# Patient Record
Sex: Female | Born: 1935 | Race: White | Hispanic: No | Smoking: Former smoker
Health system: Southern US, Community
[De-identification: ages and names within clinical notes are randomized; demographics above are authoritative.]

## PROBLEM LIST (undated history)

## (undated) DIAGNOSIS — J45909 Unspecified asthma, uncomplicated: Secondary | ICD-10-CM

## (undated) DIAGNOSIS — I219 Acute myocardial infarction, unspecified: Secondary | ICD-10-CM

## (undated) DIAGNOSIS — J449 Chronic obstructive pulmonary disease, unspecified: Secondary | ICD-10-CM

## (undated) DIAGNOSIS — I509 Heart failure, unspecified: Secondary | ICD-10-CM

## (undated) HISTORY — PX: ABDOMINAL HYSTERECTOMY: SHX81

## (undated) HISTORY — DX: Heart failure, unspecified: I50.9

## (undated) HISTORY — PX: BREAST SURGERY: SHX581

## (undated) HISTORY — PX: TONSILLECTOMY: SUR1361

## (undated) HISTORY — PX: CHOLECYSTECTOMY: SHX55

## (undated) HISTORY — PX: CORONARY ARTERY BYPASS GRAFT: SHX141

---

## 2005-01-14 ENCOUNTER — Emergency Department: Payer: Self-pay | Admitting: Emergency Medicine

## 2005-09-10 HISTORY — PX: BYPASS GRAFT: SHX909

## 2005-10-08 ENCOUNTER — Ambulatory Visit (HOSPITAL_COMMUNITY): Admission: RE | Admit: 2005-10-08 | Discharge: 2005-10-08 | Payer: Self-pay | Admitting: Family Medicine

## 2005-10-22 ENCOUNTER — Ambulatory Visit: Payer: Self-pay | Admitting: Oncology

## 2005-11-02 ENCOUNTER — Ambulatory Visit: Payer: Self-pay | Admitting: Internal Medicine

## 2005-11-06 ENCOUNTER — Ambulatory Visit: Payer: Self-pay | Admitting: Otolaryngology

## 2005-11-06 ENCOUNTER — Other Ambulatory Visit: Payer: Self-pay

## 2005-11-15 ENCOUNTER — Ambulatory Visit: Payer: Self-pay | Admitting: Otolaryngology

## 2006-03-24 ENCOUNTER — Inpatient Hospital Stay: Payer: Self-pay | Admitting: Cardiovascular Disease

## 2006-03-24 ENCOUNTER — Other Ambulatory Visit: Payer: Self-pay

## 2006-05-17 ENCOUNTER — Ambulatory Visit: Payer: Self-pay | Admitting: Oncology

## 2006-12-02 ENCOUNTER — Ambulatory Visit: Payer: Self-pay | Admitting: Oncology

## 2006-12-03 LAB — BASIC METABOLIC PANEL - CANCER CENTER ONLY
BUN, Bld: 19 mg/dL (ref 7–22)
Chloride: 102 mEq/L (ref 98–108)
Potassium: 5.2 mEq/L — ABNORMAL HIGH (ref 3.3–4.7)
Sodium: 136 mEq/L (ref 128–145)

## 2006-12-03 LAB — CBC WITH DIFFERENTIAL (CANCER CENTER ONLY)
BASO#: 0.1 10*3/uL (ref 0.0–0.2)
Eosinophils Absolute: 0.1 10*3/uL (ref 0.0–0.5)
LYMPH#: 4.4 10*3/uL — ABNORMAL HIGH (ref 0.9–3.3)
MCV: 105 fL — ABNORMAL HIGH (ref 81–101)
NEUT%: 45.6 % (ref 39.6–80.0)
Platelets: 257 10*3/uL (ref 145–400)
RBC: 4.48 10*6/uL (ref 3.70–5.32)
RDW: 11.7 % (ref 10.5–14.6)

## 2006-12-09 ENCOUNTER — Ambulatory Visit (HOSPITAL_COMMUNITY): Admission: RE | Admit: 2006-12-09 | Discharge: 2006-12-09 | Payer: Self-pay | Admitting: Oncology

## 2006-12-21 ENCOUNTER — Inpatient Hospital Stay: Payer: Self-pay | Admitting: Internal Medicine

## 2006-12-21 ENCOUNTER — Other Ambulatory Visit: Payer: Self-pay

## 2007-07-22 ENCOUNTER — Ambulatory Visit: Payer: Self-pay | Admitting: Oncology

## 2007-07-24 LAB — COMPREHENSIVE METABOLIC PANEL
Albumin: 4.3 g/dL (ref 3.5–5.2)
Alkaline Phosphatase: 74 U/L (ref 39–117)
Calcium: 9.3 mg/dL (ref 8.4–10.5)
Chloride: 105 mEq/L (ref 96–112)
Glucose, Bld: 103 mg/dL — ABNORMAL HIGH (ref 70–99)
Potassium: 5 mEq/L (ref 3.5–5.3)
Sodium: 139 mEq/L (ref 135–145)
Total Protein: 7.2 g/dL (ref 6.0–8.3)

## 2007-07-24 LAB — CBC WITH DIFFERENTIAL (CANCER CENTER ONLY)
EOS%: 2.9 % (ref 0.0–7.0)
Eosinophils Absolute: 0.2 10*3/uL (ref 0.0–0.5)
HCT: 42.3 % (ref 34.8–46.6)
HGB: 14.4 g/dL (ref 11.6–15.9)
MCHC: 34.1 g/dL (ref 32.0–36.0)
MCV: 104 fL — ABNORMAL HIGH (ref 81–101)
RBC: 4.07 10*6/uL (ref 3.70–5.32)
RDW: 11.7 % (ref 10.5–14.6)

## 2008-01-29 ENCOUNTER — Ambulatory Visit: Payer: Self-pay | Admitting: Orthopedic Surgery

## 2008-02-20 ENCOUNTER — Ambulatory Visit (HOSPITAL_COMMUNITY): Admission: RE | Admit: 2008-02-20 | Discharge: 2008-02-20 | Payer: Self-pay | Admitting: Neurosurgery

## 2008-04-02 ENCOUNTER — Observation Stay (HOSPITAL_COMMUNITY): Admission: RE | Admit: 2008-04-02 | Discharge: 2008-04-02 | Payer: Self-pay | Admitting: Neurosurgery

## 2008-11-02 ENCOUNTER — Encounter: Admission: RE | Admit: 2008-11-02 | Discharge: 2008-11-02 | Payer: Self-pay | Admitting: Neurosurgery

## 2008-11-18 ENCOUNTER — Ambulatory Visit (HOSPITAL_COMMUNITY): Admission: RE | Admit: 2008-11-18 | Discharge: 2008-11-19 | Payer: Self-pay | Admitting: Neurosurgery

## 2009-02-16 ENCOUNTER — Encounter: Admission: RE | Admit: 2009-02-16 | Discharge: 2009-02-16 | Payer: Self-pay | Admitting: Neurosurgery

## 2009-03-01 ENCOUNTER — Ambulatory Visit (HOSPITAL_COMMUNITY): Admission: RE | Admit: 2009-03-01 | Discharge: 2009-03-02 | Payer: Self-pay | Admitting: Neurosurgery

## 2009-03-15 ENCOUNTER — Encounter: Admission: RE | Admit: 2009-03-15 | Discharge: 2009-03-15 | Payer: Self-pay | Admitting: Neurosurgery

## 2009-09-03 ENCOUNTER — Emergency Department: Payer: Self-pay | Admitting: Emergency Medicine

## 2009-10-23 ENCOUNTER — Inpatient Hospital Stay: Payer: Self-pay | Admitting: Internal Medicine

## 2009-11-07 ENCOUNTER — Inpatient Hospital Stay: Payer: Self-pay | Admitting: Internal Medicine

## 2010-02-27 IMAGING — RF DG THORACIC SPINE 2V
1 series · 1 of 1 positions shown · non-contrast
Comparison: 02/16/2009

CLINICAL DATA: T7 fracture, vertebroplasty.

THORACIC SPINE - 2 VIEW

[Series 1: run · 1 of 1 slices shown]
[im 1/1]
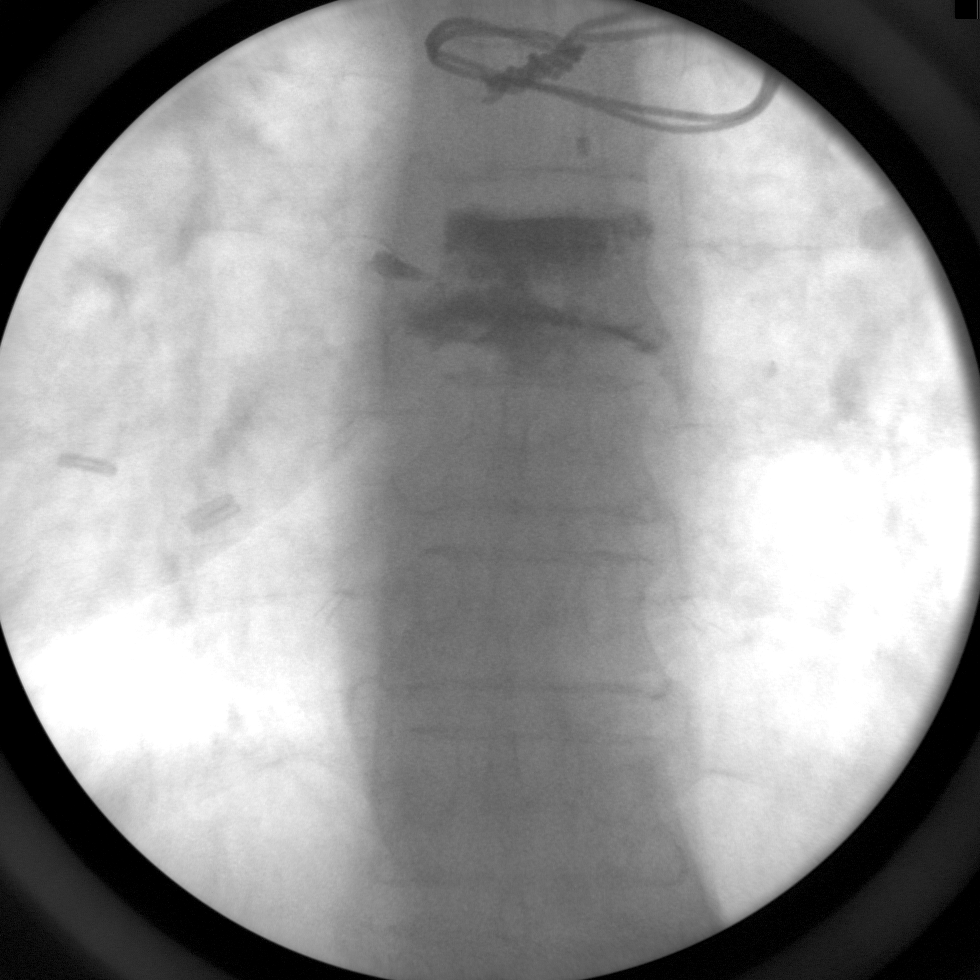

[1 of 1 positions shown; findings below may reference images not displayed]

FINDINGS: Two intraoperative spot images from the mid thoracic
spine are submitted, demonstrating vertebroplasty changes.  Cannot
determine level on these intraoperative spot images.
IMPRESSION: Mid thoracic spine vertebroplasty changes.

## 2010-03-13 IMAGING — CR DG CHEST 2V
2 series · 2 of 2 positions shown · non-contrast
Comparison: 11/16/2008

CLINICAL DATA: Short of breath.  Prior CABG.

CHEST - 2 VIEW

[w chest pa]
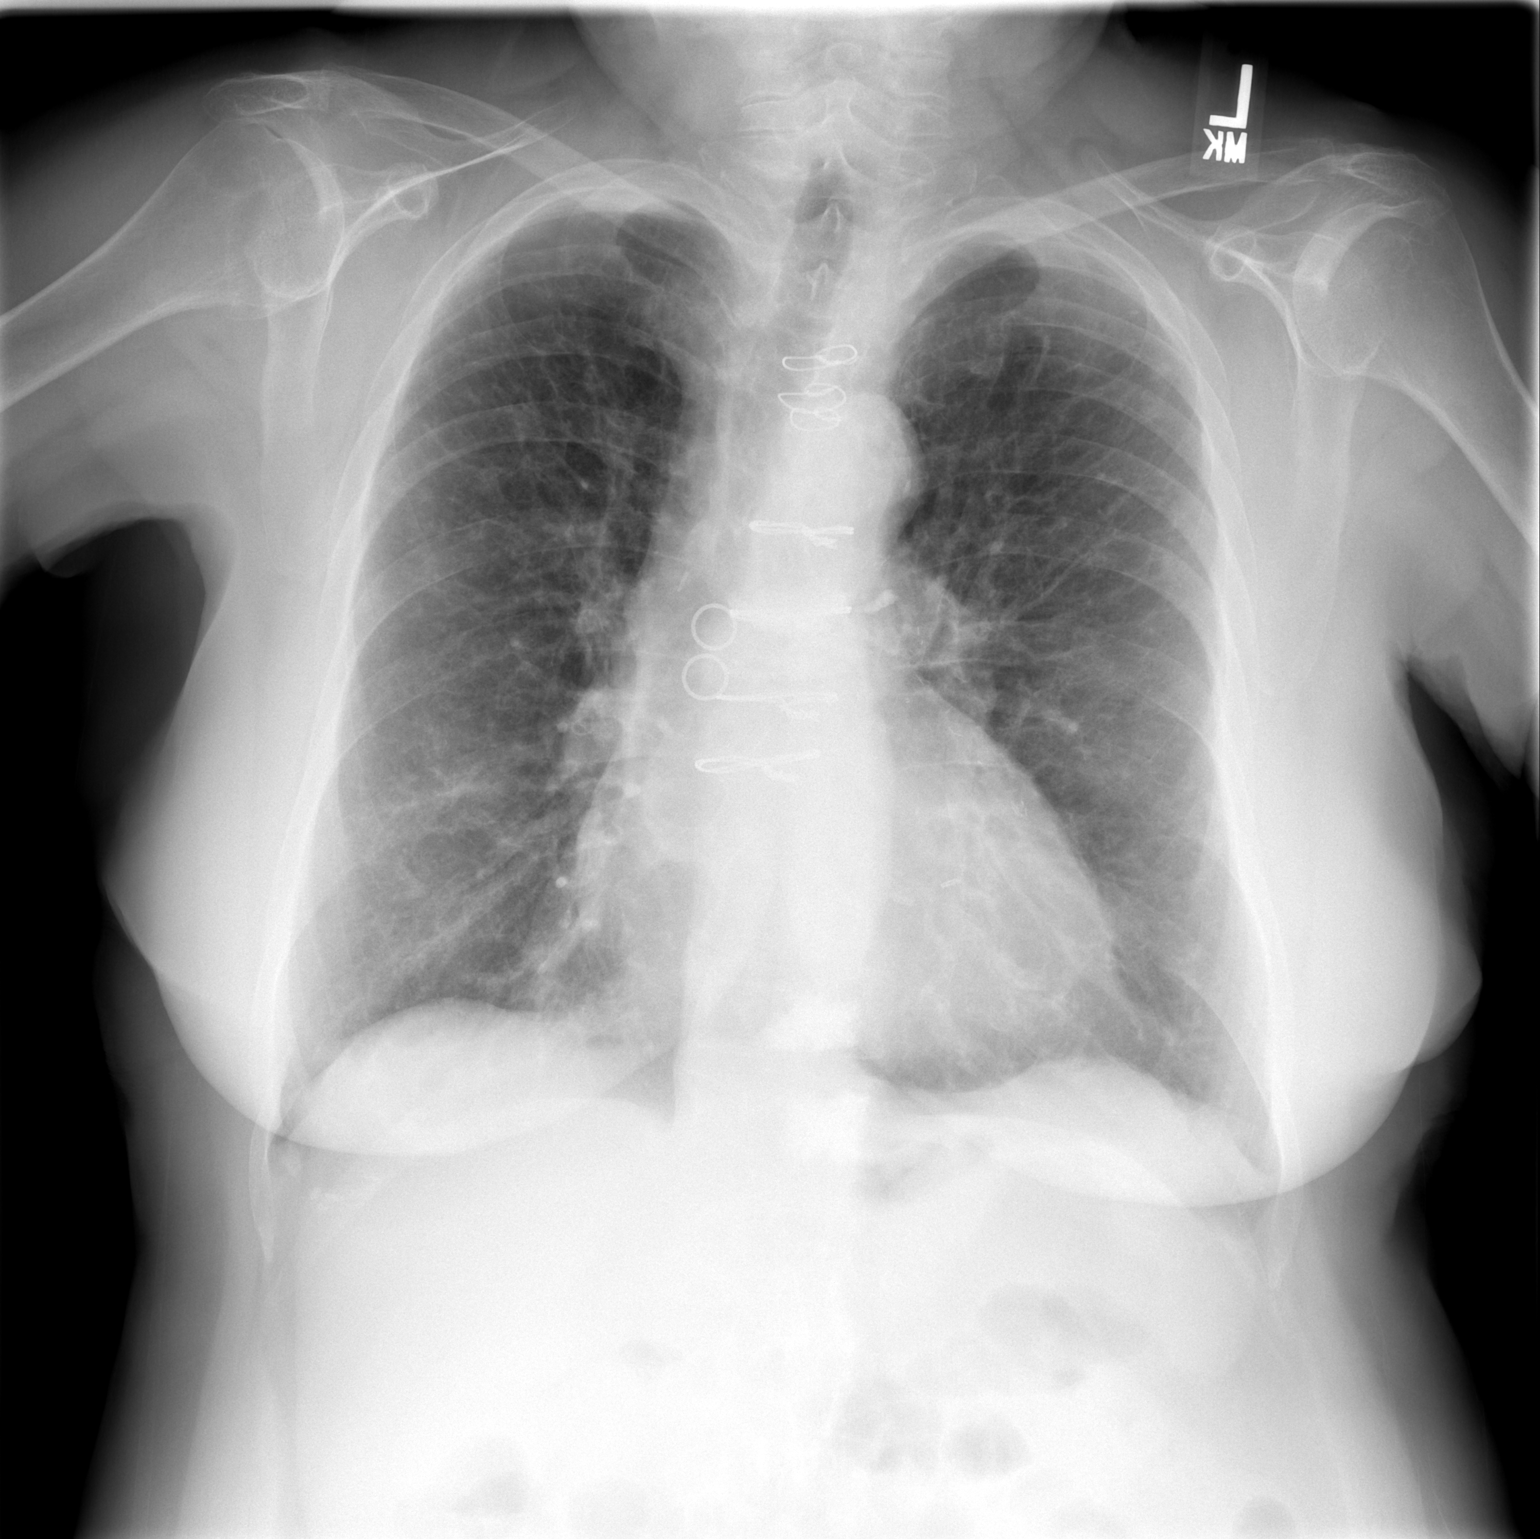

[w chest lat]
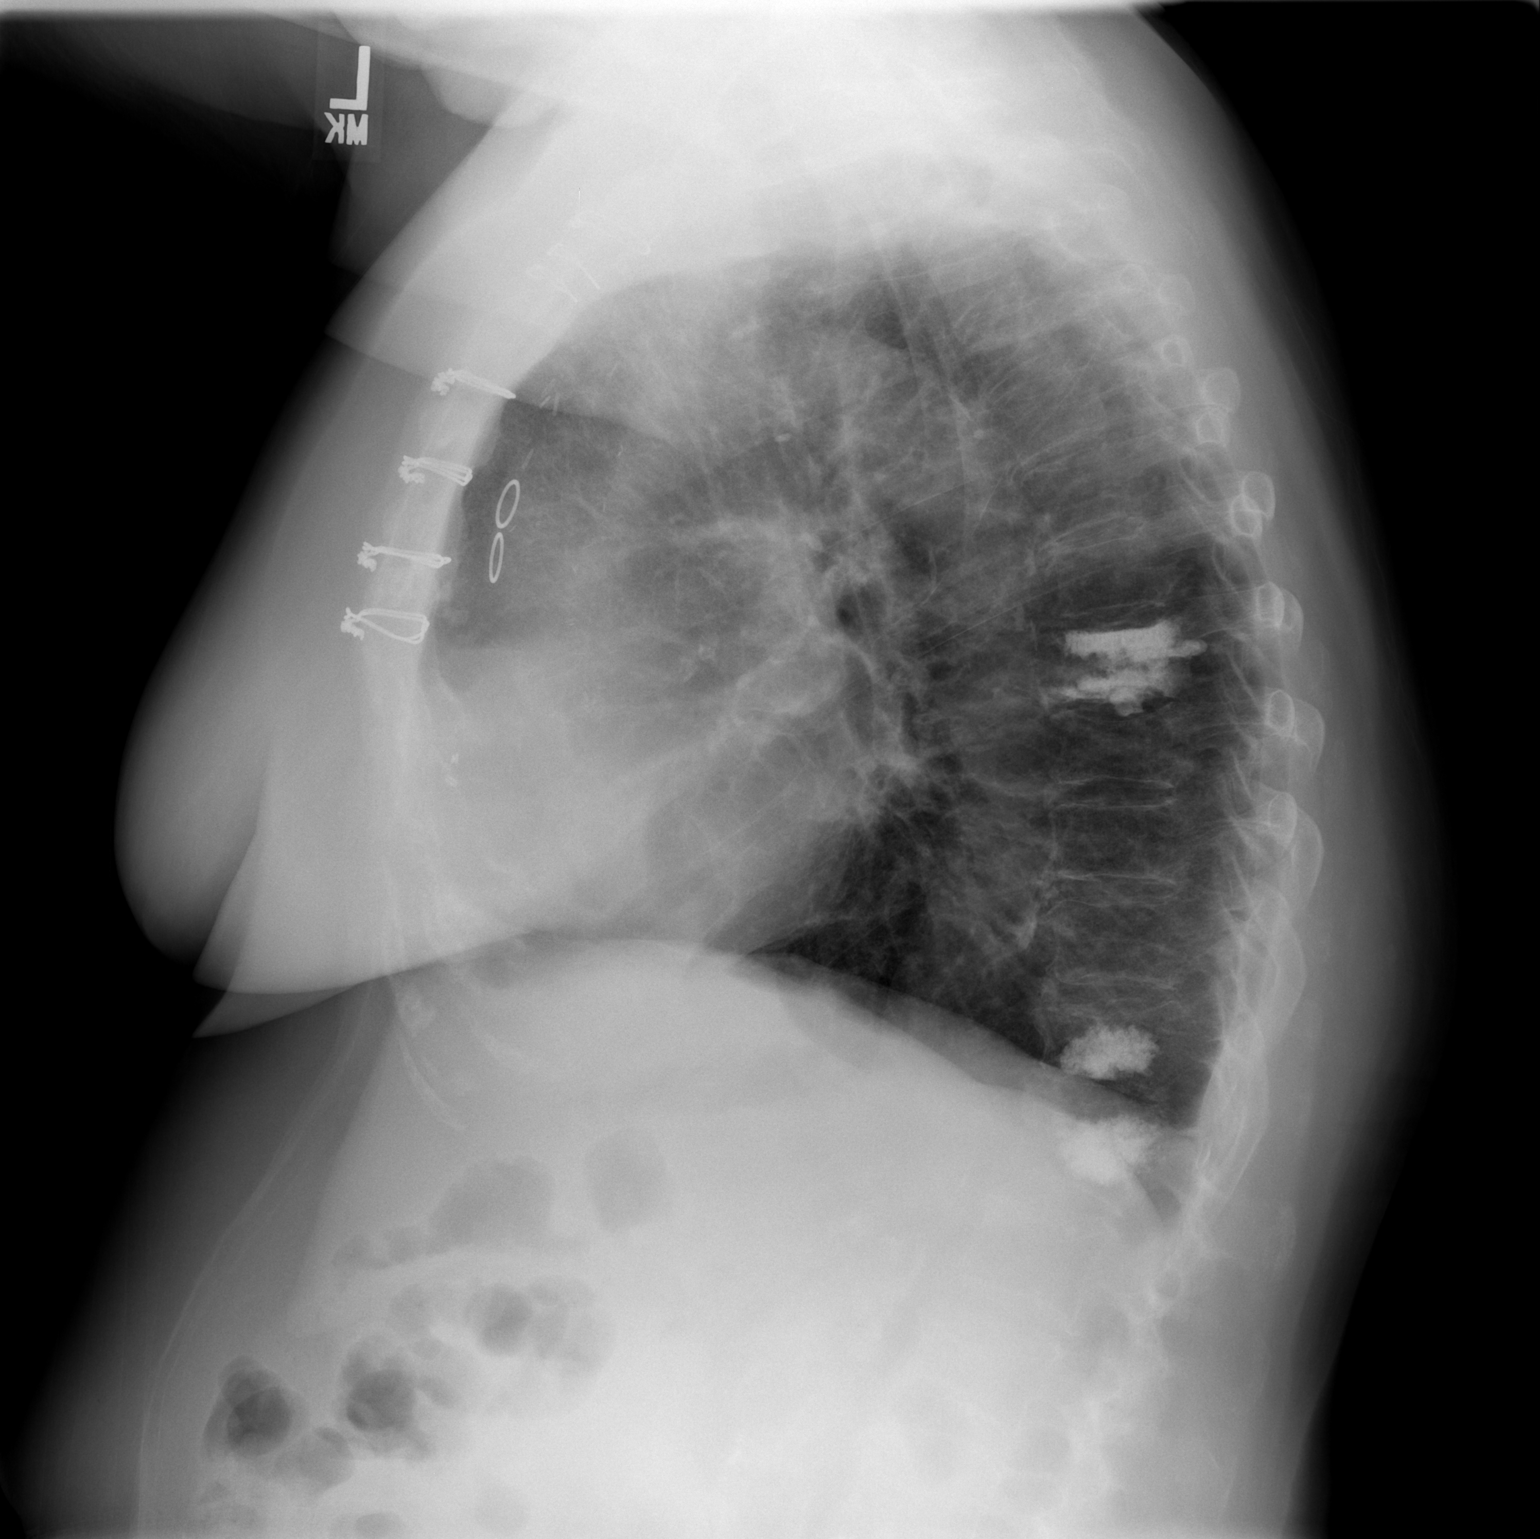

[2 of 2 positions shown; findings below may reference images not displayed]

FINDINGS: There is been previous median sternotomy and CABG.  Heart
size is stable.  Chronic interstitial lung markings are evident.
There appear to be several small pulmonary nodular shadows that I
do not appreciate on the examination [DATE].  These may represent
overlapping shadows related to bronchitis or mild interstitial
edema, but small masses are not excluded.  No definite infiltrate
or collapse.  No apparent effusions.  The patient has had vertebral
augmentation at three levels.
IMPRESSION: Prior CABG.  Interstitial markings are more prominent than were
seen and [REDACTED].  This could be due to bronchitis or mild edema.
Additionally, there is a question of small nodular shadows in both
lungs.  Follow-up is suggested.

## 2010-10-01 ENCOUNTER — Encounter: Payer: Self-pay | Admitting: Family Medicine

## 2010-12-18 LAB — CBC
Hemoglobin: 15.1 g/dL — ABNORMAL HIGH (ref 12.0–15.0)
RBC: 4.06 MIL/uL (ref 3.87–5.11)
WBC: 9.9 10*3/uL (ref 4.0–10.5)

## 2010-12-18 LAB — BASIC METABOLIC PANEL
Calcium: 9.6 mg/dL (ref 8.4–10.5)
Creatinine, Ser: 0.92 mg/dL (ref 0.4–1.2)
GFR calc non Af Amer: 60 mL/min — ABNORMAL LOW (ref >60)
Glucose, Bld: 105 mg/dL — ABNORMAL HIGH (ref 70–99)

## 2010-12-21 LAB — CBC
HCT: 42.5 % (ref 36.0–46.0)
Hemoglobin: 15 g/dL (ref 12.0–15.0)
MCHC: 35.4 g/dL (ref 30.0–36.0)
MCV: 105.7 fL — ABNORMAL HIGH (ref 78.0–100.0)
RDW: 13.1 % (ref 11.5–15.5)

## 2010-12-21 LAB — BASIC METABOLIC PANEL
BUN: 16 mg/dL (ref 6–23)
CO2: 24 mEq/L (ref 19–32)
Chloride: 109 mEq/L (ref 96–112)
GFR calc Af Amer: >60 mL/min (ref >60)
GFR calc non Af Amer: >60 mL/min (ref >60)
Glucose, Bld: 83 mg/dL (ref 70–99)
Potassium: 4.3 mEq/L (ref 3.5–5.1)
Sodium: 140 mEq/L (ref 135–145)

## 2010-12-21 LAB — PROTIME-INR: INR: 1 (ref 0.00–1.49)

## 2011-01-23 NOTE — Op Note (Signed)
[name], [name] NO.:  [id number]   MEDICAL RECORD NO.:  [id number]          PATIENT TYPE:  OIB   LOCATION:  3526                         FACILITY:  [facility]   PHYSICIAN:  Reinaldo Meeker, M.D. DATE OF BIRTH:  [birth year]   DATE OF PROCEDURE:  03/01/2009  DATE OF DISCHARGE:                               OPERATIVE REPORT   PREOPERATIVE DIAGNOSIS:  T7 compression fracture.   POSTOPERATIVE DIAGNOSIS:  T7 compression fracture.   PROCEDURE:  T7 kyphoplasty.   SURGEON:  Reinaldo Meeker, MD   PROCEDURE IN DETAIL:  After being placed in the prone position, the  patient's back was prepped and draped in the usual sterile fashion.  AP  and lateral fluoroscopy was brought into the field.  Appropriate level  and entry points were identified.  The area was infiltrated with local  anesthetic and small stab wound incision made.  A trocar needle was  passed from a superior to inferolateral to medial direction following AP  and lateral fluoroscopy right on the pedicle.  This passed the deep  border of the pedicle.  It went medial to the pedicle on the desired  direction.  Once this was in good position, a small channel was made  with a coring instrument and then the cavity maker was used in turn and  rotated.  The cement delivery system was then secured to the cannula of  the needle and started.  Watching under fluoroscopic guidance in AP and  lateral direction, we saw excellent fill of the vertebra.  We continued  to follow until approximately 5 mL had been placed and excellent fill  across the midline and superior and inferiorly had been obtained.  At  that point, the delivery system was turned off and the cannula slowly  withdrawn.  We tamped down the cement to keep it from being the cement  tail.  We then withdrew the cannula the rest of the way.  We washed the  incision, then closed with two staples, and placed a sterile Band-Aid.  The patient was then extubated and  taken to the recovery room in stable  condition.           ______________________________  Reinaldo Meeker, M.D.     ROK/MEDQ  D:  03/01/2009  T:  03/02/2009  Job:  [id number]

## 2011-01-23 NOTE — Op Note (Signed)
NAMEANISE, HARBIN NO.:  [id number]   MEDICAL RECORD NO.:  [id number]          PATIENT TYPE:  OIB   LOCATION:  3533                         FACILITY:  [facility]   PHYSICIAN:  Reinaldo Meeker, M.D. DATE OF BIRTH:  02-Apr-1933   DATE OF PROCEDURE:  11/18/2008  DATE OF DISCHARGE:                               OPERATIVE REPORT   PREOPERATIVE DIAGNOSIS:  Compression fracture L4.   POSTOPERATIVE DIAGNOSIS:  Compression fracture L4.   PROCEDURE:  L4 kyphoplasty.   SURGEON:  Reinaldo Meeker, MD   PROCEDURE IN DETAIL:  After being placed in the prone position, the  patient's back was prepped and draped in the usual sterile fashion.  AP  and lateral fluoroscopy brought into the field to identify the  appropriate level and entry point.  Small stab wound incision was made  superior and lateral to the L4 pedicle on the left and trocar needle was  then passed from the superior to inferior and lateral to medial  direction down the pedicle of L4 into the mid vertebral body.  This was  done without difficulty.  A wedge was made in the bone with a rotating  cutter to allow filling of the void with the cement.  The cement  delivery system was then attached and tightened and started.  We watched  under AP and lateral fluoroscopy as we obtained excellent fill of the  vertebral body all the way to the midline even across and then nicely in  the superior to inferior direction as well.  When approximately 5 mL  have been placed, we felt to be at maximal optimal fill and we stopped  the filling technique.  The device was slowly withdrawn and the cement  tamped back down without difficulty.  The remaining trocar was then  removed and final fluoroscopy in AP and lateral direction showed  excellent fill and no tail of cement.  The wound was then closed with  three staples and a Band-Aid was placed over it.  The patient was then  extubated and taken to recovery room in stable  condition.           ______________________________  Reinaldo Meeker, M.D.     ROK/MEDQ  D:  11/18/2008  T:  11/19/2008  Job:  [id number]

## 2011-01-23 NOTE — Op Note (Signed)
NAMEJESSIA, KIEF NO.:  [id number]   MEDICAL RECORD NO.:  [id number]          PATIENT TYPE:  OBV   LOCATION:  3523                         FACILITY:  [facility]   PHYSICIAN:  Reinaldo Meeker, M.D. DATE OF BIRTH:  1933-09-10   DATE OF PROCEDURE:  04/02/2008  DATE OF DISCHARGE:  04/02/2008                               OPERATIVE REPORT   PREOPERATIVE DIAGNOSIS:  Compression fracture, T11-T12.   POSTOPERATIVE DIAGNOSIS:  Compression fracture, T11-T12.   PROCEDURE:  T11-T12 vertebroplasty.   SURGEON:  Reinaldo Meeker, MD   PROCEDURE IN DETAIL:  After placed in the prone position, the patient's  back was prepped and draped in the usual sterile fashion.  Localizing  fluoroscopy in AP and lateral direction was brought into good position  to identify the appropriate levels.  Starting at the T12, a small stab  wound incision was made lateral to the pedicle on the left.  The trocar  needle was passed down through slightly superior-to-inferior and lateral-  to-medial direction into excellent position in the vertebral body.  Cement was then mixed appropriately and injected in the standard fashion  with the plunger.  This way, we could sense any evidence of resistance  which we met none.  The cement filling was noted under AP and lateral  fluoroscopy and found to give excellent filling.  The trocar was then  slowly withdrawn and the cement packed down, so this should not leave a  cement tail.  The trocar was then removed.  Similar procedure was then  carried out at T11.  Once again, a small stab incision was made slightly  lateral and superior to the pedicle.  Once again, trocar needle was  passed from lateral-to-medial and superior-to-inferior direction of the  vertebral body of T11, into excellent position without difficulty.  Once  again, the cement was then injected into the vertebral body under a  gentle pressure and no back pressure was noted.  Once again,  excellent  fill was obtained in the AP and lateral directions.  At this time, the  second cannula was withdrawn slowly with packing down of the cement.  It  was then removed fully, and a final fluoroscopy in AP and lateral  directions showed excellent bony fill with the cement.  The wounds were  then closed with staples at each level, and Band-Aids were placed on  each incision.  The patient was then extubated and taken to recovery  room in stable condition.           ______________________________  Reinaldo Meeker, M.D.     ROK/MEDQ  D:  04/02/2008  T:  04/02/2008  Job:  [id number]

## 2011-02-23 ENCOUNTER — Ambulatory Visit: Payer: Self-pay | Admitting: Internal Medicine

## 2011-06-07 LAB — BASIC METABOLIC PANEL
Chloride: 106
GFR calc Af Amer: >60
Potassium: 4.8
Sodium: 141

## 2011-06-07 LAB — CBC
HCT: 43.6
Hemoglobin: 15.1 — ABNORMAL HIGH
MCV: 104.5 — ABNORMAL HIGH
Platelets: 267
RBC: 4.18
RDW: 12.9

## 2011-06-08 LAB — BASIC METABOLIC PANEL
Creatinine, Ser: 0.84
GFR calc non Af Amer: >60
Glucose, Bld: 114 — ABNORMAL HIGH
Potassium: 4.2

## 2011-06-08 LAB — CBC
Hemoglobin: 14.2
MCHC: 34.2
MCV: 105.9 — ABNORMAL HIGH
WBC: 8.4

## 2011-06-18 ENCOUNTER — Ambulatory Visit: Payer: Self-pay | Admitting: Pain Medicine

## 2011-06-25 ENCOUNTER — Ambulatory Visit: Payer: Self-pay | Admitting: Pain Medicine

## 2011-07-23 ENCOUNTER — Ambulatory Visit: Payer: Self-pay | Admitting: Pain Medicine

## 2011-08-07 ENCOUNTER — Ambulatory Visit: Payer: Self-pay | Admitting: Pain Medicine

## 2011-08-15 ENCOUNTER — Ambulatory Visit: Payer: Self-pay | Admitting: Pain Medicine

## 2011-08-22 ENCOUNTER — Ambulatory Visit: Payer: Self-pay | Admitting: Pain Medicine

## 2011-08-30 ENCOUNTER — Ambulatory Visit: Payer: Self-pay | Admitting: Physician Assistant

## 2012-03-24 ENCOUNTER — Ambulatory Visit: Payer: Self-pay | Admitting: Pain Medicine

## 2012-04-09 ENCOUNTER — Ambulatory Visit: Payer: Self-pay | Admitting: Internal Medicine

## 2012-04-17 ENCOUNTER — Ambulatory Visit: Payer: Self-pay | Admitting: Pain Medicine

## 2012-04-28 ENCOUNTER — Ambulatory Visit: Payer: Self-pay | Admitting: Pain Medicine

## 2012-05-05 ENCOUNTER — Ambulatory Visit: Payer: Self-pay | Admitting: Pain Medicine

## 2012-05-13 ENCOUNTER — Ambulatory Visit: Payer: Self-pay | Admitting: Pain Medicine

## 2012-05-20 ENCOUNTER — Ambulatory Visit: Payer: Self-pay | Admitting: Pain Medicine

## 2012-06-19 ENCOUNTER — Ambulatory Visit: Payer: Self-pay | Admitting: Pain Medicine

## 2012-07-02 ENCOUNTER — Ambulatory Visit: Payer: Self-pay | Admitting: Pain Medicine

## 2012-07-17 ENCOUNTER — Ambulatory Visit: Payer: Self-pay | Admitting: Pain Medicine

## 2012-07-30 ENCOUNTER — Ambulatory Visit: Payer: Self-pay | Admitting: Pain Medicine

## 2012-08-18 ENCOUNTER — Ambulatory Visit: Payer: Self-pay | Admitting: Pain Medicine

## 2012-09-16 ENCOUNTER — Ambulatory Visit: Payer: Self-pay | Admitting: Pain Medicine

## 2012-10-16 ENCOUNTER — Ambulatory Visit: Payer: Self-pay | Admitting: Pain Medicine

## 2012-11-13 ENCOUNTER — Ambulatory Visit: Payer: Self-pay | Admitting: Pain Medicine

## 2012-12-16 ENCOUNTER — Ambulatory Visit: Payer: Self-pay | Admitting: Pain Medicine

## 2012-12-31 ENCOUNTER — Ambulatory Visit: Payer: Self-pay | Admitting: Pain Medicine

## 2013-01-12 ENCOUNTER — Ambulatory Visit: Payer: Self-pay | Admitting: Pain Medicine

## 2013-02-12 ENCOUNTER — Ambulatory Visit: Payer: Self-pay | Admitting: Pain Medicine

## 2013-03-17 ENCOUNTER — Ambulatory Visit: Payer: Self-pay | Admitting: Pain Medicine

## 2013-04-30 ENCOUNTER — Ambulatory Visit: Payer: Self-pay | Admitting: Pain Medicine

## 2013-06-09 ENCOUNTER — Ambulatory Visit: Payer: Self-pay | Admitting: Pain Medicine

## 2013-07-14 ENCOUNTER — Ambulatory Visit: Payer: Self-pay | Admitting: Pain Medicine

## 2013-09-21 ENCOUNTER — Ambulatory Visit: Payer: Self-pay | Admitting: Pain Medicine

## 2013-10-20 ENCOUNTER — Ambulatory Visit: Payer: Self-pay | Admitting: Pain Medicine

## 2013-11-19 ENCOUNTER — Ambulatory Visit: Payer: Self-pay | Admitting: Pain Medicine

## 2013-12-15 ENCOUNTER — Ambulatory Visit: Payer: Self-pay | Admitting: Pain Medicine

## 2014-01-12 ENCOUNTER — Ambulatory Visit: Payer: Self-pay | Admitting: Pain Medicine

## 2014-01-13 ENCOUNTER — Emergency Department: Payer: Self-pay

## 2014-01-13 LAB — URINALYSIS, COMPLETE
BLOOD: NEGATIVE
Bacteria: NONE SEEN
Bilirubin,UR: NEGATIVE
Glucose,UR: 50 mg/dL (ref 0–75)
KETONE: NEGATIVE
Nitrite: NEGATIVE
PH: 5 (ref 4.5–8.0)
Protein: 30
RBC,UR: 1 /HPF (ref 0–5)
SPECIFIC GRAVITY: 1.021 (ref 1.003–1.030)
Squamous Epithelial: 27
WBC UR: 1 /HPF (ref 0–5)

## 2014-01-13 LAB — CBC WITH DIFFERENTIAL/PLATELET
BASOS ABS: 0.1 10*3/uL (ref 0.0–0.1)
BASOS PCT: 0.6 %
EOS ABS: 0.2 10*3/uL (ref 0.0–0.7)
EOS PCT: 1.8 %
HCT: 40.4 % (ref 35.0–47.0)
HGB: 13.6 g/dL (ref 12.0–16.0)
LYMPHS PCT: 36.3 %
Lymphocyte #: 3.3 10*3/uL (ref 1.0–3.6)
MCH: 35.9 pg — AB (ref 26.0–34.0)
MCHC: 33.7 g/dL (ref 32.0–36.0)
MCV: 107 fL — ABNORMAL HIGH (ref 80–100)
MONO ABS: 0.7 x10 3/mm (ref 0.2–0.9)
MONOS PCT: 7.2 %
NEUTROS ABS: 5 10*3/uL (ref 1.4–6.5)
Neutrophil %: 54.1 %
Platelet: 227 10*3/uL (ref 150–440)
RBC: 3.79 10*6/uL — ABNORMAL LOW (ref 3.80–5.20)
RDW: 12.8 % (ref 11.5–14.5)
WBC: 9.1 10*3/uL (ref 3.6–11.0)

## 2014-01-13 LAB — BASIC METABOLIC PANEL
Anion Gap: 6 — ABNORMAL LOW (ref 7–16)
BUN: 16 mg/dL (ref 7–18)
CALCIUM: 9.1 mg/dL (ref 8.5–10.1)
CREATININE: 1.44 mg/dL — AB (ref 0.60–1.30)
Chloride: 101 mmol/L (ref 98–107)
Co2: 32 mmol/L (ref 21–32)
GFR CALC AF AMER: 40 — AB
GFR CALC NON AF AMER: 34 — AB
Glucose: 103 mg/dL — ABNORMAL HIGH (ref 65–99)
OSMOLALITY: 279 (ref 275–301)
POTASSIUM: 4 mmol/L (ref 3.5–5.1)
SODIUM: 139 mmol/L (ref 136–145)

## 2014-01-13 LAB — TROPONIN I: Troponin-I: <0.02 ng/mL

## 2014-02-16 ENCOUNTER — Ambulatory Visit: Payer: Self-pay | Admitting: Pain Medicine

## 2014-03-18 ENCOUNTER — Ambulatory Visit: Payer: Self-pay | Admitting: Pain Medicine

## 2014-04-15 ENCOUNTER — Ambulatory Visit: Payer: Self-pay | Admitting: Pain Medicine

## 2014-05-13 ENCOUNTER — Ambulatory Visit: Payer: Self-pay | Admitting: Pain Medicine

## 2014-06-15 ENCOUNTER — Ambulatory Visit: Payer: Self-pay | Admitting: Pain Medicine

## 2014-09-13 ENCOUNTER — Inpatient Hospital Stay: Payer: Self-pay | Admitting: Internal Medicine

## 2014-09-13 LAB — CBC WITH DIFFERENTIAL/PLATELET
Basophil #: 0 10*3/uL (ref 0.0–0.1)
Basophil %: 0.4 %
EOS PCT: 0.8 %
Eosinophil #: 0.1 10*3/uL (ref 0.0–0.7)
HCT: 36.4 % (ref 35.0–47.0)
HGB: 12 g/dL (ref 12.0–16.0)
LYMPHS ABS: 1.2 10*3/uL (ref 1.0–3.6)
Lymphocyte %: 19.3 %
MCH: 35.6 pg — ABNORMAL HIGH (ref 26.0–34.0)
MCHC: 33.1 g/dL (ref 32.0–36.0)
MCV: 108 fL — AB (ref 80–100)
MONOS PCT: 10.2 %
Monocyte #: 0.6 x10 3/mm (ref 0.2–0.9)
Neutrophil #: 4.2 10*3/uL (ref 1.4–6.5)
Neutrophil %: 69.3 %
Platelet: 159 10*3/uL (ref 150–440)
RBC: 3.38 10*6/uL — ABNORMAL LOW (ref 3.80–5.20)
RDW: 12.9 % (ref 11.5–14.5)
WBC: 6.1 10*3/uL (ref 3.6–11.0)

## 2014-09-13 LAB — URINALYSIS, COMPLETE
BLOOD: NEGATIVE
Bilirubin,UR: NEGATIVE
Glucose,UR: NEGATIVE mg/dL (ref 0–75)
KETONE: NEGATIVE
Leukocyte Esterase: NEGATIVE
NITRITE: NEGATIVE
PH: 5 (ref 4.5–8.0)
Protein: 30
RBC,UR: 3 /HPF (ref 0–5)
SPECIFIC GRAVITY: 1.027 (ref 1.003–1.030)
Squamous Epithelial: 1
WBC UR: 5 /HPF (ref 0–5)

## 2014-09-13 LAB — COMPREHENSIVE METABOLIC PANEL
ALBUMIN: 2.9 g/dL — AB (ref 3.4–5.0)
Alkaline Phosphatase: 49 U/L
Anion Gap: 7 (ref 7–16)
BUN: 11 mg/dL (ref 7–18)
Bilirubin,Total: 0.4 mg/dL (ref 0.2–1.0)
Calcium, Total: 7.9 mg/dL — ABNORMAL LOW (ref 8.5–10.1)
Chloride: 107 mmol/L (ref 98–107)
Co2: 25 mmol/L (ref 21–32)
Creatinine: 1.38 mg/dL — ABNORMAL HIGH (ref 0.60–1.30)
EGFR (Non-African Amer.): 39 — ABNORMAL LOW
GFR CALC AF AMER: 48 — AB
GLUCOSE: 130 mg/dL — AB (ref 65–99)
OSMOLALITY: 279 (ref 275–301)
POTASSIUM: 3.6 mmol/L (ref 3.5–5.1)
SGOT(AST): 20 U/L (ref 15–37)
SGPT (ALT): 14 U/L
SODIUM: 139 mmol/L (ref 136–145)
Total Protein: 6.2 g/dL — ABNORMAL LOW (ref 6.4–8.2)

## 2014-09-13 LAB — PHOSPHORUS: PHOSPHORUS: 3.1 mg/dL (ref 2.5–4.9)

## 2014-09-13 LAB — PROTIME-INR
INR: 1.1
Prothrombin Time: 14 secs (ref 11.5–14.7)

## 2014-09-13 LAB — MAGNESIUM: MAGNESIUM: 1.4 mg/dL — AB

## 2014-09-13 LAB — TROPONIN I: Troponin-I: <0.02 ng/mL

## 2014-09-14 LAB — BASIC METABOLIC PANEL
ANION GAP: 5 — AB (ref 7–16)
BUN: 9 mg/dL (ref 7–18)
CREATININE: 1.17 mg/dL (ref 0.60–1.30)
Calcium, Total: 7.5 mg/dL — ABNORMAL LOW (ref 8.5–10.1)
Chloride: 109 mmol/L — ABNORMAL HIGH (ref 98–107)
Co2: 28 mmol/L (ref 21–32)
EGFR (African American): 57 — ABNORMAL LOW
GFR CALC NON AF AMER: 47 — AB
Glucose: 151 mg/dL — ABNORMAL HIGH (ref 65–99)
Osmolality: 285 (ref 275–301)
Potassium: 4.5 mmol/L (ref 3.5–5.1)
SODIUM: 142 mmol/L (ref 136–145)

## 2014-09-14 LAB — CBC WITH DIFFERENTIAL/PLATELET
BASOS PCT: 0.4 %
Basophil #: 0 10*3/uL (ref 0.0–0.1)
Eosinophil #: 0 10*3/uL (ref 0.0–0.7)
Eosinophil %: 0.1 %
HCT: 35 % (ref 35.0–47.0)
HGB: 11.7 g/dL — AB (ref 12.0–16.0)
Lymphocyte #: 0.7 10*3/uL — ABNORMAL LOW (ref 1.0–3.6)
Lymphocyte %: 13.9 %
MCH: 35.7 pg — AB (ref 26.0–34.0)
MCHC: 33.3 g/dL (ref 32.0–36.0)
MCV: 107 fL — AB (ref 80–100)
MONO ABS: 0 x10 3/mm — AB (ref 0.2–0.9)
Monocyte %: 0.8 %
NEUTROS ABS: 4 10*3/uL (ref 1.4–6.5)
Neutrophil %: 84.8 %
Platelet: 159 10*3/uL (ref 150–440)
RBC: 3.27 10*6/uL — ABNORMAL LOW (ref 3.80–5.20)
RDW: 13 % (ref 11.5–14.5)
WBC: 4.8 10*3/uL (ref 3.6–11.0)

## 2014-09-14 LAB — MAGNESIUM: MAGNESIUM: 1.9 mg/dL

## 2014-09-15 LAB — PRO B NATRIURETIC PEPTIDE: B-Type Natriuretic Peptide: 3781 pg/mL — ABNORMAL HIGH (ref 0–450)

## 2014-09-15 LAB — URINE CULTURE

## 2014-09-16 DIAGNOSIS — R0602 Shortness of breath: Secondary | ICD-10-CM

## 2014-09-17 LAB — BASIC METABOLIC PANEL
ANION GAP: 1 — AB (ref 7–16)
BUN: 20 mg/dL — AB (ref 7–18)
Calcium, Total: 8.6 mg/dL (ref 8.5–10.1)
Chloride: 98 mmol/L (ref 98–107)
Co2: 41 mmol/L (ref 21–32)
Creatinine: 1.09 mg/dL (ref 0.60–1.30)
EGFR (African American): >60
EGFR (Non-African Amer.): 51 — ABNORMAL LOW
Glucose: 97 mg/dL (ref 65–99)
OSMOLALITY: 282 (ref 275–301)
Potassium: 3.8 mmol/L (ref 3.5–5.1)
Sodium: 140 mmol/L (ref 136–145)

## 2014-09-18 LAB — CULTURE, BLOOD (SINGLE)

## 2015-01-09 NOTE — Discharge Summary (Signed)
Dates of Admission and Diagnosis:  Date of Admission 13-Sep-2014   Date of Discharge 17-Sep-2014   Admitting Diagnosis SOB   Final Diagnosis 1. Acute resp failure 2. Acute on chronic diastolic chf 3. chronic obstructive pulmonary disease exacerbation 4. Septic shock 5. Tobacco abuse 6. Bilateral pneumonia    Chief Complaint/History of Present Illness PRESENTING COMPLAINT:  Patient is sent by primary care provider due to hypoxia in the outpatient setting with oxygen saturation of 80%.   HISTORY OF PRESENT ILLNESS: This very pleasant 81 year old woman with past medical history of COPD, coronary artery disease, hypertension, and chronic back pain, presents today after being sent from her primary care office with hypoxia. The history is provided by the daughter as the patient is on BiPAP at the time of the interview. The daughter, also named Judit, reports that the patient has been sick for about 5 days. She has had coughing, wheezing, and fatigue. She has been sleeping most of the time. She has not been eating or drinking much at all for this time period. She saw her primary care provider 3 days ago and was started on the Z-Pak with no relief. She has not had any nausea, vomiting, diarrhea. No fevers or chills noted. She has also been taking over-the-counter Alka-Seltzer Cold Relief. Of note she also had 9 teeth pulled on 12/21, some of these were broken at that gum and had to be removed surgically.   Allergies:  Codeine: Rash  Pertinent Past History:  Pertinent Past History PAST MEDICAL HISTORY: 1.  Chronic obstructive pulmonary disease, formally on home oxygen but not for the past 2 to 3 years.  2.  Coronary artery disease, status post coronary artery bypass grafting.   PAST SURGICAL HISTORY: 1.  Cholecystectomy.  2.  Appendectomy.  3.  Back surgery.  4.  Tonsillectomy and adenoidectomy.  5.  Coronary artery bypass grafting.  6.  Partial hysterectomy.   Hospital Course:   Hospital Course 71 f with chronic obstructive pulmonary disease, tobacco abuse, coronary artery disease here with SOB  * Acute respiratory failure, hypoxic and hypercarbic with bilateral pneumonitis and chronic obstructive pulmonary disease exacerbation Nebs, Abx. Changed to PO prednisone Changed Abx to levaquin Wean O2 as tolerated at rehab.  * Acute on chronic diastolic chf BNP elevated. Echo shows normal EF Was on IV lasix. Continue '20mg'$  daily after discharge  * CAD- stable  * Hypertension Restarted meds  * Tobacco absue Counselled. Nicotine patch.  Time spent on discharge 40 minutes   Condition on Discharge Fair   Code Status:  Code Status No Code/Do Not Resuscitate   PHYSICAL EXAM ON DISCHARGE:  Physical Exam:  GEN no acute distress   HEENT pink conjunctivae   NECK supple  No masses   RESP normal resp effort  clear BS   CARD regular rate   ABD denies tenderness  soft  normal BS   LYMPH negative neck   VITAL SIGNS:  Vital Signs: **Vital Signs.:   08-Jan-16 08:50  Pulse Ox % Pulse Ox % 86  Pulse Ox Activity Level  At rest  Oxygen Delivery Room Air/ 21 %    08:53  Vital Signs Type Routine  Temperature Temperature (F) 97.9  Celsius 36.6  Temperature Source oral  Pulse Pulse 75  Respirations Respirations 18  Systolic BP Systolic BP 643  Diastolic BP (mmHg) Diastolic BP (mmHg) 76  Mean BP 100  Pulse Ox % Pulse Ox % 94  Pulse Ox Activity Level  At rest  Oxygen Delivery 2L; Nasal Cannula   DISCHARGE INSTRUCTIONS HOME MEDS:  Medication Reconciliation: Patient's Home Medications at Discharge:     Medication Instructions  trazodone 50 mg oral tablet  1-2 tab(s) orally once a day (at bedtime), As Needed - for Inability to Sleep   hydrochlorothiazide-losartan 12.5 mg-50 mg oral tablet  1 tab(s) orally once a day   losartan 50 mg oral tablet  1 tab(s) orally once a day   spiriva 18 mcg inhalation capsule  1 cap(s) inhaled once a day   atorvastatin 10  mg oral tablet  1 tab(s) orally once a day (in the evening)   albuterol 2.5 mg/3 ml (0.083%) inhalation solution  3 milliliter(s) inhaled 4 times a day   pantoprazole 40 mg oral delayed release tablet  1 tab(s) orally once a day   clopidogrel 75 mg oral tablet  1 tab(s) orally once a day   myrbetriq 50 mg oral tablet, extended release  1 tab(s) orally once a day   oystercal-d 500 mg-125 units oral tablet  1 tab(s) orally 2 times a day   acetaminophen-hydrocodone 325 mg-7.5 mg oral tablet  1 tab(s) orally every 6 hours, As Needed - for Pain   alprazolam 0.5 mg tablet  1 tab(s) orally 2 times a day, As Needed - for Anxiety, Nervousness   nicotine 14 mg/24 hr transdermal film, extended release  1 patch transdermal once a day   levofloxacin 750 mg oral tablet  1 tab(s) orally every 48 hours   furosemide 20 mg oral tablet  1 tab(s) orally once a day   prednisone 10 mg oral tablet  Start at 60 mg and taper by 10 mg daily until complete    STOP TAKING THE FOLLOWING MEDICATION(S):    methylprednisolone 4 mg oral tablet: tab(s) orally as directed  azithromycin 5 day dose pack 250 mg oral tablet: 2 tab(s) orally once a day on day 1 then 1 a day for the next 4 days.   Physician's Instructions:  Home Oxygen? Yes   Oxygen delivery at home: 2L  Nasal Cannula   Diet Low Sodium   Activity Limitations As tolerated   Return to Work Not Applicable   Time frame for Follow Up Appointment 1-2 days  Physician at rehab   Other Comments QUIT SMOKING Stop levaquin on 09/21/2014.   Electronic Signatures: Alba Destine (MD)  (Signed 08-Jan-16 11:41)  Authored: ADMISSION DATE AND DIAGNOSIS, CHIEF COMPLAINT/HPI, Allergies, PERTINENT PAST HISTORY, HOSPITAL COURSE, PHYSICAL EXAM ON DISCHARGE, VITAL SIGNS, DISCHARGE INSTRUCTIONS HOME MEDS, PATIENT INSTRUCTIONS   Last Updated: 08-Jan-16 11:41 by Alba Destine (MD)

## 2015-01-09 NOTE — H&P (Signed)
PATIENT NAME:  Debra [name], Debra [name] MR#:  [id number] DATE OF BIRTH:  Jan 30, 1933  DATE OF ADMISSION:  09/13/2014  REFERRING EMERGENCY ROOM PHYSICIAN:   Wynonia Lawman, MD   PRESENTING COMPLAINT:  Patient is sent by primary care provider due to hypoxia in the outpatient setting with oxygen saturation of 80%.   HISTORY OF PRESENT ILLNESS: This very pleasant 81 year old woman with past medical history of COPD, coronary artery disease, hypertension, and chronic back pain, presents today after being sent from her primary care office with hypoxia. The history is provided by the daughter as the patient is on BiPAP at the time of the interview. The daughter, also named Debra [name], reports that the patient has been sick for about 5 days. She has had coughing, wheezing, and fatigue. She has been sleeping most of the time. She has not been eating or drinking much at all for this time period. She saw her primary care provider 3 days ago and was started on the Z-Pak with no relief. She has not had any nausea, vomiting, diarrhea. No fevers or chills noted. She has also been taking over-the-counter Alka-Seltzer Cold Relief. Of note she also had 9 teeth pulled on 12/21, some of these were broken at that gum and had to be removed surgically.   PAST MEDICAL HISTORY: 1.  Chronic obstructive pulmonary disease, formally on home oxygen but not for the past 2 to 3 years.  2.  Coronary artery disease, status post coronary artery bypass grafting.   PAST SURGICAL HISTORY: 1.  Cholecystectomy.  2.  Appendectomy.  3.  Back surgery.  4.  Tonsillectomy and adenoidectomy.  5.  Coronary artery bypass grafting.  6.  Partial hysterectomy.   SOCIAL HISTORY: The patient lives alone. She uses a walker for mobility. She is a current 1 pack per day smoker. She does not drink alcohol or use any illicit substances.   FAMILY HISTORY: Positive for coronary artery disease and diabetes in her mother. Her father died of pneumonia. No history of  stroke. Several of her siblings have died of coronary artery disease. Several of her siblings have had cancer, but she does not know the primary.   ALLERGIES: SHE IS ALLERGIC TO CODEINE WHICH CAUSES RASH.   HOME MEDICATIONS: 1.  Trazodone 50 mg 1 to 2 tablets once a day at bedtime.  2.  Spiriva 18 mcg 1 capsule inhaled daily.  3.  ProAir HFA CFC free 90 mcg/inhalation 2 puffs inhaled every 4 hours as needed for shortness of breath.  4.  Pantoprazole 40 mg 1 tablet once a day.  5.  Oyster Cal D 500 mg/125 units 1 tablet twice a day.  6.  Myrbetriq 50 mg 1 tablet once a day.  7.  Prednisone taper started 3 days ago.  8.  Losartan 50 mg 1 tablet once a day.  9.  Hydrochlorothiazide/losartan 12.5/50 mg 1 tablet once a day.  10.  Clopidogrel  75 mg 1 tablet once a day.  11.  Azithromycin 5 day Dosepak, she is on day 4.   12.  Atorvastatin 10 mg 1 tablet once a day.  13.  Alprazolam 0.5 mg 1 tablet twice a day.  14.  Albuterol 2.5 mg/3 mL, 3 mL inhaled 4 times a day as needed for shortness of breath.  15.  Acetaminophen/hydrocodone 325/7.5 mg 1 tablet every 4 hours as needed for pain, 325/5 mg, 0.5 to 1 tablet orally once a day as needed for pain.   REVIEW OF SYSTEMS:  Unable to obtain the review of systems as the patient is on BiPAP in respiratory distress at this time.   PHYSICAL EXAMINATION: VITAL SIGNS: Temperature 97.5, pulse 84, respirations 17, blood pressure 109/71, oxygenation 100% on BiPAP; presenting vitals, temperature 97.5, pulse 85, blood pressure 73/51; oxygen saturation 81% on room air.  GENERAL: The patient appears acutely ill.  HEENT: Pupils equal, round, and reactive to light, conjunctivae clear, extraocular motion intact; oral mucosa is dry, I cannot see the posterior oropharynx due to BiPAP mask.  NECK: Supple. There is some bruising on the left neck after dental surgery, no cervical lymphadenopathy, no hematoma.  RESPIRATORY: There are loud wheezes throughout all lung  fields, fair air movement.  CARDIOVASCULAR: Tachycardic, regular. No murmurs, rubs, or gallops. No peripheral edema. Peripheral pulses are very diminished 1+, at best.  ABDOMEN: Soft, nontender, nondistended. No guarding, no rebound, no hepatosplenomegaly or mass. Bowel sounds are decreased.  MUSCULOSKELETAL: No joint effusions, range of motion is normal, strength is diminished throughout at 4/4.  NEUROLOGIC: Cranial nerves II through XII grossly intact. Strength and sensation are diffusely diminished, nonfocal. She is alert and oriented.  PSYCHIATRIC: The patient is calm, she is alert and oriented and answering questions appropriately at this time, though her ability to respond is limited due to the BiPAP mask.   LABORATORY: Sodium 139, potassium 3.6, chloride 107, bicarbonate 25, BUN 11, creatinine 1.38, phosphorus 3.1, magnesium 1.4, total protein 6.2, albumin 2.9. Other LFTs are normal. Troponin is less than 0.02, white blood cells 6.1, hemoglobin 12.0, platelets 159,000, MCV is 108,000. UA is negative for signs of infection. ABG shows a pH of 7.3, pCO2 of 55.   IMAGING: Chest x-ray shows interstitial change to some degree likely representing chronic interstitial lung disease. There is increase in conspicuity from 10/28/2009; there may be superimposed component of interstitial pulmonary edema.   ASSESSMENT AND PLAN: 1.  Sepsis: The patient meets septic criteria with hypotension, hypoxia. Blood cultures are pending. Urine is negative for signs of infection, but urine is not frankly positive for infection. Chest x-ray does not show a frank pneumonia though she has had upper respiratory tract symptoms for the past week. We will go ahead and treat with broad-spectrum antibiotics including vancomycin and Zosyn. Await culture results to tailor antibiotic therapy. Note that she has also had some recent dental work which could be a source. She has received 2.5 liters of normal saline in the Emergency Room  and her blood pressure has improved significantly since then. Continue to monitor inputs and outputs. She will be maintained on 150 mL per hour of normal saline. The patient is a do not resuscitate. She has agreed to central line with pressors if needed to keep her blood pressure up if volume does not work. She is being admitted to the critical care unit.  2.  Chronic obstructive pulmonary disease exacerbation: We will start intravenous steroids, Solu-Medrol 60 mg every 8 hours. Continue with albuterol ipratropium nebulizers every 4 hours while awake. Continue with broad-spectrum antibiotics. She is currently on bilevel positive air pressure  with good oxygen saturation.  3.  Hypomagnesemia. We will replete.  4.  Megaloblastic: Check a B12 level.  5.  Coronary artery disease: She is not having chest pain at this time. We will monitor on telemetry. Her EKG does not show any changes suggestive of acute ischemia at this time. Initial troponin is negative, we will continue to cycle. Continue with statin. Continue with the Plavix. She is not on  a statin and I will not start 1 at this time.  6.  Chronic kidney disease stage 3: Stable.   DISPOSITION: The patient is being admitted to the ICU. Note that she is do not resuscitate. I have discussed this with the patient and her daughter. They do not want cardiopulmonary resuscitation. They do not want intubation and ventilation.   PROPHYLAXIS: Heparin for deep vein thrombosis prophylaxis and Protonix for gastrointestinal prophylaxis.   TIME SPENT ON THIS ADMISSION: Was 55 minutes.    ____________________________ Earleen Newport. Volanda Napoleon, MD cpw:nt D: 09/13/2014 17:36:31 ET T: 09/13/2014 18:02:05 ET JOB#: [id number]  cc: Barnetta Chapel P. Volanda Napoleon, MD, <Dictator> Aldean Jewett MD ELECTRONICALLY SIGNED 09/21/2014 13:18

## 2015-07-13 ENCOUNTER — Emergency Department
Admission: EM | Admit: 2015-07-13 | Discharge: 2015-07-13 | Disposition: A | Discharge status: Home / Self Care | Payer: Medicare Other | Attending: Emergency Medicine | Admitting: Emergency Medicine

## 2015-07-13 ENCOUNTER — Encounter: Payer: Self-pay | Admitting: Emergency Medicine

## 2015-07-13 ENCOUNTER — Emergency Department: Payer: Medicare Other

## 2015-07-13 DIAGNOSIS — Z720 Tobacco use: Secondary | ICD-10-CM | POA: Insufficient documentation

## 2015-07-13 DIAGNOSIS — R5383 Other fatigue: Secondary | ICD-10-CM | POA: Insufficient documentation

## 2015-07-13 DIAGNOSIS — R0602 Shortness of breath: Secondary | ICD-10-CM | POA: Diagnosis present

## 2015-07-13 DIAGNOSIS — J449 Chronic obstructive pulmonary disease, unspecified: Secondary | ICD-10-CM

## 2015-07-13 DIAGNOSIS — J441 Chronic obstructive pulmonary disease with (acute) exacerbation: Secondary | ICD-10-CM | POA: Diagnosis not present

## 2015-07-13 DIAGNOSIS — Z9981 Dependence on supplemental oxygen: Secondary | ICD-10-CM | POA: Diagnosis not present

## 2015-07-13 HISTORY — DX: Chronic obstructive pulmonary disease, unspecified: J44.9

## 2015-07-13 HISTORY — DX: Unspecified asthma, uncomplicated: J45.909

## 2015-07-13 LAB — CBC
HCT: 34.4 % — ABNORMAL LOW (ref 35.0–47.0)
Hemoglobin: 11.7 g/dL — ABNORMAL LOW (ref 12.0–16.0)
MCH: 34.5 pg — AB (ref 26.0–34.0)
MCHC: 34 g/dL (ref 32.0–36.0)
MCV: 101.3 fL — AB (ref 80.0–100.0)
PLATELETS: 260 10*3/uL (ref 150–440)
RBC: 3.4 MIL/uL — AB (ref 3.80–5.20)
RDW: 13.9 % (ref 11.5–14.5)
WBC: 8 10*3/uL (ref 3.6–11.0)

## 2015-07-13 LAB — COMPREHENSIVE METABOLIC PANEL
ALT: 8 U/L — AB (ref 14–54)
ANION GAP: 6 (ref 5–15)
AST: 13 U/L — ABNORMAL LOW (ref 15–41)
Albumin: 3.5 g/dL (ref 3.5–5.0)
Alkaline Phosphatase: 52 U/L (ref 38–126)
BUN: 11 mg/dL (ref 6–20)
CALCIUM: 9 mg/dL (ref 8.9–10.3)
CHLORIDE: 97 mmol/L — AB (ref 101–111)
CO2: 39 mmol/L — ABNORMAL HIGH (ref 22–32)
CREATININE: 1.05 mg/dL — AB (ref 0.44–1.00)
GFR, EST AFRICAN AMERICAN: 57 mL/min — AB (ref >60)
GFR, EST NON AFRICAN AMERICAN: 49 mL/min — AB (ref >60)
Glucose, Bld: 122 mg/dL — ABNORMAL HIGH (ref 65–99)
Potassium: 3.2 mmol/L — ABNORMAL LOW (ref 3.5–5.1)
Sodium: 142 mmol/L (ref 135–145)
Total Bilirubin: 0.6 mg/dL (ref 0.3–1.2)
Total Protein: 7 g/dL (ref 6.5–8.1)

## 2015-07-13 LAB — URINALYSIS COMPLETE WITH MICROSCOPIC (ARMC ONLY)
BILIRUBIN URINE: NEGATIVE
Bacteria, UA: NONE SEEN
GLUCOSE, UA: NEGATIVE mg/dL
Ketones, ur: NEGATIVE mg/dL
Nitrite: NEGATIVE
Protein, ur: NEGATIVE mg/dL
Specific Gravity, Urine: 1.019 (ref 1.005–1.030)
pH: 6 (ref 5.0–8.0)

## 2015-07-13 LAB — TROPONIN I

## 2015-07-13 LAB — TSH: TSH: 0.736 u[IU]/mL (ref 0.350–4.500)

## 2015-07-13 NOTE — ED Provider Notes (Signed)
[facility] Emergency Department Provider Note  ____________________________________________  Time seen: 2047  I have reviewed the triage vital signs and the nursing notes.   HISTORY  Chief Complaint Shortness of Breath     HPI Debra [name] is a 81 y.o. female who presents to the emergency department because, she says, her primary physician told her to come here due to having pneumonia.  The patient saw her primary physician on Monday. She had a chest x-ray. She does have a history of lung disease. They called her on Tuesday with the results of the chest x-ray saying she had "walking pneumonia". No antibiotics were prescribed. The patient was on antibiotics last week. Her doctor's office called back again today and told the patient she should come to the emergency department for further care and evaluation.  The patient does use oxygen at home. She reports no significant change in her pulmonary function. She does report feeling somewhat fatigued the past 2 or 3 days. She denies any fever. She denies any chest pain.    Past Medical History  Diagnosis Date  . COPD (chronic obstructive pulmonary disease) ([place])   . Asthma     There are no active problems to display for this patient.   Past Surgical History  Procedure Laterality Date  . Abdominal hysterectomy    . Cholecystectomy    . Tonsillectomy    . Breast surgery      No current outpatient prescriptions on file.  Allergies Codeine  No family history on file.  Social History Social History  Substance Use Topics  . Smoking status: Current Every Day Smoker  . Smokeless tobacco: None  . Alcohol Use: No    Review of Systems  Constitutional: Negative for fever. ENT: Negative for sore throat. Cardiovascular: Negative for chest pain. Respiratory: History of COPD. No acute changes. Gastrointestinal: Negative for abdominal pain, vomiting and diarrhea. Genitourinary: Negative for  dysuria. Musculoskeletal: No myalgias or injuries. Skin: Negative for rash. Neurological: Negative for headache or focal weakness   10-point ROS otherwise negative.  ____________________________________________   PHYSICAL EXAM:  VITAL SIGNS: ED Triage Vitals  Enc Vitals Group     BP 07/13/15 1711 134/70 mmHg     Pulse Rate 07/13/15 1711 78     Resp 07/13/15 1711 22     Temp 07/13/15 1716 98.1 F (36.7 C)     Temp Source 07/13/15 1716 Oral     SpO2 07/13/15 1711 97 %     Weight 07/13/15 1711 179 lb (81.194 kg)     Height 07/13/15 1711 '5\' 1"'$  (1.549 m)     Head Cir --      Peak Flow --      Pain Score 07/13/15 1712 6     Pain Loc --      Pain Edu? --      Excl. in [place]? --     Constitutional:  Alert and oriented. Appears to have somewhat low energy, mildly fatigued, but interactive and in no distress. ENT   Head: Normocephalic and atraumatic.   Nose: No congestion/rhinnorhea.       Mouth: No erythema, no swelling   Cardiovascular: Normal rate, regular rhythm, no murmur noted Respiratory:  Normal respiratory effort, no tachypnea.    Somewhat distant breath sounds bilaterally. No wheezing noted..  Gastrointestinal: Soft and nontender. No distention.  Back: No muscle spasm, no tenderness, no CVA tenderness. Musculoskeletal: No deformity noted. Nontender with normal range of motion in all extremities.  No noted edema. Neurologic:  Normal appearing spontaneous movement in all 4 extremities. No gross focal neurologic deficits are appreciated.  Skin:  Skin is warm, dry. No rash noted. Psychiatric: Mood and affect are normal. Speech and behavior are normal.  ____________________________________________    LABS (pertinent positives/negatives)  Labs Reviewed  CBC - Abnormal; Notable for the following:    RBC 3.40 (*)    Hemoglobin 11.7 (*)    HCT 34.4 (*)    MCV 101.3 (*)    MCH 34.5 (*)    All other components within normal limits  COMPREHENSIVE METABOLIC PANEL -  Abnormal; Notable for the following:    Potassium 3.2 (*)    Chloride 97 (*)    CO2 39 (*)    Glucose, Bld 122 (*)    Creatinine, Ser 1.05 (*)    AST 13 (*)    ALT 8 (*)    GFR calc non Af Amer 49 (*)    GFR calc Af Amer 57 (*)    All other components within normal limits  URINALYSIS COMPLETEWITH MICROSCOPIC ([facility] ONLY) - Abnormal; Notable for the following:    Color, Urine YELLOW (*)    APPearance CLEAR (*)    Hgb urine dipstick 1+ (*)    Leukocytes, UA TRACE (*)    Squamous Epithelial / LPF 0-5 (*)    All other components within normal limits  TROPONIN I  TSH     ____________________________________________   EKG  ED ECG REPORT I, Rechelle Niebla W, the attending physician, personally viewed and interpreted this ECG.   Date: 07/13/2015  EKG Time: 1720  Rate: 79  Rhythm:  sinus rhythm with premature atrial complexes  Axis: Normal  Intervals: Normal  ST&T Change: None noted   ____________________________________________    RADIOLOGY  FINDINGS: Trachea is midline. Heart size stable. Thoracic aorta is calcified. Lungs are slightly hyperinflated but clear. No pleural fluid. Osteopenia with scattered vertebroplasties and compression deformities.  IMPRESSION: No acute findings.   ____________________________________________   PROCEDURES  ____________________________________________   INITIAL IMPRESSION / ASSESSMENT AND PLAN / ED COURSE  Pertinent labs & imaging results that were available during my care of the patient were reviewed by me and considered in my medical decision making (see chart for details).  Apparently this patient was advised in the emergency department because she had "walking pneumonia". The chest x-ray today does not show any sign of infiltrate. She has a good oxygen saturation level with low-flow oxygen by nasal cannula. Monitor in the soft she still does rather well, showing an O2 sat of 92-93% most of the time. She has no increased  shortness of breath or work of breathing when we turned the oxygen off.  Her blood tests overall look reasonable. Her CO2 level in her metabolic panel is slightly elevated at 39. This is not an unusual level for her. She has a normal white blood cell count. Her cardiac enzyme, troponin, is less than 0.03.  Due to her fatigue I have added on a TSH level. We are also obtaining a urinalysis. Currently, we sat no indication for hospitalization or additional treatment.  ----------------------------------------- 10:34 PM on 07/13/2015 -----------------------------------------  Urinalysis overall is okay with 0-5 white blood cells per field. TSH is normal at 0.736.  Patient overall appears comfortable. We will discharge her home. ____________________________________________   FINAL CLINICAL IMPRESSION(S) / ED DIAGNOSES  Final diagnoses:  Chronic obstructive pulmonary disease, unspecified COPD type ([place])  Other fatigue  Ahmed Prima, MD 07/13/15 [phone]

## 2015-07-13 NOTE — Discharge Instructions (Signed)
Your x-ray appeared okay. Your oxygen saturation level was good. This included brief times when you did not have oxygen on in the emergency department. Continue your oxygen and your regular medicines at home. Follow-up with her regular doctor. Return to the emergency department if you have more shortness of breath or other urgent concerns.  Chronic Obstructive Pulmonary Disease Exacerbation Chronic obstructive pulmonary disease (COPD) is a common lung problem. In COPD, the flow of air from the lungs is limited. COPD exacerbations are times that breathing gets worse and you need extra treatment. Without treatment they can be life threatening. If they happen often, your lungs can become more damaged. If your COPD gets worse, your doctor may treat you with:  Medicines.  Oxygen.  Different ways to clear your airway, such as using a mask. HOME CARE  Do not smoke.  Avoid tobacco smoke and other things that bother your lungs.  If given, take your antibiotic medicine as told. Finish the medicine even if you start to feel better.  Only take medicines as told by your doctor.  Drink enough fluids to keep your pee (urine) clear or pale yellow (unless your doctor has told you not to).  Use a cool mist machine (vaporizer).  If you use oxygen or a machine that turns liquid medicine into a mist (nebulizer), continue to use them as told.  Keep up with shots (vaccinations) as told by your doctor.  Exercise regularly.  Eat healthy foods.  Keep all doctor visits as told. GET HELP RIGHT AWAY IF:  You are very short of breath and it gets worse.  You have trouble talking.  You have bad chest pain.  You have blood in your spit (sputum).  You have a fever.  You keep throwing up (vomiting).  You feel weak, or you pass out (faint).  You feel confused.  You keep getting worse. MAKE SURE YOU:  Understand these instructions.  Will watch your condition.  Will get help right away if you are  not doing well or get worse.   This information is not intended to replace advice given to you by your health care provider. Make sure you discuss any questions you have with your health care provider.   Document Released: 08/16/2011 Document Revised: 09/17/2014 Document Reviewed: 05/01/2013 Elsevier Interactive Patient Education [employer].

## 2015-07-13 NOTE — ED Notes (Signed)
Pt dx with PNE yesterday at pcp's and was notified to come to ER for further eval .

## 2015-08-26 ENCOUNTER — Emergency Department: Payer: Medicare Other

## 2015-08-26 ENCOUNTER — Inpatient Hospital Stay
Admission: EM | Admit: 2015-08-26 | Discharge: 2015-09-01 | DRG: 872 | Disposition: A | Discharge status: Home Health | Payer: Medicare Other | Attending: Internal Medicine | Admitting: Internal Medicine

## 2015-08-26 ENCOUNTER — Encounter: Payer: Self-pay | Admitting: *Deleted

## 2015-08-26 DIAGNOSIS — Z7982 Long term (current) use of aspirin: Secondary | ICD-10-CM

## 2015-08-26 DIAGNOSIS — J9610 Chronic respiratory failure, unspecified whether with hypoxia or hypercapnia: Secondary | ICD-10-CM | POA: Diagnosis present

## 2015-08-26 DIAGNOSIS — I2510 Atherosclerotic heart disease of native coronary artery without angina pectoris: Secondary | ICD-10-CM | POA: Diagnosis present

## 2015-08-26 DIAGNOSIS — I252 Old myocardial infarction: Secondary | ICD-10-CM

## 2015-08-26 DIAGNOSIS — J45909 Unspecified asthma, uncomplicated: Secondary | ICD-10-CM | POA: Diagnosis present

## 2015-08-26 DIAGNOSIS — R000 Tachycardia, unspecified: Secondary | ICD-10-CM

## 2015-08-26 DIAGNOSIS — E785 Hyperlipidemia, unspecified: Secondary | ICD-10-CM | POA: Diagnosis present

## 2015-08-26 DIAGNOSIS — J441 Chronic obstructive pulmonary disease with (acute) exacerbation: Secondary | ICD-10-CM

## 2015-08-26 DIAGNOSIS — Z7952 Long term (current) use of systemic steroids: Secondary | ICD-10-CM

## 2015-08-26 DIAGNOSIS — Z7902 Long term (current) use of antithrombotics/antiplatelets: Secondary | ICD-10-CM

## 2015-08-26 DIAGNOSIS — M545 Low back pain: Secondary | ICD-10-CM | POA: Diagnosis present

## 2015-08-26 DIAGNOSIS — F17210 Nicotine dependence, cigarettes, uncomplicated: Secondary | ICD-10-CM | POA: Diagnosis present

## 2015-08-26 DIAGNOSIS — A4151 Sepsis due to Escherichia coli [E. coli]: Principal | ICD-10-CM | POA: Diagnosis present

## 2015-08-26 DIAGNOSIS — R0902 Hypoxemia: Secondary | ICD-10-CM

## 2015-08-26 DIAGNOSIS — N12 Tubulo-interstitial nephritis, not specified as acute or chronic: Secondary | ICD-10-CM

## 2015-08-26 DIAGNOSIS — E874 Mixed disorder of acid-base balance: Secondary | ICD-10-CM | POA: Diagnosis present

## 2015-08-26 DIAGNOSIS — N183 Chronic kidney disease, stage 3 (moderate): Secondary | ICD-10-CM | POA: Diagnosis present

## 2015-08-26 DIAGNOSIS — Z79899 Other long term (current) drug therapy: Secondary | ICD-10-CM

## 2015-08-26 DIAGNOSIS — R0600 Dyspnea, unspecified: Secondary | ICD-10-CM

## 2015-08-26 DIAGNOSIS — F410 Panic disorder [episodic paroxysmal anxiety] without agoraphobia: Secondary | ICD-10-CM | POA: Diagnosis present

## 2015-08-26 DIAGNOSIS — R0602 Shortness of breath: Secondary | ICD-10-CM

## 2015-08-26 DIAGNOSIS — F411 Generalized anxiety disorder: Secondary | ICD-10-CM | POA: Diagnosis present

## 2015-08-26 DIAGNOSIS — A419 Sepsis, unspecified organism: Secondary | ICD-10-CM | POA: Diagnosis present

## 2015-08-26 HISTORY — DX: Acute myocardial infarction, unspecified: I21.9

## 2015-08-26 LAB — CBC
HEMATOCRIT: 35.5 % (ref 35.0–47.0)
HEMOGLOBIN: 11.6 g/dL — AB (ref 12.0–16.0)
MCH: 32.8 pg (ref 26.0–34.0)
MCHC: 32.7 g/dL (ref 32.0–36.0)
MCV: 100.3 fL — AB (ref 80.0–100.0)
PLATELETS: 201 10*3/uL (ref 150–440)
RBC: 3.54 MIL/uL — AB (ref 3.80–5.20)
RDW: 15 % — ABNORMAL HIGH (ref 11.5–14.5)
WBC: 23.7 10*3/uL — AB (ref 3.6–11.0)

## 2015-08-26 LAB — BASIC METABOLIC PANEL
Anion gap: 11 (ref 5–15)
BUN: 22 mg/dL — ABNORMAL HIGH (ref 6–20)
CO2: 36 mmol/L — AB (ref 22–32)
Calcium: 8.9 mg/dL (ref 8.9–10.3)
Chloride: 91 mmol/L — ABNORMAL LOW (ref 101–111)
Creatinine, Ser: 1.69 mg/dL — ABNORMAL HIGH (ref 0.44–1.00)
GFR calc non Af Amer: 27 mL/min — ABNORMAL LOW (ref >60)
GFR, EST AFRICAN AMERICAN: 32 mL/min — AB (ref >60)
Glucose, Bld: 137 mg/dL — ABNORMAL HIGH (ref 65–99)
Potassium: 3.3 mmol/L — ABNORMAL LOW (ref 3.5–5.1)
Sodium: 138 mmol/L (ref 135–145)

## 2015-08-26 MED ORDER — ALBUTEROL SULFATE (2.5 MG/3ML) 0.083% IN NEBU
5.0000 mg | INHALATION_SOLUTION | Freq: Once | RESPIRATORY_TRACT | Status: AC
  Administered 2015-08-27: 5 mg via RESPIRATORY_TRACT
  Filled 2015-08-26 (×2): qty 6

## 2015-08-26 MED ORDER — IPRATROPIUM-ALBUTEROL 0.5-2.5 (3) MG/3ML IN SOLN
3.0000 mL | Freq: Once | RESPIRATORY_TRACT | Status: AC
  Administered 2015-08-27: 3 mL via RESPIRATORY_TRACT
  Filled 2015-08-26: qty 3

## 2015-08-26 MED ORDER — SODIUM CHLORIDE 0.9 % IV BOLUS (SEPSIS)
500.0000 mL | Freq: Once | INTRAVENOUS | Status: AC
  Administered 2015-08-27: 500 mL via INTRAVENOUS

## 2015-08-26 NOTE — ED Notes (Signed)
Pt reports coming to ED today due to a cough that pt reports "having on and off" for unknown amount of time. Pt reports coughing white sputum. Pt reports becoming SOB today. Pt wearing 2L O2 from home. Pt also verbalizing having chronic back pain that is hurting worse today as well as generalized fatigue and weakness.

## 2015-08-26 NOTE — ED Notes (Signed)
Spoke with MD and was told to hold with albuterol treatment and bring pt to main ED. Pt will be roomed after radiology.

## 2015-08-27 ENCOUNTER — Encounter: Payer: Self-pay | Admitting: Internal Medicine

## 2015-08-27 ENCOUNTER — Inpatient Hospital Stay: Payer: Medicare Other

## 2015-08-27 DIAGNOSIS — Z7902 Long term (current) use of antithrombotics/antiplatelets: Secondary | ICD-10-CM | POA: Diagnosis not present

## 2015-08-27 DIAGNOSIS — Z7952 Long term (current) use of systemic steroids: Secondary | ICD-10-CM | POA: Diagnosis not present

## 2015-08-27 DIAGNOSIS — E874 Mixed disorder of acid-base balance: Secondary | ICD-10-CM | POA: Diagnosis present

## 2015-08-27 DIAGNOSIS — A4151 Sepsis due to Escherichia coli [E. coli]: Secondary | ICD-10-CM | POA: Diagnosis present

## 2015-08-27 DIAGNOSIS — I2510 Atherosclerotic heart disease of native coronary artery without angina pectoris: Secondary | ICD-10-CM | POA: Diagnosis present

## 2015-08-27 DIAGNOSIS — M545 Low back pain: Secondary | ICD-10-CM | POA: Diagnosis present

## 2015-08-27 DIAGNOSIS — J45909 Unspecified asthma, uncomplicated: Secondary | ICD-10-CM | POA: Diagnosis present

## 2015-08-27 DIAGNOSIS — Z7982 Long term (current) use of aspirin: Secondary | ICD-10-CM | POA: Diagnosis not present

## 2015-08-27 DIAGNOSIS — J9610 Chronic respiratory failure, unspecified whether with hypoxia or hypercapnia: Secondary | ICD-10-CM | POA: Diagnosis present

## 2015-08-27 DIAGNOSIS — F411 Generalized anxiety disorder: Secondary | ICD-10-CM | POA: Diagnosis present

## 2015-08-27 DIAGNOSIS — A419 Sepsis, unspecified organism: Secondary | ICD-10-CM | POA: Diagnosis present

## 2015-08-27 DIAGNOSIS — F17210 Nicotine dependence, cigarettes, uncomplicated: Secondary | ICD-10-CM | POA: Diagnosis present

## 2015-08-27 DIAGNOSIS — N12 Tubulo-interstitial nephritis, not specified as acute or chronic: Secondary | ICD-10-CM | POA: Diagnosis present

## 2015-08-27 DIAGNOSIS — F410 Panic disorder [episodic paroxysmal anxiety] without agoraphobia: Secondary | ICD-10-CM | POA: Diagnosis present

## 2015-08-27 DIAGNOSIS — Z79899 Other long term (current) drug therapy: Secondary | ICD-10-CM | POA: Diagnosis not present

## 2015-08-27 DIAGNOSIS — N183 Chronic kidney disease, stage 3 (moderate): Secondary | ICD-10-CM | POA: Diagnosis present

## 2015-08-27 DIAGNOSIS — E785 Hyperlipidemia, unspecified: Secondary | ICD-10-CM | POA: Diagnosis present

## 2015-08-27 DIAGNOSIS — J441 Chronic obstructive pulmonary disease with (acute) exacerbation: Secondary | ICD-10-CM | POA: Diagnosis present

## 2015-08-27 DIAGNOSIS — I252 Old myocardial infarction: Secondary | ICD-10-CM | POA: Diagnosis not present

## 2015-08-27 LAB — URINALYSIS COMPLETE WITH MICROSCOPIC (ARMC ONLY)
Bilirubin Urine: NEGATIVE
GLUCOSE, UA: NEGATIVE mg/dL
Ketones, ur: NEGATIVE mg/dL
Nitrite: POSITIVE — AB
PROTEIN: 100 mg/dL — AB
SPECIFIC GRAVITY, URINE: 1.021 (ref 1.005–1.030)
pH: 5 (ref 5.0–8.0)

## 2015-08-27 LAB — HEPATIC FUNCTION PANEL
ALBUMIN: 3.4 g/dL — AB (ref 3.5–5.0)
ALK PHOS: 50 U/L (ref 38–126)
ALT: 11 U/L — AB (ref 14–54)
AST: 18 U/L (ref 15–41)
BILIRUBIN TOTAL: 2.1 mg/dL — AB (ref 0.3–1.2)
Bilirubin, Direct: 0.4 mg/dL (ref 0.1–0.5)
Indirect Bilirubin: 1.7 mg/dL — ABNORMAL HIGH (ref 0.3–0.9)
TOTAL PROTEIN: 6.9 g/dL (ref 6.5–8.1)

## 2015-08-27 LAB — BLOOD GAS, VENOUS
ACID-BASE EXCESS: 15.5 mmol/L — AB (ref 0.0–3.0)
BICARBONATE: 42.8 meq/L — AB (ref 21.0–28.0)
PATIENT TEMPERATURE: 37
pCO2, Ven: 63 mmHg — ABNORMAL HIGH (ref 44.0–60.0)
pH, Ven: 7.44 — ABNORMAL HIGH (ref 7.320–7.430)
pO2, Ven: <31 mmHg (ref 30.0–45.0)

## 2015-08-27 LAB — TROPONIN I: Troponin I: 0.05 ng/mL — ABNORMAL HIGH (ref <0.031)

## 2015-08-27 MED ORDER — DEXTROSE 5 % IV SOLN
1.0000 g | Freq: Once | INTRAVENOUS | Status: AC
  Administered 2015-08-27: 1 g via INTRAVENOUS
  Filled 2015-08-27: qty 10

## 2015-08-27 MED ORDER — POTASSIUM CHLORIDE IN NACL 40-0.9 MEQ/L-% IV SOLN
INTRAVENOUS | Status: DC
  Administered 2015-08-27 – 2015-08-28 (×3): 125 mL/h via INTRAVENOUS
  Filled 2015-08-27 (×6): qty 1000

## 2015-08-27 MED ORDER — HYDROCODONE-ACETAMINOPHEN 5-325 MG PO TABS
1.0000 | ORAL_TABLET | Freq: Every day | ORAL | Status: DC | PRN
  Administered 2015-08-27: 1 via ORAL
  Filled 2015-08-27 (×2): qty 1

## 2015-08-27 MED ORDER — METHYLPREDNISOLONE SODIUM SUCC 125 MG IJ SOLR
125.0000 mg | Freq: Once | INTRAMUSCULAR | Status: AC
  Administered 2015-08-27: 125 mg via INTRAVENOUS
  Filled 2015-08-27: qty 2

## 2015-08-27 MED ORDER — PANTOPRAZOLE SODIUM 40 MG PO TBEC
40.0000 mg | DELAYED_RELEASE_TABLET | Freq: Every day | ORAL | Status: DC
Start: 2015-08-27 — End: 2015-09-01
  Administered 2015-08-27 – 2015-09-01 (×6): 40 mg via ORAL
  Filled 2015-08-27 (×6): qty 1

## 2015-08-27 MED ORDER — ONDANSETRON HCL 4 MG/2ML IJ SOLN: 4.0000 mg | Freq: Four times a day (QID) | INTRAMUSCULAR | Status: DC | PRN

## 2015-08-27 MED ORDER — OXYCODONE-ACETAMINOPHEN 5-325 MG PO TABS
1.0000 | ORAL_TABLET | Freq: Once | ORAL | Status: AC
  Administered 2015-08-27: 1 via ORAL
  Filled 2015-08-27: qty 1

## 2015-08-27 MED ORDER — DEXTROSE 5 % IV SOLN
1.0000 g | INTRAVENOUS | Status: DC
  Filled 2015-08-27: qty 10

## 2015-08-27 MED ORDER — VANCOMYCIN HCL IN DEXTROSE 750-5 MG/150ML-% IV SOLN
750.0000 mg | INTRAVENOUS | Status: DC
  Administered 2015-08-28: 750 mg via INTRAVENOUS
  Filled 2015-08-27: qty 150

## 2015-08-27 MED ORDER — DOCUSATE SODIUM 100 MG PO CAPS
100.0000 mg | ORAL_CAPSULE | Freq: Two times a day (BID) | ORAL | Status: DC
  Administered 2015-08-27 – 2015-09-01 (×11): 100 mg via ORAL
  Filled 2015-08-27 (×11): qty 1

## 2015-08-27 MED ORDER — SODIUM CHLORIDE 0.9 % IJ SOLN
3.0000 mL | Freq: Two times a day (BID) | INTRAMUSCULAR | Status: DC
  Administered 2015-08-27 – 2015-08-28 (×3): 3 mL via INTRAVENOUS
  Administered 2015-08-29: 21:00:00 via INTRAVENOUS
  Administered 2015-08-29 – 2015-09-01 (×6): 3 mL via INTRAVENOUS

## 2015-08-27 MED ORDER — ACETAMINOPHEN 325 MG PO TABS
650.0000 mg | ORAL_TABLET | Freq: Four times a day (QID) | ORAL | Status: DC | PRN
  Administered 2015-09-01: 650 mg via ORAL
  Filled 2015-08-27: qty 2

## 2015-08-27 MED ORDER — TIZANIDINE HCL 4 MG PO TABS
2.0000 mg | ORAL_TABLET | Freq: Three times a day (TID) | ORAL | Status: DC | PRN
  Administered 2015-08-28 – 2015-08-29 (×3): 2 mg via ORAL
  Filled 2015-08-27 (×4): qty 1

## 2015-08-27 MED ORDER — LOSARTAN POTASSIUM 50 MG PO TABS
50.0000 mg | ORAL_TABLET | Freq: Every day | ORAL | Status: DC
  Administered 2015-08-27 – 2015-09-01 (×6): 50 mg via ORAL
  Filled 2015-08-27 (×6): qty 1

## 2015-08-27 MED ORDER — ALBUTEROL SULFATE (2.5 MG/3ML) 0.083% IN NEBU
2.5000 mg | INHALATION_SOLUTION | RESPIRATORY_TRACT | Status: DC | PRN
Start: 2015-08-27 — End: 2015-08-28
  Administered 2015-08-27 – 2015-08-28 (×2): 2.5 mg via RESPIRATORY_TRACT
  Filled 2015-08-27 (×2): qty 3

## 2015-08-27 MED ORDER — VANCOMYCIN HCL IN DEXTROSE 1-5 GM/200ML-% IV SOLN
1000.0000 mg | Freq: Once | INTRAVENOUS | Status: AC
  Administered 2015-08-27: 1000 mg via INTRAVENOUS
  Filled 2015-08-27: qty 200

## 2015-08-27 MED ORDER — CLOPIDOGREL BISULFATE 75 MG PO TABS
75.0000 mg | ORAL_TABLET | Freq: Every day | ORAL | Status: DC
  Administered 2015-08-27 – 2015-09-01 (×6): 75 mg via ORAL
  Filled 2015-08-27 (×6): qty 1

## 2015-08-27 MED ORDER — ALPRAZOLAM 0.5 MG PO TABS
0.5000 mg | ORAL_TABLET | Freq: Two times a day (BID) | ORAL | Status: DC | PRN
  Administered 2015-08-29 – 2015-08-31 (×3): 0.5 mg via ORAL
  Filled 2015-08-27 (×4): qty 1

## 2015-08-27 MED ORDER — SODIUM CHLORIDE 0.9 % IV BOLUS (SEPSIS)
1000.0000 mL | Freq: Once | INTRAVENOUS | Status: AC
  Administered 2015-08-27: 1000 mL via INTRAVENOUS

## 2015-08-27 MED ORDER — TRAZODONE HCL 100 MG PO TABS
100.0000 mg | ORAL_TABLET | Freq: Every day | ORAL | Status: DC
  Administered 2015-08-27 – 2015-08-31 (×5): 100 mg via ORAL
  Filled 2015-08-27 (×5): qty 1

## 2015-08-27 MED ORDER — IPRATROPIUM-ALBUTEROL 0.5-2.5 (3) MG/3ML IN SOLN
3.0000 mL | Freq: Once | RESPIRATORY_TRACT | Status: AC
  Administered 2015-08-27: 3 mL via RESPIRATORY_TRACT
  Filled 2015-08-27: qty 3

## 2015-08-27 MED ORDER — IOHEXOL 240 MG/ML SOLN
50.0000 mL | INTRAMUSCULAR | Status: AC
  Administered 2015-08-27: 25 mL via ORAL

## 2015-08-27 MED ORDER — ONDANSETRON HCL 4 MG PO TABS
4.0000 mg | ORAL_TABLET | Freq: Four times a day (QID) | ORAL | Status: DC | PRN
Start: 2015-08-27 — End: 2015-09-01

## 2015-08-27 MED ORDER — ALBUTEROL SULFATE HFA 108 (90 BASE) MCG/ACT IN AERS: 2.0000 | INHALATION_SPRAY | RESPIRATORY_TRACT | Status: DC | PRN

## 2015-08-27 MED ORDER — ATORVASTATIN CALCIUM 10 MG PO TABS
10.0000 mg | ORAL_TABLET | Freq: Every day | ORAL | Status: DC
  Administered 2015-08-27 – 2015-08-31 (×5): 10 mg via ORAL
  Filled 2015-08-27 (×5): qty 1

## 2015-08-27 MED ORDER — ACETAMINOPHEN 650 MG RE SUPP: 650.0000 mg | Freq: Four times a day (QID) | RECTAL | Status: DC | PRN

## 2015-08-27 MED ORDER — HEPARIN SODIUM (PORCINE) 5000 UNIT/ML IJ SOLN
5000.0000 [IU] | Freq: Three times a day (TID) | INTRAMUSCULAR | Status: DC
  Administered 2015-08-27 – 2015-08-31 (×13): 5000 [IU] via SUBCUTANEOUS
  Filled 2015-08-27 (×13): qty 1

## 2015-08-27 MED ORDER — LIDOCAINE 5 % EX OINT
1.0000 "application " | TOPICAL_OINTMENT | Freq: Four times a day (QID) | CUTANEOUS | Status: DC | PRN
  Administered 2015-08-27: 1 via TOPICAL
  Filled 2015-08-27 (×3): qty 35.44

## 2015-08-27 NOTE — ED Notes (Signed)
Patient transported to CT 

## 2015-08-27 NOTE — Progress Notes (Signed)
Pt on chronic o2 at home . 2 liters .Marland Kitchen  And no need for continuous o2 monitoring . Weak. Spoke with dr Volanda Napoleon. md ordered physcial tx and d/c o2 sat

## 2015-08-27 NOTE — ED Notes (Signed)
MD at bedside. 

## 2015-08-27 NOTE — Progress Notes (Signed)
ANTIBIOTIC CONSULT NOTE - INITIAL  Pharmacy Consult for Vancomycin  Indication: rule out sepsis  Allergies  Allergen Reactions  . Codeine Itching and Rash    Patient Measurements: Height: '5\' 1"'$  (154.9 cm) Weight: 173 lb (78.472 kg) IBW/kg (Calculated) : 47.8 Adjusted Body Weight: 60.1 kg  Vital Signs: Temp: 98.2 F (36.8 C) (12/17 0733) Temp Source: Oral (12/17 0733) BP: 93/48 mmHg (12/17 0733) Pulse Rate: 96 (12/17 0733) Intake/Output from previous day: 12/16 0701 - 12/17 0700 In: 550 [IV Piggyback:550] Out: -  Intake/Output from this shift:    Labs:  Recent Labs  08/26/15 2325  WBC 23.7*  HGB 11.6*  PLT 201  CREATININE 1.69*   Estimated Creatinine Clearance: 25.2 mL/min (by C-G formula based on Cr of 1.69). No results for input(s): VANCOTROUGH, VANCOPEAK, VANCORANDOM, GENTTROUGH, GENTPEAK, GENTRANDOM, TOBRATROUGH, TOBRAPEAK, TOBRARND, AMIKACINPEAK, AMIKACINTROU, AMIKACIN in the last 72 hours.   Microbiology: No results found for this or any previous visit (from the past 720 hour(s)).  Medical History: Past Medical History  Diagnosis Date  . COPD (chronic obstructive pulmonary disease) ([place])   . Asthma      Assessment: 81 yo female here with pyelonephritis and sepsis starting on vancomycin. Also on ceftriaxone. Pt received vancomycin 1 g IV x1 on 12/17 at 0547.  Ke 0.021, half life 33 h, Vd 42 L  Goal of Therapy:  Vancomycin trough level 15-20 mcg/ml  Plan:  Will order vancomycin 750 mg IV q36h starting 18 h after initial one time dose for stacked dosing.  Will order trough before 3rd dose (12/20 at 2330).  Will order SCr for AM - will need to follow renal function and adjust dosing as warranted.  Will need to continue to follow culture results.   Pharmacy will continue to follow.     Rayna Sexton L 08/27/2015,8:24 AM

## 2015-08-27 NOTE — ED Notes (Signed)
Relayed to Dr. Dahlia Client Troponin of 0.05

## 2015-08-27 NOTE — H&P (Signed)
Debra [name] is an 81 y.o. female.   Chief Complaint: Shortness of breath HPI: The patient presents emergency department initially complaining of shortness of breath. She admits to a nonproductive cough that seems to be mildly worse as the weather has turned cold. Further questioning reveals the patient has been suffering from general malaise as well as urinary urgency. She admits to frequency of urination and complains of dysuria as of this morning. She denies fevers, nausea or vomiting. Her shortness of breath and cough has never been associated with chest pain during this illness. In the emergency department the patient's ABG showed some derangement of normal values but she was in no respiratory distress. However, urinalysis revealed urinary tract infection with clinical presentation consistent with pyelonephritis which prompted the emergency department staff to call for admission.  Past Medical History  Diagnosis Date  . COPD (chronic obstructive pulmonary disease) ([place])   . Asthma     Past Surgical History  Procedure Laterality Date  . Abdominal hysterectomy    . Cholecystectomy    . Tonsillectomy    . Breast surgery    . Bypass graft  2007    triple    Family History  Problem Relation Age of Onset  . CAD Father    Social History:  reports that she has been smoking.  She does not have any smokeless tobacco history on file. She reports that she does not drink alcohol or use illicit drugs.  Allergies:  Allergies  Allergen Reactions  . Codeine Itching and Rash    Prior to Admission medications   Medication Sig Start Date End Date Taking? Authorizing Provider  ALPRAZolam Duanne Moron) 0.5 MG tablet Take 1 tablet by mouth 2 (two) times daily as needed. 08/12/15  Yes Historical Provider, MD  atorvastatin (LIPITOR) 10 MG tablet Take 1 tablet by mouth at bedtime.  06/24/15  Yes Historical Provider, MD  clopidogrel (PLAVIX) 75 MG tablet Take 1 tablet by mouth daily. 08/17/15  Yes  Historical Provider, MD  HYDROcodone-acetaminophen (NORCO/VICODIN) 5-325 MG tablet Take 1 tablet by mouth daily as needed for moderate pain or severe pain.  08/23/15  Yes Historical Provider, MD  lidocaine (XYLOCAINE) 5 % ointment Apply 1-2 g topically 4 (four) times daily as needed for mild pain or moderate pain. apply only to a single body part per application (feet OR hands) 08/10/15  Yes Historical Provider, MD  losartan (COZAAR) 50 MG tablet Take 1 tablet by mouth daily. 08/15/15  Yes Historical Provider, MD  pantoprazole (PROTONIX) 40 MG tablet Take 1 tablet by mouth daily. 08/17/15  Yes Historical Provider, MD  PROAIR HFA 108 (90 BASE) MCG/ACT inhaler Inhale 2 puffs into the lungs every 4 (four) hours as needed for wheezing or shortness of breath.  08/06/15  Yes Historical Provider, MD  tiZANidine (ZANAFLEX) 2 MG tablet Take 1 tablet by mouth 3 (three) times daily as needed. 07/29/15  Yes Historical Provider, MD  traZODone (DESYREL) 50 MG tablet Take 2 tablets by mouth at bedtime. 08/26/15  Yes Historical Provider, MD     Results for orders placed or performed during the hospital encounter of 08/26/15 (from the past 48 hour(s))  Basic metabolic panel     Status: Abnormal   Collection Time: 08/26/15 11:25 PM  Result Value Ref Range   Sodium 138 135 - 145 mmol/L   Potassium 3.3 (L) 3.5 - 5.1 mmol/L   Chloride 91 (L) 101 - 111 mmol/L   CO2 36 (H) 22 - 32 mmol/L  Glucose, Bld 137 (H) 65 - 99 mg/dL   BUN 22 (H) 6 - 20 mg/dL   Creatinine, Ser 1.69 (H) 0.44 - 1.00 mg/dL   Calcium 8.9 8.9 - 10.3 mg/dL   GFR calc non Af Amer 27 (L) >60 mL/min   GFR calc Af Amer 32 (L) >60 mL/min    Comment: (NOTE) The eGFR has been calculated using the CKD EPI equation. This calculation has not been validated in all clinical situations. eGFR's persistently <60 mL/min signify possible Chronic Kidney Disease.    Anion gap 11 5 - 15  CBC     Status: Abnormal   Collection Time: 08/26/15 11:25 PM  Result  Value Ref Range   WBC 23.7 (H) 3.6 - 11.0 K/uL   RBC 3.54 (L) 3.80 - 5.20 MIL/uL   Hemoglobin 11.6 (L) 12.0 - 16.0 g/dL   HCT 35.5 35.0 - 47.0 %   MCV 100.3 (H) 80.0 - 100.0 fL   MCH 32.8 26.0 - 34.0 pg   MCHC 32.7 32.0 - 36.0 g/dL   RDW 15.0 (H) 11.5 - 14.5 %   Platelets 201 150 - 440 K/uL  Hepatic function panel     Status: Abnormal   Collection Time: 08/27/15 12:00 AM  Result Value Ref Range   Total Protein 6.9 6.5 - 8.1 g/dL   Albumin 3.4 (L) 3.5 - 5.0 g/dL   AST 18 15 - 41 U/L   ALT 11 (L) 14 - 54 U/L   Alkaline Phosphatase 50 38 - 126 U/L   Total Bilirubin 2.1 (H) 0.3 - 1.2 mg/dL   Bilirubin, Direct 0.4 0.1 - 0.5 mg/dL   Indirect Bilirubin 1.7 (H) 0.3 - 0.9 mg/dL  Troponin I     Status: Abnormal   Collection Time: 08/27/15 12:00 AM  Result Value Ref Range   Troponin I 0.05 (H) <0.031 ng/mL    Comment: READ BACK AND VERIFIED WITH DAVID WALKER AT 0050 ON 08/27/15 [facility]        PERSISTENTLY INCREASED TROPONIN VALUES IN THE RANGE OF 0.04-0.49 ng/mL CAN BE SEEN IN:       -UNSTABLE ANGINA       -CONGESTIVE HEART FAILURE       -MYOCARDITIS       -CHEST TRAUMA       -ARRYHTHMIAS       -LATE PRESENTING MYOCARDIAL INFARCTION       -COPD   CLINICAL FOLLOW-UP RECOMMENDED.   Blood gas, venous     Status: Abnormal   Collection Time: 08/27/15 12:20 AM  Result Value Ref Range   pH, Ven 7.44 (H) 7.320 - 7.430   pCO2, Ven 63 (H) 44.0 - 60.0 mmHg   pO2, Ven <31.0 30.0 - 45.0 mmHg   Bicarbonate 42.8 (H) 21.0 - 28.0 mEq/L   Acid-Base Excess 15.5 (H) 0.0 - 3.0 mmol/L   Patient temperature 37.0    Collection site RIGHT ANTECUBITAL    Sample type VENOUS   Urinalysis complete, with microscopic ([facility] only)     Status: Abnormal   Collection Time: 08/27/15  3:46 AM  Result Value Ref Range   Color, Urine AMBER (A) YELLOW   APPearance CLOUDY (A) CLEAR   Glucose, UA NEGATIVE NEGATIVE mg/dL   Bilirubin Urine NEGATIVE NEGATIVE   Ketones, ur NEGATIVE NEGATIVE mg/dL   Specific Gravity,  Urine 1.021 1.005 - 1.030   Hgb urine dipstick 1+ (A) NEGATIVE   pH 5.0 5.0 - 8.0   Protein, ur 100 (A) NEGATIVE mg/dL  Nitrite POSITIVE (A) NEGATIVE   Leukocytes, UA 3+ (A) NEGATIVE   RBC / HPF 6-30 0 - 5 RBC/hpf   WBC, UA TOO NUMEROUS TO COUNT 0 - 5 WBC/hpf   Bacteria, UA MANY (A) NONE SEEN   Squamous Epithelial / LPF 6-30 (A) NONE SEEN   WBC Clumps PRESENT    Mucous PRESENT    Hyaline Casts, UA PRESENT    Dg Chest 2 View  08/26/2015  CLINICAL DATA:  Cough and shortness of breath. EXAM: CHEST  2 VIEW COMPARISON:  Radiographs 07/13/2015 FINDINGS: The lungs are hyperinflated with emphysema. Patient is post median sternotomy. Stable cardiomegaly and tortuous thoracic aorta. No confluent airspace disease, evident pulmonary edema, pleural effusion or pneumothorax. The bones are under mineralized, kyphoplasty within multiple vertebra. IMPRESSION: Stable hyperinflation.  No acute process. Electronically Signed   By: Jeb Levering M.D.   On: 08/26/2015 23:33    Review of Systems  Constitutional: Positive for chills. Negative for fever.  HENT: Negative for sore throat and tinnitus.   Eyes: Negative for blurred vision and redness.  Respiratory: Negative for cough and shortness of breath.   Cardiovascular: Negative for chest pain, palpitations, orthopnea and PND.  Gastrointestinal: Negative for nausea, vomiting, abdominal pain and diarrhea.  Genitourinary: Positive for dysuria, urgency and frequency.  Musculoskeletal: Negative for myalgias and joint pain.  Skin: Negative for rash.       No lesions  Neurological: Negative for speech change, focal weakness and weakness.  Endo/Heme/Allergies: Does not bruise/bleed easily.       No temperature intolerance  Psychiatric/Behavioral: Negative for depression and suicidal ideas.    Blood pressure 100/70, pulse 111, temperature 98.2 F (36.8 C), temperature source Oral, resp. rate 22, height 5' 1" (1.549 m), weight 78.472 kg (173 lb), SpO2 95  %. Physical Exam  Vitals reviewed. Constitutional: She is oriented to person, place, and time. She appears well-developed and well-nourished. No distress.  HENT:  Head: Normocephalic and atraumatic.  Mouth/Throat: Oropharynx is clear and moist.  Eyes: Conjunctivae and EOM are normal. Pupils are equal, round, and reactive to light. No scleral icterus.  Neck: Normal range of motion. Neck supple. No JVD present. No tracheal deviation present. No thyromegaly present.  Cardiovascular: Normal rate, regular rhythm and normal heart sounds.  Exam reveals no gallop and no friction rub.   No murmur heard. Respiratory: Effort normal and breath sounds normal.  GI: Soft. Bowel sounds are normal. She exhibits no distension. There is no tenderness. There is CVA tenderness.  Genitourinary:  Deferred  Musculoskeletal: Normal range of motion. She exhibits no edema.  Lymphadenopathy:    She has no cervical adenopathy.  Neurological: She is alert and oriented to person, place, and time. No cranial nerve deficit. She exhibits normal muscle tone.  Skin: Skin is warm and dry. No rash noted. No erythema.  Psychiatric: She has a normal mood and affect. Her behavior is normal. Judgment and thought content normal.     Assessment/Plan This is an 81 year old Caucasian female admitted for sepsis secondary to pyelonephritis. 1. Sepsis: The patient's criteria via leukocytosis, tachypnea and tachycardia. The patient has received Rocephin in the emergency department. I have added vancomycin. Fluid resuscitate. Blood cultures are pending. Follow for growth and sensitivities. She is hemodynamically stable although her blood pressure is on the low side. Aggressively hydrate 2. Pyelonephritis: Present clinically; CT abdomen pending. Continue IV antibiotics until the patient is no longer septic. 3. COPD: Stable; mixed respiratory acidosis with metabolic alkalosis. Oxygenating well.  Continue inhalers per home regimen 4. Coronary  artery disease: Stable. Continue Plavix 5. Essential hypertension: Continue losartan 6. Hyperlipidemia: Continue statin therapy 7. Tobacco abuse: NicoDerm patch (the patient quit 4 weeks ago) 8. DVT prophylaxis: Heparin 9. GI prophylaxis: None The patient is a full code. Time spent on admission was inpatient care approximately 45 minutes  Harrie Foreman 08/27/2015, 6:25 AM

## 2015-08-27 NOTE — Plan of Care (Signed)
Problem: Acute Rehab PT Goals(only PT should resolve) Goal: Patient Will Transfer Sit To/From Stand Pt will transfer sit to/from-stand with RW at [place] without loss-of-balance to demonstrate good safety awareness for independent mobility in home.     Goal: Pt Will Ambulate Pt will ambulate with RW at Supervision using a step-through pattern and equal step length for a distances greater than 137f to demonstrate the ability to perform safe household distance ambulation at discharge.    Goal: Pt Will Go Up/Down Stairs Pt will ambulate up a 231framp with RW with supervision to demonstrate safe ability to enter home s/p DC.

## 2015-08-27 NOTE — Evaluation (Signed)
Physical Therapy Evaluation Patient Details Name: Debra [name] MRN: [id number] DOB: [birth year] Today's Date: 08/27/2015   History of Present Illness  Pt is a 81yo white female with a PMH: CPOD, emphysema, CAD, HTN, HLD, s/p sternotomy, who is on 2L O2 at home for ~6 months. Pt's youngest son is at bedside assisting with history as needed. Pt reports 3 LOC in november resultant in falls in home that remain insidious at this time. Pt has chronic back pain for which she takes PRN meds, and very limited mobility status for the past 8 months, walking exclusively in the home with rollator, often desaturating to 88% with short household distances. Pt came to [facility] after worsening SOB x1 day, found also to have tachypnea and tachycardia, admitted for sepsis, and found to have active pyelonephritis.    Clinical Impression  Pt is received semirecumbent in bed upon entry, awake, alert, and willing to participate. Son is at bedside. Pt reporting severe pain, which has not responded as desired to pain meds. Pt is A&Ox3 and pleasant, however very anxious about any mobility at this time due to pain. Pt reports three falls related to LOC in the last 45 days. Pt functional strength is moderately impaired from baseline, wherein she normally requires not assistance for bed mobility or transfers, and today requires min-modA for coming supine to seated EOB. Pain severity limits patient from attempting transfers as she normally is able. Pt remaining on 3L O2 throughout evaluation, typically on 2L at home, but desaturating with bed mobility (92%) with tachycardia to 101bp, also c sitting at EOB. Patient presenting with impairment of strength, activity tolerance, and oxygen perfusion, limiting ability to perform ADL, bed mobility, transfers, and ambulation at baseline level of function. Patient will benefit from skilled intervention to address the above impairments and limitations, in order to restore to prior level of function,  improve patient safety upon discharge, and to decrease falls risk. Recommending DC to STR for full recovery of strength and functional mobility prior to returning to home. Pt has recently started therapy with HHPT, but has not done enough sessions to say whether it has made a meaningful difference in here function as of yet.      Follow Up Recommendations SNF    Equipment Recommendations  None recommended by PT    Recommendations for Other Services       Precautions / Restrictions Precautions Precaution Comments: No fall score available at evaluation.  Restrictions Weight Bearing Restrictions: No      Mobility  Bed Mobility Overal bed mobility: Needs Assistance Bed Mobility: Supine to Sit;Sit to Supine     Supine to sit: Min assist Sit to supine: Modified independent (Device/Increase time)   General bed mobility comments: trunk weakness and BUE weakness, requires BUE to perform, with moderately labored effort.   Transfers                 General transfer comment: Refuses out of bed due to poor pain control. Pt has had pain meds, but reports that they are not helping, and does not feel she is able to stand.   Ambulation/Gait                Stairs            Wheelchair Mobility    Modified Rankin (Stroke Patients Only)       Balance  Pertinent Vitals/Pain Pain Assessment: 0-10 Pain Score: 8  Pain Location: L flank  Pain Intervention(s): Limited activity within patient's tolerance;Monitored during session;Premedicated before session;Repositioned    Home Living Family/patient expects to be discharged to:: Private residence Living Arrangements: Alone (Reports that daughter por granddaughter come and go for 1-2 weeks at a time to stay with her. Other family visit more often to assist with bathing, groceries and meals. ) Available Help at Discharge: Family;Available 24 hours/day Type  of Home: Mobile home Home Access: Ramped entrance     Home Layout: One level Home Equipment: Walker - 4 wheels      Prior Function Level of Independence: Needs assistance   Gait / Transfers Assistance Needed: Modified independence with very limited household distances, most limited by O2 perfusion. Has not ambulated in community for at least 8 months.   ADL's / Homemaking Assistance Needed: Dresses and toilets with additionally needed time. Requires assistance for bathing, groceriess, and meal prep.         Hand Dominance   Dominant Hand: Right    Extremity/Trunk Assessment   Upper Extremity Assessment: Generalized weakness;Overall [facility] for tasks assessed (grip strength is strong and equal; difficulty using BUE to pull self up to sitting. )           Lower Extremity Assessment: Generalized weakness;Overall [facility] for tasks assessed      Cervical / Trunk Assessment: Kyphotic  Communication   Communication: No difficulties  Cognition Arousal/Alertness: Awake/alert Behavior During Therapy: Anxious;Agitated;WFL for tasks assessed/performed Overall Cognitive Status: Within Functional Limits for tasks assessed                      General Comments      Exercises        Assessment/Plan    PT Assessment Patient needs continued PT services  PT Diagnosis Difficulty walking;Generalized weakness;Acute pain   PT Problem List Decreased strength;Pain;Decreased mobility;Decreased activity tolerance;Cardiopulmonary status limiting activity  PT Treatment Interventions Functional mobility training;Therapeutic activities;Therapeutic exercise   PT Goals (Current goals can be found in the Care Plan section) Acute Rehab PT Goals Patient Stated Goal: Resolve pain; figure out why she is passing out.  PT Goal Formulation: With patient Time For Goal Achievement: 09/10/15 Potential to Achieve Goals: Good    Frequency Min 2X/week   Barriers to discharge Inaccessible home  environment;Decreased caregiver support      Co-evaluation               End of Session Equipment Utilized During Treatment: Gait belt (Pt does not care for gait belt.) Activity Tolerance: Patient tolerated treatment well;Patient limited by fatigue;Patient limited by pain Patient left: in bed;with call bell/phone within reach;with bed alarm set;with family/visitor present Nurse Communication: Mobility status;Other (comment)         Time: 9485-4627 PT Time Calculation (min) (ACUTE ONLY): 18 min   Charges:   PT Evaluation $Initial PT Evaluation Tier I: 1 Procedure     PT G Codes:        Buccola,Allan C 09-19-15, 4:36 PM  4:43 PM  Etta Grandchild, PT, DPT [place] License # [id number]

## 2015-08-27 NOTE — ED Provider Notes (Signed)
[facility] Emergency Department Provider Note  ____________________________________________  Time seen: Approximately 2334 AM  I have reviewed the triage vital signs and the nursing notes.   HISTORY  Chief Complaint Shortness of Breath    HPI Debra [name] is a 81 y.o. female who comes into the hospital today with shortness of breath, right rib pain and lower back pain. The patient reports that she has been short of breath for a while but gets worse occasionally. The patient felt very weak today and could hardly get up and down. The patient has been using her inhalers for her shortness of breath and she wears 2 L of oxygen at home daily. The patient reports that she has a cough that is productive of white phlegm with no fevers or sick contacts. The patient feels as though she has abdominal pain all over and has a history of AAA. The patient reports that tonight her shortness of breath was worse than typical so she decided to come in to get evaluated and checked out. The patient has not been vomiting and she has no nausea she has no blurry vision or headache but she has a history of a tumor she reports an she's scheduled to see a neurologist.   Past Medical History  Diagnosis Date  . COPD (chronic obstructive pulmonary disease) ([place])   . Asthma     Patient Active Problem List   Diagnosis Date Noted  . Sepsis ([place]) 08/27/2015    Past Surgical History  Procedure Laterality Date  . Abdominal hysterectomy    . Cholecystectomy    . Tonsillectomy    . Breast surgery    . Bypass graft  2007    triple    No current outpatient prescriptions on file.  Allergies Codeine  Family History  Problem Relation Age of Onset  . CAD Father     Social History Social History  Substance Use Topics  . Smoking status: Current Some Day Smoker  . Smokeless tobacco: None  . Alcohol Use: No    Review of Systems Constitutional: No fever/chills Eyes: No visual  changes. ENT: No sore throat. Cardiovascular:  chest pain. Respiratory:  shortness of breath. Gastrointestinal: abdominal pain.  No nausea, no vomiting.  No diarrhea.  No constipation. Genitourinary: Negative for dysuria. Musculoskeletal:  back pain. Skin: Negative for rash. Neurological: Negative for headaches, focal weakness or numbness.  10-point ROS otherwise negative.  ____________________________________________   PHYSICAL EXAM:  VITAL SIGNS: ED Triage Vitals  Enc Vitals Group     BP 08/26/15 2252 154/0 mmHg     Pulse Rate 08/26/15 2252 122     Resp 08/26/15 2252 25     Temp 08/26/15 2252 98.2 F (36.8 C)     Temp Source 08/26/15 2252 Oral     SpO2 08/26/15 2252 94 %     Weight 08/26/15 2252 173 lb (78.472 kg)     Height 08/26/15 2252 '5\' 1"'$  (1.549 m)     Head Cir --      Peak Flow --      Pain Score 08/26/15 2302 7     Pain Loc --      Pain Edu? --      Excl. in [place]? --     Constitutional: Alert and oriented. Well appearing and in moderate distress. Eyes: Conjunctivae are normal. PERRL. EOMI. Head: Atraumatic. Nose: No congestion/rhinnorhea. Mouth/Throat: Mucous membranes are moist.  Oropharynx non-erythematous. Cardiovascular: Normal rate, regular rhythm. Grossly normal heart sounds.  Good peripheral circulation. Respiratory: Normal respiratory effort.  No retractions. Diminished breath sounds in the bilateral bases with some prolonged expiratory phase. Gastrointestinal: Soft and nontender. No distention. Positive bowel sounds Musculoskeletal: No lower extremity tenderness nor edema.  Neurologic:  Normal speech and language. Skin:  Skin is warm, dry and intact.  Psychiatric: Mood and affect are normal. .  ____________________________________________   LABS (all labs ordered are listed, but only abnormal results are displayed)  Labs Reviewed  BASIC METABOLIC PANEL - Abnormal; Notable for the following:    Potassium 3.3 (*)    Chloride 91 (*)    CO2 36  (*)    Glucose, Bld 137 (*)    BUN 22 (*)    Creatinine, Ser 1.69 (*)    GFR calc non Af Amer 27 (*)    GFR calc Af Amer 32 (*)    All other components within normal limits  CBC - Abnormal; Notable for the following:    WBC 23.7 (*)    RBC 3.54 (*)    Hemoglobin 11.6 (*)    MCV 100.3 (*)    RDW 15.0 (*)    All other components within normal limits  HEPATIC FUNCTION PANEL - Abnormal; Notable for the following:    Albumin 3.4 (*)    ALT 11 (*)    Total Bilirubin 2.1 (*)    Indirect Bilirubin 1.7 (*)    All other components within normal limits  TROPONIN I - Abnormal; Notable for the following:    Troponin I 0.05 (*)    All other components within normal limits  BLOOD GAS, VENOUS - Abnormal; Notable for the following:    pH, Ven 7.44 (*)    pCO2, Ven 63 (*)    Bicarbonate 42.8 (*)    Acid-Base Excess 15.5 (*)    All other components within normal limits  URINALYSIS COMPLETEWITH MICROSCOPIC ([facility] ONLY) - Abnormal; Notable for the following:    Color, Urine AMBER (*)    APPearance CLOUDY (*)    Hgb urine dipstick 1+ (*)    Protein, ur 100 (*)    Nitrite POSITIVE (*)    Leukocytes, UA 3+ (*)    Bacteria, UA MANY (*)    Squamous Epithelial / LPF 6-30 (*)    All other components within normal limits  CULTURE, BLOOD (ROUTINE X 2)  CULTURE, BLOOD (ROUTINE X 2)   ____________________________________________  EKG  ED ECG REPORT I, Loney Hering, the attending physician, personally viewed and interpreted this ECG.   Date: 08/26/2015  EKG Time: 2300  Rate: 120  Rhythm: sinus tachycardia  Axis: normal  Intervals:none  ST&T Change: none  ____________________________________________  RADIOLOGY  CXR: Stable hyperinflation, no acute process CT chest: Apical parenchymal nodules within the lungs bilaterally CT abd and pelvis: Left renal cystic change, nonobstructing left renal stone. ____________________________________________   PROCEDURES  Procedure(s)  performed: None  Critical Care performed: No  ____________________________________________   INITIAL IMPRESSION / ASSESSMENT AND PLAN / ED COURSE  Pertinent labs & imaging results that were available during my care of the patient were reviewed by me and considered in my medical decision making (see chart for details).  This is an 81 year old female who comes in today with some difficulty breathing and generalized weakness feeling. The patient does have a significantly elevated white blood cell count and some diminished breath sounds in the bases. I did give the patient a dose of DuoNeb as well as Solu-Medrol to help open her  up. I did receive the urinalysis which showed an infection. I gave the patient a liter of normal saline initially and then repeated that liter when I noticed the patient appeared dehydrated. Given the patient's rib pain as well as her back pain and did do a CT scan which was unremarkable. I will give the patient a dose of ceftriaxone to have her treated for her UTI and admitted to the hospital as she has had persistent tachycardia as well as some low blood pressures. ____________________________________________   FINAL CLINICAL IMPRESSION(S) / ED DIAGNOSES  Final diagnoses:  Pyelonephritis  Dyspnea  Tachycardia      Loney Hering, MD 08/27/15 [phone]

## 2015-08-28 ENCOUNTER — Inpatient Hospital Stay: Payer: Medicare Other

## 2015-08-28 LAB — BASIC METABOLIC PANEL
ANION GAP: 4 — AB (ref 5–15)
BUN: 27 mg/dL — ABNORMAL HIGH (ref 6–20)
CO2: 33 mmol/L — ABNORMAL HIGH (ref 22–32)
Calcium: 8.1 mg/dL — ABNORMAL LOW (ref 8.9–10.3)
Chloride: 102 mmol/L (ref 101–111)
Creatinine, Ser: 1.09 mg/dL — ABNORMAL HIGH (ref 0.44–1.00)
GFR, EST AFRICAN AMERICAN: 54 mL/min — AB (ref >60)
GFR, EST NON AFRICAN AMERICAN: 47 mL/min — AB (ref >60)
Glucose, Bld: 118 mg/dL — ABNORMAL HIGH (ref 65–99)
POTASSIUM: 4.3 mmol/L (ref 3.5–5.1)
SODIUM: 139 mmol/L (ref 135–145)

## 2015-08-28 LAB — BLOOD CULTURE ID PANEL (REFLEXED)
ACINETOBACTER BAUMANNII: NOT DETECTED
CANDIDA ALBICANS: NOT DETECTED
CANDIDA GLABRATA: NOT DETECTED
CANDIDA KRUSEI: NOT DETECTED
CANDIDA PARAPSILOSIS: NOT DETECTED
Candida tropicalis: NOT DETECTED
Carbapenem resistance: NOT DETECTED
ENTEROBACTER CLOACAE COMPLEX: NOT DETECTED
ENTEROBACTERIACEAE SPECIES: NOT DETECTED
ENTEROCOCCUS SPECIES: NOT DETECTED
ESCHERICHIA COLI: DETECTED — AB
Haemophilus influenzae: NOT DETECTED
KLEBSIELLA OXYTOCA: NOT DETECTED
Klebsiella pneumoniae: NOT DETECTED
LISTERIA MONOCYTOGENES: NOT DETECTED
METHICILLIN RESISTANCE: NOT DETECTED
Neisseria meningitidis: NOT DETECTED
PSEUDOMONAS AERUGINOSA: NOT DETECTED
Proteus species: NOT DETECTED
STAPHYLOCOCCUS AUREUS BCID: NOT DETECTED
STREPTOCOCCUS AGALACTIAE: NOT DETECTED
STREPTOCOCCUS PYOGENES: NOT DETECTED
STREPTOCOCCUS SPECIES: NOT DETECTED
Serratia marcescens: NOT DETECTED
Staphylococcus species: NOT DETECTED
Streptococcus pneumoniae: NOT DETECTED
Vancomycin resistance: NOT DETECTED

## 2015-08-28 LAB — CBC
HCT: 28.6 % — ABNORMAL LOW (ref 35.0–47.0)
Hemoglobin: 9.5 g/dL — ABNORMAL LOW (ref 12.0–16.0)
MCH: 33.8 pg (ref 26.0–34.0)
MCHC: 33.2 g/dL (ref 32.0–36.0)
MCV: 102.1 fL — ABNORMAL HIGH (ref 80.0–100.0)
PLATELETS: 132 10*3/uL — AB (ref 150–440)
RBC: 2.8 MIL/uL — AB (ref 3.80–5.20)
RDW: 15.8 % — ABNORMAL HIGH (ref 11.5–14.5)
WBC: 20.6 10*3/uL — AB (ref 3.6–11.0)

## 2015-08-28 MED ORDER — ALBUTEROL SULFATE (2.5 MG/3ML) 0.083% IN NEBU
2.5000 mg | INHALATION_SOLUTION | Freq: Four times a day (QID) | RESPIRATORY_TRACT | Status: DC
  Administered 2015-08-28 (×2): 2.5 mg via RESPIRATORY_TRACT
  Filled 2015-08-28 (×2): qty 3

## 2015-08-28 MED ORDER — SODIUM CHLORIDE 0.9 % IV SOLN
1.0000 g | Freq: Two times a day (BID) | INTRAVENOUS | Status: DC
  Administered 2015-08-28 – 2015-08-29 (×4): 1 g via INTRAVENOUS
  Filled 2015-08-28 (×5): qty 1

## 2015-08-28 MED ORDER — MENTHOL 3 MG MT LOZG
1.0000 | LOZENGE | OROMUCOSAL | Status: DC | PRN
  Filled 2015-08-28: qty 9

## 2015-08-28 MED ORDER — HYDROCODONE-ACETAMINOPHEN 5-325 MG PO TABS
1.0000 | ORAL_TABLET | Freq: Two times a day (BID) | ORAL | Status: DC
  Administered 2015-08-28 – 2015-09-01 (×9): 1 via ORAL
  Filled 2015-08-28 (×8): qty 1

## 2015-08-28 MED ORDER — MOMETASONE FURO-FORMOTEROL FUM 100-5 MCG/ACT IN AERO
2.0000 | INHALATION_SPRAY | Freq: Two times a day (BID) | RESPIRATORY_TRACT | Status: DC
  Administered 2015-08-28 – 2015-09-01 (×8): 2 via RESPIRATORY_TRACT
  Filled 2015-08-28 (×2): qty 8.8

## 2015-08-28 MED ORDER — VANCOMYCIN HCL IN DEXTROSE 750-5 MG/150ML-% IV SOLN
750.0000 mg | INTRAVENOUS | Status: DC
  Administered 2015-08-28: 750 mg via INTRAVENOUS
  Filled 2015-08-28 (×2): qty 150

## 2015-08-28 MED ORDER — PREDNISONE 50 MG PO TABS
50.0000 mg | ORAL_TABLET | Freq: Every day | ORAL | Status: DC
  Administered 2015-08-29 – 2015-08-31 (×3): 50 mg via ORAL
  Filled 2015-08-28 (×3): qty 1

## 2015-08-28 MED ORDER — METHYLPREDNISOLONE SODIUM SUCC 125 MG IJ SOLR
60.0000 mg | Freq: Once | INTRAMUSCULAR | Status: AC
  Administered 2015-08-28: 60 mg via INTRAVENOUS
  Filled 2015-08-28: qty 2

## 2015-08-28 MED ORDER — IPRATROPIUM-ALBUTEROL 0.5-2.5 (3) MG/3ML IN SOLN
3.0000 mL | Freq: Four times a day (QID) | RESPIRATORY_TRACT | Status: DC
  Administered 2015-08-28 – 2015-08-29 (×4): 3 mL via RESPIRATORY_TRACT
  Filled 2015-08-28 (×4): qty 3

## 2015-08-28 NOTE — Progress Notes (Signed)
Heparin sq

## 2015-08-28 NOTE — Progress Notes (Signed)
ANTIBIOTIC CONSULT NOTE -follow up  Pharmacy Consult for Vancomycin  Day 2 Indication: rule out sepsis/ UTI/bacteremia  Allergies  Allergen Reactions  . Codeine Itching and Rash    Patient Measurements: Height: '5\' 1"'$  (154.9 cm) Weight: 192 lb 1.6 oz (87.136 kg) IBW/kg (Calculated) : 47.8 Adjusted Body Weight: 63.5 kg  Vital Signs: Temp: 98 F (36.7 C) (12/18 0803) Temp Source: Oral (12/18 0803) BP: 155/117 mmHg (12/18 0803) Pulse Rate: 88 (12/18 0803) Intake/Output from previous day: 12/17 0701 - 12/18 0700 In: 3055.2 [P.O.:360; I.V.:2695.2] Out: 0  Intake/Output from this shift: Total I/O In: 580 [P.O.:480; IV Piggyback:100] Out: -   Labs:  Recent Labs  08/26/15 2325 08/28/15 0313  WBC 23.7* 20.6*  HGB 11.6* 9.5*  PLT 201 132*  CREATININE 1.69* 1.09*   Estimated Creatinine Clearance: 41.3 mL/min (by C-G formula based on Cr of 1.09).   Microbiology: Recent Results (from the past 720 hour(s))  Urine culture     Status: None (Preliminary result)   Collection Time: 08/27/15  3:46 AM  Result Value Ref Range Status   Specimen Description URINE, RANDOM  Final   Special Requests NONE  Final   Culture >=100,000 COLONIES/mL GRAM NEGATIVE RODS   Final   Report Status PENDING  Incomplete  Blood culture (routine x 2)     Status: None (Preliminary result)   Collection Time: 08/27/15  4:48 AM  Result Value Ref Range Status   Specimen Description BLOOD LEFT FACE  Final   Special Requests BOTTLES DRAWN AEROBIC AND ANAEROBIC 4CC  Final   Culture  Setup Time   Final    GRAM NEGATIVE RODS AEROBIC BOTTLE ONLY CRITICAL VALUE NOTED.  VALUE IS CONSISTENT WITH PREVIOUSLY REPORTED AND CALLED VALUE.    Culture GRAM NEGATIVE RODS IDENTIFICATION TO FOLLOW   Final   Report Status PENDING  Incomplete  Blood culture (routine x 2)     Status: None (Preliminary result)   Collection Time: 08/27/15  4:49 AM  Result Value Ref Range Status   Specimen Description BLOOD RIGHT ASSIST  CONTROL  Final   Special Requests BOTTLES DRAWN AEROBIC AND ANAEROBIC 4CC  Final   Culture  Setup Time   Final    GRAM NEGATIVE RODS AEROBIC BOTTLE ONLY Organism ID to follow CRITICAL RESULT CALLED TO, READ BACK BY AND VERIFIED WITH: MATT MCBANE AT 0205 ON 08/28/15 [facility] CONFIRMED BY [facility]    Culture GRAM NEGATIVE RODS AEROBIC BOTTLE ONLY   Final   Report Status PENDING  Incomplete  Blood Culture ID Panel (Reflexed)     Status: Abnormal   Collection Time: 08/27/15  4:49 AM  Result Value Ref Range Status   Enterococcus species NOT DETECTED NOT DETECTED Final   Listeria monocytogenes NOT DETECTED NOT DETECTED Final   Staphylococcus species NOT DETECTED NOT DETECTED Final   Staphylococcus aureus NOT DETECTED NOT DETECTED Final   Streptococcus species NOT DETECTED NOT DETECTED Final   Streptococcus agalactiae NOT DETECTED NOT DETECTED Final   Streptococcus pneumoniae NOT DETECTED NOT DETECTED Final   Streptococcus pyogenes NOT DETECTED NOT DETECTED Final   Acinetobacter baumannii NOT DETECTED NOT DETECTED Final   Enterobacteriaceae species NOT DETECTED NOT DETECTED Final   Enterobacter cloacae complex NOT DETECTED NOT DETECTED Final   Escherichia coli DETECTED (A) NOT DETECTED Final    Comment: CALLED CRITICAL RESULT TO MATT MCBANE AT 0205 ON 08/28/15 [facility] READ BACK   Klebsiella oxytoca NOT DETECTED NOT DETECTED Final   Klebsiella pneumoniae NOT DETECTED NOT  DETECTED Final   Proteus species NOT DETECTED NOT DETECTED Final   Serratia marcescens NOT DETECTED NOT DETECTED Final   Haemophilus influenzae NOT DETECTED NOT DETECTED Final   Neisseria meningitidis NOT DETECTED NOT DETECTED Final   Pseudomonas aeruginosa NOT DETECTED NOT DETECTED Final   Candida albicans NOT DETECTED NOT DETECTED Final   Candida glabrata NOT DETECTED NOT DETECTED Final   Candida krusei NOT DETECTED NOT DETECTED Final   Candida parapsilosis NOT DETECTED NOT DETECTED Final   Candida tropicalis NOT DETECTED NOT  DETECTED Final   Carbapenem resistance NOT DETECTED NOT DETECTED Final   Methicillin resistance NOT DETECTED NOT DETECTED Final   Vancomycin resistance NOT DETECTED NOT DETECTED Final    Medical History: Past Medical History  Diagnosis Date  . COPD (chronic obstructive pulmonary disease) ([place])   . Asthma   . Myocardial infarction [facility])      Assessment: 81 yo female here with pyelonephritis and sepsis starting on vancomycin. Also on Meropenem. Pt received vancomycin 1 g IV x1 on 12/17 at 0547.  Ke 0.021, half life 33 h, Vd 42 L  12/18: Bcx: GNR, Ucx: GNR.   Goal of Therapy:  Vancomycin trough level 15-20 mcg/ml  Plan:  Will order vancomycin 750 mg IV q36h starting 18 h after initial one time dose for stacked dosing.  Will order trough before 3rd dose (12/20 at 2330).  Will order SCr for AM - will need to follow renal function and adjust dosing as warranted.  Will need to continue to follow culture results.   12/18: Scr improved. Will change Vancomycin to '750mg'$  IV Q24h.  Ke 0.030  T1/2 23.1  Vd 44.45. Will check trough 12/20 at 2300- prior to 3rd dose of new regimen. Pt also on Meropenem    .Chinita Greenland PharmD Clinical Pharmacist 08/28/2015 ,2:59 PM

## 2015-08-28 NOTE — Progress Notes (Signed)
Pharmacy follow up abx note.  12/18 0215  [facility] returned E.coli, no KPC. Changed ceftriaxone to meropenem per conversation with MD. F/u c/s.

## 2015-08-28 NOTE — Progress Notes (Signed)
[place] [place] NAME: Debra [name]    MR#:  [id number]  DATE OF BIRTH:  [birth year]  SUBJECTIVE:  CHIEF COMPLAINT:   Chief Complaint  Patient presents with  . Shortness of Breath   Short of breath and coughing this morning. No abdominal pain. Feeling weak  REVIEW OF SYSTEMS:   Review of Systems  Constitutional: Positive for chills and malaise/fatigue. Negative for fever.  Respiratory: Positive for cough and shortness of breath.   Cardiovascular: Negative for chest pain and palpitations.  Gastrointestinal: Negative for nausea, vomiting and abdominal pain.  Genitourinary: Negative for dysuria.    DRUG ALLERGIES:   Allergies  Allergen Reactions  . Codeine Itching and Rash    VITALS:  Blood pressure 155/117, pulse 88, temperature 98 F (36.7 C), temperature source Oral, resp. rate 18, height '5\' 1"'$  (1.549 m), weight 87.136 kg (192 lb 1.6 oz), SpO2 96 %.  PHYSICAL EXAMINATION:  GENERAL:  81 y.o.-year-old patient lying in the bed with no acute distress. Pale LUNGS: Upper airway wheezes, cough productive of thick sputum, good air movement, no rhonchi or rales  CARDIOVASCULAR: S1, S2 normal. No murmurs, rubs, or gallops.  ABDOMEN: Soft, nontender, nondistended. Bowel sounds present. No organomegaly or mass.  EXTREMITIES: No pedal edema, cyanosis, or clubbing.  NEUROLOGIC: Cranial nerves II through XII are intact. Muscle strength 5/5 in all extremities. Sensation intact. Gait not checked.  PSYCHIATRIC: The patient is alert and oriented x 3.  SKIN: No obvious rash, lesion, or ulcer.    LABORATORY PANEL:   CBC  Recent Labs Lab 08/28/15 0313  WBC 20.6*  HGB 9.5*  HCT 28.6*  PLT 132*   ------------------------------------------------------------------------------------------------------------------  Chemistries   Recent Labs Lab 08/27/15 08/28/15 0313  NA  --  139  K  --  4.3  CL  --  102  CO2  --  33*   GLUCOSE  --  118*  BUN  --  27*  CREATININE  --  1.09*  CALCIUM  --  8.1*  AST 18  --   ALT 11*  --   ALKPHOS 50  --   BILITOT 2.1*  --    ------------------------------------------------------------------------------------------------------------------  Cardiac Enzymes  Recent Labs Lab 08/27/15  TROPONINI 0.05*   ------------------------------------------------------------------------------------------------------------------  RADIOLOGY:  Ct Abdomen Pelvis Wo Contrast  08/27/2015  CLINICAL DATA:  Cough and shortness of breath with back pain EXAM: CT CHEST, ABDOMEN AND PELVIS WITHOUT CONTRAST TECHNIQUE: Multidetector CT imaging of the chest, abdomen and pelvis was performed following the standard protocol without IV contrast. COMPARISON:  None. FINDINGS: CT CHEST FINDINGS Lungs are well aerated bilaterally. Diffuse emphysematous changes are seen. Some scattered nodular changes are noted particularly in the apices bilaterally. The largest of these measures 9 mm in the left upper lobe best seen on image number 9 of series for. No focal infiltrate or effusion is noted. The thoracic inlet demonstrates diffuse calcification of the thoracic aorta and its branches. Coronary calcifications are seen as well. No hilar or mediastinal adenopathy is noted. The osseous structures show changes of prior vertebral augmentation and prior median sternotomy. CT ABDOMEN AND PELVIS FINDINGS The gallbladder has been surgically removed. The liver, spleen, adrenal glands and pancreas are within normal limits. The kidneys are well visualized bilaterally and demonstrate an extrarenal pelvis on the right without obstructive change a 3 cm cyst is noted within the midportion of the left kidney. A nonobstructing left renal stone is noted measuring approximately 5  mm. Diffuse aortoiliac calcifications are identified. The appendix is not well visualized although no inflammatory changes are seen to suggest appendicitis.  Scattered diverticular changes noted. The bladder is well distended. The bony structures show vertebral augmentation at L4 with chronic compression deformity at L2. IMPRESSION: Apical parenchymal nodules within the lungs bilaterally. If the patient is at high risk for bronchogenic carcinoma, follow-up chest CT at 3-79month is recommended. If the patient is at low risk for bronchogenic carcinoma, follow-up chest CT at 6-12 months is recommended. This recommendation follows the consensus statement: Guidelines for Management of Small Pulmonary Nodules Detected on CT Scans: A Statement from the [place] published in Radiology 2005; 237:395-400. Left renal cystic change. Nonobstructing left renal stone Electronically Signed   By: MInez CatalinaM.D.   On: 08/27/2015 07:16   Dg Chest 2 View  08/26/2015  CLINICAL DATA:  Cough and shortness of breath. EXAM: CHEST  2 VIEW COMPARISON:  Radiographs 07/13/2015 FINDINGS: The lungs are hyperinflated with emphysema. Patient is post median sternotomy. Stable cardiomegaly and tortuous thoracic aorta. No confluent airspace disease, evident pulmonary edema, pleural effusion or pneumothorax. The bones are under mineralized, kyphoplasty within multiple vertebra. IMPRESSION: Stable hyperinflation.  No acute process. Electronically Signed   By: MJeb LeveringM.D.   On: 08/26/2015 23:33   Ct Chest Wo Contrast  08/27/2015  CLINICAL DATA:  Cough and shortness of breath with back pain EXAM: CT CHEST, ABDOMEN AND PELVIS WITHOUT CONTRAST TECHNIQUE: Multidetector CT imaging of the chest, abdomen and pelvis was performed following the standard protocol without IV contrast. COMPARISON:  None. FINDINGS: CT CHEST FINDINGS Lungs are well aerated bilaterally. Diffuse emphysematous changes are seen. Some scattered nodular changes are noted particularly in the apices bilaterally. The largest of these measures 9 mm in the left upper lobe best seen on image number 9 of series for. No  focal infiltrate or effusion is noted. The thoracic inlet demonstrates diffuse calcification of the thoracic aorta and its branches. Coronary calcifications are seen as well. No hilar or mediastinal adenopathy is noted. The osseous structures show changes of prior vertebral augmentation and prior median sternotomy. CT ABDOMEN AND PELVIS FINDINGS The gallbladder has been surgically removed. The liver, spleen, adrenal glands and pancreas are within normal limits. The kidneys are well visualized bilaterally and demonstrate an extrarenal pelvis on the right without obstructive change a 3 cm cyst is noted within the midportion of the left kidney. A nonobstructing left renal stone is noted measuring approximately 5 mm. Diffuse aortoiliac calcifications are identified. The appendix is not well visualized although no inflammatory changes are seen to suggest appendicitis. Scattered diverticular changes noted. The bladder is well distended. The bony structures show vertebral augmentation at L4 with chronic compression deformity at L2. IMPRESSION: Apical parenchymal nodules within the lungs bilaterally. If the patient is at high risk for bronchogenic carcinoma, follow-up chest CT at 3-658monthis recommended. If the patient is at low risk for bronchogenic carcinoma, follow-up chest CT at 6-12 months is recommended. This recommendation follows the consensus statement: Guidelines for Management of Small Pulmonary Nodules Detected on CT Scans: A Statement from the [place] published in Radiology 2005; 237:395-400. Left renal cystic change. Nonobstructing left renal stone Electronically Signed   By: MaInez Catalina.D.   On: 08/27/2015 07:16    EKG:   Orders placed or performed during the hospital encounter of 08/26/15  . EKG 12-Lead  . EKG 12-Lead  . ED EKG  . ED EKG  .  EKG    ASSESSMENT AND PLAN:   1) sepsis due to E. Coli bacteremia and uti - No pyelonephritis on CT abdomen - Continue with meropenem  and vancomycin until sensitivities return - Clinically improving  2) COPD - Short of breath wheezing and coughing today, mild COPD exacerbation -Continue supplemental oxygen as needed - Continue prednisone, DuoNeb, start [place]  3) CAD - No chest pain, continue Plavix, aspirin  4) hypertension: Controlled - Continue losartan  5) hyperlipidemia: Continue statin  6) tobacco abuse: Continue nicotine patch  All the records are reviewed and case discussed with Care Management/Social Workerr. Management plans discussed with the patient, family and they are in agreement.  CODE STATUS: Full   TOTAL TIME TAKING CARE OF THIS PATIENT: 30 minutes.  Greater than 50% of time spent in care coordination and counseling. POSSIBLE D/C IN 2-3 DAYS, DEPENDING ON CLINICAL CONDITION.   Myrtis Ser M.D on 08/28/2015 at 2:37 PM  Between 7am to 6pm - Pager - [phone]  After 6pm go to [email or web address] - password [email or web address] [facility]  Office  [phone]  CC: Primary care physician; Volanda Napoleon, MD

## 2015-08-29 LAB — BASIC METABOLIC PANEL
ANION GAP: 6 (ref 5–15)
BUN: 19 mg/dL (ref 6–20)
CHLORIDE: 103 mmol/L (ref 101–111)
CO2: 30 mmol/L (ref 22–32)
Calcium: 8.2 mg/dL — ABNORMAL LOW (ref 8.9–10.3)
Creatinine, Ser: 1.02 mg/dL — ABNORMAL HIGH (ref 0.44–1.00)
GFR calc non Af Amer: 51 mL/min — ABNORMAL LOW (ref >60)
GFR, EST AFRICAN AMERICAN: 59 mL/min — AB (ref >60)
Glucose, Bld: 137 mg/dL — ABNORMAL HIGH (ref 65–99)
POTASSIUM: 4.3 mmol/L (ref 3.5–5.1)
Sodium: 139 mmol/L (ref 135–145)

## 2015-08-29 LAB — CBC
HEMATOCRIT: 28.9 % — AB (ref 35.0–47.0)
HEMOGLOBIN: 9.7 g/dL — AB (ref 12.0–16.0)
MCH: 34.4 pg — ABNORMAL HIGH (ref 26.0–34.0)
MCHC: 33.4 g/dL (ref 32.0–36.0)
MCV: 103.2 fL — ABNORMAL HIGH (ref 80.0–100.0)
Platelets: 138 10*3/uL — ABNORMAL LOW (ref 150–440)
RBC: 2.8 MIL/uL — ABNORMAL LOW (ref 3.80–5.20)
RDW: 15.6 % — ABNORMAL HIGH (ref 11.5–14.5)
WBC: 10.1 10*3/uL (ref 3.6–11.0)

## 2015-08-29 LAB — URINE CULTURE: Culture: >=100000

## 2015-08-29 MED ORDER — LEVOFLOXACIN 500 MG PO TABS: 500.0000 mg | ORAL_TABLET | Freq: Every day | ORAL | Status: DC

## 2015-08-29 MED ORDER — IPRATROPIUM-ALBUTEROL 0.5-2.5 (3) MG/3ML IN SOLN
3.0000 mL | Freq: Three times a day (TID) | RESPIRATORY_TRACT | Status: DC
  Administered 2015-08-29 – 2015-08-30 (×3): 3 mL via RESPIRATORY_TRACT
  Filled 2015-08-29 (×2): qty 3

## 2015-08-29 MED ORDER — HYDRALAZINE HCL 20 MG/ML IJ SOLN
5.0000 mg | Freq: Four times a day (QID) | INTRAMUSCULAR | Status: DC | PRN
  Administered 2015-08-29 (×2): 5 mg via INTRAVENOUS
  Filled 2015-08-29 (×2): qty 1

## 2015-08-29 MED ORDER — LEVOFLOXACIN 500 MG PO TABS
500.0000 mg | ORAL_TABLET | Freq: Once | ORAL | Status: AC
Start: 2015-08-29 — End: 2015-08-29
  Administered 2015-08-29: 500 mg via ORAL
  Filled 2015-08-29: qty 1

## 2015-08-29 MED ORDER — LEVOFLOXACIN 250 MG PO TABS
250.0000 mg | ORAL_TABLET | Freq: Every day | ORAL | Status: DC
  Administered 2015-08-30: 250 mg via ORAL
  Filled 2015-08-29: qty 1

## 2015-08-29 MED ORDER — HYDROCHLOROTHIAZIDE 25 MG PO TABS
25.0000 mg | ORAL_TABLET | Freq: Every day | ORAL | Status: DC
  Administered 2015-08-29 – 2015-09-01 (×4): 25 mg via ORAL
  Filled 2015-08-29 (×4): qty 1

## 2015-08-29 MED ORDER — FUROSEMIDE 10 MG/ML IJ SOLN
40.0000 mg | Freq: Once | INTRAMUSCULAR | Status: AC
  Administered 2015-08-29: 40 mg via INTRAVENOUS
  Filled 2015-08-29: qty 4

## 2015-08-29 NOTE — Care Management Important Message (Signed)
Important Message  Patient Details  Name: Debra [name] MRN: [id number] Date of Birth: September 27, 1932   Medicare Important Message Given:  Yes    Daeveon Zweber A, RN 08/29/2015, 7:57 AM

## 2015-08-29 NOTE — Consult Note (Signed)
ANTIBIOTIC CONSULT NOTE - INITIAL  Pharmacy Consult for levofloxacin Indication: UTI/bacteremia  Allergies  Allergen Reactions  . Codeine Itching and Rash    Patient Measurements: Height: '5\' 1"'$  (154.9 cm) Weight: 188 lb 3.2 oz (85.367 kg) IBW/kg (Calculated) : 47.8 Adjusted Body Weight:   Vital Signs: Temp: 98.4 F (36.9 C) (12/19 0738) Temp Source: Oral (12/19 0738) BP: 181/77 mmHg (12/19 1006) Pulse Rate: 91 (12/19 1006) Intake/Output from previous day: 12/18 0701 - 12/19 0700 In: 820 [P.O.:720; IV Piggyback:100] Out: -  Intake/Output from this shift:    Labs:  Recent Labs  08/26/15 2325 08/28/15 0313 08/29/15 0434  WBC 23.7* 20.6* 10.1  HGB 11.6* 9.5* 9.7*  PLT 201 132* 138*  CREATININE 1.69* 1.09* 1.02*   Estimated Creatinine Clearance: 43.6 mL/min (by C-G formula based on Cr of 1.02). No results for input(s): VANCOTROUGH, VANCOPEAK, VANCORANDOM, GENTTROUGH, GENTPEAK, GENTRANDOM, TOBRATROUGH, TOBRAPEAK, TOBRARND, AMIKACINPEAK, AMIKACINTROU, AMIKACIN in the last 72 hours.   Microbiology: Recent Results (from the past 720 hour(s))  Urine culture     Status: None   Collection Time: 08/27/15  3:46 AM  Result Value Ref Range Status   Specimen Description URINE, RANDOM  Final   Special Requests NONE  Final   Culture >=100,000 COLONIES/mL ESCHERICHIA COLI  Final   Report Status 08/29/2015 FINAL  Final   Organism ID, Bacteria ESCHERICHIA COLI  Final      Susceptibility   Escherichia coli - MIC*    AMPICILLIN >=32 RESISTANT Resistant     CEFTAZIDIME <=1 SENSITIVE Sensitive     CEFAZOLIN <=4 SENSITIVE Sensitive     CEFTRIAXONE <=1 SENSITIVE Sensitive     CIPROFLOXACIN <=0.25 SENSITIVE Sensitive     GENTAMICIN <=1 SENSITIVE Sensitive     IMIPENEM <=0.25 SENSITIVE Sensitive     TRIMETH/SULFA <=20 SENSITIVE Sensitive     PIP/TAZO Value in next row Sensitive      SENSITIVE<=4    * >=100,000 COLONIES/mL ESCHERICHIA COLI  Blood culture (routine x 2)     Status:  None (Preliminary result)   Collection Time: 08/27/15  4:48 AM  Result Value Ref Range Status   Specimen Description BLOOD LEFT FACE  Final   Special Requests BOTTLES DRAWN AEROBIC AND ANAEROBIC 4CC  Final   Culture  Setup Time   Final    GRAM NEGATIVE RODS AEROBIC BOTTLE ONLY CRITICAL VALUE NOTED.  VALUE IS CONSISTENT WITH PREVIOUSLY REPORTED AND CALLED VALUE.    Culture ESCHERICHIA COLI IDENTIFICATION TO FOLLOW   Final   Report Status PENDING  Incomplete  Blood culture (routine x 2)     Status: None (Preliminary result)   Collection Time: 08/27/15  4:49 AM  Result Value Ref Range Status   Specimen Description BLOOD RIGHT ASSIST CONTROL  Final   Special Requests BOTTLES DRAWN AEROBIC AND ANAEROBIC 4CC  Final   Culture  Setup Time   Final    GRAM NEGATIVE RODS AEROBIC BOTTLE ONLY CRITICAL RESULT CALLED TO, READ BACK BY AND VERIFIED WITH: MATT MCBANE AT [place] ON 08/28/15 [facility] CONFIRMED BY [facility]    Culture   Final    ESCHERICHIA COLI AEROBIC BOTTLE ONLY SUSCEPTIBILITIES TO FOLLOW    Report Status PENDING  Incomplete  Blood Culture ID Panel (Reflexed)     Status: Abnormal   Collection Time: 08/27/15  4:49 AM  Result Value Ref Range Status   Enterococcus species NOT DETECTED NOT DETECTED Final   Listeria monocytogenes NOT DETECTED NOT DETECTED Final   Staphylococcus species NOT  DETECTED NOT DETECTED Final   Staphylococcus aureus NOT DETECTED NOT DETECTED Final   Streptococcus species NOT DETECTED NOT DETECTED Final   Streptococcus agalactiae NOT DETECTED NOT DETECTED Final   Streptococcus pneumoniae NOT DETECTED NOT DETECTED Final   Streptococcus pyogenes NOT DETECTED NOT DETECTED Final   Acinetobacter baumannii NOT DETECTED NOT DETECTED Final   Enterobacteriaceae species NOT DETECTED NOT DETECTED Final   Enterobacter cloacae complex NOT DETECTED NOT DETECTED Final   Escherichia coli DETECTED (A) NOT DETECTED Final    Comment: CALLED CRITICAL RESULT TO MATT MCBANE AT 0205 ON  08/28/15 [facility] READ BACK   Klebsiella oxytoca NOT DETECTED NOT DETECTED Final   Klebsiella pneumoniae NOT DETECTED NOT DETECTED Final   Proteus species NOT DETECTED NOT DETECTED Final   Serratia marcescens NOT DETECTED NOT DETECTED Final   Haemophilus influenzae NOT DETECTED NOT DETECTED Final   Neisseria meningitidis NOT DETECTED NOT DETECTED Final   Pseudomonas aeruginosa NOT DETECTED NOT DETECTED Final   Candida albicans NOT DETECTED NOT DETECTED Final   Candida glabrata NOT DETECTED NOT DETECTED Final   Candida krusei NOT DETECTED NOT DETECTED Final   Candida parapsilosis NOT DETECTED NOT DETECTED Final   Candida tropicalis NOT DETECTED NOT DETECTED Final   Carbapenem resistance NOT DETECTED NOT DETECTED Final   Methicillin resistance NOT DETECTED NOT DETECTED Final   Vancomycin resistance NOT DETECTED NOT DETECTED Final    Medical History: Past Medical History  Diagnosis Date  . COPD (chronic obstructive pulmonary disease) ([place])   . Asthma   . Myocardial infarction ([facility])     Medications:  Scheduled:  . atorvastatin  10 mg Oral QHS  . clopidogrel  75 mg Oral Daily  . docusate sodium  100 mg Oral BID  . heparin  5,000 Units Subcutaneous 3 times per day  . hydrochlorothiazide  25 mg Oral Daily  . HYDROcodone-acetaminophen  1 tablet Oral Q12H  . ipratropium-albuterol  3 mL Nebulization Q6H  . [START ON 08/30/2015] levofloxacin  250 mg Oral Daily  . levofloxacin  500 mg Oral Once  . losartan  50 mg Oral Daily  . mometasone-formoterol  2 puff Inhalation BID  . pantoprazole  40 mg Oral Daily  . predniSONE  50 mg Oral Q breakfast  . sodium chloride  3 mL Intravenous Q12H  . traZODone  100 mg Oral QHS   Assessment: Pt is a 81 year old female with a Ecoli UTI, sensitive to cipro, and Ecoli bacteremia. Levofloxacin po ordered  Goal of Therapy:  resolution of infection  Plan:  Follow up culture results Due to pt renal function will give levofloxacin '500mg'$  once then '250mg'$   daily. pharmacy to continue to monitor for any changes in renal function  Debra [name] D Kashae Carstens 08/29/2015,1:54 PM

## 2015-08-29 NOTE — Progress Notes (Signed)
Patient has elevated blood pressure, paged MD to advise.  Only Cozaar prescribed for routine use.  Pending call back.

## 2015-08-29 NOTE — Progress Notes (Signed)
Patient complained of back pain throughout shift until she was transferred to chair.  While in bed was given pain medication that patient states helped only slightly.  She became sob and very anxious this shift and would request her inhaler more than allowed and oxygen levels within normal limits..  Xanax PRN given and patient calmed down.  Reports pain minimal since being up in chair.

## 2015-08-29 NOTE — Progress Notes (Signed)
Physical Therapy Treatment Patient Details Name: Debra [name] MRN: [id number] DOB: [birth year] Today's Date: 08/29/2015    History of Present Illness Pt is a 81yo white female with a PMH: CPOD, emphysema, CAD, HTN, HLD, s/p sternotomy, who is on 2L O2 at home for ~6 months. Pt's youngest son is at bedside assisting with history as needed. Pt reports 3 LOC in november resultant in falls in home that remain insidious at this time. Pt has chronic back pain for which she takes PRN meds, and very limited mobility status for the past 8 months, walking exclusively in the home with rollator, often desaturating to 88% with short household distances. Pt [facility] after worsening SOB x1 day, found also to have tachypnea and tachycardia, admitted for sepsis, and found to have active pyelonephritis.      PT Comments    Pt denies pain currently in back, but notes was medicated earlier for pain. O2 saturation remains above 92% throughout treatment on 2.5 liters O2; however, pt becomes short of breath with seated exercises edge of bed and transfer to chair. Pt requires rest breaks between sets of exercise for breathing management. Pt did not wish to walk further today, but agreeable to up in the chair. Pt will need rolling walker in room for ambulation. Transfers with hand held assist with mild unsteadiness. Continue PT to progress strength and endurance with safe O2 levels to improve functional mobility and allow for safe return home.    Follow Up Recommendations  SNF     Equipment Recommendations       Recommendations for Other Services       Precautions / Restrictions Restrictions Weight Bearing Restrictions: No    Mobility  Bed Mobility Overal bed mobility: Modified Independent Bed Mobility: Supine to Sit     Supine to sit: Modified independent (Device/Increase time)     General bed mobility comments: use of rails and increased time/effort  Transfers Overall transfer level: Needs  assistance Equipment used: 1 person hand held assist Transfers: Sit to/from Stand Sit to Stand: Min assist         General transfer comment: good hand placement  Ambulation/Gait Ambulation/Gait assistance: Min guard Ambulation Distance (Feet): 4 Feet Assistive device: 1 person hand held assist Gait Pattern/deviations: Step-to pattern (bed to chair)     General Gait Details: Mild unsteadiness; becomes SOB recovers with O2 sats above 92%; subjectively recovers in 2 minutes   Stairs            Wheelchair Mobility    Modified Rankin (Stroke Patients Only)       Balance                                    Cognition Arousal/Alertness: Awake/alert Behavior During Therapy: WFL for tasks assessed/performed Overall Cognitive Status: Within Functional Limits for tasks assessed                      Exercises General Exercises - Lower Extremity Long Arc Quad: AROM;Both;20 reps;Seated Hip ABduction/ADduction: AROM;Both;20 reps;Seated Hip Flexion/Marching: AROM;Both;20 reps;Seated Toe Raises: AROM;Both;20 reps;Seated Heel Raises: AROM;Both;20 reps;Seated    General Comments        Pertinent Vitals/Pain Pain Assessment: No/denies pain Pain Location: low back Pain Intervention(s): Limited activity within patient's tolerance;Monitored during session;Premedicated before session    Home Living  Prior Function            PT Goals (current goals can now be found in the care plan section) Progress towards PT goals: Progressing toward goals    Frequency  Min 2X/week    PT Plan Current plan remains appropriate    Co-evaluation             End of Session Equipment Utilized During Treatment: Gait belt Activity Tolerance: Patient tolerated treatment well Patient left: in chair;with call bell/phone within reach;with chair alarm set     Time: 4920-1007 PT Time Calculation (min) (ACUTE ONLY): 25  min  Charges:  $Gait Training: 8-22 mins $Therapeutic Exercise: 8-22 mins                    G Codes:      Charlaine Dalton 08/29/2015, 4:08 PM

## 2015-08-29 NOTE — Progress Notes (Signed)
[place] [facility] [place] NAME: Debra [name]    MR#:  [id number]  DATE OF BIRTH:  Nov 04, 1932  SUBJECTIVE:  CHIEF COMPLAINT:   Chief Complaint  Patient presents with  . Shortness of Breath   Continues to be short of breath  REVIEW OF SYSTEMS:   Review of Systems  Constitutional: Positive for chills and malaise/fatigue. Negative for fever.  Respiratory: Positive for cough and shortness of breath.   Cardiovascular: Negative for chest pain and palpitations.  Gastrointestinal: Negative for nausea, vomiting and abdominal pain.  Genitourinary: Negative for dysuria.    DRUG ALLERGIES:   Allergies  Allergen Reactions  . Codeine Itching and Rash    VITALS:  Blood pressure 181/77, pulse 91, temperature 98.4 F (36.9 C), temperature source Oral, resp. rate 18, height '5\' 1"'$  (1.549 m), weight 85.367 kg (188 lb 3.2 oz), SpO2 93 %.  PHYSICAL EXAMINATION:  GENERAL:  81 y.o.-year-old patient lying in the bed with no acute distress. Pale LUNGS: Upper airway wheezes, cough productive of thick sputum, good air movement, no rhonchi or rales  CARDIOVASCULAR: S1, S2 normal. No murmurs, rubs, or gallops.  ABDOMEN: Soft, nontender, nondistended. Bowel sounds present. No organomegaly or mass.  EXTREMITIES: No pedal edema, cyanosis, or clubbing.  NEUROLOGIC: Cranial nerves II through XII are intact. Muscle strength 5/5 in all extremities. Sensation intact. Gait not checked.  PSYCHIATRIC: The patient is alert and oriented x 3.  SKIN: No obvious rash, lesion, or ulcer.    LABORATORY PANEL:   CBC  Recent Labs Lab 08/29/15 0434  WBC 10.1  HGB 9.7*  HCT 28.9*  PLT 138*   ------------------------------------------------------------------------------------------------------------------  Chemistries   Recent Labs Lab 08/27/15  08/29/15 0434  NA  --   < > 139  K  --   < > 4.3  CL  --   < > 103  CO2  --   < > 30  GLUCOSE  --   < > 137*   BUN  --   < > 19  CREATININE  --   < > 1.02*  CALCIUM  --   < > 8.2*  AST 18  --   --   ALT 11*  --   --   ALKPHOS 50  --   --   BILITOT 2.1*  --   --   < > = values in this interval not displayed. ------------------------------------------------------------------------------------------------------------------  Cardiac Enzymes  Recent Labs Lab 08/27/15  TROPONINI 0.05*   ------------------------------------------------------------------------------------------------------------------  RADIOLOGY:  Dg Chest Port 1 View  08/28/2015  CLINICAL DATA:  Shortness of breath.  Urinary tract infection. EXAM: PORTABLE CHEST 1 VIEW COMPARISON:  08/27/2015 FINDINGS: The heart size is enlarged. Previous median sternotomy and CABG procedure. Aortic atherosclerosis noted. There is pulmonary vascular congestion noted. No edema. No airspace consolidation. IMPRESSION: 1. Cardiac enlargement and pulmonary vascular congestion. Electronically Signed   By: Kerby Moors M.D.   On: 08/28/2015 15:30    EKG:   Orders placed or performed during the hospital encounter of 08/26/15  . EKG 12-Lead  . EKG 12-Lead  . ED EKG  . ED EKG  . EKG    ASSESSMENT AND PLAN:   1) sepsis due to E. Coli bacteremia and uti - No pyelonephritis on CT abdomen - pan sensitive, dc meropenem and vancomycin start oral levaquin  2) COPD - Short of breath wheezing and coughing today, mild COPD exacerbation -Continue supplemental oxygen as needed - Continue prednisone,  DuoNeb, start [place] - CXR with some pulmonary congestion- encourage sitting up in chair, lasix x 1, start hctz to help with BP as well  3) CAD - No chest pain, continue Plavix, aspirin  4) hypertension: Controlled - Continue losartan  5) hyperlipidemia: Continue statin  6) tobacco abuse: Continue nicotine patch  All the records are reviewed and case discussed with Care Management/Social Workerr. Management plans discussed with the patient, family  and they are in agreement. Spoke with patient and her daughter at the bedside.  CODE STATUS: Full   TOTAL TIME TAKING CARE OF THIS PATIENT: 30 minutes.  Greater than 50% of time spent in care coordination and counseling. POSSIBLE D/C IN 2-3 DAYS, DEPENDING ON CLINICAL CONDITION.   Myrtis Ser M.D on 08/29/2015 at 1:43 PM  Between 7am to 6pm - Pager - [phone]  After 6pm go to [email or web address] - password [email or web address] [place] [facility]  Office  [phone]  CC: Primary care physician; Volanda Napoleon, MD

## 2015-08-29 NOTE — Plan of Care (Signed)
Problem: Pain Managment: Goal: General experience of comfort will improve Outcome: Not Progressing Patient complaining of pain entire shift

## 2015-08-30 ENCOUNTER — Inpatient Hospital Stay: Payer: Medicare Other

## 2015-08-30 LAB — CBC
HEMATOCRIT: 34.3 % — AB (ref 35.0–47.0)
HEMOGLOBIN: 11.4 g/dL — AB (ref 12.0–16.0)
MCH: 33.8 pg (ref 26.0–34.0)
MCHC: 33.2 g/dL (ref 32.0–36.0)
MCV: 101.7 fL — ABNORMAL HIGH (ref 80.0–100.0)
Platelets: 195 10*3/uL (ref 150–440)
RBC: 3.37 MIL/uL — ABNORMAL LOW (ref 3.80–5.20)
RDW: 15.6 % — AB (ref 11.5–14.5)
WBC: 12.4 10*3/uL — ABNORMAL HIGH (ref 3.6–11.0)

## 2015-08-30 LAB — BASIC METABOLIC PANEL
Anion gap: 6 (ref 5–15)
BUN: 18 mg/dL (ref 6–20)
CALCIUM: 9 mg/dL (ref 8.9–10.3)
CHLORIDE: 95 mmol/L — AB (ref 101–111)
CO2: 37 mmol/L — AB (ref 22–32)
CREATININE: 1 mg/dL (ref 0.44–1.00)
GFR calc non Af Amer: 52 mL/min — ABNORMAL LOW (ref >60)
GLUCOSE: 94 mg/dL (ref 65–99)
Potassium: 3.5 mmol/L (ref 3.5–5.1)
Sodium: 138 mmol/L (ref 135–145)

## 2015-08-30 MED ORDER — LEVOFLOXACIN 500 MG PO TABS
750.0000 mg | ORAL_TABLET | ORAL | Status: DC
  Administered 2015-09-01: 750 mg via ORAL
  Filled 2015-08-30: qty 2
  Filled 2015-08-30: qty 1

## 2015-08-30 MED ORDER — LEVOFLOXACIN 500 MG PO TABS
500.0000 mg | ORAL_TABLET | Freq: Once | ORAL | Status: AC
  Administered 2015-08-30: 500 mg via ORAL
  Filled 2015-08-30: qty 1

## 2015-08-30 MED ORDER — IPRATROPIUM-ALBUTEROL 0.5-2.5 (3) MG/3ML IN SOLN
3.0000 mL | Freq: Four times a day (QID) | RESPIRATORY_TRACT | Status: DC
  Administered 2015-08-30 – 2015-08-31 (×3): 3 mL via RESPIRATORY_TRACT
  Filled 2015-08-30 (×3): qty 3

## 2015-08-30 NOTE — Progress Notes (Signed)
[facility] [place] NAME: Debra [name]    MR#:  [id number]  DATE OF BIRTH:  March 12, 1933  SUBJECTIVE:  CHIEF COMPLAINT:   Chief Complaint  Patient presents with  . Shortness of Breath   Continues to be short of breath, wheezing and has coughing.  REVIEW OF SYSTEMS:   Review of Systems  Constitutional: Positive for chills and malaise/fatigue. Negative for fever.  Respiratory: Positive for cough and shortness of breath.   Cardiovascular: Negative for chest pain and palpitations.  Gastrointestinal: Negative for nausea, vomiting and abdominal pain.  Genitourinary: Negative for dysuria.    DRUG ALLERGIES:   Allergies  Allergen Reactions  . Codeine Itching and Rash    VITALS:  Blood pressure 177/77, pulse 92, temperature 98 F (36.7 C), temperature source Oral, resp. rate 18, height '5\' 1"'$  (1.549 m), weight 80.786 kg (178 lb 1.6 oz), SpO2 93 %.  PHYSICAL EXAMINATION:  GENERAL:  81 y.o.-year-old patient lying in the bed with no acute distress. Pale LUNGS: Upper airway wheezes, cough productive of thick sputum, good air movement, no rhonchi or rales  CARDIOVASCULAR: S1, S2 normal. No murmurs, rubs, or gallops.  ABDOMEN: Soft, nontender, nondistended. Bowel sounds present. No organomegaly or mass.  EXTREMITIES: No pedal edema, cyanosis, or clubbing.  NEUROLOGIC: Cranial nerves II through XII are intact. Muscle strength 5/5 in all extremities. Sensation intact. Gait not checked.  PSYCHIATRIC: The patient is alert and oriented x 3.  SKIN: No obvious rash, lesion, or ulcer.    LABORATORY PANEL:   CBC  Recent Labs Lab 08/30/15 0741  WBC 12.4*  HGB 11.4*  HCT 34.3*  PLT 195   ------------------------------------------------------------------------------------------------------------------  Chemistries   Recent Labs Lab 08/27/15  08/30/15 0741  NA  --   < > 138  K  --   < > 3.5  CL  --   < > 95*  CO2  --   < > 37*   GLUCOSE  --   < > 94  BUN  --   < > 18  CREATININE  --   < > 1.00  CALCIUM  --   < > 9.0  AST 18  --   --   ALT 11*  --   --   ALKPHOS 50  --   --   BILITOT 2.1*  --   --   < > = values in this interval not displayed. ------------------------------------------------------------------------------------------------------------------  Cardiac Enzymes  Recent Labs Lab 08/27/15  TROPONINI 0.05*   ------------------------------------------------------------------------------------------------------------------  RADIOLOGY:  Dg Chest 2 View  08/30/2015  CLINICAL DATA:  81 year old female admitted 3 days ago for shortness of breath, urinary tract infection. Current history of COPD, hypoxia. Initial encounter. EXAM: CHEST  2 VIEW COMPARISON:  08/28/2015 and earlier. FINDINGS: Acute on chronic reticulonodular opacity in the right mid and lower lung. Underlying coarse chronic pulmonary interstitial markings. Stable lung volumes. No pneumothorax or pleural effusion. No consolidation. Stable cardiac size and mediastinal contours. Sequelae of CABG. IMPRESSION: 1. Acute viral/atypical infection in the right lung superimposed on chronic lung disease. 2. No pleural effusion. Electronically Signed   By: Genevie Ann M.D.   On: 08/30/2015 08:03    EKG:   Orders placed or performed during the hospital encounter of 08/26/15  . EKG 12-Lead  . EKG 12-Lead  . ED EKG  . ED EKG  . EKG    ASSESSMENT AND PLAN:   1) sepsis due to E. Coli bacteremia  and uti - No pyelonephritis on CT abdomen - pan sensitive, meropenem and vancomycin DC'd on 12/19 started on oral levaquin - Repeat blood cultures drawn  2) COPD - Still Short of breath wheezing and coughing today, mild COPD exacerbation -Continue supplemental oxygen as needed - Continue prednisone, DuoNeb, start [place]  3) CAD - No chest pain, continue Plavix, aspirin  4) hypertension: Controlled - Continue losartan, have added hydrochlorothiazide  5)  hyperlipidemia: Continue statin  6) tobacco abuse: Continue nicotine patch  7. Atypical pneumonia: Chest x-ray showing atypical pneumonia versus viral process. Levaquin should cover. Will check urine Legionella antigen  All the records are reviewed and case discussed with Care Management/Social Workerr. Management plans discussed with the patient, family and they are in agreement. Spoke with patient and her daughter at the bedside.  CODE STATUS: Full   TOTAL TIME TAKING CARE OF THIS PATIENT: 30 minutes.  Greater than 50% of time spent in care coordination and counseling. POSSIBLE D/C IN 2-3 DAYS, DEPENDING ON CLINICAL CONDITION.   Myrtis Ser M.D on 08/30/2015 at 3:01 PM  Between 7am to 6pm - Pager - [phone]  After 6pm go to [email or web address] - password [email or web address] [facility]  Office  [phone]  CC: Primary care physician; Volanda Napoleon, MD

## 2015-08-30 NOTE — Progress Notes (Signed)
Patient complains of feeling very anxious and wants anxiety medication to calm her.  PRN Order given.

## 2015-08-30 NOTE — Progress Notes (Signed)
Pharmacy Antibiotic Follow-up Note  Debra [name] is a 81 y.o. year-old female admitted on 08/26/2015.  The patient is currently on day 2 of Levaquin '250mg'$  PO daily for E.coli UTI and bacteremia.  Assessment/Plan: Patient also with possible pneumonia on CXR and WBC is trending up. Discussed with Dr. Volanda Napoleon and will increase Levaquin dosing. The dose of Levaquin will be adjusted to '750mg'$  PO q48h based on renal function.   Temp (24hrs), Avg:97.7 F (36.5 C), Min:97.4 F (36.3 C), Max:98 F (36.7 C)   Recent Labs Lab 08/26/15 2325 08/28/15 0313 08/29/15 0434 08/30/15 0741  WBC 23.7* 20.6* 10.1 12.4*    Recent Labs Lab 08/26/15 2325 08/28/15 0313 08/29/15 0434 08/30/15 0741  CREATININE 1.69* 1.09* 1.02* 1.00   Estimated Creatinine Clearance: 43.2 mL/min (by C-G formula based on Cr of 1).    Allergies  Allergen Reactions  . Codeine Itching and Rash    Thank you for allowing pharmacy to be a part of this patient's care.  Paulina Fusi, PharmD, BCPS 08/30/2015 2:22 PM

## 2015-08-31 LAB — BASIC METABOLIC PANEL
Anion gap: 6 (ref 5–15)
BUN: 19 mg/dL (ref 6–20)
CALCIUM: 8.6 mg/dL — AB (ref 8.9–10.3)
CHLORIDE: 94 mmol/L — AB (ref 101–111)
CO2: 38 mmol/L — AB (ref 22–32)
CREATININE: 1.17 mg/dL — AB (ref 0.44–1.00)
GFR calc Af Amer: 50 mL/min — ABNORMAL LOW (ref >60)
GFR calc non Af Amer: 43 mL/min — ABNORMAL LOW (ref >60)
Glucose, Bld: 113 mg/dL — ABNORMAL HIGH (ref 65–99)
Potassium: 3.1 mmol/L — ABNORMAL LOW (ref 3.5–5.1)
Sodium: 138 mmol/L (ref 135–145)

## 2015-08-31 LAB — CBC
HEMATOCRIT: 31.8 % — AB (ref 35.0–47.0)
HEMOGLOBIN: 10.6 g/dL — AB (ref 12.0–16.0)
MCH: 33.8 pg (ref 26.0–34.0)
MCHC: 33.3 g/dL (ref 32.0–36.0)
MCV: 101.5 fL — ABNORMAL HIGH (ref 80.0–100.0)
Platelets: 193 10*3/uL (ref 150–440)
RBC: 3.13 MIL/uL — ABNORMAL LOW (ref 3.80–5.20)
RDW: 14.8 % — AB (ref 11.5–14.5)
WBC: 9.3 10*3/uL (ref 3.6–11.0)

## 2015-08-31 LAB — EXPECTORATED SPUTUM ASSESSMENT W REFEX TO RESP CULTURE

## 2015-08-31 LAB — EXPECTORATED SPUTUM ASSESSMENT W GRAM STAIN, RFLX TO RESP C

## 2015-08-31 MED ORDER — ENOXAPARIN SODIUM 40 MG/0.4ML ~~LOC~~ SOLN
40.0000 mg | SUBCUTANEOUS | Status: DC
  Administered 2015-08-31: 40 mg via SUBCUTANEOUS
  Filled 2015-08-31: qty 0.4

## 2015-08-31 MED ORDER — IPRATROPIUM-ALBUTEROL 0.5-2.5 (3) MG/3ML IN SOLN: 3.0000 mL | Freq: Two times a day (BID) | RESPIRATORY_TRACT | Status: DC

## 2015-08-31 MED ORDER — POTASSIUM CHLORIDE CRYS ER 20 MEQ PO TBCR
40.0000 meq | EXTENDED_RELEASE_TABLET | ORAL | Status: AC
  Administered 2015-08-31 (×2): 40 meq via ORAL
  Filled 2015-08-31 (×3): qty 2

## 2015-08-31 MED ORDER — FLUOXETINE HCL 20 MG PO CAPS
20.0000 mg | ORAL_CAPSULE | Freq: Every day | ORAL | Status: DC
  Administered 2015-08-31 – 2015-09-01 (×2): 20 mg via ORAL
  Filled 2015-08-31 (×2): qty 1

## 2015-08-31 MED ORDER — ALPRAZOLAM 0.5 MG PO TABS
0.5000 mg | ORAL_TABLET | Freq: Three times a day (TID) | ORAL | Status: DC | PRN
  Administered 2015-09-01 (×2): 0.5 mg via ORAL
  Filled 2015-08-31 (×2): qty 1

## 2015-08-31 MED ORDER — FUROSEMIDE 10 MG/ML IJ SOLN
40.0000 mg | Freq: Once | INTRAMUSCULAR | Status: AC
  Administered 2015-08-31: 40 mg via INTRAVENOUS
  Filled 2015-08-31: qty 4

## 2015-08-31 MED ORDER — IPRATROPIUM-ALBUTEROL 0.5-2.5 (3) MG/3ML IN SOLN
3.0000 mL | Freq: Four times a day (QID) | RESPIRATORY_TRACT | Status: DC
  Administered 2015-08-31 – 2015-09-01 (×4): 3 mL via RESPIRATORY_TRACT
  Filled 2015-08-31 (×4): qty 3

## 2015-08-31 NOTE — Progress Notes (Signed)
Physical Therapy Treatment Patient Details Name: Debra [name] MRN: [id number] DOB: [birth year] Today's Date: 08/31/2015    History of Present Illness Pt is a 81yo white female with a PMH: CPOD, emphysema, CAD, HTN, HLD, s/p sternotomy, who is on 2L O2 at home for ~6 months. Pt's youngest son is at bedside assisting with history as needed. Pt reports 3 LOC in november resultant in falls in home that remain insidious at this time. Pt has chronic back pain for which she takes PRN meds, and very limited mobility status for the past 8 months, walking exclusively in the home with rollator, often desaturating to 88% with short household distances. Pt came to [facility] after worsening SOB x1 day, found also to have tachypnea and tachycardia, admitted for sepsis, and found to have active pyelonephritis.      PT Comments    Pt eager for PT and to get out of bed. Pt stands with Min guard; however, has immediate need to sit due to shortness of breath and back pain. Reports pain in back after stand (8/10). O2 saturation on 4 liters initially 92%; post 16 foot walk, decreased to 85% with increase in heart rate from 88 beats per minute to 116. Several minute seated rest required with pursed lip breathing before return to O2 saturation of 93% and heart rate 90 beats per minute. Pt did not wish further stand or exercise. Pt comfortable up in chair. Encouraged pt to remain in chair for the afternoon. Continue PT for progression of strength, endurance and ambulation with safe O2 saturation levels to improve functional mobility.   Follow Up Recommendations  SNF     Equipment Recommendations  None recommended by PT    Recommendations for Other Services       Precautions / Restrictions Restrictions Weight Bearing Restrictions: No    Mobility  Bed Mobility Overal bed mobility: Modified Independent Bed Mobility: Supine to Sit     Supine to sit: Modified independent (Device/Increase time)     General bed  mobility comments: use of rails  Transfers Overall transfer level: Needs assistance Equipment used: Rolling walker (2 wheeled) Transfers: Sit to/from Stand Sit to Stand: Min guard (for safety; pt immediately SOB. Performed 2x)         General transfer comment: Pt stands with need to sit again due to SOB and back pain. Second stand pt tolerates ambulation  Ambulation/Gait Ambulation/Gait assistance: Min guard Ambulation Distance (Feet): 16 Feet Assistive device: Rolling walker (2 wheeled) Gait Pattern/deviations: Step-through pattern;Trunk flexed   Gait velocity interpretation: at or above normal speed for age/gender General Gait Details: SOB throughout ambulation on 4L O2 with decrease in O2 saturation to 85% in 15 foot walk   Stairs            Wheelchair Mobility    Modified Rankin (Stroke Patients Only)       Balance                                    Cognition Arousal/Alertness: Awake/alert Behavior During Therapy: WFL for tasks assessed/performed Overall Cognitive Status: Within Functional Limits for tasks assessed                      Exercises      General Comments        Pertinent Vitals/Pain Pain Assessment: 0-10 Pain Score: 8  Pain Location: low back with stand;  minimal at rest Pain Intervention(s): Monitored during session;Limited activity within patient's tolerance;Repositioned    Home Living                      Prior Function            PT Goals (current goals can now be found in the care plan section) Progress towards PT goals: Progressing toward goals (slowly)    Frequency  Min 2X/week    PT Plan Current plan remains appropriate    Co-evaluation             End of Session Equipment Utilized During Treatment: Gait belt Activity Tolerance: Patient limited by fatigue (limited by decreased O2 saturation) Patient left: in chair;with call bell/phone within reach;with chair alarm set (refused  leg pumps)     Time: 1355-1416 PT Time Calculation (min) (ACUTE ONLY): 21 min  Charges:  $Gait Training: 8-22 mins                    G Codes:      Charlaine Dalton 08/31/2015, 2:25 PM

## 2015-08-31 NOTE — Progress Notes (Signed)
Patient verbally reports improvement with breathing. Was on 4L via [place], titrated to 2L and patient is tolerating well. Patient has been witnessed to speaking on the phone and conversing with staff without any exacerbation. Patient verbally expresses desire to go home.

## 2015-08-31 NOTE — Progress Notes (Signed)
Initial Nutrition Assessment   INTERVENTION:   Meals and Snacks: Cater to patient preferences to optimize nutritional intake   NUTRITION DIAGNOSIS:   No nutrition diagnosis at this time  GOAL:   Patient will meet greater than or equal to 90% of their needs  MONITOR:    (Energy Intake, Electrolyte and renal Profile, Anthropometrics, Digestive System)  REASON FOR ASSESSMENT:   LOS    ASSESSMENT:   Pt admitted with SOB seconary to mild COPD exacerbation and sepsis secondary to e.coli bacteremia and UTI. Per MD note pt with atypical pna.   Past Medical History  Diagnosis Date  . COPD (chronic obstructive pulmonary disease) ([place])   . Asthma   . Myocardial infarction [facility])     Diet Order:  Diet Heart Room service appropriate?: Yes; Fluid consistency:: Thin    Current Nutrition: Upon chart review pt eating >75% of meals on average including 100% of meals today.   Scheduled Medications:  . atorvastatin  10 mg Oral QHS  . clopidogrel  75 mg Oral Daily  . docusate sodium  100 mg Oral BID  . enoxaparin (LOVENOX) injection  40 mg Subcutaneous Q24H  . FLUoxetine  20 mg Oral Daily  . hydrochlorothiazide  25 mg Oral Daily  . HYDROcodone-acetaminophen  1 tablet Oral Q12H  . ipratropium-albuterol  3 mL Nebulization Q6H  . [START ON 09/01/2015] levofloxacin  750 mg Oral Q48H  . losartan  50 mg Oral Daily  . mometasone-formoterol  2 puff Inhalation BID  . pantoprazole  40 mg Oral Daily  . predniSONE  50 mg Oral Q breakfast  . sodium chloride  3 mL Intravenous Q12H  . traZODone  100 mg Oral QHS     Electrolyte/Renal Profile and Glucose Profile:   Recent Labs Lab 08/29/15 0434 08/30/15 0741 08/31/15 0556  NA 139 138 138  K 4.3 3.5 3.1*  CL 103 95* 94*  CO2 30 37* 38*  BUN '19 18 19  '$ CREATININE 1.02* 1.00 1.17*  CALCIUM 8.2* 9.0 8.6*  GLUCOSE 137* 94 113*   Protein Profile:  Recent Labs Lab 08/27/15  ALBUMIN 3.4*    Gastrointestinal Profile: Last BM:   08/31/2015   Weight Change: Per [facility] weight encounters pt weight relatively stable   Height:   Ht Readings from Last 1 Encounters:  08/26/15 '5\' 1"'$  (1.549 m)    Weight:   Wt Readings from Last 1 Encounters:  08/31/15 176 lb 4.8 oz (79.969 kg)     BMI:  Body mass index is 33.33 kg/(m^2).   EDUCATION NEEDS:   No education needs identified at this time   [place], RD, LDN Pager [phone] Weekend/On-Call Pager [phone]

## 2015-08-31 NOTE — Progress Notes (Signed)
Debra [name] NAME: Debra [name]    MR#:  [id number]  DATE OF BIRTH:  01-20-1933  SUBJECTIVE:  CHIEF COMPLAINT:   Chief Complaint  Patient presents with  . Shortness of Breath   States that she is feeling much better. Still on 4 L via nasal cannula. Having panic attacks which complicated her shortness of breath. Wants to go home  REVIEW OF SYSTEMS:   Review of Systems  Constitutional: Positive for chills and malaise/fatigue. Negative for fever.  Respiratory: Positive for cough and shortness of breath.   Cardiovascular: Negative for chest pain and palpitations.  Gastrointestinal: Negative for nausea, vomiting and abdominal pain.  Genitourinary: Negative for dysuria.    DRUG ALLERGIES:   Allergies  Allergen Reactions  . Codeine Itching and Rash    VITALS:  Blood pressure 180/82, pulse 88, temperature 97.5 F (36.4 C), temperature source Oral, resp. rate 17, height '5\' 1"'$  (1.549 m), weight 79.969 kg (176 lb 4.8 oz), SpO2 92 %.  PHYSICAL EXAMINATION:  GENERAL:  81 y.o.-year-old patient lying in the bed with no acute distress. Pale LUNGS: Upper airway wheezes, fair air movement, no rhonchi or rales  CARDIOVASCULAR: S1, S2 normal. No murmurs, rubs, or gallops.  ABDOMEN: Soft, nontender, nondistended. Bowel sounds present. No organomegaly or mass.  EXTREMITIES: No pedal edema, cyanosis, or clubbing.  NEUROLOGIC: Cranial nerves II through XII are intact. Muscle strength 5/5 in all extremities. Sensation intact. Gait not checked.  PSYCHIATRIC: The patient is alert and oriented x 3.  SKIN: No obvious rash, lesion, or ulcer.    LABORATORY PANEL:   CBC  Recent Labs Lab 08/31/15 0556  WBC 9.3  HGB 10.6*  HCT 31.8*  PLT 193   ------------------------------------------------------------------------------------------------------------------  Chemistries   Recent Labs Lab 08/27/15  08/31/15 0556  NA  --   < >  138  K  --   < > 3.1*  CL  --   < > 94*  CO2  --   < > 38*  GLUCOSE  --   < > 113*  BUN  --   < > 19  CREATININE  --   < > 1.17*  CALCIUM  --   < > 8.6*  AST 18  --   --   ALT 11*  --   --   ALKPHOS 50  --   --   BILITOT 2.1*  --   --   < > = values in this interval not displayed. ------------------------------------------------------------------------------------------------------------------  Cardiac Enzymes  Recent Labs Lab 08/27/15  TROPONINI 0.05*   ------------------------------------------------------------------------------------------------------------------  RADIOLOGY:  Dg Chest 2 View  08/30/2015  CLINICAL DATA:  81 year old female admitted 3 days ago for shortness of breath, urinary tract infection. Current history of COPD, hypoxia. Initial encounter. EXAM: CHEST  2 VIEW COMPARISON:  08/28/2015 and earlier. FINDINGS: Acute on chronic reticulonodular opacity in the right mid and lower lung. Underlying coarse chronic pulmonary interstitial markings. Stable lung volumes. No pneumothorax or pleural effusion. No consolidation. Stable cardiac size and mediastinal contours. Sequelae of CABG. IMPRESSION: 1. Acute viral/atypical infection in the right lung superimposed on chronic lung disease. 2. No pleural effusion. Electronically Signed   By: Genevie Ann M.D.   On: 08/30/2015 08:03    EKG:   Orders placed or performed during the hospital encounter of 08/26/15  . EKG 12-Lead  . EKG 12-Lead  . ED EKG  . ED EKG  . EKG  ASSESSMENT AND PLAN:   1) sepsis due to E. Coli bacteremia and uti - No pyelonephritis on CT abdomen - pan sensitive, meropenem and vancomycin DC'd on 12/19 started on oral levaquin - Repeat blood cultures drawn and are negative to date  2) COPD - Still Short of breath wheezing and coughing today, mild COPD exacerbation -Continue supplemental oxygen as needed - Continue prednisone, DuoNeb every 6 hours, start [place]  3) CAD - No chest pain, continue  Plavix, aspirin  4) hypertension: Controlled - Continue losartan, have added hydrochlorothiazide  5) hyperlipidemia: Continue statin  6) tobacco abuse: Continue nicotine patch  7. Atypical pneumonia: Chest x-ray showing atypical pneumonia versus viral process. Levaquin should cover. Will check urine Legionella antigen. Also consider possible edema, will give additional dose of Lasix today  All the records are reviewed and case discussed with Care Management/Social Workerr. Management plans discussed with the patient, family and they are in agreement. Spoke with patient and her son at the bedside.  CODE STATUS: Full   TOTAL TIME TAKING CARE OF THIS PATIENT: 30 minutes.  Greater than 50% of time spent in care coordination and counseling. POSSIBLE D/C IN 2-3 DAYS, DEPENDING ON CLINICAL CONDITION.   Myrtis Ser M.D on 08/31/2015 at 2:50 PM  Between 7am to 6pm - Pager - [phone]  After 6pm go to [email or web address] - password [email or web address] [facility]  Office  [phone]  CC: Primary care physician; Volanda Napoleon, MD

## 2015-09-01 ENCOUNTER — Inpatient Hospital Stay: Payer: Medicare Other

## 2015-09-01 DIAGNOSIS — J441 Chronic obstructive pulmonary disease with (acute) exacerbation: Secondary | ICD-10-CM

## 2015-09-01 LAB — CULTURE, BLOOD (ROUTINE X 2)

## 2015-09-01 LAB — BASIC METABOLIC PANEL
Anion gap: 7 (ref 5–15)
BUN: 27 mg/dL — AB (ref 6–20)
CHLORIDE: 92 mmol/L — AB (ref 101–111)
CO2: 39 mmol/L — ABNORMAL HIGH (ref 22–32)
Calcium: 8.7 mg/dL — ABNORMAL LOW (ref 8.9–10.3)
Creatinine, Ser: 1.16 mg/dL — ABNORMAL HIGH (ref 0.44–1.00)
GFR, EST AFRICAN AMERICAN: 50 mL/min — AB (ref >60)
GFR, EST NON AFRICAN AMERICAN: 43 mL/min — AB (ref >60)
Glucose, Bld: 108 mg/dL — ABNORMAL HIGH (ref 65–99)
POTASSIUM: 4.4 mmol/L (ref 3.5–5.1)
SODIUM: 138 mmol/L (ref 135–145)

## 2015-09-01 LAB — CBC
HCT: 31.9 % — ABNORMAL LOW (ref 35.0–47.0)
HEMOGLOBIN: 10.4 g/dL — AB (ref 12.0–16.0)
MCH: 33.6 pg (ref 26.0–34.0)
MCHC: 32.7 g/dL (ref 32.0–36.0)
MCV: 102.7 fL — ABNORMAL HIGH (ref 80.0–100.0)
PLATELETS: 229 10*3/uL (ref 150–440)
RBC: 3.11 MIL/uL — AB (ref 3.80–5.20)
RDW: 15.7 % — ABNORMAL HIGH (ref 11.5–14.5)
WBC: 10.9 10*3/uL (ref 3.6–11.0)

## 2015-09-01 MED ORDER — LEVOFLOXACIN 750 MG PO TABS: 750.0000 mg | ORAL_TABLET | ORAL | Status: AC

## 2015-09-01 MED ORDER — PREDNISONE 20 MG PO TABS
20.0000 mg | ORAL_TABLET | Freq: Every day | ORAL | Status: DC
  Administered 2015-09-01: 20 mg via ORAL
  Filled 2015-09-01: qty 1

## 2015-09-01 MED ORDER — HYDROCHLOROTHIAZIDE 25 MG PO TABS: 25.0000 mg | ORAL_TABLET | Freq: Every day | ORAL | Status: DC

## 2015-09-01 MED ORDER — PREDNISONE 20 MG PO TABS: 20.0000 mg | ORAL_TABLET | Freq: Every day | ORAL | Status: DC

## 2015-09-01 MED ORDER — LOSARTAN POTASSIUM 50 MG PO TABS: 50.0000 mg | ORAL_TABLET | Freq: Every day | ORAL | Status: DC

## 2015-09-01 MED ORDER — LOSARTAN POTASSIUM 50 MG PO TABS: 75.0000 mg | ORAL_TABLET | Freq: Every day | ORAL | Status: DC

## 2015-09-01 MED ORDER — MOMETASONE FURO-FORMOTEROL FUM 100-5 MCG/ACT IN AERO: 2.0000 | INHALATION_SPRAY | Freq: Two times a day (BID) | RESPIRATORY_TRACT | Status: DC

## 2015-09-01 MED ORDER — FLUOXETINE HCL 20 MG PO CAPS: 20.0000 mg | ORAL_CAPSULE | Freq: Every day | ORAL | Status: AC

## 2015-09-01 MED ORDER — LEVOFLOXACIN 750 MG PO TABS: 750.0000 mg | ORAL_TABLET | ORAL | Status: DC

## 2015-09-01 MED ORDER — LOSARTAN POTASSIUM 25 MG PO TABS
25.0000 mg | ORAL_TABLET | Freq: Once | ORAL | Status: AC
  Administered 2015-09-01: 25 mg via ORAL
  Filled 2015-09-01: qty 1

## 2015-09-01 MED ORDER — TIOTROPIUM BROMIDE MONOHYDRATE 18 MCG IN CAPS: 18.0000 ug | ORAL_CAPSULE | Freq: Every morning | RESPIRATORY_TRACT | Status: AC

## 2015-09-01 NOTE — Care Management Important Message (Signed)
Important Message  Patient Details  Name: Debra [name] MRN: [id number] Date of Birth: 09/06/1933   Medicare Important Message Given:  Yes    Juliann Pulse A Christyna Letendre 09/01/2015, 10:06 AM

## 2015-09-01 NOTE — Progress Notes (Signed)
Physical Therapy Treatment Patient Details Name: Debra [name] MRN: [id number] DOB: March 15, 1933 Today's Date: 09/01/2015    History of Present Illness Pt is a 81yo white female with a PMH: CPOD, emphysema, CAD, HTN, HLD, s/p sternotomy, who is on 2L O2 at home for ~6 months. Pt's youngest son is at bedside assisting with history as needed. Pt reports 3 LOC in november resultant in falls in home that remain insidious at this time. Pt has chronic back pain for which she takes PRN meds, and very limited mobility status for the past 8 months, walking exclusively in the home with rollator, often desaturating to 88% with short household distances. Pt came to [facility] after worsening SOB x1 day, found also to have tachypnea and tachycardia, admitted for sepsis, and found to have active pyelonephritis.      PT Comments    Pt continues to have difficulty with SOB and decreasing O2 saturation with stand activities/short ambulation although pt tolerating on 2 liters O2 today versus 4 liters last session. O2 saturation decreases to 89% with stand activities and ambulation with overt shortness of breath/mild distress and need for seated rest. Pt also experiences significant back pain with stand/ambulation, which limits patients tolerance. With seated rest of a couple minutes pt recovers O2 saturation to 93-95%. Continue PT progressing stand and ambulation activities with safe O2 saturation levels; pt's baseline is home 2 liters O2 with tolerance of only short ambulation distances.   Follow Up Recommendations  SNF     Equipment Recommendations  None recommended by PT    Recommendations for Other Services       Precautions / Restrictions Restrictions Weight Bearing Restrictions: No    Mobility  Bed Mobility Overal bed mobility: Modified Independent Bed Mobility: Supine to Sit     Supine to sit: Modified independent (Device/Increase time)     General bed mobility comments: Use of rails; O2 sats  decrease to 89% sitting edge of bed; pursed lip breathing increases back to 92%  Transfers Overall transfer level: Needs assistance Equipment used: Rolling walker (2 wheeled) Transfers: Sit to/from Stand Sit to Stand: Min guard (cues for safe hand placement)         General transfer comment: cues for safe hand placement; short stand tolerance due to increase in back pain; O2 saturation decreases to 89%  Ambulation/Gait Ambulation/Gait assistance: Min guard Ambulation Distance (Feet): 5 Feet Assistive device: Rolling walker (2 wheeled) Gait Pattern/deviations: Step-to pattern     General Gait Details: SOB noted O2 sats 89% on 2L   Stairs            Wheelchair Mobility    Modified Rankin (Stroke Patients Only)       Balance                                    Cognition Arousal/Alertness: Awake/alert Behavior During Therapy: WFL for tasks assessed/performed Overall Cognitive Status: Within Functional Limits for tasks assessed                      Exercises Other Exercises Other Exercises: in place stand march monitoring O2 saturation and back pain. O2 saturation decreases to 89% with SOB with mild distress. couple minutes to recouperate.    General Comments        Pertinent Vitals/Pain Pain Assessment: 0-10 Pain Score: 9  Pain Location: low back with stand; 0 at rest  Pain Descriptors / Indicators: Sharp Pain Intervention(s): Limited activity within patient's tolerance;Monitored during session;Premedicated before session    Home Living                      Prior Function            PT Goals (current goals can now be found in the care plan section) Progress towards PT goals: Progressing toward goals    Frequency  Min 2X/week    PT Plan Current plan remains appropriate    Co-evaluation             End of Session Equipment Utilized During Treatment: Gait belt Activity Tolerance: Patient limited by  fatigue;Patient limited by pain (decreased O2 saturation) Patient left: in chair;with call bell/phone within reach;with chair alarm set     Time: 1444-5848 PT Time Calculation (min) (ACUTE ONLY): 29 min  Charges:  $Gait Training: 8-22 mins $Therapeutic Activity: 8-22 mins                    G Codes:      Charlaine Dalton 09/01/2015, 11:53 AM

## 2015-09-01 NOTE — Discharge Summary (Signed)
[facility] [place]   PATIENT NAME: Debra [name]    MR#:  [id number]  DATE OF BIRTH:  1933-02-23  DATE OF ADMISSION:  08/26/2015 ADMITTING PHYSICIAN: Harrie Foreman, MD  DATE OF DISCHARGE: 09/01/2015  PRIMARY CARE PHYSICIAN: Volanda Napoleon, MD    ADMISSION DIAGNOSIS:  back pain  DISCHARGE DIAGNOSIS:  Active Problems:   Sepsis ([place])   COPD exacerbation ([place])   SECONDARY DIAGNOSIS:   Past Medical History  Diagnosis Date  . COPD (chronic obstructive pulmonary disease) ([place])   . Asthma   . Myocardial infarction [facility])     HOSPITAL COURSE:    1) sepsis due to E. Coli bacteremia and uti: Resented with back pain. Found to have urinary tract infection there was no pyelonephritis on CT abdomen. Blood and urine cultures grew pansensitive Escherichia coli. She was initially started meropenem and vancomycin, this was changed to Levaquin and she will be discharged on the same to complete a ten-day course.  2) COPD with exacerbation: She has chronic respiratory failure on 2 L at home. During the hospital physician she became more short of breath requiring as much as 4 L via nasal cannula. Prior to hospitalization she had not been on any inhalers. She was started on DULERA and Spiriva. She will continue with 3 more days of prednisone taper. She is back to baseline 2 L nasal cannula. She does desaturate with exertion and knows to turn up her oxygen if needed.  3) CAD: No chest pain, continue Plavix, aspirin  4) hypertension: Blood pressures have been elevated during the hospitalization. Have increased her dose of losartan. Have added hydrochlorothiazide.  5) hyperlipidemia: Continue statin  6) tobacco abuse: Smoking cessation counseling provided daily during hospitalization.   7) generalized anxiety disorder: She has been on Xanax as an outpatient. She had several panic attacks during hospitalization. I started  her on an SSRI, fluoxitine for better symptom control.  8) CKD 3: Cr has remained fairly stable. Would repeat BMP with PCP in 1-2 weeks due to new antihypertensives.   DISCHARGE CONDITIONS:   Stable  CONSULTS OBTAINED:    None  DRUG ALLERGIES:   Allergies  Allergen Reactions  . Codeine Itching and Rash    DISCHARGE MEDICATIONS:   Current Discharge Medication List    START taking these medications   Details  FLUoxetine (PROZAC) 20 MG capsule Take 1 capsule (20 mg total) by mouth daily. Qty: 30 capsule, Refills: 3    hydrochlorothiazide (HYDRODIURIL) 25 MG tablet Take 1 tablet (25 mg total) by mouth daily. Qty: 30 tablet, Refills: 1    levofloxacin (LEVAQUIN) 750 MG tablet Take 1 tablet (750 mg total) by mouth every other day. Qty: 5 tablet, Refills: 0    mometasone-formoterol (DULERA) 100-5 MCG/ACT AERO Inhale 2 puffs into the lungs 2 (two) times daily. Qty: 1 Inhaler, Refills: 1    predniSONE (DELTASONE) 20 MG tablet Take 1 tablet (20 mg total) by mouth daily with breakfast. Qty: 3 tablet, Refills: 0    tiotropium (SPIRIVA HANDIHALER) 18 MCG inhalation capsule Place 1 capsule (18 mcg total) into inhaler and inhale every morning. Qty: 30 capsule, Refills: 12      CONTINUE these medications which have CHANGED   Details  losartan (COZAAR) 50 MG tablet Take 1 tablet (50 mg total) by mouth daily. Qty: 30 tablet, Refills: 0      CONTINUE these medications which have NOT CHANGED   Details  ALPRAZolam (  XANAX) 0.5 MG tablet Take 1 tablet by mouth 2 (two) times daily as needed.    atorvastatin (LIPITOR) 10 MG tablet Take 1 tablet by mouth at bedtime.     clopidogrel (PLAVIX) 75 MG tablet Take 1 tablet by mouth daily.    HYDROcodone-acetaminophen (NORCO/VICODIN) 5-325 MG tablet Take 1 tablet by mouth daily as needed for moderate pain or severe pain.     lidocaine (XYLOCAINE) 5 % ointment Apply 1-2 g topically 4 (four) times daily as needed for mild pain or moderate  pain. apply only to a single body part per application (feet OR hands) Refills: 5    pantoprazole (PROTONIX) 40 MG tablet Take 1 tablet by mouth daily.    PROAIR HFA 108 (90 BASE) MCG/ACT inhaler Inhale 2 puffs into the lungs every 4 (four) hours as needed for wheezing or shortness of breath.     tiZANidine (ZANAFLEX) 2 MG tablet Take 1 tablet by mouth 3 (three) times daily as needed.    traZODone (DESYREL) 50 MG tablet Take 2 tablets by mouth at bedtime.         DISCHARGE INSTRUCTIONS:    Stable condition. Home health PT and RN and 02. Heart health diet.  If you experience worsening of your admission symptoms, develop shortness of breath, life threatening emergency, suicidal or homicidal thoughts you must seek medical attention immediately by calling 911 or calling your MD immediately  if symptoms less severe.  You Must read complete instructions/literature along with all the possible adverse reactions/side effects for all the Medicines you take and that have been prescribed to you. Take any new Medicines after you have completely understood and accept all the possible adverse reactions/side effects.   Please note  You were cared for by a hospitalist during your hospital stay. If you have any questions about your discharge medications or the care you received while you were in the hospital after you are discharged, you can call the unit and asked to speak with the hospitalist on call if the hospitalist that took care of you is not available. Once you are discharged, your primary care physician will handle any further medical issues. Please note that NO REFILLS for any discharge medications will be authorized once you are discharged, as it is imperative that you return to your primary care physician (or establish a relationship with a primary care physician if you do not have one) for your aftercare needs so that they can reassess your need for medications and monitor your lab  values.    Today   CHIEF COMPLAINT:   Chief Complaint  Patient presents with  . Shortness of Breath    HISTORY OF PRESENT ILLNESS:   The patient presents emergency department initially complaining of shortness of breath. She admits to a nonproductive cough that seems to be mildly worse as the weather has turned cold. Further questioning reveals the patient has been suffering from general malaise as well as urinary urgency. She admits to frequency of urination and complains of dysuria as of this morning. She denies fevers, nausea or vomiting. Her shortness of breath and cough has never been associated with chest pain during this illness. In the emergency department the patient's ABG showed some derangement of normal values but she was in no respiratory distress. However, urinalysis revealed urinary tract infection with clinical presentation consistent with pyelonephritis which prompted the emergency department staff to call for admission.  VITAL SIGNS:  Blood pressure 168/81, pulse 80, temperature 98.2 F (36.8 C),  temperature source Oral, resp. rate 22, height '5\' 1"'$  (1.549 m), weight 81.012 kg (178 lb 9.6 oz), SpO2 92 %.  I/O:   Intake/Output Summary (Last 24 hours) at 09/01/15 1307 Last data filed at 09/01/15 1000  Gross per 24 hour  Intake    480 ml  Output    125 ml  Net    355 ml    PHYSICAL EXAMINATION:  GENERAL: 81 y.o.-year-old patient lying in the bed with no acute distress. Pale LUNGS: Upper airway wheezes, good air movement, no rhonchi or rales  CARDIOVASCULAR: S1, S2 normal. No murmurs, rubs, or gallops.  ABDOMEN: Soft, nontender, nondistended. Bowel sounds present. No organomegaly or mass.  EXTREMITIES: No pedal edema, cyanosis, or clubbing.  NEUROLOGIC: Cranial nerves II through XII are intact. Muscle strength 5/5 in all extremities. Sensation intact. Gait not checked.  PSYCHIATRIC: The patient is alert and oriented x 3.  SKIN: No obvious rash, lesion, or  ulcer.   DATA REVIEW:   CBC  Recent Labs Lab 09/01/15 0549  WBC 10.9  HGB 10.4*  HCT 31.9*  PLT 229    Chemistries   Recent Labs Lab 08/27/15  09/01/15 0549  NA  --   < > 138  K  --   < > 4.4  CL  --   < > 92*  CO2  --   < > 39*  GLUCOSE  --   < > 108*  BUN  --   < > 27*  CREATININE  --   < > 1.16*  CALCIUM  --   < > 8.7*  AST 18  --   --   ALT 11*  --   --   ALKPHOS 50  --   --   BILITOT 2.1*  --   --   < > = values in this interval not displayed.  Cardiac Enzymes  Recent Labs Lab 08/27/15  TROPONINI 0.05*    Microbiology Results  Results for orders placed or performed during the hospital encounter of 08/26/15  Urine culture     Status: None   Collection Time: 08/27/15  3:46 AM  Result Value Ref Range Status   Specimen Description URINE, RANDOM  Final   Special Requests NONE  Final   Culture >=100,000 COLONIES/mL ESCHERICHIA COLI  Final   Report Status 08/29/2015 FINAL  Final   Organism ID, Bacteria ESCHERICHIA COLI  Final      Susceptibility   Escherichia coli - MIC*    AMPICILLIN >=32 RESISTANT Resistant     CEFTAZIDIME <=1 SENSITIVE Sensitive     CEFAZOLIN <=4 SENSITIVE Sensitive     CEFTRIAXONE <=1 SENSITIVE Sensitive     CIPROFLOXACIN <=0.25 SENSITIVE Sensitive     GENTAMICIN <=1 SENSITIVE Sensitive     IMIPENEM <=0.25 SENSITIVE Sensitive     TRIMETH/SULFA <=20 SENSITIVE Sensitive     PIP/TAZO Value in next row Sensitive      SENSITIVE<=4    * >=100,000 COLONIES/mL ESCHERICHIA COLI  Blood culture (routine x 2)     Status: None   Collection Time: 08/27/15  4:48 AM  Result Value Ref Range Status   Specimen Description BLOOD LEFT FACE  Final   Special Requests BOTTLES DRAWN AEROBIC AND ANAEROBIC 4CC  Final   Culture  Setup Time   Final    GRAM NEGATIVE RODS IN BOTH AEROBIC AND ANAEROBIC BOTTLES CRITICAL VALUE NOTED.  VALUE IS CONSISTENT WITH PREVIOUSLY REPORTED AND CALLED VALUE.    Culture ESCHERICHIA COLI  Final   Report Status 09/01/2015  FINAL  Final   Organism ID, Bacteria ESCHERICHIA COLI  Final      Susceptibility   Escherichia coli - MIC*    AMPICILLIN >=32 RESISTANT Resistant     CEFTAZIDIME <=1 SENSITIVE Sensitive     CEFAZOLIN <=4 SENSITIVE Sensitive     CEFTRIAXONE <=1 SENSITIVE Sensitive     CIPROFLOXACIN <=0.25 SENSITIVE Sensitive     GENTAMICIN <=1 SENSITIVE Sensitive     IMIPENEM <=0.25 SENSITIVE Sensitive     TRIMETH/SULFA <=20 SENSITIVE Sensitive     PIP/TAZO Value in next row Sensitive      SENSITIVE<=4    * ESCHERICHIA COLI  Blood culture (routine x 2)     Status: None   Collection Time: 08/27/15  4:49 AM  Result Value Ref Range Status   Specimen Description BLOOD RIGHT ASSIST CONTROL  Final   Special Requests BOTTLES DRAWN AEROBIC AND ANAEROBIC 4CC  Final   Culture  Setup Time   Final    GRAM NEGATIVE RODS AEROBIC BOTTLE ONLY CRITICAL RESULT CALLED TO, READ BACK BY AND VERIFIED WITH: MATT MCBANE AT [place] ON 08/28/15 [facility] CONFIRMED BY [facility]    Culture   Final    ESCHERICHIA COLI IN BOTH AEROBIC AND ANAEROBIC BOTTLES    Report Status 09/01/2015 FINAL  Final   Organism ID, Bacteria ESCHERICHIA COLI  Final      Susceptibility   Escherichia coli - MIC*    AMPICILLIN >=32 RESISTANT Resistant     CEFTAZIDIME <=1 SENSITIVE Sensitive     CEFAZOLIN <=4 SENSITIVE Sensitive     CEFTRIAXONE <=1 SENSITIVE Sensitive     CIPROFLOXACIN <=0.25 SENSITIVE Sensitive     GENTAMICIN <=1 SENSITIVE Sensitive     IMIPENEM <=0.25 SENSITIVE Sensitive     TRIMETH/SULFA <=20 SENSITIVE Sensitive     PIP/TAZO Value in next row Sensitive      SENSITIVE<=4    * ESCHERICHIA COLI  Blood Culture ID Panel (Reflexed)     Status: Abnormal   Collection Time: 08/27/15  4:49 AM  Result Value Ref Range Status   Enterococcus species NOT DETECTED NOT DETECTED Final   Listeria monocytogenes NOT DETECTED NOT DETECTED Final   Staphylococcus species NOT DETECTED NOT DETECTED Final   Staphylococcus aureus NOT DETECTED NOT DETECTED Final    Streptococcus species NOT DETECTED NOT DETECTED Final   Streptococcus agalactiae NOT DETECTED NOT DETECTED Final   Streptococcus pneumoniae NOT DETECTED NOT DETECTED Final   Streptococcus pyogenes NOT DETECTED NOT DETECTED Final   Acinetobacter baumannii NOT DETECTED NOT DETECTED Final   Enterobacteriaceae species NOT DETECTED NOT DETECTED Final   Enterobacter cloacae complex NOT DETECTED NOT DETECTED Final   Escherichia coli DETECTED (A) NOT DETECTED Final    Comment: CALLED CRITICAL RESULT TO MATT MCBANE AT 0205 ON 08/28/15 [facility] READ BACK   Klebsiella oxytoca NOT DETECTED NOT DETECTED Final   Klebsiella pneumoniae NOT DETECTED NOT DETECTED Final   Proteus species NOT DETECTED NOT DETECTED Final   Serratia marcescens NOT DETECTED NOT DETECTED Final   Haemophilus influenzae NOT DETECTED NOT DETECTED Final   Neisseria meningitidis NOT DETECTED NOT DETECTED Final   Pseudomonas aeruginosa NOT DETECTED NOT DETECTED Final   Candida albicans NOT DETECTED NOT DETECTED Final   Candida glabrata NOT DETECTED NOT DETECTED Final   Candida krusei NOT DETECTED NOT DETECTED Final   Candida parapsilosis NOT DETECTED NOT DETECTED Final   Candida tropicalis NOT DETECTED NOT DETECTED Final   Carbapenem resistance  NOT DETECTED NOT DETECTED Final   Methicillin resistance NOT DETECTED NOT DETECTED Final   Vancomycin resistance NOT DETECTED NOT DETECTED Final  Culture, blood (Routine X 2) w Reflex to ID Panel     Status: None (Preliminary result)   Collection Time: 08/29/15  8:42 AM  Result Value Ref Range Status   Specimen Description BLOOD RIGHT HAND  Final   Special Requests   Final    BOTTLES DRAWN AEROBIC AND ANAEROBIC  1CC ANEAERO 3CC AERO   Culture NO GROWTH 2 DAYS  Final   Report Status PENDING  Incomplete  Culture, blood (Routine X 2) w Reflex to ID Panel     Status: None (Preliminary result)   Collection Time: 08/29/15  8:43 AM  Result Value Ref Range Status   Specimen Description BLOOD LEFT  HAND  Final   Special Requests   Final    BOTTLES DRAWN AEROBIC AND ANAEROBIC 5CC ANAERO 3CC AERO   Culture NO GROWTH 2 DAYS  Final   Report Status PENDING  Incomplete  Culture, expectorated sputum-assessment     Status: None   Collection Time: 08/31/15 11:33 AM  Result Value Ref Range Status   Specimen Description SPUTUM  Final   Special Requests SPU  Final   Sputum evaluation   Final    Sputum specimen not acceptable for testing.  Please recollect.   Results Called to: Mali Vandyke @ 7510 08/31/15 [place]   Report Status 08/31/2015 FINAL  Final    RADIOLOGY:  Dg Chest 2 View  09/01/2015  CLINICAL DATA:  Admitted for shortness of breath and COPD exacerbation with improving respiratory status. Atypical pneumonia per ordering physician. EXAM: CHEST  2 VIEW COMPARISON:  08/30/2015 as well as 08/26/2015 and 09/13/2014 FINDINGS: Sternotomy wires unchanged. Lungs are adequately inflated demonstrate persistent diffuse bilateral increased interstitial markings with patchy focal interstitial change in the right mid to lower lung. These findings are unchanged from 08/30/2015 bowel worse compared to the older exams. No focal lobar consolidation. No definite effusion. Mild flattening of the hemidiaphragms on the lateral film. Mild stable cardiomegaly. Calcification over the thoracic aorta. Multilevel previous kyphoplasties. IMPRESSION: Persistent acute bilateral increased interstitial markings with patchy interstitial change over the right mid to lower lung which may reflect an atypical infectious or inflammatory process. Stable cardiomegaly. Electronically Signed   By: Marin Olp M.D.   On: 09/01/2015 09:51    EKG:   Orders placed or performed during the hospital encounter of 08/26/15  . EKG 12-Lead  . EKG 12-Lead  . ED EKG  . ED EKG  . EKG      Management plans discussed with the patient, family and they are in agreement.  CODE STATUS:     Code Status Orders        Start     Ordered    08/27/15 0806  Full code   Continuous     08/27/15 0805      TOTAL TIME TAKING CARE OF THIS PATIENT: 40 minutes.  Greater than 50% of time spent in care coordination and counseling.  Myrtis Ser M.D on 09/01/2015 at 1:07 PM  Between 7am to 6pm - Pager - [phone]  After 6pm go to [email or web address] - password [email or web address] [facility]  Office  [phone]  CC: Primary care physician; Volanda Napoleon, MD

## 2015-09-01 NOTE — Care Management (Addendum)
Patient did better with her O2 today and patient is basically at her baseline. She can return home with home health. She thinks she used [place] in the past. I have sent message to Octavia Bruckner with [facility] to see if they can take her. She lives with her 79 year old daughter. She has a rollator and walker available at home. CSW updated. RNCM will continue to follow. She has chronic O2 through [place].  [facility] can see patient on Wednesday. Patient is aware and agrees. Said her daughter will provide transportation to home today and portable O2 tanks. Case closed.

## 2015-09-01 NOTE — Discharge Instructions (Signed)
You should not smoke or be exposed to smoke to protect your lungs from further damage.   DIET:  Cardiac diet and Low fat, Low cholesterol diet  DISCHARGE CONDITION:  Fair  ACTIVITY:  Activity as tolerated  OXYGEN:  Home Oxygen: Yes.     Oxygen Delivery: 2 liters/min via Patient connected to nasal cannula oxygen  DISCHARGE LOCATION:  home   If you experience worsening of your admission symptoms, develop shortness of breath, life threatening emergency, suicidal or homicidal thoughts you must seek medical attention immediately by calling 911 or calling your MD immediately  if symptoms less severe.  You Must read complete instructions/literature along with all the possible adverse reactions/side effects for all the Medicines you take and that have been prescribed to you. Take any new Medicines after you have completely understood and accpet all the possible adverse reactions/side effects.   Please note  You were cared for by a hospitalist during your hospital stay. If you have any questions about your discharge medications or the care you received while you were in the hospital after you are discharged, you can call the unit and asked to speak with the hospitalist on call if the hospitalist that took care of you is not available. Once you are discharged, your primary care physician will handle any further medical issues. Please note that NO REFILLS for any discharge medications will be authorized once you are discharged, as it is imperative that you return to your primary care physician (or establish a relationship with a primary care physician if you do not have one) for your aftercare needs so that they can reassess your need for medications and monitor your lab values.

## 2015-09-01 NOTE — Clinical Social Work Note (Signed)
Clinical Social Worker consulted for [employer]. PT recommended SNF, however pt declined and opted to return home with home health services. RNCM following for discharge planning needs. CSW is signing off as no further needs identified.   Darden Dates, MSW, LCSW Clinical Social Worker  [phone]

## 2015-09-03 LAB — CULTURE, BLOOD (ROUTINE X 2)
Culture: NO GROWTH
Culture: NO GROWTH

## 2015-09-14 ENCOUNTER — Emergency Department: Payer: Medicare Other

## 2015-09-14 ENCOUNTER — Observation Stay
Admission: EM | Admit: 2015-09-14 | Discharge: 2015-09-16 | Disposition: A | Discharge status: Home / Self Care | Payer: Medicare Other | Attending: Internal Medicine | Admitting: Internal Medicine

## 2015-09-14 ENCOUNTER — Encounter (INDEPENDENT_AMBULATORY_CARE_PROVIDER_SITE_OTHER): Payer: Self-pay

## 2015-09-14 DIAGNOSIS — I2510 Atherosclerotic heart disease of native coronary artery without angina pectoris: Secondary | ICD-10-CM | POA: Insufficient documentation

## 2015-09-14 DIAGNOSIS — E872 Acidosis, unspecified: Secondary | ICD-10-CM

## 2015-09-14 DIAGNOSIS — R300 Dysuria: Secondary | ICD-10-CM | POA: Diagnosis not present

## 2015-09-14 DIAGNOSIS — F17200 Nicotine dependence, unspecified, uncomplicated: Secondary | ICD-10-CM | POA: Diagnosis not present

## 2015-09-14 DIAGNOSIS — N183 Chronic kidney disease, stage 3 (moderate): Secondary | ICD-10-CM | POA: Insufficient documentation

## 2015-09-14 DIAGNOSIS — N179 Acute kidney failure, unspecified: Secondary | ICD-10-CM | POA: Diagnosis not present

## 2015-09-14 DIAGNOSIS — Z9049 Acquired absence of other specified parts of digestive tract: Secondary | ICD-10-CM | POA: Diagnosis not present

## 2015-09-14 DIAGNOSIS — I252 Old myocardial infarction: Secondary | ICD-10-CM | POA: Diagnosis not present

## 2015-09-14 DIAGNOSIS — Z8249 Family history of ischemic heart disease and other diseases of the circulatory system: Secondary | ICD-10-CM | POA: Insufficient documentation

## 2015-09-14 DIAGNOSIS — R42 Dizziness and giddiness: Secondary | ICD-10-CM | POA: Diagnosis not present

## 2015-09-14 DIAGNOSIS — Z951 Presence of aortocoronary bypass graft: Secondary | ICD-10-CM | POA: Diagnosis not present

## 2015-09-14 DIAGNOSIS — I959 Hypotension, unspecified: Secondary | ICD-10-CM | POA: Diagnosis not present

## 2015-09-14 DIAGNOSIS — J209 Acute bronchitis, unspecified: Secondary | ICD-10-CM | POA: Insufficient documentation

## 2015-09-14 DIAGNOSIS — J45909 Unspecified asthma, uncomplicated: Secondary | ICD-10-CM | POA: Insufficient documentation

## 2015-09-14 DIAGNOSIS — E876 Hypokalemia: Secondary | ICD-10-CM

## 2015-09-14 DIAGNOSIS — Z9889 Other specified postprocedural states: Secondary | ICD-10-CM | POA: Insufficient documentation

## 2015-09-14 DIAGNOSIS — Z9981 Dependence on supplemental oxygen: Secondary | ICD-10-CM | POA: Insufficient documentation

## 2015-09-14 DIAGNOSIS — E860 Dehydration: Secondary | ICD-10-CM | POA: Insufficient documentation

## 2015-09-14 DIAGNOSIS — J440 Chronic obstructive pulmonary disease with acute lower respiratory infection: Secondary | ICD-10-CM | POA: Diagnosis not present

## 2015-09-14 DIAGNOSIS — Z885 Allergy status to narcotic agent status: Secondary | ICD-10-CM | POA: Insufficient documentation

## 2015-09-14 DIAGNOSIS — R55 Syncope and collapse: Secondary | ICD-10-CM | POA: Diagnosis not present

## 2015-09-14 DIAGNOSIS — Z880 Allergy status to penicillin: Secondary | ICD-10-CM | POA: Diagnosis not present

## 2015-09-14 DIAGNOSIS — Z886 Allergy status to analgesic agent status: Secondary | ICD-10-CM | POA: Insufficient documentation

## 2015-09-14 DIAGNOSIS — N189 Chronic kidney disease, unspecified: Secondary | ICD-10-CM

## 2015-09-14 LAB — CBC WITH DIFFERENTIAL/PLATELET
Basophils Absolute: 0 10*3/uL (ref 0–0.1)
Basophils Relative: 1 %
EOS PCT: 1 %
Eosinophils Absolute: 0.1 10*3/uL (ref 0–0.7)
HCT: 31.9 % — ABNORMAL LOW (ref 35.0–47.0)
Hemoglobin: 10.5 g/dL — ABNORMAL LOW (ref 12.0–16.0)
LYMPHS ABS: 1.6 10*3/uL (ref 1.0–3.6)
LYMPHS PCT: 24 %
MCH: 32.7 pg (ref 26.0–34.0)
MCHC: 32.9 g/dL (ref 32.0–36.0)
MCV: 99.4 fL (ref 80.0–100.0)
MONO ABS: 0.7 10*3/uL (ref 0.2–0.9)
Monocytes Relative: 11 %
Neutro Abs: 4.1 10*3/uL (ref 1.4–6.5)
Neutrophils Relative %: 63 %
PLATELETS: 194 10*3/uL (ref 150–440)
RBC: 3.21 MIL/uL — AB (ref 3.80–5.20)
RDW: 14.5 % (ref 11.5–14.5)
WBC: 6.6 10*3/uL (ref 3.6–11.0)

## 2015-09-14 LAB — URINALYSIS COMPLETE WITH MICROSCOPIC (ARMC ONLY)
BACTERIA UA: NONE SEEN
Bilirubin Urine: NEGATIVE
Glucose, UA: 50 mg/dL — AB
HGB URINE DIPSTICK: NEGATIVE
KETONES UR: NEGATIVE mg/dL
NITRITE: NEGATIVE
PH: 7 (ref 5.0–8.0)
PROTEIN: NEGATIVE mg/dL
SPECIFIC GRAVITY, URINE: 1.008 (ref 1.005–1.030)

## 2015-09-14 LAB — BASIC METABOLIC PANEL
Anion gap: 8 (ref 5–15)
BUN: 25 mg/dL — AB (ref 6–20)
CO2: 38 mmol/L — ABNORMAL HIGH (ref 22–32)
Calcium: 9.4 mg/dL (ref 8.9–10.3)
Chloride: 93 mmol/L — ABNORMAL LOW (ref 101–111)
Creatinine, Ser: 1.92 mg/dL — ABNORMAL HIGH (ref 0.44–1.00)
GFR calc Af Amer: 27 mL/min — ABNORMAL LOW (ref >60)
GFR, EST NON AFRICAN AMERICAN: 24 mL/min — AB (ref >60)
GLUCOSE: 101 mg/dL — AB (ref 65–99)
POTASSIUM: 3.3 mmol/L — AB (ref 3.5–5.1)
Sodium: 139 mmol/L (ref 135–145)

## 2015-09-14 LAB — LACTIC ACID, PLASMA
Lactic Acid, Venous: 2.6 mmol/L (ref 0.5–2.0)
Lactic Acid, Venous: 1.8 mmol/L (ref 0.5–2.0)

## 2015-09-14 LAB — TROPONIN I: Troponin I: <0.03 ng/mL (ref <0.031)

## 2015-09-14 LAB — MAGNESIUM: MAGNESIUM: 1.5 mg/dL — AB (ref 1.7–2.4)

## 2015-09-14 LAB — TSH: TSH: 2.345 u[IU]/mL (ref 0.350–4.500)

## 2015-09-14 MED ORDER — TRAZODONE HCL 50 MG PO TABS
50.0000 mg | ORAL_TABLET | Freq: Every evening | ORAL | Status: DC | PRN
  Administered 2015-09-15: 50 mg via ORAL
  Filled 2015-09-14: qty 2

## 2015-09-14 MED ORDER — FLUOXETINE HCL 20 MG PO CAPS
20.0000 mg | ORAL_CAPSULE | Freq: Every day | ORAL | Status: DC
  Administered 2015-09-14 – 2015-09-16 (×3): 20 mg via ORAL
  Filled 2015-09-14 (×3): qty 1

## 2015-09-14 MED ORDER — CLOPIDOGREL BISULFATE 75 MG PO TABS
75.0000 mg | ORAL_TABLET | Freq: Every day | ORAL | Status: DC
  Administered 2015-09-14 – 2015-09-16 (×3): 75 mg via ORAL
  Filled 2015-09-14 (×3): qty 1

## 2015-09-14 MED ORDER — HYDROCODONE-ACETAMINOPHEN 5-325 MG PO TABS
0.5000 | ORAL_TABLET | Freq: Every day | ORAL | Status: DC | PRN
  Administered 2015-09-15: 0.5 via ORAL
  Administered 2015-09-15: 1 via ORAL
  Filled 2015-09-14 (×2): qty 1

## 2015-09-14 MED ORDER — TIOTROPIUM BROMIDE MONOHYDRATE 18 MCG IN CAPS
18.0000 ug | ORAL_CAPSULE | Freq: Every morning | RESPIRATORY_TRACT | Status: DC
  Administered 2015-09-15 – 2015-09-16 (×2): 18 ug via RESPIRATORY_TRACT

## 2015-09-14 MED ORDER — TIZANIDINE HCL 4 MG PO TABS: 2.0000 mg | ORAL_TABLET | Freq: Three times a day (TID) | ORAL | Status: DC | PRN

## 2015-09-14 MED ORDER — MOMETASONE FURO-FORMOTEROL FUM 100-5 MCG/ACT IN AERO
2.0000 | INHALATION_SPRAY | Freq: Two times a day (BID) | RESPIRATORY_TRACT | Status: DC
  Administered 2015-09-14 – 2015-09-16 (×4): 2 via RESPIRATORY_TRACT
  Filled 2015-09-14: qty 8.8

## 2015-09-14 MED ORDER — SODIUM CHLORIDE 0.9 % IV BOLUS (SEPSIS)
1000.0000 mL | Freq: Once | INTRAVENOUS | Status: AC
  Administered 2015-09-14: 1000 mL via INTRAVENOUS

## 2015-09-14 MED ORDER — ONDANSETRON HCL 4 MG PO TABS: 4.0000 mg | ORAL_TABLET | Freq: Four times a day (QID) | ORAL | Status: DC | PRN

## 2015-09-14 MED ORDER — SODIUM CHLORIDE 0.9 % IV BOLUS (SEPSIS)
1000.0000 mL | Freq: Once | INTRAVENOUS | Status: AC
Start: 2015-09-14 — End: 2015-09-14
  Administered 2015-09-14: 1000 mL via INTRAVENOUS

## 2015-09-14 MED ORDER — POTASSIUM CHLORIDE CRYS ER 20 MEQ PO TBCR
40.0000 meq | EXTENDED_RELEASE_TABLET | Freq: Once | ORAL | Status: AC
  Administered 2015-09-14: 40 meq via ORAL
  Filled 2015-09-14: qty 2

## 2015-09-14 MED ORDER — ALPRAZOLAM 0.5 MG PO TABS
0.5000 mg | ORAL_TABLET | Freq: Two times a day (BID) | ORAL | Status: DC
  Administered 2015-09-14 – 2015-09-16 (×4): 0.5 mg via ORAL
  Filled 2015-09-14 (×4): qty 1

## 2015-09-14 MED ORDER — ONDANSETRON HCL 4 MG/2ML IJ SOLN: 4.0000 mg | Freq: Four times a day (QID) | INTRAMUSCULAR | Status: DC | PRN

## 2015-09-14 MED ORDER — PNEUMOCOCCAL VAC POLYVALENT 25 MCG/0.5ML IJ INJ: 0.5000 mL | INJECTION | INTRAMUSCULAR | Status: DC

## 2015-09-14 MED ORDER — ALBUTEROL SULFATE (2.5 MG/3ML) 0.083% IN NEBU
2.5000 mg | INHALATION_SOLUTION | Freq: Four times a day (QID) | RESPIRATORY_TRACT | Status: DC | PRN
  Filled 2015-09-14: qty 3

## 2015-09-14 MED ORDER — ACETAMINOPHEN 325 MG PO TABS: 650.0000 mg | ORAL_TABLET | Freq: Four times a day (QID) | ORAL | Status: DC | PRN

## 2015-09-14 MED ORDER — PANTOPRAZOLE SODIUM 40 MG PO TBEC
40.0000 mg | DELAYED_RELEASE_TABLET | Freq: Every day | ORAL | Status: DC
  Administered 2015-09-14 – 2015-09-16 (×2): 40 mg via ORAL
  Filled 2015-09-14 (×3): qty 1

## 2015-09-14 MED ORDER — SODIUM CHLORIDE 0.9 % IJ SOLN
3.0000 mL | Freq: Two times a day (BID) | INTRAMUSCULAR | Status: DC
  Administered 2015-09-14 – 2015-09-16 (×3): 3 mL via INTRAVENOUS

## 2015-09-14 MED ORDER — CEFTRIAXONE SODIUM 1 G IJ SOLR
1.0000 g | Freq: Once | INTRAMUSCULAR | Status: AC
  Administered 2015-09-14: 1 g via INTRAVENOUS
  Filled 2015-09-14: qty 10

## 2015-09-14 MED ORDER — HEPARIN SODIUM (PORCINE) 5000 UNIT/ML IJ SOLN
5000.0000 [IU] | Freq: Three times a day (TID) | INTRAMUSCULAR | Status: DC
  Administered 2015-09-14 – 2015-09-16 (×6): 5000 [IU] via SUBCUTANEOUS
  Filled 2015-09-14 (×6): qty 1

## 2015-09-14 MED ORDER — ACETAMINOPHEN 650 MG RE SUPP: 650.0000 mg | Freq: Four times a day (QID) | RECTAL | Status: DC | PRN

## 2015-09-14 MED ORDER — CETYLPYRIDINIUM CHLORIDE 0.05 % MT LIQD
7.0000 mL | Freq: Two times a day (BID) | OROMUCOSAL | Status: DC
  Administered 2015-09-14 – 2015-09-15 (×2): 7 mL via OROMUCOSAL

## 2015-09-14 MED ORDER — POTASSIUM CHLORIDE IN NACL 20-0.9 MEQ/L-% IV SOLN
INTRAVENOUS | Status: DC
  Administered 2015-09-14 – 2015-09-15 (×2): via INTRAVENOUS
  Filled 2015-09-14 (×3): qty 1000

## 2015-09-14 NOTE — ED Provider Notes (Signed)
[facility] Emergency Department Provider Note   ____________________________________________  Time seen: 1600  I have reviewed the triage vital signs and the nursing notes.   HISTORY  Chief Complaint Hypotension and Dizziness   History limited by: Not Limited   HPI Debra [name] is a 82 y.o. female who presents to the emergency department today with primary complaints of dizziness and low blood pressure. She states that the dizziness started a little over 24 hours ago. She states that it has then been persistent. The timing of it is intermittent. The modifying factor of sitting up or standing up makes it worse. The patient denies associated symptom of syncope, chest pain or shortness of breath. She denies any recent nausea or vomiting. States she has had her normal oral intake. The patient does state however that she noticed some burning to her urination today. She does have a years history of urinary tract infections. The patient states that home health nurse today noted that her blood pressure was low.     Past Medical History  Diagnosis Date  . COPD (chronic obstructive pulmonary disease) ([place])   . Asthma   . Myocardial infarction [facility])     Patient Active Problem List   Diagnosis Date Noted  . COPD exacerbation ([place]) 09/01/2015  . Sepsis ([place]) 08/27/2015    Past Surgical History  Procedure Laterality Date  . Abdominal hysterectomy    . Cholecystectomy    . Tonsillectomy    . Breast surgery    . Bypass graft  2007    triple    Current Outpatient Rx  Name  Route  Sig  Dispense  Refill  . ALPRAZolam (XANAX) 0.5 MG tablet   Oral   Take 1 tablet by mouth 2 (two) times daily as needed.         Marland Kitchen atorvastatin (LIPITOR) 10 MG tablet   Oral   Take 1 tablet by mouth at bedtime.          . clopidogrel (PLAVIX) 75 MG tablet   Oral   Take 1 tablet by mouth daily.         Marland Kitchen FLUoxetine (PROZAC) 20 MG capsule   Oral   Take 1 capsule  (20 mg total) by mouth daily.   30 capsule   3   . hydrochlorothiazide (HYDRODIURIL) 25 MG tablet   Oral   Take 1 tablet (25 mg total) by mouth daily.   30 tablet   1   . HYDROcodone-acetaminophen (NORCO/VICODIN) 5-325 MG tablet   Oral   Take 1 tablet by mouth daily as needed for moderate pain or severe pain.          Marland Kitchen lidocaine (XYLOCAINE) 5 % ointment   Topical   Apply 1-2 g topically 4 (four) times daily as needed for mild pain or moderate pain. apply only to a single body part per application (feet OR hands)      5   . losartan (COZAAR) 50 MG tablet   Oral   Take 1 tablet (50 mg total) by mouth daily.   30 tablet   0   . mometasone-formoterol (DULERA) 100-5 MCG/ACT AERO   Inhalation   Inhale 2 puffs into the lungs 2 (two) times daily.   1 Inhaler   1   . pantoprazole (PROTONIX) 40 MG tablet   Oral   Take 1 tablet by mouth daily.         . predniSONE (DELTASONE) 20 MG tablet  Oral   Take 1 tablet (20 mg total) by mouth daily with breakfast.   3 tablet   0   . PROAIR HFA 108 (90 BASE) MCG/ACT inhaler   Inhalation   Inhale 2 puffs into the lungs every 4 (four) hours as needed for wheezing or shortness of breath.            Dispense as written.   . tiotropium (SPIRIVA HANDIHALER) 18 MCG inhalation capsule   Inhalation   Place 1 capsule (18 mcg total) into inhaler and inhale every morning.   30 capsule   12   . tiZANidine (ZANAFLEX) 2 MG tablet   Oral   Take 1 tablet by mouth 3 (three) times daily as needed.         . traZODone (DESYREL) 50 MG tablet   Oral   Take 2 tablets by mouth at bedtime.           Allergies Codeine  Family History  Problem Relation Age of Onset  . CAD Father     Social History Social History  Substance Use Topics  . Smoking status: Current Some Day Smoker  . Smokeless tobacco: None  . Alcohol Use: No    Review of Systems  Constitutional: Negative for fever. Cardiovascular: Negative for chest  pain. Respiratory: Negative for shortness of breath. Gastrointestinal: Negative for abdominal pain, vomiting and diarrhea. Genitourinary: Positive for dysuria. Neurological: Negative for headaches, focal weakness or numbness.   10-point ROS otherwise negative.  ____________________________________________   PHYSICAL EXAM:  VITAL SIGNS: ED Triage Vitals  Enc Vitals Group     BP 09/14/15 1524 77/50 mmHg     Pulse Rate 09/14/15 1524 88     Resp 09/14/15 1524 16     Temp 09/14/15 1524 97.7 F (36.5 C)     Temp Source 09/14/15 1524 Oral     SpO2 09/14/15 1524 98 %     Weight 09/14/15 1524 171 lb (77.565 kg)     Height 09/14/15 1524 '5\' 1"'$  (1.549 m)     Head Cir --      Peak Flow --      Pain Score 09/14/15 1525 0   Constitutional: Alert and oriented. Well appearing and in no distress. Eyes: Conjunctivae are normal. PERRL. Normal extraocular movements. ENT   Head: Normocephalic and atraumatic.   Nose: No congestion/rhinnorhea.   Mouth/Throat: Mucous membranes are moist.   Neck: No stridor. Hematological/Lymphatic/Immunilogical: No cervical lymphadenopathy. Cardiovascular: Normal rate, regular rhythm.  No murmurs, rubs, or gallops. Respiratory: Normal respiratory effort without tachypnea nor retractions. Breath sounds are clear and equal bilaterally. No wheezes/rales/rhonchi. Gastrointestinal: Soft and nontender. No distention.  Genitourinary: Deferred Musculoskeletal: Normal range of motion in all extremities. No joint effusions.  No lower extremity tenderness nor edema. Neurologic:  Normal speech and language. No gross focal neurologic deficits are appreciated.  Skin:  Skin is warm, dry and intact. No rash noted. Psychiatric: Mood and affect are normal. Speech and behavior are normal. Patient exhibits appropriate insight and judgment.  ____________________________________________    LABS (pertinent positives/negatives)  Labs Reviewed  CBC WITH  DIFFERENTIAL/PLATELET - Abnormal; Notable for the following:    RBC 3.21 (*)    Hemoglobin 10.5 (*)    HCT 31.9 (*)    All other components within normal limits  BASIC METABOLIC PANEL - Abnormal; Notable for the following:    Potassium 3.3 (*)    Chloride 93 (*)    CO2 38 (*)    Glucose,  Bld 101 (*)    BUN 25 (*)    Creatinine, Ser 1.92 (*)    GFR calc non Af Amer 24 (*)    GFR calc Af Amer 27 (*)    All other components within normal limits  LACTIC ACID, PLASMA - Abnormal; Notable for the following:    Lactic Acid, Venous 2.6 (*)    All other components within normal limits  URINALYSIS COMPLETEWITH MICROSCOPIC ([facility] ONLY) - Abnormal; Notable for the following:    Color, Urine YELLOW (*)    APPearance CLEAR (*)    Glucose, UA 50 (*)    Leukocytes, UA 1+ (*)    Squamous Epithelial / LPF 0-5 (*)    All other components within normal limits  MAGNESIUM - Abnormal; Notable for the following:    Magnesium 1.5 (*)    All other components within normal limits  CULTURE, BLOOD (ROUTINE X 2)  CULTURE, BLOOD (ROUTINE X 2)  URINE CULTURE  LACTIC ACID, PLASMA  TROPONIN I  TSH  HEMOGLOBIN T8U  BASIC METABOLIC PANEL  CBC     ____________________________________________   EKG  None  ____________________________________________    RADIOLOGY  CXR  IMPRESSION: Borderline cardiac enlargement. No edema or consolidation.   ____________________________________________   PROCEDURES  Procedure(s) performed: None  Critical Care performed: No  ____________________________________________   INITIAL IMPRESSION / ASSESSMENT AND PLAN / ED COURSE  Pertinent labs & imaging results that were available during my care of the patient were reviewed by me and considered in my medical decision making (see chart for details).  Patient presented to the emergency department today after episode of low blood pressure and dizziness. Patient's initial blood pressure was low here.  Patient's blood pressure did respond well to fluids. Patient did have a mild lactic acidosis. No leukocytosis or fever here. This point unclear etiology of the hypotension however dehydration or infection or my leading diagnosis. Will plan on admission to the hospital service for further workup and management.  ____________________________________________   FINAL CLINICAL IMPRESSION(S) / ED DIAGNOSES  Final diagnoses:  Hypotension, unspecified hypotension type  Lactic acidosis     Nance Pear, MD 09/15/15 8280

## 2015-09-14 NOTE — ED Notes (Addendum)
Patient to Rm 26 c/o dizziness. Patient states that her symptoms started yesterday, patient states that dizziness is worse when she goes from lying to sitting. Patient denies N/V/D. Patient was recently hospitalized for UTI and pneumonia.

## 2015-09-14 NOTE — ED Notes (Signed)
Pt states she has been feeling dizzy for the past 2 days and the home health nurse checked her b/p systolic in the 04'U and referred to the ED.Marland Kitchen Pt denies resent N/V/D, denies pain or other sx .. I just feel dizzy.

## 2015-09-14 NOTE — H&P (Addendum)
[facility] at [place] NAME: Debra [name]    MR#:  [id number]  DATE OF BIRTH:  Jan 14, 1933  DATE OF ADMISSION:  09/14/2015  PRIMARY CARE PHYSICIAN: Volanda Napoleon, MD   REQUESTING/REFERRING PHYSICIAN:   CHIEF COMPLAINT:   Chief Complaint  Patient presents with  . Hypotension  . Dizziness    HISTORY OF PRESENT ILLNESS: Debra [name]  is a 82 y.o. female with a known history of chronic respiratory failure on 2 L of oxygen through nasal cannula, history of COPD, coronary artery disease, status post triple coronary artery bypass graft in  2007, recent diagnosis of pneumonia, admission for it in December 2016,  who presents to the hospital with complaints of weakness and dizziness and feeling presyncopal. In emergency room, she was noted to be hypotensive with systolic-blood pressure 86. Creatinine was elevated from the baseline . Her lactic acid level was also slightly elevated and hospitalist services were contacted for admission. Patient denies fevers or chills, but admits of dysuria.   PAST MEDICAL HISTORY:   Past Medical History  Diagnosis Date  . COPD (chronic obstructive pulmonary disease) ([place])   . Asthma   . Myocardial infarction [facility])     PAST SURGICAL HISTORY: Past Surgical History  Procedure Laterality Date  . Abdominal hysterectomy    . Cholecystectomy    . Tonsillectomy    . Breast surgery    . Bypass graft  2007    triple    SOCIAL HISTORY:  Social History  Substance Use Topics  . Smoking status: Current Some Day Smoker  . Smokeless tobacco: Not on file  . Alcohol Use: No    FAMILY HISTORY:  Family History  Problem Relation Age of Onset  . CAD Father     DRUG ALLERGIES:  Allergies  Allergen Reactions  . Aspirin Other (See Comments)    Reaction:  Unknown   . Penicillins Other (See Comments)    Reaction:  Unknown   . Codeine Itching and Rash    Review of Systems  Constitutional: Negative  for fever, chills, weight loss and malaise/fatigue.  HENT: Negative for congestion.   Eyes: Negative for blurred vision and double vision.  Respiratory: Positive for cough and shortness of breath. Negative for sputum production and wheezing.   Cardiovascular: Negative for chest pain, palpitations, orthopnea, leg swelling and PND.  Gastrointestinal: Negative for nausea, vomiting, abdominal pain, diarrhea, constipation, blood in stool and melena.  Genitourinary: Positive for dysuria. Negative for urgency, frequency and hematuria.  Musculoskeletal: Negative for falls.  Skin: Negative for rash.  Neurological: Positive for weakness. Negative for dizziness.  Psychiatric/Behavioral: Negative for depression and memory loss. The patient is not nervous/anxious.     MEDICATIONS AT HOME:  Prior to Admission medications   Medication Sig Start Date End Date Taking? Authorizing Provider  albuterol (PROVENTIL HFA;VENTOLIN HFA) 108 (90 Base) MCG/ACT inhaler Inhale 2 puffs into the lungs every 6 (six) hours as needed for wheezing or shortness of breath.   Yes Historical Provider, MD  albuterol (PROVENTIL) (2.5 MG/3ML) 0.083% nebulizer solution Take 2.5 mg by nebulization 4 (four) times daily as needed for wheezing or shortness of breath.   Yes Historical Provider, MD  ALPRAZolam Duanne Moron) 0.5 MG tablet Take 0.5 mg by mouth 2 (two) times daily.    Yes Historical Provider, MD  clopidogrel (PLAVIX) 75 MG tablet Take 75 mg by mouth daily.    Yes Historical Provider, MD  diclofenac sodium (VOLTAREN) 1 %  GEL Apply 2 g topically 2 (two) times daily as needed (for pain).   Yes Historical Provider, MD  FLUoxetine (PROZAC) 20 MG capsule Take 1 capsule (20 mg total) by mouth daily. 09/01/15  Yes Aldean Jewett, MD  hydrochlorothiazide (HYDRODIURIL) 25 MG tablet Take 1 tablet (25 mg total) by mouth daily. 09/01/15  Yes Aldean Jewett, MD  HYDROcodone-acetaminophen (NORCO/VICODIN) 5-325 MG tablet Take 0.5-1 tablets by  mouth daily as needed for moderate pain.    Yes Historical Provider, MD  losartan (COZAAR) 50 MG tablet Take 1 tablet (50 mg total) by mouth daily. 09/01/15  Yes Aldean Jewett, MD  mometasone-formoterol (DULERA) 100-5 MCG/ACT AERO Inhale 2 puffs into the lungs 2 (two) times daily. 09/01/15  Yes Aldean Jewett, MD  pantoprazole (PROTONIX) 40 MG tablet Take 40 mg by mouth daily.    Yes Historical Provider, MD  tiotropium (SPIRIVA HANDIHALER) 18 MCG inhalation capsule Place 1 capsule (18 mcg total) into inhaler and inhale every morning. 09/01/15  Yes Aldean Jewett, MD  tiZANidine (ZANAFLEX) 2 MG tablet Take 2 mg by mouth 3 (three) times daily as needed for muscle spasms.    Yes Historical Provider, MD  traZODone (DESYREL) 50 MG tablet Take 50-100 mg by mouth at bedtime as needed for sleep.    Yes Historical Provider, MD  predniSONE (DELTASONE) 20 MG tablet Take 1 tablet (20 mg total) by mouth daily with breakfast. Patient not taking: Reported on 09/14/2015 09/01/15   Aldean Jewett, MD      PHYSICAL EXAMINATION:   VITAL SIGNS: Blood pressure 124/79, pulse 81, temperature 97.7 F (36.5 C), temperature source Oral, resp. rate 22, height '5\' 1"'$  (1.549 m), weight 77.565 kg (171 lb), SpO2 96 %.  GENERAL:  82 y.o.-year-old patient lying in the bed with no acute distress. Somewhat dry oral mucosa.  EYES: Pupils equal, round, reactive to light and accommodation. No scleral icterus. Extraocular muscles intact.  HEENT: Head atraumatic, normocephalic. Oropharynx and nasopharynx clear.  NECK:  Supple, no jugular venous distention. No thyroid enlargement, no tenderness.  LUNGS: Normal breath sounds bilaterally, no wheezing, rales,rhonchi or crepitation. No use of accessory muscles of respiration.  CARDIOVASCULAR: S1, S2 normal. No murmurs, rubs, or gallops.  ABDOMEN: Soft, nontender, nondistended. Bowel sounds present. No organomegaly or mass.  EXTREMITIES: No pedal edema, cyanosis, or clubbing.   NEUROLOGIC: Cranial nerves II through XII are intact. Muscle strength 5/5 in all extremities. Sensation intact. Gait not checked.  PSYCHIATRIC: The patient is alert and oriented x 3.  SKIN: No obvious rash, lesion, or ulcer.   LABORATORY PANEL:   CBC  Recent Labs Lab 09/14/15 1545  WBC 6.6  HGB 10.5*  HCT 31.9*  PLT 194  MCV 99.4  MCH 32.7  MCHC 32.9  RDW 14.5  LYMPHSABS 1.6  MONOABS 0.7  EOSABS 0.1  BASOSABS 0.0   ------------------------------------------------------------------------------------------------------------------  Chemistries   Recent Labs Lab 09/14/15 1545  NA 139  K 3.3*  CL 93*  CO2 38*  GLUCOSE 101*  BUN 25*  CREATININE 1.92*  CALCIUM 9.4   ------------------------------------------------------------------------------------------------------------------  Cardiac Enzymes  Recent Labs Lab 09/14/15 1545  TROPONINI <0.03   ------------------------------------------------------------------------------------------------------------------  RADIOLOGY: Dg Chest 1 View  09/14/2015  CLINICAL DATA:  Dizziness and hypotension EXAM: CHEST 1 VIEW COMPARISON:  September 01, 2015 FINDINGS: There is no edema or consolidation. Heart is borderline enlarged with pulmonary vascularity within normal limits. No adenopathy. Patient is status post coronary artery bypass grafting. There is degenerative  change in each shoulder. IMPRESSION: Borderline cardiac enlargement.  No edema or consolidation. Electronically Signed   By: Lowella Grip III M.D.   On: 09/14/2015 19:03    EKG: Orders placed or performed during the hospital encounter of 08/26/15  . EKG 12-Lead  . EKG 12-Lead  . ED EKG  . ED EKG  . EKG   sinus rhythm at 78 bpm, normal axis, ventricular premature complex, no acute ST-T changes, T depression in V2, no significant change since December 2016  IMPRESSION AND PLAN:  Principal Problem:   Hypotension Active Problems:   Acute on chronic renal  failure (HCC)   Acidosis   Hypokalemia  #1 . Hypotension, admit patient for observation. Hold blood pressure medications. Initiate IV fluids, hold HCTZ #2. Acute on chronic renal failure, dehydrated patient, rehydrate. Follow kidney function tomorrow morning, getting urinalysis and urinary cultures #3. Acidosis. Follow with rehydration #4. Hypokalemia, likely due to HCTZ, and likely cause of patient's muscle cramps, replenished intravenously and orally. Check magnesium level #5 dysuria. Getting urine analysis and urine culture. Patient was given 1 dose of Rocephin in the emergency room   All the records are reviewed and case discussed with ED provider. Management plans discussed with the patient, family and they are in agreement.  CODE STATUS: Full code   TOTAL TIME TAKING CARE OF THIS PATIENT: 50 minutes.   Discussed with patient's son Theodoro Grist M.D on 09/14/2015 at 7:25 PM  Between 7am to 6pm - Pager - [phone] After 6pm go to [email or web address] - password [email or web address] [place] [facility]  Office  [phone]  CC: Primary care physician; Volanda Napoleon, MD

## 2015-09-15 LAB — BASIC METABOLIC PANEL
ANION GAP: 7 (ref 5–15)
BUN: 21 mg/dL — ABNORMAL HIGH (ref 6–20)
CO2: 32 mmol/L (ref 22–32)
Calcium: 8.3 mg/dL — ABNORMAL LOW (ref 8.9–10.3)
Chloride: 100 mmol/L — ABNORMAL LOW (ref 101–111)
Creatinine, Ser: 1.45 mg/dL — ABNORMAL HIGH (ref 0.44–1.00)
GFR calc Af Amer: 38 mL/min — ABNORMAL LOW (ref >60)
GFR calc non Af Amer: 33 mL/min — ABNORMAL LOW (ref >60)
GLUCOSE: 91 mg/dL (ref 65–99)
POTASSIUM: 4.1 mmol/L (ref 3.5–5.1)
Sodium: 139 mmol/L (ref 135–145)

## 2015-09-15 LAB — CBC
HEMATOCRIT: 28.9 % — AB (ref 35.0–47.0)
HEMOGLOBIN: 9.7 g/dL — AB (ref 12.0–16.0)
MCH: 34 pg (ref 26.0–34.0)
MCHC: 33.6 g/dL (ref 32.0–36.0)
MCV: 101.1 fL — AB (ref 80.0–100.0)
Platelets: 172 10*3/uL (ref 150–440)
RBC: 2.86 MIL/uL — ABNORMAL LOW (ref 3.80–5.20)
RDW: 14.8 % — ABNORMAL HIGH (ref 11.5–14.5)
WBC: 5.6 10*3/uL (ref 3.6–11.0)

## 2015-09-15 LAB — HEMOGLOBIN A1C: HEMOGLOBIN A1C: 5.5 % (ref 4.0–6.0)

## 2015-09-15 MED ORDER — SODIUM CHLORIDE 0.9 % IV SOLN
INTRAVENOUS | Status: AC
  Administered 2015-09-15 – 2015-09-16 (×2): via INTRAVENOUS

## 2015-09-15 NOTE — Care Management Obs Status (Signed)
[place] NOTIFICATION   Patient Details  Name: NINEL ABDELLA MRN: [id number] Date of Birth: 1932/11/18   Medicare Observation Status Notification Given:  Yes    Alvie Heidelberg, RN 09/15/2015, 3:39 PM

## 2015-09-15 NOTE — Progress Notes (Signed)
[place] at [place] NAME: Debra [name]    MR#:  [id number]  DATE OF BIRTH:  18-Sep-1932  SUBJECTIVE: Patient admitted for hypotension with weakness, dizziness found to have a hypokalemia, acute on chronic renal failure.  Says she  feels much better today. She denies any dizziness, nausea, vomiting. Her pressure improved.   CHIEF COMPLAINT:   Chief Complaint  Patient presents with  . Hypotension  . Dizziness    REVIEW OF SYSTEMS:    Review of Systems  Constitutional: Negative for fever and chills.  HENT: Negative for ear discharge and hearing loss.   Eyes: Negative for blurred vision, double vision and photophobia.  Respiratory: Negative for cough, hemoptysis and shortness of breath.   Cardiovascular: Negative for palpitations, orthopnea and leg swelling.  Gastrointestinal: Negative for heartburn, nausea, vomiting, abdominal pain and diarrhea.  Genitourinary: Negative for dysuria and urgency.  Musculoskeletal: Negative for myalgias and neck pain.  Skin: Negative for rash.  Neurological: Negative for dizziness, focal weakness, seizures, weakness and headaches.  Psychiatric/Behavioral: Negative for memory loss. The patient does not have insomnia.     Nutrition:  Tolerating Diet: Tolerating PT:      DRUG ALLERGIES:   Allergies  Allergen Reactions  . Aspirin Other (See Comments)    Reaction:  Unknown   . Penicillins Other (See Comments)    Reaction:  Unknown   . Codeine Itching and Rash    VITALS:  Blood pressure 127/55, pulse 93, temperature 97.5 F (36.4 C), temperature source Oral, resp. rate 20, height '5\' 1"'$  (1.549 m), weight 76.975 kg (169 lb 11.2 oz), SpO2 95 %.  PHYSICAL EXAMINATION:   Physical Exam  GENERAL:  82 y.o.-year-old patient lying in the bed with no acute distress.  EYES: Pupils equal, round, reactive to light and accommodation. No scleral icterus. Extraocular muscles intact.  HEENT: Head  atraumatic, normocephalic. Oropharynx and nasopharynx clear.  NECK:  Supple, no jugular venous distention. No thyroid enlargement, no tenderness.  LUNGS: Normal breath sounds bilaterally, no wheezing, rales,rhonchi or crepitation. No use of accessory muscles of respiration.  CARDIOVASCULAR: S1, S2 normal. No murmurs, rubs, or gallops.  ABDOMEN: Soft, nontender, nondistended. Bowel sounds present. No organomegaly or mass.  EXTREMITIES: No pedal edema, cyanosis, or clubbing.  NEUROLOGIC: Cranial nerves II through XII are intact. Muscle strength 5/5 in all extremities. Sensation intact. Gait not checked.  PSYCHIATRIC: The patient is alert and oriented x 3.  SKIN: No obvious rash, lesion, or ulcer.    LABORATORY PANEL:   CBC  Recent Labs Lab 09/15/15 0527  WBC 5.6  HGB 9.7*  HCT 28.9*  PLT 172   ------------------------------------------------------------------------------------------------------------------  Chemistries   Recent Labs Lab 09/14/15 1545 09/15/15 0527  NA 139 139  K 3.3* 4.1  CL 93* 100*  CO2 38* 32  GLUCOSE 101* 91  BUN 25* 21*  CREATININE 1.92* 1.45*  CALCIUM 9.4 8.3*  MG 1.5*  --    ------------------------------------------------------------------------------------------------------------------  Cardiac Enzymes  Recent Labs Lab 09/14/15 1545  TROPONINI <0.03   ------------------------------------------------------------------------------------------------------------------  RADIOLOGY:  Dg Chest 1 View  09/14/2015  CLINICAL DATA:  Dizziness and hypotension EXAM: CHEST 1 VIEW COMPARISON:  September 01, 2015 FINDINGS: There is no edema or consolidation. Heart is borderline enlarged with pulmonary vascularity within normal limits. No adenopathy. Patient is status post coronary artery bypass grafting. There is degenerative change in each shoulder. IMPRESSION: Borderline cardiac enlargement.  No edema or consolidation. Electronically Signed   By:  Lowella Grip III M.D.   On: 09/14/2015 19:03     ASSESSMENT AND PLAN:   Principal Problem:   Hypotension Active Problems:   Acute on chronic renal failure (HCC)   Acidosis   Hypokalemia   #1. hypotension.. The patient is improved with IV hydration, holding the hctz.. bp is better.. Dr. 2. Hypokalemia resolved. #3 acute on chronic renal failure with ATN secondary to hypotension: Improvingwith IV hydration.. Adjust IV fluids, continue to hold  hctz today. Likely discharge tomorrow. #4 history of COPD on chronic oxygen 2 L: No wheezing today. Continue Spiriva, Dulera. 5/h/o CAD s/p CABG,;continue  plavix.  All the records are reviewed and case discussed with Care Management/Social Workerr. Management plans discussed with the patient, family and they are in agreement.  CODE STATUS: full TOTAL TIME TAKING CARE OF THIS PATIENT: 35 minutes.   POSSIBLE D/C IN 1-2 DAYS, DEPENDING ON CLINICAL CONDITION.   Epifanio Lesches M.D on 09/15/2015 at 9:48 AM  Between 7am to 6pm - Pager - [phone]  After 6pm go to [email or web address] - password [email or web address] [place] [facility]  Office  [phone]  CC: Primary care physician; Volanda Napoleon, MD

## 2015-09-16 LAB — URINE CULTURE: Culture: NO GROWTH

## 2015-09-16 MED ORDER — AZITHROMYCIN 500 MG PO TABS: 500.0000 mg | ORAL_TABLET | Freq: Every day | ORAL | Status: DC

## 2015-09-16 NOTE — Progress Notes (Signed)
Pt stable. IV and tele removed. D/c instructions given and education provided. Pt states she understands instructions. Pt dressed and escorted out by staff. Driven home by family.

## 2015-09-16 NOTE — Care Management (Signed)
Resumption of Home Health Orders placed with Corliss Blacker at [facility].

## 2015-09-16 NOTE — Care Management (Signed)
Patient was open to [facility] prior to hospitalization. O2 provided by [facility]. Will need reumtion of Home Health orders at discharge.

## 2015-09-16 NOTE — Care Management (Signed)
Received call from tim Henderson at [facility] was not open to them but [facility]. Spoke with patient to confirm ans she said that this was correct she ahd forgotten that it was changed the last time she was here in the hospital . Referral placed with Sharmon Revere at [facility].

## 2015-09-16 NOTE — Progress Notes (Signed)
[facility] NAME: Debra [name]    MR#:  [id number]  DATE OF BIRTH:  [birth year]  SUBJECTIVE: Patient admitted for hypotension with weakness, dizziness found to have a hypokalemia, acute on chronic renal failure.  Does have some cough but wants to go home today.   CHIEF COMPLAINT:   Chief Complaint  Patient presents with  . Hypotension  . Dizziness    REVIEW OF SYSTEMS:    Review of Systems  Constitutional: Negative for fever and chills.  HENT: Negative for ear discharge and hearing loss.   Eyes: Negative for blurred vision, double vision and photophobia.  Respiratory: Negative for cough, hemoptysis and shortness of breath.   Cardiovascular: Negative for palpitations, orthopnea and leg swelling.  Gastrointestinal: Negative for heartburn, nausea, vomiting, abdominal pain and diarrhea.  Genitourinary: Negative for dysuria and urgency.  Musculoskeletal: Negative for myalgias and neck pain.  Skin: Negative for rash.  Neurological: Negative for dizziness, focal weakness, seizures, weakness and headaches.  Psychiatric/Behavioral: Negative for memory loss. The patient does not have insomnia.     Nutrition:  Tolerating Diet: Tolerating PT:      DRUG ALLERGIES:   Allergies  Allergen Reactions  . Aspirin Other (See Comments)    Reaction:  Unknown   . Penicillins Other (See Comments)    Reaction:  Unknown   . Codeine Itching and Rash    VITALS:  Blood pressure 138/76, pulse 76, temperature 98.3 F (36.8 C), temperature source Oral, resp. rate 20, height '5\' 1"'$  (1.549 m), weight 76.975 kg (169 lb 11.2 oz), SpO2 97 %.  PHYSICAL EXAMINATION:   Physical Exam  GENERAL:  82 y.o.-year-old patient lying in the bed with no acute distress.  EYES: Pupils equal, round, reactive to light and accommodation. No scleral icterus. Extraocular muscles intact.  HEENT: Head atraumatic, normocephalic. Oropharynx and nasopharynx  clear.  NECK:  Supple, no jugular venous distention. No thyroid enlargement, no tenderness.  LUNGS: Normal breath sounds bilaterally, no wheezing, rales,rhonchi or crepitation. No use of accessory muscles of respiration.  CARDIOVASCULAR: S1, S2 normal. No murmurs, rubs, or gallops.  ABDOMEN: Soft, nontender, nondistended. Bowel sounds present. No organomegaly or mass.  EXTREMITIES: No pedal edema, cyanosis, or clubbing.  NEUROLOGIC: Cranial nerves II through XII are intact. Muscle strength 5/5 in all extremities. Sensation intact. Gait not checked.  PSYCHIATRIC: The patient is alert and oriented x 3.  SKIN: No obvious rash, lesion, or ulcer.    LABORATORY PANEL:   CBC  Recent Labs Lab 09/15/15 0527  WBC 5.6  HGB 9.7*  HCT 28.9*  PLT 172   ------------------------------------------------------------------------------------------------------------------  Chemistries   Recent Labs Lab 09/14/15 1545 09/15/15 0527  NA 139 139  K 3.3* 4.1  CL 93* 100*  CO2 38* 32  GLUCOSE 101* 91  BUN 25* 21*  CREATININE 1.92* 1.45*  CALCIUM 9.4 8.3*  MG 1.5*  --    ------------------------------------------------------------------------------------------------------------------  Cardiac Enzymes  Recent Labs Lab 09/14/15 1545  TROPONINI <0.03   ------------------------------------------------------------------------------------------------------------------  RADIOLOGY:  No results found.   ASSESSMENT AND PLAN:   Principal Problem:   Hypotension Active Problems:   Acute on chronic renal failure (HCC)   Acidosis   Hypokalemia   #1. hypotension.. The patient is improved with IV hydration, advised to start home meds .from sunday 2. Hypokalemia resolved. #3 acute on chronic renal failure with ATN secondary to hypotension: Improved with hydration.   #4 history of copd;on oxygen 2 L: No  wheezing today. Continue Spiriva, Dulera. Added azithromycin due to cough and possible  bronchitis, 5/h/o CAD s/p CABG,;continue  plavix. Discharge home today. All the records are reviewed and case discussed with Care Management/Social Workerr. Management plans discussed with the patient, family and they are in agreement.  CODE STATUS: full TOTAL TIME TAKING CARE OF THIS PATIENT: 35 minutes.   POSSIBLE D/C IN 1-2 DAYS, DEPENDING ON CLINICAL CONDITION.   Epifanio Lesches M.D on 09/16/2015 at 9:25 PM  Between 7am to 6pm - Pager - [phone]  After 6pm go to [email or web address] - password [email or web address] [facility]  Office  [phone]  CC: Primary care physician; Volanda Napoleon, MD

## 2015-09-17 NOTE — Discharge Summary (Signed)
Debra [name], is a 82 y.o. female  DOB [birth year]  MRN [id number].  Admission date:  09/14/2015  Admitting Physician  Theodoro Grist, MD  Discharge Date:  09/17/2015   Primary MD  Volanda Napoleon, MD  Recommendations for primary care physician for things to follow:  Follow-up with primary doctor in 2-3 days Have BMP drawn,   Admission Diagnosis  low blood pressure   Discharge Diagnosis  low blood pressure    Principal Problem:   Hypotension Active Problems:   Acute on chronic renal failure (HCC)   Acidosis   Hypokalemia      Past Medical History  Diagnosis Date  . COPD (chronic obstructive pulmonary disease) ([place])   . Asthma   . Myocardial infarction [facility])     Past Surgical History  Procedure Laterality Date  . Abdominal hysterectomy    . Cholecystectomy    . Tonsillectomy    . Breast surgery    . Bypass graft  2007    triple       History of present illness and  Hospital Course:     Kindly see H&P for history of present illness and admission details, please review complete Labs, Consult reports and Test reports for all details in brief  HPI  from the history and physical done on the day of admission 82 year old female patient admitted because of dizziness, weakness found to have hypotension, hypokalemia and acute on chronic renal failure. Admitted for the same.   Hospital Course  #1 hypotension likely secondary to overdiuresis: Improved with IV hydration. Stopped HCTz and losaran. It improved to 131/63. Initial blood pressure was 77/50. Advised to start hctz., losartan from Jan 8th. 2. Acute on chronic renal failure: ATN secondary to hypotension; vision creatinine 1.9 to on admission. BUN 25. Improved with hydration, Improved it is 1.5 on discharge. Does have chronic kidney disease stage  III.  3. hypokalemia secondary to diuretics: Improved with replacement. 4. Acute bronchitis started on azithromycin and discharged home with azithromycin. #5. history of COPD with chronic oxygen 2 L. Continued on home medications with Spiriva, Albuterol,, Dulera.   Discharge Condition: stable   Follow UP   all up with primary doctor in 1 week   Discharge Instructions  and  Discharge Medications        Medication List    TAKE these medications        albuterol (2.5 MG/3ML) 0.083% nebulizer solution  Commonly known as:  PROVENTIL  Take 2.5 mg by nebulization 4 (four) times daily as needed for wheezing or shortness of breath.     albuterol 108 (90 Base) MCG/ACT inhaler  Commonly known as:  PROVENTIL HFA;VENTOLIN HFA  Inhale 2 puffs into the lungs every 6 (six) hours as needed for wheezing or shortness of breath.     ALPRAZolam 0.5 MG tablet  Commonly known as:  XANAX  Take 0.5 mg by mouth 2 (two) times daily.     azithromycin 500 MG tablet  Commonly known as:  ZITHROMAX  Take 1 tablet (500 mg total) by mouth daily.     clopidogrel 75 MG tablet  Commonly known as:  PLAVIX  Take 75 mg by mouth daily.     diclofenac sodium 1 % Gel  Commonly known as:  VOLTAREN  Apply 2 g topically 2 (two) times daily as needed (for pain).     FLUoxetine 20 MG capsule  Commonly known as:  PROZAC  Take 1 capsule (20 mg total) by mouth  daily.     hydrochlorothiazide 25 MG tablet  Commonly known as:  HYDRODIURIL  Take 1 tablet (25 mg total) by mouth daily.     HYDROcodone-acetaminophen 5-325 MG tablet  Commonly known as:  NORCO/VICODIN  Take 0.5-1 tablets by mouth daily as needed for moderate pain.     losartan 50 MG tablet  Commonly known as:  COZAAR  Take 1 tablet (50 mg total) by mouth daily.     mometasone-formoterol 100-5 MCG/ACT Aero  Commonly known as:  DULERA  Inhale 2 puffs into the lungs 2 (two) times daily.     pantoprazole 40 MG tablet  Commonly known as:   PROTONIX  Take 40 mg by mouth daily.     predniSONE 20 MG tablet  Commonly known as:  DELTASONE  Take 1 tablet (20 mg total) by mouth daily with breakfast.     tiotropium 18 MCG inhalation capsule  Commonly known as:  SPIRIVA HANDIHALER  Place 1 capsule (18 mcg total) into inhaler and inhale every morning.     tiZANidine 2 MG tablet  Commonly known as:  ZANAFLEX  Take 2 mg by mouth 3 (three) times daily as needed for muscle spasms.     traZODone 50 MG tablet  Commonly known as:  DESYREL  Take 50-100 mg by mouth at bedtime as needed for sleep.          Diet and Activity recommendation: See Discharge Instructions above   Consults obtained - none   Major procedures and Radiology Reports - PLEASE review detailed and final reports for all details, in brief -      Ct Abdomen Pelvis Wo Contrast  08/27/2015  CLINICAL DATA:  Cough and shortness of breath with back pain EXAM: CT CHEST, ABDOMEN AND PELVIS WITHOUT CONTRAST TECHNIQUE: Multidetector CT imaging of the chest, abdomen and pelvis was performed following the standard protocol without IV contrast. COMPARISON:  None. FINDINGS: CT CHEST FINDINGS Lungs are well aerated bilaterally. Diffuse emphysematous changes are seen. Some scattered nodular changes are noted particularly in the apices bilaterally. The largest of these measures 9 mm in the left upper lobe best seen on image number 9 of series for. No focal infiltrate or effusion is noted. The thoracic inlet demonstrates diffuse calcification of the thoracic aorta and its branches. Coronary calcifications are seen as well. No hilar or mediastinal adenopathy is noted. The osseous structures show changes of prior vertebral augmentation and prior median sternotomy. CT ABDOMEN AND PELVIS FINDINGS The gallbladder has been surgically removed. The liver, spleen, adrenal glands and pancreas are within normal limits. The kidneys are well visualized bilaterally and demonstrate an extrarenal  pelvis on the right without obstructive change a 3 cm cyst is noted within the midportion of the left kidney. A nonobstructing left renal stone is noted measuring approximately 5 mm. Diffuse aortoiliac calcifications are identified. The appendix is not well visualized although no inflammatory changes are seen to suggest appendicitis. Scattered diverticular changes noted. The bladder is well distended. The bony structures show vertebral augmentation at L4 with chronic compression deformity at L2. IMPRESSION: Apical parenchymal nodules within the lungs bilaterally. If the patient is at high risk for bronchogenic carcinoma, follow-up chest CT at 3-102month is recommended. If the patient is at low risk for bronchogenic carcinoma, follow-up chest CT at 6-12 months is recommended. This recommendation follows the consensus statement: Guidelines for Management of Small Pulmonary Nodules Detected on CT Scans: A Statement from the [place] published in Radiology 2005; 237:395-400.  Left renal cystic change. Nonobstructing left renal stone Electronically Signed   By: Inez Catalina M.D.   On: 08/27/2015 07:16   Dg Chest 1 View  09/14/2015  CLINICAL DATA:  Dizziness and hypotension EXAM: CHEST 1 VIEW COMPARISON:  September 01, 2015 FINDINGS: There is no edema or consolidation. Heart is borderline enlarged with pulmonary vascularity within normal limits. No adenopathy. Patient is status post coronary artery bypass grafting. There is degenerative change in each shoulder. IMPRESSION: Borderline cardiac enlargement.  No edema or consolidation. Electronically Signed   By: Lowella Grip III M.D.   On: 09/14/2015 19:03   Dg Chest 2 View  09/01/2015  CLINICAL DATA:  Admitted for shortness of breath and COPD exacerbation with improving respiratory status. Atypical pneumonia per ordering physician. EXAM: CHEST  2 VIEW COMPARISON:  08/30/2015 as well as 08/26/2015 and 09/13/2014 FINDINGS: Sternotomy wires unchanged.  Lungs are adequately inflated demonstrate persistent diffuse bilateral increased interstitial markings with patchy focal interstitial change in the right mid to lower lung. These findings are unchanged from 08/30/2015 bowel worse compared to the older exams. No focal lobar consolidation. No definite effusion. Mild flattening of the hemidiaphragms on the lateral film. Mild stable cardiomegaly. Calcification over the thoracic aorta. Multilevel previous kyphoplasties. IMPRESSION: Persistent acute bilateral increased interstitial markings with patchy interstitial change over the right mid to lower lung which may reflect an atypical infectious or inflammatory process. Stable cardiomegaly. Electronically Signed   By: Marin Olp M.D.   On: 09/01/2015 09:51   Dg Chest 2 View  08/30/2015  CLINICAL DATA:  82 year old female admitted 3 days ago for shortness of breath, urinary tract infection. Current history of COPD, hypoxia. Initial encounter. EXAM: CHEST  2 VIEW COMPARISON:  08/28/2015 and earlier. FINDINGS: Acute on chronic reticulonodular opacity in the right mid and lower lung. Underlying coarse chronic pulmonary interstitial markings. Stable lung volumes. No pneumothorax or pleural effusion. No consolidation. Stable cardiac size and mediastinal contours. Sequelae of CABG. IMPRESSION: 1. Acute viral/atypical infection in the right lung superimposed on chronic lung disease. 2. No pleural effusion. Electronically Signed   By: Genevie Ann M.D.   On: 08/30/2015 08:03   Dg Chest 2 View  08/26/2015  CLINICAL DATA:  Cough and shortness of breath. EXAM: CHEST  2 VIEW COMPARISON:  Radiographs 07/13/2015 FINDINGS: The lungs are hyperinflated with emphysema. Patient is post median sternotomy. Stable cardiomegaly and tortuous thoracic aorta. No confluent airspace disease, evident pulmonary edema, pleural effusion or pneumothorax. The bones are under mineralized, kyphoplasty within multiple vertebra. IMPRESSION: Stable  hyperinflation.  No acute process. Electronically Signed   By: Jeb Levering M.D.   On: 08/26/2015 23:33   Ct Chest Wo Contrast  08/27/2015  CLINICAL DATA:  Cough and shortness of breath with back pain EXAM: CT CHEST, ABDOMEN AND PELVIS WITHOUT CONTRAST TECHNIQUE: Multidetector CT imaging of the chest, abdomen and pelvis was performed following the standard protocol without IV contrast. COMPARISON:  None. FINDINGS: CT CHEST FINDINGS Lungs are well aerated bilaterally. Diffuse emphysematous changes are seen. Some scattered nodular changes are noted particularly in the apices bilaterally. The largest of these measures 9 mm in the left upper lobe best seen on image number 9 of series for. No focal infiltrate or effusion is noted. The thoracic inlet demonstrates diffuse calcification of the thoracic aorta and its branches. Coronary calcifications are seen as well. No hilar or mediastinal adenopathy is noted. The osseous structures show changes of prior vertebral augmentation and prior median sternotomy. CT ABDOMEN  AND PELVIS FINDINGS The gallbladder has been surgically removed. The liver, spleen, adrenal glands and pancreas are within normal limits. The kidneys are well visualized bilaterally and demonstrate an extrarenal pelvis on the right without obstructive change a 3 cm cyst is noted within the midportion of the left kidney. A nonobstructing left renal stone is noted measuring approximately 5 mm. Diffuse aortoiliac calcifications are identified. The appendix is not well visualized although no inflammatory changes are seen to suggest appendicitis. Scattered diverticular changes noted. The bladder is well distended. The bony structures show vertebral augmentation at L4 with chronic compression deformity at L2. IMPRESSION: Apical parenchymal nodules within the lungs bilaterally. If the patient is at high risk for bronchogenic carcinoma, follow-up chest CT at 3-39month is recommended. If the patient is at low  risk for bronchogenic carcinoma, follow-up chest CT at 6-12 months is recommended. This recommendation follows the consensus statement: Guidelines for Management of Small Pulmonary Nodules Detected on CT Scans: A Statement from the [place] published in Radiology 2005; 237:395-400. Left renal cystic change. Nonobstructing left renal stone Electronically Signed   By: MInez CatalinaM.D.   On: 08/27/2015 07:16   Dg Chest Port 1 View  08/28/2015  CLINICAL DATA:  Shortness of breath.  Urinary tract infection. EXAM: PORTABLE CHEST 1 VIEW COMPARISON:  08/27/2015 FINDINGS: The heart size is enlarged. Previous median sternotomy and CABG procedure. Aortic atherosclerosis noted. There is pulmonary vascular congestion noted. No edema. No airspace consolidation. IMPRESSION: 1. Cardiac enlargement and pulmonary vascular congestion. Electronically Signed   By: TKerby MoorsM.D.   On: 08/28/2015 15:30    Micro Results     Recent Results (from the past 240 hour(s))  Urine culture     Status: None   Collection Time: 09/14/15  3:46 PM  Result Value Ref Range Status   Specimen Description URINE, RANDOM  Final   Special Requests NONE  Final   Culture NO GROWTH 2 DAYS  Final   Report Status 09/16/2015 FINAL  Final  Blood culture (routine x 2)     Status: None (Preliminary result)   Collection Time: 09/14/15  8:49 PM  Result Value Ref Range Status   Specimen Description BLOOD RIGHT ANTECUBITAL  Final   Special Requests BOTTLES DRAWN AEROBIC AND ANAEROBIC 5ML  Final   Culture NO GROWTH 3 DAYS  Final   Report Status PENDING  Incomplete  Blood culture (routine x 2)     Status: None (Preliminary result)   Collection Time: 09/14/15  8:49 PM  Result Value Ref Range Status   Specimen Description BLOOD RIGHT HAND  Final   Special Requests BOTTLES DRAWN AEROBIC AND ANAEROBIC 5ML  Final   Culture NO GROWTH 3 DAYS  Final   Report Status PENDING  Incomplete       Today   Subjective:   DMakelle Marronetoday has no headache,no chest abdominal pain,no new weakness tingling or numbness, feels much better wants to go home today.   Objective:   Blood pressure 138/76, pulse 76, temperature 98.3 F (36.8 C), temperature source Oral, resp. rate 20, height '5\' 1"'$  (1.549 m), weight 76.975 kg (169 lb 11.2 oz), SpO2 97 %.   Intake/Output Summary (Last 24 hours) at 09/17/15 0654 Last data filed at 09/16/15 0900  Gross per 24 hour  Intake 563.75 ml  Output      0 ml  Net 563.75 ml    Exam Awake Alert, Oriented x 3, No new F.N deficits, Normal affect  Supreme.AT,PERRAL Supple Neck,No JVD, No cervical lymphadenopathy appriciated.  Symmetrical Chest wall movement, Good air movement bilaterally, CTAB RRR,No Gallops,Rubs or new Murmurs, No Parasternal Heave +ve B.Sounds, Abd Soft, Non tender, No organomegaly appriciated, No rebound -guarding or rigidity. No Cyanosis, Clubbing or edema, No new Rash or bruise  Data Review   CBC w Diff: Lab Results  Component Value Date   WBC 5.6 09/15/2015   WBC 4.8 09/14/2014   WBC 7.2 07/24/2007   HGB 9.7* 09/15/2015   HGB 11.7* 09/14/2014   HGB 14.4 07/24/2007   HCT 28.9* 09/15/2015   HCT 35.0 09/14/2014   HCT 42.3 07/24/2007   PLT 172 09/15/2015   PLT 159 09/14/2014   PLT 259 07/24/2007   LYMPHOPCT 24 09/14/2015   LYMPHOPCT 13.9 09/14/2014   LYMPHOPCT 40.3 07/24/2007   MONOPCT 11 09/14/2015   MONOPCT 0.8 09/14/2014   MONOPCT 5.2 07/24/2007   EOSPCT 1 09/14/2015   EOSPCT 0.1 09/14/2014   EOSPCT 2.9 07/24/2007   BASOPCT 1 09/14/2015   BASOPCT 0.4 09/14/2014   BASOPCT 0.6 07/24/2007    CMP: Lab Results  Component Value Date   NA 139 09/15/2015   NA 140 09/17/2014   NA 136 12/03/2006   K 4.1 09/15/2015   K 3.8 09/17/2014   K 5.2* 12/03/2006   CL 100* 09/15/2015   CL 98 09/17/2014   CL 102 12/03/2006   CO2 32 09/15/2015   CO2 41* 09/17/2014   CO2 32 12/03/2006   BUN 21* 09/15/2015   BUN 20* 09/17/2014   BUN 19 12/03/2006    CREATININE 1.45* 09/15/2015   CREATININE 1.09 09/17/2014   CREATININE 1.0 12/03/2006   PROT 6.9 08/27/2015   PROT 6.2* 09/13/2014   ALBUMIN 3.4* 08/27/2015   ALBUMIN 2.9* 09/13/2014   BILITOT 2.1* 08/27/2015   BILITOT 0.4 09/13/2014   ALKPHOS 50 08/27/2015   ALKPHOS 49 09/13/2014   AST 18 08/27/2015   AST 20 09/13/2014   ALT 11* 08/27/2015   ALT 14 09/13/2014  .   Total Time in preparing paper work, data evaluation and todays exam - 56 minutes  Cayman Kielbasa M.D on 09/17/2015 at 6:54 AM    Note: This dictation was prepared with Dragon dictation along with smaller phrase technology. Any transcriptional errors that result from this process are unintentional.

## 2015-09-19 LAB — CULTURE, BLOOD (ROUTINE X 2)
CULTURE: NO GROWTH
CULTURE: NO GROWTH

## 2015-09-21 LAB — MISC LABCORP TEST (SEND OUT): Labcorp test code: 9985

## 2016-06-10 ENCOUNTER — Inpatient Hospital Stay
Admission: EM | Admit: 2016-06-10 | Discharge: 2016-06-15 | DRG: 291 | Disposition: A | Discharge status: Skilled Nursing Facility | Payer: Medicare Other | Attending: Internal Medicine | Admitting: Internal Medicine

## 2016-06-10 ENCOUNTER — Inpatient Hospital Stay
Admit: 2016-06-10 | Discharge: 2016-06-10 | Disposition: A | Discharge status: Home / Self Care | Payer: Medicare Other | Attending: Internal Medicine | Admitting: Internal Medicine

## 2016-06-10 ENCOUNTER — Emergency Department: Payer: Medicare Other

## 2016-06-10 ENCOUNTER — Encounter: Payer: Self-pay | Admitting: Emergency Medicine

## 2016-06-10 DIAGNOSIS — Z8249 Family history of ischemic heart disease and other diseases of the circulatory system: Secondary | ICD-10-CM

## 2016-06-10 DIAGNOSIS — J9622 Acute and chronic respiratory failure with hypercapnia: Secondary | ICD-10-CM | POA: Diagnosis present

## 2016-06-10 DIAGNOSIS — I959 Hypotension, unspecified: Secondary | ICD-10-CM | POA: Diagnosis not present

## 2016-06-10 DIAGNOSIS — J9621 Acute and chronic respiratory failure with hypoxia: Secondary | ICD-10-CM

## 2016-06-10 DIAGNOSIS — N179 Acute kidney failure, unspecified: Secondary | ICD-10-CM | POA: Diagnosis present

## 2016-06-10 DIAGNOSIS — I2510 Atherosclerotic heart disease of native coronary artery without angina pectoris: Secondary | ICD-10-CM | POA: Diagnosis present

## 2016-06-10 DIAGNOSIS — Z880 Allergy status to penicillin: Secondary | ICD-10-CM | POA: Diagnosis not present

## 2016-06-10 DIAGNOSIS — I110 Hypertensive heart disease with heart failure: Principal | ICD-10-CM | POA: Diagnosis present

## 2016-06-10 DIAGNOSIS — M6281 Muscle weakness (generalized): Secondary | ICD-10-CM

## 2016-06-10 DIAGNOSIS — F329 Major depressive disorder, single episode, unspecified: Secondary | ICD-10-CM | POA: Diagnosis present

## 2016-06-10 DIAGNOSIS — Z7902 Long term (current) use of antithrombotics/antiplatelets: Secondary | ICD-10-CM

## 2016-06-10 DIAGNOSIS — Z9981 Dependence on supplemental oxygen: Secondary | ICD-10-CM | POA: Diagnosis not present

## 2016-06-10 DIAGNOSIS — J9601 Acute respiratory failure with hypoxia: Secondary | ICD-10-CM | POA: Diagnosis not present

## 2016-06-10 DIAGNOSIS — T502X5A Adverse effect of carbonic-anhydrase inhibitors, benzothiadiazides and other diuretics, initial encounter: Secondary | ICD-10-CM | POA: Diagnosis present

## 2016-06-10 DIAGNOSIS — F17200 Nicotine dependence, unspecified, uncomplicated: Secondary | ICD-10-CM | POA: Diagnosis present

## 2016-06-10 DIAGNOSIS — J441 Chronic obstructive pulmonary disease with (acute) exacerbation: Secondary | ICD-10-CM | POA: Diagnosis present

## 2016-06-10 DIAGNOSIS — I5041 Acute combined systolic (congestive) and diastolic (congestive) heart failure: Secondary | ICD-10-CM | POA: Diagnosis present

## 2016-06-10 DIAGNOSIS — Z23 Encounter for immunization: Secondary | ICD-10-CM | POA: Diagnosis not present

## 2016-06-10 DIAGNOSIS — Z885 Allergy status to narcotic agent status: Secondary | ICD-10-CM

## 2016-06-10 DIAGNOSIS — J189 Pneumonia, unspecified organism: Secondary | ICD-10-CM

## 2016-06-10 DIAGNOSIS — R739 Hyperglycemia, unspecified: Secondary | ICD-10-CM | POA: Diagnosis present

## 2016-06-10 DIAGNOSIS — J9602 Acute respiratory failure with hypercapnia: Secondary | ICD-10-CM | POA: Diagnosis not present

## 2016-06-10 DIAGNOSIS — E872 Acidosis: Secondary | ICD-10-CM | POA: Diagnosis present

## 2016-06-10 DIAGNOSIS — Z886 Allergy status to analgesic agent status: Secondary | ICD-10-CM | POA: Diagnosis not present

## 2016-06-10 DIAGNOSIS — I509 Heart failure, unspecified: Secondary | ICD-10-CM

## 2016-06-10 DIAGNOSIS — E875 Hyperkalemia: Secondary | ICD-10-CM | POA: Diagnosis present

## 2016-06-10 DIAGNOSIS — Z79899 Other long term (current) drug therapy: Secondary | ICD-10-CM | POA: Diagnosis not present

## 2016-06-10 DIAGNOSIS — J209 Acute bronchitis, unspecified: Secondary | ICD-10-CM | POA: Diagnosis not present

## 2016-06-10 DIAGNOSIS — R42 Dizziness and giddiness: Secondary | ICD-10-CM

## 2016-06-10 LAB — CREATININE, SERUM
CREATININE: 1.02 mg/dL — AB (ref 0.44–1.00)
GFR calc Af Amer: 58 mL/min — ABNORMAL LOW (ref >60)
GFR, EST NON AFRICAN AMERICAN: 50 mL/min — AB (ref >60)

## 2016-06-10 LAB — CBC WITH DIFFERENTIAL/PLATELET
BASOS ABS: 0 10*3/uL (ref 0–0.1)
BASOS PCT: 0 %
EOS ABS: 0 10*3/uL (ref 0–0.7)
EOS PCT: 0 %
HCT: 40.5 % (ref 35.0–47.0)
Hemoglobin: 13.6 g/dL (ref 12.0–16.0)
LYMPHS PCT: 4 %
Lymphs Abs: 0.7 10*3/uL — ABNORMAL LOW (ref 1.0–3.6)
MCH: 34.6 pg — ABNORMAL HIGH (ref 26.0–34.0)
MCHC: 33.4 g/dL (ref 32.0–36.0)
MCV: 103.5 fL — ABNORMAL HIGH (ref 80.0–100.0)
MONO ABS: 0.7 10*3/uL (ref 0.2–0.9)
Monocytes Relative: 5 %
Neutro Abs: 14.7 10*3/uL — ABNORMAL HIGH (ref 1.4–6.5)
Neutrophils Relative %: 91 %
PLATELETS: 239 10*3/uL (ref 150–440)
RBC: 3.92 MIL/uL (ref 3.80–5.20)
RDW: 14.2 % (ref 11.5–14.5)
WBC: 16.2 10*3/uL — AB (ref 3.6–11.0)

## 2016-06-10 LAB — COMPREHENSIVE METABOLIC PANEL
ALT: 17 U/L (ref 14–54)
AST: 23 U/L (ref 15–41)
Albumin: 4.1 g/dL (ref 3.5–5.0)
Alkaline Phosphatase: 49 U/L (ref 38–126)
Anion gap: 7 (ref 5–15)
BUN: 14 mg/dL (ref 6–20)
CO2: 30 mmol/L (ref 22–32)
Calcium: 8.5 mg/dL — ABNORMAL LOW (ref 8.9–10.3)
Chloride: 98 mmol/L — ABNORMAL LOW (ref 101–111)
Creatinine, Ser: 0.9 mg/dL (ref 0.44–1.00)
GFR calc Af Amer: >60 mL/min (ref >60)
GFR calc non Af Amer: 58 mL/min — ABNORMAL LOW (ref >60)
Glucose, Bld: 126 mg/dL — ABNORMAL HIGH (ref 65–99)
Potassium: 4.1 mmol/L (ref 3.5–5.1)
Sodium: 135 mmol/L (ref 135–145)
Total Bilirubin: 0.9 mg/dL (ref 0.3–1.2)
Total Protein: 7.4 g/dL (ref 6.5–8.1)

## 2016-06-10 LAB — BLOOD GAS, VENOUS
Acid-Base Excess: 5.2 mmol/L — ABNORMAL HIGH (ref 0.0–2.0)
Bicarbonate: 33.7 mmol/L — ABNORMAL HIGH (ref 20.0–28.0)
O2 Saturation: 54.8 %
Patient temperature: 37
pCO2, Ven: 67 mmHg — ABNORMAL HIGH (ref 44.0–60.0)
pH, Ven: 7.31 (ref 7.250–7.430)
pO2, Ven: 32 mmHg (ref 32.0–45.0)

## 2016-06-10 LAB — URINALYSIS COMPLETE WITH MICROSCOPIC (ARMC ONLY)
BACTERIA UA: NONE SEEN
BILIRUBIN URINE: NEGATIVE
Glucose, UA: NEGATIVE mg/dL
Ketones, ur: NEGATIVE mg/dL
LEUKOCYTES UA: NEGATIVE
NITRITE: NEGATIVE
PH: 5 (ref 5.0–8.0)
Protein, ur: NEGATIVE mg/dL
Specific Gravity, Urine: 1.002 — ABNORMAL LOW (ref 1.005–1.030)

## 2016-06-10 LAB — CBC
HCT: 37.9 % (ref 35.0–47.0)
Hemoglobin: 13.1 g/dL (ref 12.0–16.0)
MCH: 34.7 pg — ABNORMAL HIGH (ref 26.0–34.0)
MCHC: 34.5 g/dL (ref 32.0–36.0)
MCV: 100.7 fL — ABNORMAL HIGH (ref 80.0–100.0)
Platelets: 199 10*3/uL (ref 150–440)
RBC: 3.76 MIL/uL — ABNORMAL LOW (ref 3.80–5.20)
RDW: 14.4 % (ref 11.5–14.5)
WBC: 19.1 10*3/uL — ABNORMAL HIGH (ref 3.6–11.0)

## 2016-06-10 LAB — TROPONIN I
Troponin I: <0.03 ng/mL (ref <0.03)
Troponin I: <0.03 ng/mL (ref <0.03)
Troponin I: <0.03 ng/mL (ref <0.03)

## 2016-06-10 LAB — LACTIC ACID, PLASMA: LACTIC ACID, VENOUS: 2.3 mmol/L — AB (ref 0.5–1.9)

## 2016-06-10 LAB — BRAIN NATRIURETIC PEPTIDE: B Natriuretic Peptide: 281 pg/mL — ABNORMAL HIGH (ref 0.0–100.0)

## 2016-06-10 LAB — TSH: TSH: 0.855 u[IU]/mL (ref 0.350–4.500)

## 2016-06-10 LAB — INFLUENZA PANEL BY PCR (TYPE A & B)
H1N1FLUPCR: NOT DETECTED
INFLAPCR: NEGATIVE
Influenza B By PCR: NEGATIVE

## 2016-06-10 LAB — GLUCOSE, CAPILLARY: GLUCOSE-CAPILLARY: 188 mg/dL — AB (ref 65–99)

## 2016-06-10 LAB — MRSA PCR SCREENING: MRSA by PCR: NEGATIVE

## 2016-06-10 MED ORDER — DEXTROSE 5 % IV SOLN
500.0000 mg | Freq: Once | INTRAVENOUS | Status: AC
  Administered 2016-06-10: 500 mg via INTRAVENOUS
  Filled 2016-06-10: qty 500

## 2016-06-10 MED ORDER — MOMETASONE FURO-FORMOTEROL FUM 100-5 MCG/ACT IN AERO
2.0000 | INHALATION_SPRAY | Freq: Two times a day (BID) | RESPIRATORY_TRACT | Status: DC
  Administered 2016-06-10 – 2016-06-15 (×11): 2 via RESPIRATORY_TRACT
  Filled 2016-06-10: qty 8.8

## 2016-06-10 MED ORDER — INFLUENZA VAC SPLIT QUAD 0.5 ML IM SUSY
0.5000 mL | PREFILLED_SYRINGE | INTRAMUSCULAR | Status: AC
  Administered 2016-06-11: 0.5 mL via INTRAMUSCULAR
  Filled 2016-06-10: qty 0.5

## 2016-06-10 MED ORDER — ASPIRIN 81 MG PO CHEW
81.0000 mg | CHEWABLE_TABLET | ORAL | Status: DC
  Administered 2016-06-10 – 2016-06-15 (×6): 81 mg via ORAL
  Filled 2016-06-10 (×6): qty 1

## 2016-06-10 MED ORDER — TIOTROPIUM BROMIDE MONOHYDRATE 18 MCG IN CAPS
18.0000 ug | ORAL_CAPSULE | Freq: Every morning | RESPIRATORY_TRACT | Status: DC
  Administered 2016-06-10 – 2016-06-15 (×6): 18 ug via RESPIRATORY_TRACT
  Filled 2016-06-10 (×2): qty 5

## 2016-06-10 MED ORDER — BUDESONIDE 0.25 MG/2ML IN SUSP
0.2500 mg | Freq: Two times a day (BID) | RESPIRATORY_TRACT | Status: DC
Start: 2016-06-10 — End: 2016-06-15
  Administered 2016-06-10 – 2016-06-15 (×11): 0.25 mg via RESPIRATORY_TRACT
  Filled 2016-06-10 (×11): qty 2

## 2016-06-10 MED ORDER — METHYLPREDNISOLONE SODIUM SUCC 125 MG IJ SOLR
60.0000 mg | INTRAMUSCULAR | Status: DC
  Administered 2016-06-10: 60 mg via INTRAVENOUS
  Filled 2016-06-10: qty 2

## 2016-06-10 MED ORDER — DICLOFENAC SODIUM 1 % TD GEL
2.0000 g | Freq: Two times a day (BID) | TRANSDERMAL | Status: DC | PRN
  Filled 2016-06-10: qty 100

## 2016-06-10 MED ORDER — LOSARTAN POTASSIUM 50 MG PO TABS
50.0000 mg | ORAL_TABLET | ORAL | Status: DC
  Administered 2016-06-10 – 2016-06-15 (×4): 50 mg via ORAL
  Filled 2016-06-10 (×3): qty 1
  Filled 2016-06-10: qty 2

## 2016-06-10 MED ORDER — IPRATROPIUM-ALBUTEROL 0.5-2.5 (3) MG/3ML IN SOLN
3.0000 mL | Freq: Once | RESPIRATORY_TRACT | Status: AC
  Administered 2016-06-10: 3 mL via RESPIRATORY_TRACT
  Filled 2016-06-10: qty 3

## 2016-06-10 MED ORDER — CLOPIDOGREL BISULFATE 75 MG PO TABS
75.0000 mg | ORAL_TABLET | ORAL | Status: DC
  Administered 2016-06-10 – 2016-06-15 (×6): 75 mg via ORAL
  Filled 2016-06-10 (×6): qty 1

## 2016-06-10 MED ORDER — FLUOXETINE HCL 10 MG PO CAPS
20.0000 mg | ORAL_CAPSULE | Freq: Every day | ORAL | Status: DC
  Administered 2016-06-10 – 2016-06-15 (×6): 20 mg via ORAL
  Filled 2016-06-10: qty 1
  Filled 2016-06-10 (×2): qty 2
  Filled 2016-06-10: qty 1
  Filled 2016-06-10: qty 2
  Filled 2016-06-10: qty 1

## 2016-06-10 MED ORDER — ONDANSETRON HCL 4 MG PO TABS: 4.0000 mg | ORAL_TABLET | Freq: Four times a day (QID) | ORAL | Status: DC | PRN

## 2016-06-10 MED ORDER — HYDROCODONE-ACETAMINOPHEN 5-325 MG PO TABS
0.5000 | ORAL_TABLET | Freq: Every day | ORAL | Status: DC | PRN
  Administered 2016-06-10 – 2016-06-12 (×2): 1 via ORAL
  Filled 2016-06-10 (×3): qty 1

## 2016-06-10 MED ORDER — ALPRAZOLAM 0.5 MG PO TABS
0.5000 mg | ORAL_TABLET | Freq: Two times a day (BID) | ORAL | Status: DC
  Administered 2016-06-10 – 2016-06-15 (×11): 0.5 mg via ORAL
  Filled 2016-06-10 (×11): qty 1

## 2016-06-10 MED ORDER — FUROSEMIDE 10 MG/ML IJ SOLN
20.0000 mg | Freq: Once | INTRAMUSCULAR | Status: AC
  Administered 2016-06-10: 20 mg via INTRAVENOUS
  Filled 2016-06-10: qty 4

## 2016-06-10 MED ORDER — PANTOPRAZOLE SODIUM 40 MG PO TBEC: 40.0000 mg | DELAYED_RELEASE_TABLET | ORAL | Status: DC

## 2016-06-10 MED ORDER — LEVOFLOXACIN 500 MG PO TABS
500.0000 mg | ORAL_TABLET | Freq: Every day | ORAL | Status: DC
  Administered 2016-06-11 – 2016-06-12 (×2): 500 mg via ORAL
  Filled 2016-06-10 (×2): qty 1

## 2016-06-10 MED ORDER — SENNOSIDES-DOCUSATE SODIUM 8.6-50 MG PO TABS
1.0000 | ORAL_TABLET | Freq: Every evening | ORAL | Status: DC | PRN
  Administered 2016-06-11: 1 via ORAL
  Filled 2016-06-10: qty 1

## 2016-06-10 MED ORDER — FUROSEMIDE 10 MG/ML IJ SOLN
20.0000 mg | Freq: Two times a day (BID) | INTRAMUSCULAR | Status: DC
  Administered 2016-06-10: 20 mg via INTRAVENOUS
  Filled 2016-06-10: qty 2

## 2016-06-10 MED ORDER — ONDANSETRON HCL 4 MG/2ML IJ SOLN: 4.0000 mg | Freq: Four times a day (QID) | INTRAMUSCULAR | Status: DC | PRN

## 2016-06-10 MED ORDER — GUAIFENESIN ER 600 MG PO TB12
600.0000 mg | ORAL_TABLET | Freq: Two times a day (BID) | ORAL | Status: DC
  Administered 2016-06-10 – 2016-06-15 (×11): 600 mg via ORAL
  Filled 2016-06-10 (×11): qty 1

## 2016-06-10 MED ORDER — ALBUTEROL SULFATE (2.5 MG/3ML) 0.083% IN NEBU
2.5000 mg | INHALATION_SOLUTION | RESPIRATORY_TRACT | Status: DC
  Administered 2016-06-10 – 2016-06-12 (×15): 2.5 mg via RESPIRATORY_TRACT
  Filled 2016-06-10 (×16): qty 3

## 2016-06-10 MED ORDER — TRAZODONE HCL 50 MG PO TABS
50.0000 mg | ORAL_TABLET | Freq: Every day | ORAL | Status: DC
  Administered 2016-06-10 – 2016-06-12 (×3): 50 mg via ORAL
  Administered 2016-06-13 – 2016-06-14 (×2): 100 mg via ORAL
  Filled 2016-06-10 (×2): qty 1
  Filled 2016-06-10: qty 2
  Filled 2016-06-10 (×2): qty 1
  Filled 2016-06-10: qty 2

## 2016-06-10 MED ORDER — TIZANIDINE HCL 2 MG PO TABS
2.0000 mg | ORAL_TABLET | Freq: Three times a day (TID) | ORAL | Status: DC | PRN
  Administered 2016-06-10 – 2016-06-13 (×4): 2 mg via ORAL
  Filled 2016-06-10 (×4): qty 1

## 2016-06-10 MED ORDER — FAMOTIDINE 20 MG PO TABS
20.0000 mg | ORAL_TABLET | Freq: Two times a day (BID) | ORAL | Status: DC
  Administered 2016-06-10 – 2016-06-15 (×11): 20 mg via ORAL
  Filled 2016-06-10 (×11): qty 1

## 2016-06-10 MED ORDER — METHYLPREDNISOLONE SODIUM SUCC 125 MG IJ SOLR
125.0000 mg | Freq: Once | INTRAMUSCULAR | Status: AC
  Administered 2016-06-10: 125 mg via INTRAVENOUS
  Filled 2016-06-10: qty 2

## 2016-06-10 MED ORDER — ALBUTEROL SULFATE (2.5 MG/3ML) 0.083% IN NEBU: 2.5000 mg | INHALATION_SOLUTION | RESPIRATORY_TRACT | Status: DC | PRN

## 2016-06-10 MED ORDER — SODIUM CHLORIDE 0.9 % IV SOLN: 250.0000 mL | INTRAVENOUS | Status: DC | PRN

## 2016-06-10 MED ORDER — SODIUM CHLORIDE 0.9% FLUSH
3.0000 mL | Freq: Two times a day (BID) | INTRAVENOUS | Status: DC
  Administered 2016-06-10 – 2016-06-15 (×10): 3 mL via INTRAVENOUS

## 2016-06-10 MED ORDER — SODIUM CHLORIDE 0.9% FLUSH
3.0000 mL | INTRAVENOUS | Status: DC | PRN
  Administered 2016-06-13: 3 mL via INTRAVENOUS
  Filled 2016-06-10: qty 3

## 2016-06-10 MED ORDER — LEVOFLOXACIN 750 MG PO TABS: 750.0000 mg | ORAL_TABLET | Freq: Every day | ORAL | Status: DC

## 2016-06-10 MED ORDER — ATORVASTATIN CALCIUM 10 MG PO TABS
10.0000 mg | ORAL_TABLET | Freq: Every evening | ORAL | Status: DC
  Administered 2016-06-10 – 2016-06-14 (×5): 10 mg via ORAL
  Filled 2016-06-10 (×5): qty 1

## 2016-06-10 MED ORDER — SODIUM CHLORIDE 0.9% FLUSH
3.0000 mL | Freq: Two times a day (BID) | INTRAVENOUS | Status: DC
  Administered 2016-06-10 – 2016-06-13 (×5): 3 mL via INTRAVENOUS

## 2016-06-10 MED ORDER — ENOXAPARIN SODIUM 40 MG/0.4ML ~~LOC~~ SOLN
40.0000 mg | SUBCUTANEOUS | Status: DC
  Administered 2016-06-10: 40 mg via SUBCUTANEOUS
  Filled 2016-06-10: qty 0.4

## 2016-06-10 MED ORDER — DEXTROSE 5 % IV SOLN
1.0000 g | Freq: Once | INTRAVENOUS | Status: AC
  Administered 2016-06-10: 1 g via INTRAVENOUS
  Filled 2016-06-10: qty 10

## 2016-06-10 MED ORDER — POTASSIUM CHLORIDE CRYS ER 20 MEQ PO TBCR
20.0000 meq | EXTENDED_RELEASE_TABLET | Freq: Every day | ORAL | Status: DC
  Administered 2016-06-10 – 2016-06-11 (×2): 20 meq via ORAL
  Filled 2016-06-10 (×2): qty 1

## 2016-06-10 MED ORDER — LATANOPROST 0.005 % OP SOLN
1.0000 [drp] | Freq: Every day | OPHTHALMIC | Status: DC
  Administered 2016-06-10 – 2016-06-14 (×5): 1 [drp] via OPHTHALMIC
  Filled 2016-06-10 (×2): qty 2.5

## 2016-06-10 NOTE — ED Provider Notes (Signed)
-----------------------------------------   9:10 AM on 06/10/2016 -----------------------------------------  Care was assumed from Dr. Karma Greaser at approximately 7 AM. Briefly this is an 82 year old female presenting with acute hypoxic respiratory failure thought to be secondary to COPD exacerbation. She received multiple DuoNebs as well as Solu-Medrol, and appears to be improving  on BiPAP, able to speak over the BiPAP and appears  comfortable at this time. Chest x-ray is also concerning for volume overload, I ordered Lasix. Additionally, I ordered ceftriaxone and azithromycin in the event that there is underlying pneumonia that were not able to visualize at this time. Her white blood cell count is elevated at 16,000, lactic acid is also mildly elevated at 2.3 however she is mentating appropriately and maintaining adequate blood pressure, I will not give fluids as she is already volume overloaded interstitial edema. Blood cultures were sent prior to antibiotic administration. Case discussed with the hospitalist for admission this time.   Joanne Gavel, MD 06/10/16 [phone]

## 2016-06-10 NOTE — H&P (Signed)
[facility] NAME: Debra [name]    MR#:  [id number]  DATE OF BIRTH:  [birth year]  DATE OF ADMISSION:  06/10/2016  PRIMARY CARE PHYSICIAN: Volanda Napoleon, MD   REQUESTING/REFERRING PHYSICIAN:   CHIEF COMPLAINT:   Chief Complaint  Patient presents with  . Respiratory Distress    Pt. here from home with breathing difficulty.     HISTORY OF PRESENT ILLNESS: Debra [name]  is a 82 y.o. female with a known history of COPD, respiratory failure, on to receive oxygen through nasal cannula at home, asthma, coronary artery disease, who presented to the hospital with complaints of shortness of breath, going on for the past 3 or 4 days, accompanied by yellow to green phlegm, dyspnea on exertion. On arrival to the hospital the patient was hypoxic, O2 sats were 83% on 3 L of oxygen, she was placed on nonrebreather initially. Then on BiPAP. Chest x-ray revealed mild congestive heart failure superimposed on underlying emphysema. Patient complained of subjective fever. She was started on antibiotic therapy with Rocephin and Zithromax, given steroids, nebulizer therapy, Lasix, she improved on BiPAP and feels much more comfortable, unable to get review of systems due to being on BiPAP and being still very short of breath   PAST MEDICAL HISTORY:   Past Medical History:  Diagnosis Date  . Asthma   . COPD (chronic obstructive pulmonary disease) ([place])   . Myocardial infarction     PAST SURGICAL HISTORY: Past Surgical History:  Procedure Laterality Date  . ABDOMINAL HYSTERECTOMY    . BREAST SURGERY    . BYPASS GRAFT  2007   triple  . CHOLECYSTECTOMY    . TONSILLECTOMY      SOCIAL HISTORY:  Social History  Substance Use Topics  . Smoking status: Current Some Day Smoker  . Smokeless tobacco: Never Used  . Alcohol use No    FAMILY HISTORY:  Family History  Problem Relation Age of Onset  . CAD Father     DRUG ALLERGIES:   Allergies  Allergen Reactions  . Codeine Itching and Rash    Review of Systems  Unable to perform ROS: Critical illness    MEDICATIONS AT HOME:  Prior to Admission medications   Medication Sig Start Date End Date Taking? Authorizing Provider  albuterol (PROVENTIL HFA;VENTOLIN HFA) 108 (90 Base) MCG/ACT inhaler Inhale 2 puffs into the lungs every 6 (six) hours as needed for wheezing or shortness of breath.   Yes Historical Provider, MD  albuterol (PROVENTIL) (2.5 MG/3ML) 0.083% nebulizer solution Take 2.5 mg by nebulization 4 (four) times daily as needed for wheezing or shortness of breath.   Yes Historical Provider, MD  ALPRAZolam Duanne Moron) 0.5 MG tablet Take 0.5 mg by mouth 2 (two) times daily.    Yes Historical Provider, MD  aspirin 81 MG chewable tablet Chew by mouth every morning.   Yes Historical Provider, MD  atorvastatin (LIPITOR) 10 MG tablet Take 10 mg by mouth every evening. 05/16/16  Yes Historical Provider, MD  clopidogrel (PLAVIX) 75 MG tablet Take 75 mg by mouth every morning.    Yes Historical Provider, MD  diclofenac sodium (VOLTAREN) 1 % GEL Apply 2 g topically 2 (two) times daily as needed (for pain).   Yes Historical Provider, MD  FLUoxetine (PROZAC) 20 MG capsule Take 1 capsule (20 mg total) by mouth daily. 09/01/15  Yes Aldean Jewett, MD  HYDROcodone-acetaminophen (NORCO/VICODIN) 5-325 MG tablet Take 0.5-1 tablets by mouth  daily as needed for moderate pain.    Yes Historical Provider, MD  latanoprost (XALATAN) 0.005 % ophthalmic solution Place 1 drop into both eyes at bedtime. 05/10/16  Yes Historical Provider, MD  losartan (COZAAR) 50 MG tablet Take 1 tablet (50 mg total) by mouth daily. Patient taking differently: Take 50 mg by mouth every morning.  09/01/15  Yes Aldean Jewett, MD  mometasone-formoterol (DULERA) 100-5 MCG/ACT AERO Inhale 2 puffs into the lungs 2 (two) times daily. 09/01/15  Yes Aldean Jewett, MD  pantoprazole (PROTONIX) 40 MG tablet Take 40 mg  by mouth every morning.    Yes Historical Provider, MD  tiotropium (SPIRIVA HANDIHALER) 18 MCG inhalation capsule Place 1 capsule (18 mcg total) into inhaler and inhale every morning. 09/01/15  Yes Aldean Jewett, MD  tiZANidine (ZANAFLEX) 2 MG tablet Take 2 mg by mouth 3 (three) times daily as needed for muscle spasms.    Yes Historical Provider, MD  traZODone (DESYREL) 50 MG tablet Take 50-100 mg by mouth at bedtime.    Yes Historical Provider, MD  azithromycin (ZITHROMAX) 500 MG tablet Take 1 tablet (500 mg total) by mouth daily. 09/16/15   Epifanio Lesches, MD  hydrochlorothiazide (HYDRODIURIL) 25 MG tablet Take 1 tablet (25 mg total) by mouth daily. 09/01/15   Aldean Jewett, MD  predniSONE (DELTASONE) 20 MG tablet Take 1 tablet (20 mg total) by mouth daily with breakfast. Patient not taking: Reported on 06/10/2016 09/01/15   Aldean Jewett, MD      PHYSICAL EXAMINATION:   VITAL SIGNS: Blood pressure (!) 150/83, pulse 85, temperature 98.5 F (36.9 C), temperature source Axillary, resp. rate (!) 25, height '5\' 1"'$  (1.549 m), weight 75.1 kg (165 lb 9.1 oz), SpO2 95 %.  GENERAL:  82 y.o.-year-old patient lying in the bed in the moderate respiratory distress, using BiPAP, tachypneic, dyspneic, uncomfortable.  EYES: Pupils equal, round, reactive to light and accommodation. No scleral icterus. Extraocular muscles intact.  HEENT: Head atraumatic, normocephalic. Oropharynx and nasopharynx clear.  NECK:  Supple, no jugular venous distention. No thyroid enlargement, no tenderness.  LUNGS:  Diminished breath sounds bilaterally, no wheezing, rales,rhonchi or crepitation. . Using accessory  muscles of respiration, very uncomfortable .  CARDIOVASCULAR: S1, S2  tachycardic, regular . No murmurs, rubs, or gallops.  ABDOMEN: Soft, nontender, nondistended. Bowel sounds present. No organomegaly or mass.  EXTREMITIES: No pedal edema, cyanosis, or clubbing.  NEUROLOGIC: Cranial nerves II through XII  are intact. Muscle strength 5/5 in all extremities. Sensation intact. Gait not checked.  PSYCHIATRIC: The patient is alert and oriented x 3.  SKIN: No obvious rash, lesion, or ulcer.   LABORATORY PANEL:   CBC  Recent Labs Lab 06/10/16 0706  WBC 16.2*  HGB 13.6  HCT 40.5  PLT 239  MCV 103.5*  MCH 34.6*  MCHC 33.4  RDW 14.2  LYMPHSABS 0.7*  MONOABS 0.7  EOSABS 0.0  BASOSABS 0.0   ------------------------------------------------------------------------------------------------------------------  Chemistries   Recent Labs Lab 06/10/16 0832  NA 135  K 4.1  CL 98*  CO2 30  GLUCOSE 126*  BUN 14  CREATININE 0.90  CALCIUM 8.5*  AST 23  ALT 17  ALKPHOS 49  BILITOT 0.9   ------------------------------------------------------------------------------------------------------------------  Cardiac Enzymes  Recent Labs Lab 06/10/16 0832  TROPONINI <0.03   ------------------------------------------------------------------------------------------------------------------  RADIOLOGY: Dg Chest Portable 1 View  Result Date: 06/10/2016 CLINICAL DATA:  82 year old female with history of difficulty breathing yesterday evening. Low oxygen saturation. EXAM: PORTABLE CHEST 1 VIEW  COMPARISON:  Chest x-ray 09/24/2015. FINDINGS: Diffuse peribronchial cuffing and interstitial prominence, increased compared to prior examinations. No confluent consolidative airspace disease. Emphysematous changes, most apparent in the upper lungs bilaterally. Cephalization of the pulmonary vasculature. Mild cardiomegaly. The patient is rotated to the left on today's exam, resulting in distortion of the mediastinal contours and reduced diagnostic sensitivity and specificity for mediastinal pathology. Atherosclerosis in the thoracic aorta. Status post median sternotomy for CABG. IMPRESSION: 1. The appearance the chest is most suggestive of mild congestive heart failure, superimposed upon a background of  underlying emphysema. 2. Aortic atherosclerosis. Electronically Signed   By: Vinnie Langton M.D.   On: 06/10/2016 07:37    EKG: Orders placed or performed during the hospital encounter of 08/26/15  . EKG 12-Lead  . EKG 12-Lead  . ED EKG  . ED EKG  . EKG   EKG in the emergency room reveals sinus tachycardia at 10 1 bpm, normal axis, low voltage QRS in precordial leads. Baseline wandering in leads 123, aVL, aVF and V1    IMPRESSION AND PLAN:  Active Problems:   Acute respiratory failure with hypoxia and hypercapnia (HCC)   Acute bronchitis   Acute CHF (HCC)   Acute respiratory failure with hypoxia (HCC) #1 Acute on chronic respiratory failure with hypoxia and hypercapnia due to COPD exacerbation and acute CHF, admit patient to intensive care unit, continue BiPAP, oxygen therapy as needed, wean off BiPAP as tolerated, pulmonary consultation is requested #2. COPD exacerbation, initiate patient on steroids, nebulizing therapy, inhalers, antibiotics, follow clinically  #3. Acute bronchitis, continue patient on levofloxacin, get sputum cultures if possible, adjust antibiotics depending on culture results   #4 acute CHF, likely diastolic, echocardiogram is pending, continue patient on Lasix intravenously, following in's and outs and weight #5. Hyperglycemia, get hemoglobin A1c #6. Lactic acidosis, likely due to severe hypoxia, follow lactic acid level closely #7. Leukocytosis, likely stress related, as well as due to infection, continue levofloxacin, get sputum cultures   All the records are reviewed and case discussed with ED provider. Management plans discussed with the patient, family and they are in agreement.  CODE STATUS:    Code Status Orders        Start     Ordered   06/10/16 1032  Full code  Continuous     06/10/16 1032    Code Status History    Date Active Date Inactive Code Status Order ID Comments User Context   06/10/2016 10:32 AM 06/10/2016 10:50 AM Full Code  [id number]  Theodoro Grist, MD Inpatient   09/14/2015  8:19 PM 09/16/2015  8:34 PM Full Code [id number]  Theodoro Grist, MD Inpatient    Critical care care TIME TAKING CARE OF THIS PATIENT: 60 minutes.    Theodoro Grist M.D on 06/10/2016 at 11:03 AM  Between 7am to 6pm - Pager - [phone] After 6pm go to [email or web address] - password [email or web address] [facility]  Office  [phone]  CC: Primary care physician; Volanda Napoleon, MD

## 2016-06-10 NOTE — ED Triage Notes (Signed)
Pt. States cough and congestion for the past 3 nights.  Pt. Is COPD.  Pt. States clear productive sputum.  Pt. States today yellow productive sputum.

## 2016-06-10 NOTE — ED Provider Notes (Signed)
[facility] Emergency Department Provider Note  ____________________________________________   First MD Initiated Contact with Patient 06/10/16 [phone]     (approximate)  I have reviewed the triage vital signs and the nursing notes.   HISTORY  Chief Complaint Respiratory Distress (Pt. here from home with breathing difficulty. )    HPI Debra [name] is a 82 y.o. female with a history of COPD on 2 L of oxygen at baseline who presents by private vehicle in severe respiratory distress.  She reports that for the last 2-3 days she has been gradually getting worse in terms of her shortness of breath at rest.  It is worse with any kind of exertion.  She has had an increasingly severe cough productive of white and yellow sputum.  She has had some subjective fever.  She denies nausea, vomiting, chest pain, abdominal pain, dysuria.  Symptoms are getting gradually worse and nothing is making them better.  She has been using her home medications but they have not been helping.  Upon arrival in the emergency department he had turned up her oxygen to 3 L and was satting 83% and she was placed on a nonrebreather and brought directly back to her room.   Past Medical History:  Diagnosis Date  . Asthma   . COPD (chronic obstructive pulmonary disease) ([place])   . Myocardial infarction     Patient Active Problem List   Diagnosis Date Noted  . Acute on chronic renal failure ([place]) 09/14/2015  . Acidosis 09/14/2015  . Hypotension 09/14/2015  . Hypokalemia 09/14/2015  . COPD exacerbation ([place]) 09/01/2015  . Sepsis ([place]) 08/27/2015    Past Surgical History:  Procedure Laterality Date  . ABDOMINAL HYSTERECTOMY    . BREAST SURGERY    . BYPASS GRAFT  2007   triple  . CHOLECYSTECTOMY    . TONSILLECTOMY      Prior to Admission medications   Medication Sig Start Date End Date Taking? Authorizing Provider  albuterol (PROVENTIL HFA;VENTOLIN HFA) 108 (90 Base) MCG/ACT inhaler  Inhale 2 puffs into the lungs every 6 (six) hours as needed for wheezing or shortness of breath.    Historical Provider, MD  albuterol (PROVENTIL) (2.5 MG/3ML) 0.083% nebulizer solution Take 2.5 mg by nebulization 4 (four) times daily as needed for wheezing or shortness of breath.    Historical Provider, MD  ALPRAZolam Duanne Moron) 0.5 MG tablet Take 0.5 mg by mouth 2 (two) times daily.     Historical Provider, MD  azithromycin (ZITHROMAX) 500 MG tablet Take 1 tablet (500 mg total) by mouth daily. 09/16/15   Epifanio Lesches, MD  clopidogrel (PLAVIX) 75 MG tablet Take 75 mg by mouth daily.     Historical Provider, MD  diclofenac sodium (VOLTAREN) 1 % GEL Apply 2 g topically 2 (two) times daily as needed (for pain).    Historical Provider, MD  FLUoxetine (PROZAC) 20 MG capsule Take 1 capsule (20 mg total) by mouth daily. 09/01/15   Aldean Jewett, MD  hydrochlorothiazide (HYDRODIURIL) 25 MG tablet Take 1 tablet (25 mg total) by mouth daily. 09/01/15   Aldean Jewett, MD  HYDROcodone-acetaminophen (NORCO/VICODIN) 5-325 MG tablet Take 0.5-1 tablets by mouth daily as needed for moderate pain.     Historical Provider, MD  losartan (COZAAR) 50 MG tablet Take 1 tablet (50 mg total) by mouth daily. 09/01/15   Aldean Jewett, MD  mometasone-formoterol (DULERA) 100-5 MCG/ACT AERO Inhale 2 puffs into the lungs 2 (two) times daily. 09/01/15  Aldean Jewett, MD  pantoprazole (PROTONIX) 40 MG tablet Take 40 mg by mouth daily.     Historical Provider, MD  predniSONE (DELTASONE) 20 MG tablet Take 1 tablet (20 mg total) by mouth daily with breakfast. Patient not taking: Reported on 09/14/2015 09/01/15   Aldean Jewett, MD  tiotropium (SPIRIVA HANDIHALER) 18 MCG inhalation capsule Place 1 capsule (18 mcg total) into inhaler and inhale every morning. 09/01/15   Aldean Jewett, MD  tiZANidine (ZANAFLEX) 2 MG tablet Take 2 mg by mouth 3 (three) times daily as needed for muscle spasms.     Historical Provider,  MD  traZODone (DESYREL) 50 MG tablet Take 50-100 mg by mouth at bedtime as needed for sleep.     Historical Provider, MD    Allergies Aspirin; Penicillins; and Codeine  Family History  Problem Relation Age of Onset  . CAD Father     Social History Social History  Substance Use Topics  . Smoking status: Current Some Day Smoker  . Smokeless tobacco: Never Used  . Alcohol use No    Review of Systems Constitutional: Subjective fever Eyes: No visual changes. ENT: No sore throat. Cardiovascular: Denies chest pain. Respiratory: Shortness of breath and productive cough Gastrointestinal: No abdominal pain.  No nausea, no vomiting.  No diarrhea.  No constipation. Genitourinary: Negative for dysuria. Musculoskeletal: Negative for back pain. Skin: Negative for rash. Neurological: Negative for headaches, focal weakness or numbness.  10-point ROS otherwise negative.  ____________________________________________   PHYSICAL EXAM:  VITAL SIGNS: ED Triage Vitals  Enc Vitals Group     BP 06/10/16 0643 (!) 163/81     Pulse Rate 06/10/16 0643 99     Resp 06/10/16 0643 (!) 29     Temp --      Temp src --      SpO2 06/10/16 0639 (!) 83 %     Weight 06/10/16 0639 170 lb (77.1 kg)     Height 06/10/16 0639 '5\' 1"'$  (1.549 m)     Head Circumference --      Peak Flow --      Pain Score 06/10/16 0639 5     Pain Loc --      Pain Edu? --      Excl. in [place]? --     Constitutional: Alert and oriented. Moderate respiratory distress, ill but nontoxic appearance Eyes: Conjunctivae are normal. PERRL. EOMI. Head: Atraumatic. Nose: No congestion/rhinnorhea. Mouth/Throat: Mucous membranes are moist.  Oropharynx non-erythematous. Neck: No stridor.  No meningeal signs.   Cardiovascular: Tachycardia, regular rhythm. Good peripheral circulation. Grossly normal heart sounds. Respiratory: Increased respiratory effort with sternal retractions and nasal flaring.  Tight breath sounds throughout, minimal  wheezing but minimal air movement.  Coarse sounds in bases with frequent cough. Gastrointestinal: Soft and nontender. No distention.  Musculoskeletal: No lower extremity tenderness nor edema. No gross deformities of extremities. Neurologic:  Normal speech and language. No gross focal neurologic deficits are appreciated.  Skin:  Skin is pale, warm, dry and intact. No rash noted. Psychiatric: Mood and affect are normal. Speech and behavior are normal.  ____________________________________________   LABS (all labs ordered are listed, but only abnormal results are displayed)  Labs Reviewed  CULTURE, BLOOD (ROUTINE X 2)  CULTURE, BLOOD (ROUTINE X 2)  CBC WITH DIFFERENTIAL/PLATELET  COMPREHENSIVE METABOLIC PANEL  TROPONIN I  LACTIC ACID, PLASMA  LACTIC ACID, PLASMA  BLOOD GAS, VENOUS  INFLUENZA PANEL BY PCR (TYPE A & B, H1N1)   ____________________________________________  EKG  ED ECG REPORT I, Cristel Rail, the attending physician, personally viewed and interpreted this ECG.  Date: 06/10/2016 EKG Time: 06:43 Rate: 101 Rhythm: Borderline sinus tachycardia QRS Axis: normal Intervals: normal ST/T Wave abnormalities: normal Conduction Disturbances: none Narrative Interpretation: unremarkable  ____________________________________________  RADIOLOGY   No results found.  ____________________________________________   PROCEDURES  Procedure(s) performed:   .Critical Care Performed by: Hinda Kehr Authorized by: Hinda Kehr   Critical care provider statement:    Critical care time (minutes):  30   Critical care time was exclusive of:  Separately billable procedures and treating other patients   Critical care was necessary to treat or prevent imminent or life-threatening deterioration of the following conditions:  Respiratory failure   Critical care was time spent personally by me on the following activities:  Development of treatment plan with patient or surrogate,  discussions with consultants, evaluation of patient's response to treatment, examination of patient, obtaining history from patient or surrogate, ordering and performing treatments and interventions, ordering and review of laboratory studies, ordering and review of radiographic studies, pulse oximetry, re-evaluation of patient's condition and review of old charts     Critical Care performed: Yes, see critical care procedure note(s) ____________________________________________   INITIAL IMPRESSION / [place] / ED COURSE  Pertinent labs & imaging results that were available during my care of the patient were reviewed by me and considered in my medical decision making (see chart for details).  The patient is in moderate distress upon arrival and had a oxygen saturation of 83% on 3 L.  On a nonrebreather her oxygenation goes back up to 100% but I believe this may be excessive oxygen therapy and with the patient would benefit from more his BiPAP to address her hypoxemia and the oxygen to be titrated appropriately.  I called respiratory therapist and asked them to come off the BiPAP.  The patient is alert and oriented and although she is in respiratory distress she does not appear to be in danger of losing her airway at this time.  I have ordered empiric COPD treatment and I am also checking a chest x-ray, lactic acid, blood cultures, and VBG.  The nurses are currently working on peripheral IV access.  I will transfer emergency department care to Dr. Edd Fabian to follow up results and admit the patient.  I held off calling code sepsis because I think this is likely a respiratory as opposed to an infectious issue, but this can be called later if appropriate.   ____________________________________________  FINAL CLINICAL IMPRESSION(S) / ED DIAGNOSES  Final diagnoses:  Acute on chronic respiratory failure with hypoxemia (HCC)  COPD exacerbation ([place])     MEDICATIONS GIVEN DURING THIS  VISIT:  Medications  ipratropium-albuterol (DUONEB) 0.5-2.5 (3) MG/3ML nebulizer solution 3 mL (not administered)  ipratropium-albuterol (DUONEB) 0.5-2.5 (3) MG/3ML nebulizer solution 3 mL (not administered)  ipratropium-albuterol (DUONEB) 0.5-2.5 (3) MG/3ML nebulizer solution 3 mL (not administered)  methylPREDNISolone sodium succinate (SOLU-MEDROL) 125 mg/2 mL injection 125 mg (not administered)     NEW OUTPATIENT MEDICATIONS STARTED DURING THIS VISIT:  New Prescriptions   No medications on file    Modified Medications   No medications on file    Discontinued Medications   No medications on file     Note:  This document was prepared using Dragon voice recognition software and may include unintentional dictation errors.    Hinda Kehr, MD 06/10/16 [phone]

## 2016-06-10 NOTE — ED Notes (Signed)
Pt incontinent of urine. Cleaned and new brief applied.

## 2016-06-10 NOTE — Consult Note (Addendum)
[facility] Consultation     ASSESSMENT/PLAN   82 YO Female with AECOPD.   A:AECOPD with acute bronchitis.  CXR films reviewed:  Chronic changes of bronchitis/emphysema, not significantly changed from previous.  --Nicotine abuse.   P:   Continue steroids.  -Continue LABA.  Continue abx for bronchitis.  --Attempt to wean off bipap today, will continue bipap qhs and prn while in ICU.   CARDIOVASCULAR A: Essential hypertension.  P:  Continue cozaar.    GI  P:  Gi prophylaxis.    HEMATOLOGIC A: Leukocytosis P:  Likely reactive.   INFECTIOUS A:  Acute bronchitis.  P:    Micro/culture results:  BCx2 -- UC -- Sputum--  Antibiotics: Levaquin 10/1>>  ENDOCRINE A:  Will monitor SSI.   NEUROLOGIC A:  --  MAJOR EVENTS/TEST RESULTS:   Best Practices  DVT Prophylaxis: enoxaparin  GI Prophylaxis: famotidine.    ---------------------------------------  ---------------------------------------   Name: Debra [name] MRN: [id number] DOB: 1933-08-21    ADMISSION DATE:  06/10/2016 CONSULTATION DATE:  06/10/16  REFERRING MD :  Dr. Ether Griffins  CHIEF COMPLAINT:  Dyspnea.    HISTORY OF PRESENT ILLNESS:    The patient is an 82 yo female with history of COPD, CAD, E.coli sepsis, nicotine abuse. She is currently on bipap therefore all history was obtained through the chart and from staff.  She presented with 3-4 days of dyspnea, she was seen in the ER, initial oxygen sat documented as 83% she was placed on NRB and then on bipap, she was given abx and transferred to the ICU for further care.  On eval in the ICU, the patient is awake, and asking to have her bipap taken off, she notes that her breathing feels much better.    PAST MEDICAL HISTORY :  Past Medical History:  Diagnosis Date  . Asthma   . COPD (chronic obstructive pulmonary disease) ([place])   . Myocardial infarction    Past Surgical History:  Procedure Laterality Date  .  ABDOMINAL HYSTERECTOMY    . BREAST SURGERY    . BYPASS GRAFT  2007   triple  . CHOLECYSTECTOMY    . TONSILLECTOMY     Prior to Admission medications   Medication Sig Start Date End Date Taking? Authorizing Provider  albuterol (PROVENTIL HFA;VENTOLIN HFA) 108 (90 Base) MCG/ACT inhaler Inhale 2 puffs into the lungs every 6 (six) hours as needed for wheezing or shortness of breath.   Yes Historical Provider, MD  albuterol (PROVENTIL) (2.5 MG/3ML) 0.083% nebulizer solution Take 2.5 mg by nebulization 4 (four) times daily as needed for wheezing or shortness of breath.   Yes Historical Provider, MD  ALPRAZolam Duanne Moron) 0.5 MG tablet Take 0.5 mg by mouth 2 (two) times daily.    Yes Historical Provider, MD  aspirin 81 MG chewable tablet Chew by mouth every morning.   Yes Historical Provider, MD  atorvastatin (LIPITOR) 10 MG tablet Take 10 mg by mouth every evening. 05/16/16  Yes Historical Provider, MD  clopidogrel (PLAVIX) 75 MG tablet Take 75 mg by mouth every morning.    Yes Historical Provider, MD  diclofenac sodium (VOLTAREN) 1 % GEL Apply 2 g topically 2 (two) times daily as needed (for pain).   Yes Historical Provider, MD  FLUoxetine (PROZAC) 20 MG capsule Take 1 capsule (20 mg total) by mouth daily. 09/01/15  Yes Aldean Jewett, MD  HYDROcodone-acetaminophen (NORCO/VICODIN) 5-325 MG tablet Take 0.5-1 tablets by mouth daily as needed for moderate pain.  Yes Historical Provider, MD  latanoprost (XALATAN) 0.005 % ophthalmic solution Place 1 drop into both eyes at bedtime. 05/10/16  Yes Historical Provider, MD  losartan (COZAAR) 50 MG tablet Take 1 tablet (50 mg total) by mouth daily. Patient taking differently: Take 50 mg by mouth every morning.  09/01/15  Yes Aldean Jewett, MD  mometasone-formoterol (DULERA) 100-5 MCG/ACT AERO Inhale 2 puffs into the lungs 2 (two) times daily. 09/01/15  Yes Aldean Jewett, MD  pantoprazole (PROTONIX) 40 MG tablet Take 40 mg by mouth every morning.    Yes  Historical Provider, MD  tiotropium (SPIRIVA HANDIHALER) 18 MCG inhalation capsule Place 1 capsule (18 mcg total) into inhaler and inhale every morning. 09/01/15  Yes Aldean Jewett, MD  tiZANidine (ZANAFLEX) 2 MG tablet Take 2 mg by mouth 3 (three) times daily as needed for muscle spasms.    Yes Historical Provider, MD  traZODone (DESYREL) 50 MG tablet Take 50-100 mg by mouth at bedtime.    Yes Historical Provider, MD  azithromycin (ZITHROMAX) 500 MG tablet Take 1 tablet (500 mg total) by mouth daily. 09/16/15   Epifanio Lesches, MD  hydrochlorothiazide (HYDRODIURIL) 25 MG tablet Take 1 tablet (25 mg total) by mouth daily. 09/01/15   Aldean Jewett, MD  predniSONE (DELTASONE) 20 MG tablet Take 1 tablet (20 mg total) by mouth daily with breakfast. Patient not taking: Reported on 06/10/2016 09/01/15   Aldean Jewett, MD   Allergies  Allergen Reactions  . Codeine Itching and Rash    FAMILY HISTORY:  Family History  Problem Relation Age of Onset  . CAD Father    SOCIAL HISTORY:  reports that she has been smoking.  She has never used smokeless tobacco. She reports that she does not drink alcohol or use drugs.  REVIEW OF SYSTEMS:   Could not be obtained due to wearing bipap.    VITAL SIGNS: Temp:  [98 F (36.7 C)-99.1 F (37.3 C)] 98.5 F (36.9 C) (10/01 1034) Pulse Rate:  [85-101] 85 (10/01 1100) Resp:  [16-29] 25 (10/01 1100) BP: (104-163)/(62-84) 150/83 (10/01 1100) SpO2:  [83 %-99 %] 95 % (10/01 1100) Weight:  [165 lb 9.1 oz (75.1 kg)-170 lb (77.1 kg)] 165 lb 9.1 oz (75.1 kg) (10/01 1034) HEMODYNAMICS:   VENTILATOR SETTINGS:   INTAKE / OUTPUT:  Intake/Output Summary (Last 24 hours) at 06/10/16 1131 Last data filed at 06/10/16 0939  Gross per 24 hour  Intake              250 ml  Output                0 ml  Net              250 ml    Physical Examination:   VS: BP (!) 150/83   Pulse 85   Temp 98.5 F (36.9 C) (Axillary)   Resp (!) 25   Ht '5\' 1"'$  (1.549 m)    Wt 165 lb 9.1 oz (75.1 kg)   SpO2 95%   BMI 31.28 kg/m   General Appearance: No distress  Neuro:without focal findings, mental status, speech normal,. HEENT: PERRLA, EOM intact, no ptosis,  Pulmonary: normal breath sounds., decreased air entry bilaterally.  CardiovascularNormal S1,S2.  No m/r/g.    Abdomen: Benign, Soft, non-tender, No masses, hepatosplenomegaly, No lymphadenopathy Renal:  No costovertebral tenderness  GU:  Not performed at this time. Endoc: No evident thyromegaly, no signs of acromegaly. Skin:   warm, no rashes,  no ecchymosis  Extremities: normal, no cyanosis, clubbing, no edema, warm with normal capillary refill.    LABS: Reviewed   LABORATORY PANEL:   CBC  Recent Labs Lab 06/10/16 1043  WBC 19.1*  HGB 13.1  HCT 37.9  PLT 199    Chemistries   Recent Labs Lab 06/10/16 0832  NA 135  K 4.1  CL 98*  CO2 30  GLUCOSE 126*  BUN 14  CREATININE 0.90  CALCIUM 8.5*  AST 23  ALT 17  ALKPHOS 49  BILITOT 0.9     Recent Labs Lab 06/10/16 1035  GLUCAP 188*   No results for input(s): PHART, PCO2ART, PO2ART in the last 168 hours.  Recent Labs Lab 06/10/16 0832  AST 23  ALT 17  ALKPHOS 49  BILITOT 0.9  ALBUMIN 4.1    Cardiac Enzymes  Recent Labs Lab 06/10/16 0832  TROPONINI <0.03    RADIOLOGY:  Dg Chest Portable 1 View  Result Date: 06/10/2016 CLINICAL DATA:  82 year old female with history of difficulty breathing yesterday evening. Low oxygen saturation. EXAM: PORTABLE CHEST 1 VIEW COMPARISON:  Chest x-ray 09/24/2015. FINDINGS: Diffuse peribronchial cuffing and interstitial prominence, increased compared to prior examinations. No confluent consolidative airspace disease. Emphysematous changes, most apparent in the upper lungs bilaterally. Cephalization of the pulmonary vasculature. Mild cardiomegaly. The patient is rotated to the left on today's exam, resulting in distortion of the mediastinal contours and reduced diagnostic  sensitivity and specificity for mediastinal pathology. Atherosclerosis in the thoracic aorta. Status post median sternotomy for CABG. IMPRESSION: 1. The appearance the chest is most suggestive of mild congestive heart failure, superimposed upon a background of underlying emphysema. 2. Aortic atherosclerosis. Electronically Signed   By: Vinnie Langton M.D.   On: 06/10/2016 07:37       --Marda Stalker, MD.  Board Certified in Internal Medicine, Pulmonary Medicine, [place], and Sleep Medicine.  ICU Pager [phone] [facility] Office Number: [phone]  Patricia Pesa, M.D.  Vilinda Boehringer, M.D.  Merton Border, M.D   06/10/2016, 11:31 AM  [place].  I have personally obtained a history, examined the patient, evaluated laboratory and imaging results, formulated the assessment and plan and placed orders. The Patient requires high complexity decision making for assessment and support, frequent evaluation and titration of therapies, application of advanced monitoring technologies and extensive interpretation of multiple databases. The patient has critical illness that could lead imminently to failure of 1 or more organ systems and requires the highest level of physician preparedness to intervene.  Critical Care Time devoted to patient care services described in this note is 35 minutes and is exclusive of time spent in procedures.

## 2016-06-11 LAB — CBC
HCT: 34.3 % — ABNORMAL LOW (ref 35.0–47.0)
HEMOGLOBIN: 11.4 g/dL — AB (ref 12.0–16.0)
MCH: 34.3 pg — AB (ref 26.0–34.0)
MCHC: 33.3 g/dL (ref 32.0–36.0)
MCV: 103 fL — AB (ref 80.0–100.0)
Platelets: 184 10*3/uL (ref 150–440)
RBC: 3.33 MIL/uL — ABNORMAL LOW (ref 3.80–5.20)
RDW: 14.1 % (ref 11.5–14.5)
WBC: 19.7 10*3/uL — ABNORMAL HIGH (ref 3.6–11.0)

## 2016-06-11 LAB — PROCALCITONIN: Procalcitonin: 0.27 ng/mL

## 2016-06-11 LAB — ECHOCARDIOGRAM COMPLETE
HEIGHTINCHES: 61 in
WEIGHTICAEL: 2649.05 [oz_av]

## 2016-06-11 LAB — HEMOGLOBIN A1C
HEMOGLOBIN A1C: 5.5 % (ref 4.8–5.6)
Mean Plasma Glucose: 111 mg/dL

## 2016-06-11 LAB — BASIC METABOLIC PANEL
ANION GAP: 7 (ref 5–15)
BUN: 28 mg/dL — ABNORMAL HIGH (ref 6–20)
CALCIUM: 8.5 mg/dL — AB (ref 8.9–10.3)
CHLORIDE: 99 mmol/L — AB (ref 101–111)
CO2: 30 mmol/L (ref 22–32)
CREATININE: 1.58 mg/dL — AB (ref 0.44–1.00)
GFR calc Af Amer: 34 mL/min — ABNORMAL LOW (ref >60)
GFR calc non Af Amer: 30 mL/min — ABNORMAL LOW (ref >60)
GLUCOSE: 179 mg/dL — AB (ref 65–99)
Potassium: 3.9 mmol/L (ref 3.5–5.1)
Sodium: 136 mmol/L (ref 135–145)

## 2016-06-11 LAB — GLUCOSE, CAPILLARY
GLUCOSE-CAPILLARY: 200 mg/dL — AB (ref 65–99)
Glucose-Capillary: 262 mg/dL — ABNORMAL HIGH (ref 65–99)

## 2016-06-11 LAB — PHOSPHORUS: Phosphorus: 3.1 mg/dL (ref 2.5–4.6)

## 2016-06-11 LAB — MAGNESIUM: MAGNESIUM: 1.8 mg/dL (ref 1.7–2.4)

## 2016-06-11 MED ORDER — SODIUM CHLORIDE 0.9 % IV BOLUS (SEPSIS)
500.0000 mL | Freq: Once | INTRAVENOUS | Status: AC
  Administered 2016-06-11: 500 mL via INTRAVENOUS

## 2016-06-11 MED ORDER — SODIUM CHLORIDE 0.9 % IV SOLN
INTRAVENOUS | Status: AC
  Administered 2016-06-11: 12:00:00 via INTRAVENOUS

## 2016-06-11 MED ORDER — SODIUM CHLORIDE 0.9 % IV SOLN: INTRAVENOUS | Status: DC

## 2016-06-11 MED ORDER — INSULIN ASPART 100 UNIT/ML ~~LOC~~ SOLN
0.0000 [IU] | Freq: Three times a day (TID) | SUBCUTANEOUS | Status: DC
  Administered 2016-06-12 – 2016-06-13 (×5): 1 [IU] via SUBCUTANEOUS
  Administered 2016-06-14: 3 [IU] via SUBCUTANEOUS
  Filled 2016-06-11 (×5): qty 1
  Filled 2016-06-11: qty 3

## 2016-06-11 MED ORDER — ENOXAPARIN SODIUM 30 MG/0.3ML ~~LOC~~ SOLN
30.0000 mg | SUBCUTANEOUS | Status: DC
  Administered 2016-06-11: 30 mg via SUBCUTANEOUS
  Filled 2016-06-11: qty 0.3

## 2016-06-11 MED ORDER — METHYLPREDNISOLONE SODIUM SUCC 40 MG IJ SOLR
40.0000 mg | INTRAMUSCULAR | Status: DC
  Administered 2016-06-11 – 2016-06-14 (×4): 40 mg via INTRAVENOUS
  Filled 2016-06-11 (×4): qty 1

## 2016-06-11 MED ORDER — SODIUM CHLORIDE 0.9 % IV BOLUS (SEPSIS)
250.0000 mL | Freq: Once | INTRAVENOUS | Status: AC
  Administered 2016-06-11: 250 mL via INTRAVENOUS

## 2016-06-11 MED ORDER — INSULIN ASPART 100 UNIT/ML ~~LOC~~ SOLN: 0.0000 [IU] | Freq: Every day | SUBCUTANEOUS | Status: DC

## 2016-06-11 NOTE — Progress Notes (Signed)
Dr. Ether Griffins notified that patient BP has decreased to SBP 80's (MAP still above 65). However, per Dr. Ether Griffins start patient on NS infusion at 50 for 4 hours. Order placed. Dr. Ether Griffins also notified that patient's CBG elevated and no orders for SSI. Wilnette Kales
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
## 2016-06-11 NOTE — Progress Notes (Signed)
[facility] NAME: Debra [name]    MR#:  [id number]  DATE OF BIRTH:  1932/12/17  SUBJECTIVE:  CHIEF COMPLAINT:   Chief Complaint  Patient presents with  . Respiratory Distress    Pt. here from home with breathing difficulty.    Debra [name]  is a 82 y.o. female with a known history of COPD, respiratory failure, on to receive oxygen through nasal cannula at home, asthma, coronary artery disease, who presented to the hospital with complaints of shortness of breath, going on for the past 3 or 4 days, accompanied by yellow to green phlegm, dyspnea on exertion. On arrival to the hospital the patient was hypoxic, O2 sats were 83% on 3 L of oxygen, she was placed on nonrebreather initially. Then on BiPAP. Chest x-ray revealed mild congestive heart failure superimposed on underlying emphysema. Patient complained of subjective fever. She was started on antibiotic therapy with Rocephin and Zithromax, given steroids, nebulizer therapy, Lasix, she improved on BiPAP and feels much more comfortable, admits of green sputum. Just prior to coming to the hospital, subsiding now   Review of Systems  Constitutional: Negative for chills, fever and weight loss.  HENT: Negative for congestion.   Eyes: Negative for blurred vision and double vision.  Respiratory: Positive for cough, sputum production, shortness of breath and wheezing.   Cardiovascular: Negative for chest pain, palpitations, orthopnea, leg swelling and PND.  Gastrointestinal: Negative for abdominal pain, blood in stool, constipation, diarrhea, nausea and vomiting.  Genitourinary: Negative for dysuria, frequency, hematuria and urgency.  Musculoskeletal: Negative for falls.  Neurological: Negative for dizziness, tremors, focal weakness and headaches.  Endo/Heme/Allergies: Does not bruise/bleed easily.  Psychiatric/Behavioral: Negative for depression. The patient does not have insomnia.      VITAL SIGNS: Blood pressure 104/88, pulse 82, temperature 97.4 F (36.3 C), temperature source Oral, resp. rate (!) 22, height '5\' 1"'$  (1.549 m), weight 76.1 kg (167 lb 12.3 oz), SpO2 94 %.  PHYSICAL EXAMINATION:   GENERAL:  82 y.o.-year-old patient lying in the bed in mild respiratory distress, dyspneic, but not uncomfortable. Able to speak very short sentences EYES: Pupils equal, round, reactive to light and accommodation. No scleral icterus. Extraocular muscles intact.  HEENT: Head atraumatic, normocephalic. Oropharynx and nasopharynx clear.  NECK:  Supple, no jugular venous distention. No thyroid enlargement, no tenderness.  LUNGS: Some diminished breath sounds bilaterally, scattered wheezing, no rales,rhonchi or crepitation. Intermittent use of accessory muscles of respiration, with movements or speech.  CARDIOVASCULAR: S1, S2 normal. No murmurs, rubs, or gallops.  ABDOMEN: Soft, nontender, nondistended. Bowel sounds present. No organomegaly or mass.  EXTREMITIES: No pedal edema, cyanosis, or clubbing.  NEUROLOGIC: Cranial nerves II through XII are intact. Muscle strength 5/5 in all extremities. Sensation intact. Gait not checked.  PSYCHIATRIC: The patient is alert and oriented x 3.  SKIN: No obvious rash, lesion, or ulcer.   ORDERS/RESULTS REVIEWED:   CBC  Recent Labs Lab 06/10/16 0706 06/10/16 1043 06/11/16 0454  WBC 16.2* 19.1* 19.7*  HGB 13.6 13.1 11.4*  HCT 40.5 37.9 34.3*  PLT 239 199 184  MCV 103.5* 100.7* 103.0*  MCH 34.6* 34.7* 34.3*  MCHC 33.4 34.5 33.3  RDW 14.2 14.4 14.1  LYMPHSABS 0.7*  --   --   MONOABS 0.7  --   --   EOSABS 0.0  --   --   BASOSABS 0.0  --   --    ------------------------------------------------------------------------------------------------------------------  Chemistries  Recent Labs Lab 06/10/16 0832 06/10/16 1043 06/11/16 0454  NA 135  --  136  K 4.1  --  3.9  CL 98*  --  99*  CO2 30  --  30  GLUCOSE 126*  --  179*  BUN  14  --  28*  CREATININE 0.90 1.02* 1.58*  CALCIUM 8.5*  --  8.5*  MG  --   --  1.8  AST 23  --   --   ALT 17  --   --   ALKPHOS 49  --   --   BILITOT 0.9  --   --    ------------------------------------------------------------------------------------------------------------------ estimated creatinine clearance is 26.1 mL/min (by C-G formula based on SCr of 1.58 mg/dL (H)). ------------------------------------------------------------------------------------------------------------------  Recent Labs  06/10/16 1043  TSH 0.855    Cardiac Enzymes  Recent Labs Lab 06/10/16 1043 06/10/16 1623 06/10/16 2303  TROPONINI <0.03 <0.03 <0.03   ------------------------------------------------------------------------------------------------------------------ Invalid input(s): POCBNP ---------------------------------------------------------------------------------------------------------------  RADIOLOGY: Dg Chest Portable 1 View  Result Date: 06/10/2016 CLINICAL DATA:  82 year old female with history of difficulty breathing yesterday evening. Low oxygen saturation. EXAM: PORTABLE CHEST 1 VIEW COMPARISON:  Chest x-ray 09/24/2015. FINDINGS: Diffuse peribronchial cuffing and interstitial prominence, increased compared to prior examinations. No confluent consolidative airspace disease. Emphysematous changes, most apparent in the upper lungs bilaterally. Cephalization of the pulmonary vasculature. Mild cardiomegaly. The patient is rotated to the left on today's exam, resulting in distortion of the mediastinal contours and reduced diagnostic sensitivity and specificity for mediastinal pathology. Atherosclerosis in the thoracic aorta. Status post median sternotomy for CABG. IMPRESSION: 1. The appearance the chest is most suggestive of mild congestive heart failure, superimposed upon a background of underlying emphysema. 2. Aortic atherosclerosis. Electronically Signed   By: Vinnie Langton M.D.   On:  06/10/2016 07:37    EKG:  Orders placed or performed during the hospital encounter of 08/26/15  . EKG 12-Lead  . EKG 12-Lead  . ED EKG  . ED EKG  . EKG    ASSESSMENT AND PLAN:  Active Problems:   Acute respiratory failure with hypoxia and hypercapnia (HCC)   Acute bronchitis   Acute CHF (HCC)   Acute respiratory failure with hypoxia (HCC)  #1 Acute on chronic respiratory failure with hypoxia and hypercapnia due to COPD exacerbation and less likely acute CHF, now off BiPAP, continue oxygen therapy , keeping pulse oximetry at 88-92, appreciate pulmonary input #2. COPD exacerbation, continue steroids, nebulizing therapy, inhalers, antibiotics, improved clinically  #3. Acute bronchitis, continue patient on levofloxacin, blood cultures are negative so far, get sputum cultures, adjust antibiotics depending on culture results   #4 acute CHF, combined systolic diastolic, echocardiogram revealed ejection fraction of 45%, discontinue Lasix due to acute renal failure, following in's and outs and weight #5. Hyperglycemia, hemoglobin A1c 5.5, no diabetes #6. Lactic acidosis, likely due to severe hypoxia, lactic acid level was high at 2.3, bicarbonate is 30. Today in the morning #7 Leukocytosis, likely stress related, as well as due to infection, continue levofloxacin,  cultures are negative so far, awaiting for sputum cultures  #8 acute renal failure with over diuresis, stop Lasix, follow creatinine in the morning  #9 hypotension, IV fluids for 6 hours, discontinue IV fluids. When blood pressure improves    Management plans discussed with the patient, family and they are in agreement.   DRUG ALLERGIES:  Allergies  Allergen Reactions  . Codeine Itching and Rash    CODE STATUS:     Code Status Orders  Start     Ordered   06/10/16 1032  Full code  Continuous     06/10/16 1032    Code Status History    Date Active Date Inactive Code Status Order ID Comments User Context    06/10/2016 10:32 AM 06/10/2016 10:50 AM Full Code [id number]  Theodoro Grist, MD Inpatient   09/14/2015  8:19 PM 09/16/2015  8:34 PM Full Code [id number]  Theodoro Grist, MD Inpatient   08/27/2015  8:05 AM 09/01/2015  8:42 PM Full Code [id number]  Harrie Foreman, MD Inpatient      TOTAL Critical careTIME TAKING CARE OF THIS [id number] minutes Jery Hollern M.D on 06/11/2016 at 1:19 PM  Between 7am to 6pm - Pager - [phone]  After 6pm go to [email or web address] - password [email or web address] [facility]  Office  [phone]  CC: Primary care physician; Volanda Napoleon, MD

## 2016-06-11 NOTE — Progress Notes (Signed)
82 y/o F on Lovenox 40 mg daily for DVT prophylaxis. Due to BMI < 40 and CrCl < 30 ml/min, will decrease Lovenox dosing to 30 mg q 24 hours.   Ulice Dash, PharmD Clinical Pharmacist

## 2016-06-11 NOTE — Clinical Social Work Note (Signed)
CSW attempted to see patient this afternoon in ICU but patient was snoring and did not awaken when name called and arm lightly touched. CSW will return to see patient. Shela Leff MSW,LCSW [phone]

## 2016-06-11 NOTE — Progress Notes (Signed)
Patient SBP in 60's while Dr. Ether Griffins present, NS  bolus ordered and currently infusing. Patient shows no symptoms of hypotension at this time, alert and oriented x4. Will continue to assess. Wilnette Kales

## 2016-06-11 NOTE — Consult Note (Signed)
[facility] Consultation     ASSESSMENT/PLAN   82 YO Female with AECOPD.   A:AECOPD with acute bronchitis.  CXR films reviewed:  Chronic changes of bronchitis/emphysema, not significantly changed from previous.  --Nicotine abuse.   P:   Continue steroids.  -Continue LABA.  Continue abx for bronchitis.  --Attempt to wean off bipap today, will continue bipap qhs and prn while in ICU.   CARDIOVASCULAR A: Essential hypertension.  P:  Continue cozaar.   GI  P:  Gi prophylaxis.    HEMATOLOGIC A: Leukocytosis P:  Likely reactive.   INFECTIOUS A:  Acute bronchitis.  P:    Micro/culture results:  BCx2 -- UC -- Sputum--  Antibiotics: Levaquin 10/1>>  ENDOCRINE A:  Will monitor SSI.   NEUROLOGIC A:  --  MAJOR EVENTS/TEST RESULTS:   Best Practices  DVT Prophylaxis: enoxaparin  GI Prophylaxis: famotidine.    ---------------------------------------  ---------------------------------------   Name: Debra [name] MRN: [id number] DOB: [birth year]    ADMISSION DATE:  06/10/2016 CONSULTATION DATE:  06/10/16  REFERRING MD :  Dr. Ether Griffins  CHIEF COMPLAINT:  Dyspnea.    HISTORY OF PRESENT ILLNESS:   resp status better today, weaned off biPAP On minimal oxygen    REVIEW OF SYSTEMS:    Gen:  Denies  fever, sweats, chills weigh loss   HEENT: Denies blurred vision, double vision, ear pain, eye pain, hearing loss, nose bleeds, sore throat  Cardiac:  No dizziness, chest pain or heaviness, chest tightness,edema  Resp:   Denies cough or sputum porduction, shortness of breath,wheezing, hemoptysis,   Other:  All other systems negative    VITAL SIGNS: Temp:  [96.5 F (35.8 C)-98.5 F (36.9 C)] 96.5 F (35.8 C) (10/02 0758) Pulse Rate:  [66-101] 72 (10/02 0758) Resp:  [13-35] 18 (10/02 0758) BP: (81-150)/(42-83) 106/51 (10/02 0700) SpO2:  [91 %-98 %] 96 % (10/02 0758) FiO2 (%):  [30 %] 30 % (10/02 0500) Weight:  [165 lb  9.1 oz (75.1 kg)-167 lb 12.3 oz (76.1 kg)] 167 lb 12.3 oz (76.1 kg) (10/02 0445) HEMODYNAMICS:   VENTILATOR SETTINGS: FiO2 (%):  [30 %] 30 % INTAKE / OUTPUT:  Intake/Output Summary (Last 24 hours) at 06/11/16 0943 Last data filed at 06/10/16 1934  Gross per 24 hour  Intake                0 ml  Output              350 ml  Net             -350 ml    Physical Examination:   VS: BP (!) 106/51   Pulse 72   Temp (!) 96.5 F (35.8 C) (Axillary)   Resp 18   Ht '5\' 1"'$  (1.549 m)   Wt 167 lb 12.3 oz (76.1 kg)   SpO2 96%   BMI 31.70 kg/m   General Appearance: No distress  Neuro:without focal findings, mental status, speech normal,. HEENT: PERRLA, EOM intact, no ptosis,  Pulmonary: normal breath sounds., decreased air entry bilaterally.  CardiovascularNormal S1,S2.  No m/r/g.    Abdomen: Benign, Soft, non-tender, No masses, hepatosplenomegaly, No lymphadenopathy Renal:  No costovertebral tenderness  GU:  Not performed at this time. Endoc: No evident thyromegaly, no signs of acromegaly. Skin:   warm, no rashes, no ecchymosis  Extremities: normal, no cyanosis, clubbing, no edema, warm with normal capillary refill.    LABS: Reviewed   LABORATORY PANEL:   CBC  Recent Labs Lab 06/11/16 0454  WBC 19.7*  HGB 11.4*  HCT 34.3*  PLT 184    Chemistries   Recent Labs Lab 06/10/16 0832  06/11/16 0454  NA 135  --  136  K 4.1  --  3.9  CL 98*  --  99*  CO2 30  --  30  GLUCOSE 126*  --  179*  BUN 14  --  28*  CREATININE 0.90  < > 1.58*  CALCIUM 8.5*  --  8.5*  MG  --   --  1.8  PHOS  --   --  3.1  AST 23  --   --   ALT 17  --   --   ALKPHOS 49  --   --   BILITOT 0.9  --   --   < > = values in this interval not displayed.   Recent Labs Lab 06/10/16 1035  GLUCAP 188*   No results for input(s): PHART, PCO2ART, PO2ART in the last 168 hours.  Recent Labs Lab 06/10/16 0832  AST 23  ALT 17  ALKPHOS 49  BILITOT 0.9  ALBUMIN 4.1    Cardiac Enzymes  Recent  Labs Lab 06/10/16 2303  TROPONINI <0.03    RADIOLOGY:  Dg Chest Portable 1 View  Result Date: 06/10/2016 CLINICAL DATA:  82 year old female with history of difficulty breathing yesterday evening. Low oxygen saturation. EXAM: PORTABLE CHEST 1 VIEW COMPARISON:  Chest x-ray 09/24/2015. FINDINGS: Diffuse peribronchial cuffing and interstitial prominence, increased compared to prior examinations. No confluent consolidative airspace disease. Emphysematous changes, most apparent in the upper lungs bilaterally. Cephalization of the pulmonary vasculature. Mild cardiomegaly. The patient is rotated to the left on today's exam, resulting in distortion of the mediastinal contours and reduced diagnostic sensitivity and specificity for mediastinal pathology. Atherosclerosis in the thoracic aorta. Status post median sternotomy for CABG. IMPRESSION: 1. The appearance the chest is most suggestive of mild congestive heart failure, superimposed upon a background of underlying emphysema. 2. Aortic atherosclerosis. Electronically Signed   By: Vinnie Langton M.D.   On: 06/10/2016 07:37      The Patient requires high complexity decision making for assessment and support, frequent evaluation and titration of therapies.   Patient  satisfied with Plan of action and management. All questions answered  Corrin Parker, M.D.  [facility]  Medical Director [place] [facility] Cardio-Pulmonary Department

## 2016-06-11 NOTE — Progress Notes (Signed)
Dr. [name] notified of patient's elevated CBG and need for possible SSI. Stated that he will place order. Debra [name]

## 2016-06-12 DIAGNOSIS — J9602 Acute respiratory failure with hypercapnia: Secondary | ICD-10-CM

## 2016-06-12 LAB — BASIC METABOLIC PANEL
ANION GAP: 3 — AB (ref 5–15)
BUN: 33 mg/dL — ABNORMAL HIGH (ref 6–20)
CALCIUM: 8.9 mg/dL (ref 8.9–10.3)
CHLORIDE: 102 mmol/L (ref 101–111)
CO2: 33 mmol/L — AB (ref 22–32)
Creatinine, Ser: 1.14 mg/dL — ABNORMAL HIGH (ref 0.44–1.00)
GFR calc Af Amer: 51 mL/min — ABNORMAL LOW (ref >60)
GFR calc non Af Amer: 44 mL/min — ABNORMAL LOW (ref >60)
GLUCOSE: 130 mg/dL — AB (ref 65–99)
Potassium: 5.5 mmol/L — ABNORMAL HIGH (ref 3.5–5.1)
Sodium: 138 mmol/L (ref 135–145)

## 2016-06-12 LAB — GLUCOSE, CAPILLARY
GLUCOSE-CAPILLARY: 129 mg/dL — AB (ref 65–99)
GLUCOSE-CAPILLARY: 147 mg/dL — AB (ref 65–99)
GLUCOSE-CAPILLARY: 136 mg/dL — AB (ref 65–99)
Glucose-Capillary: 129 mg/dL — ABNORMAL HIGH (ref 65–99)
Glucose-Capillary: 130 mg/dL — ABNORMAL HIGH (ref 65–99)

## 2016-06-12 LAB — CBC
HCT: 33.6 % — ABNORMAL LOW (ref 35.0–47.0)
HEMOGLOBIN: 11.4 g/dL — AB (ref 12.0–16.0)
MCH: 34.8 pg — AB (ref 26.0–34.0)
MCHC: 33.9 g/dL (ref 32.0–36.0)
MCV: 102.8 fL — AB (ref 80.0–100.0)
Platelets: 217 10*3/uL (ref 150–440)
RBC: 3.26 MIL/uL — AB (ref 3.80–5.20)
RDW: 14.1 % (ref 11.5–14.5)
WBC: 17.6 10*3/uL — ABNORMAL HIGH (ref 3.6–11.0)

## 2016-06-12 LAB — PROCALCITONIN: Procalcitonin: 0.1 ng/mL

## 2016-06-12 LAB — POTASSIUM: POTASSIUM: 4.1 mmol/L (ref 3.5–5.1)

## 2016-06-12 MED ORDER — ENOXAPARIN SODIUM 40 MG/0.4ML ~~LOC~~ SOLN
40.0000 mg | SUBCUTANEOUS | Status: DC
  Administered 2016-06-12 – 2016-06-14 (×3): 40 mg via SUBCUTANEOUS
  Filled 2016-06-12 (×3): qty 0.4

## 2016-06-12 MED ORDER — SODIUM POLYSTYRENE SULFONATE 15 GM/60ML PO SUSP: 30.0000 g | Freq: Once | ORAL | Status: DC

## 2016-06-12 MED ORDER — PHENOL 1.4 % MT LIQD
1.0000 | OROMUCOSAL | Status: DC | PRN
  Administered 2016-06-12: 1 via OROMUCOSAL
  Filled 2016-06-12: qty 177

## 2016-06-12 MED ORDER — LEVOFLOXACIN 500 MG PO TABS
250.0000 mg | ORAL_TABLET | Freq: Every day | ORAL | Status: DC
  Administered 2016-06-13: 250 mg via ORAL
  Filled 2016-06-12: qty 1

## 2016-06-12 MED ORDER — INSULIN ASPART 100 UNIT/ML IV SOLN
10.0000 [IU] | Freq: Once | INTRAVENOUS | Status: DC
  Filled 2016-06-12: qty 0.1

## 2016-06-12 MED ORDER — DEXTROSE 50 % IV SOLN: 1.0000 | Freq: Once | INTRAVENOUS | Status: DC

## 2016-06-12 MED ORDER — ALBUTEROL SULFATE (2.5 MG/3ML) 0.083% IN NEBU
2.5000 mg | INHALATION_SOLUTION | Freq: Four times a day (QID) | RESPIRATORY_TRACT | Status: DC
  Administered 2016-06-13 – 2016-06-14 (×8): 2.5 mg via RESPIRATORY_TRACT
  Filled 2016-06-12 (×8): qty 3

## 2016-06-12 NOTE — Progress Notes (Signed)
Patient alert and oriented. On 2 liters chronically, no complaints of shortness of breath or pain. Tolerating diet and using bedpan. PT evaluation pending.

## 2016-06-12 NOTE — Progress Notes (Addendum)
Initial Heart Failure Clinic appointment scheduled for June 28, 2016 at 9:00am. Thank you

## 2016-06-12 NOTE — Consult Note (Signed)
[facility] Consultation     ASSESSMENT/PLAN   82 YO Female with AECOPD.   A:AECOPD with acute bronchitis.  CXR films reviewed:  Chronic changes of bronchitis/emphysema, not significantly changed from previous.  --Nicotine abuse.   P:   Continue steroids.  -Continue LABA.  Continue abx for bronchitis.  biPAP at night and at nap times  CARDIOVASCULAR A: Essential hypertension.  P:  Continue cozaar.   GI  P:  Gi prophylaxis.    HEMATOLOGIC A: Leukocytosis P:  Likely reactive.   INFECTIOUS A:  Acute bronchitis.   Antibiotics: Levaquin 10/1>>   OK to transfer to gen med floor, Will sign off at this time.  Best Practices  DVT Prophylaxis: enoxaparin  GI Prophylaxis: famotidine.    ---------------------------------------  ---------------------------------------   Name: Debra [name] MRN: [id number] DOB: September 17, 1932    ADMISSION DATE:  06/10/2016 CONSULTATION DATE:  06/10/16  REFERRING MD :  Dr. Ether Griffins  CHIEF COMPLAINT:  Dyspnea.    HISTORY OF PRESENT ILLNESS:   resp status better today, weaned off biPAP On minimal oxygen    REVIEW OF SYSTEMS:    Gen:  Denies  fever, sweats, chills weigh loss   HEENT: Denies blurred vision, double vision, ear pain, eye pain, hearing loss, nose bleeds, sore throat  Cardiac:  No dizziness, chest pain or heaviness, chest tightness,edema  Resp:   Denies cough or sputum porduction, shortness of breath,wheezing, hemoptysis,   Other:  All other systems negative    VITAL SIGNS: Temp:  [96.5 F (35.8 C)-98.5 F (36.9 C)] 98.5 F (36.9 C) (10/03 0400) Pulse Rate:  [66-97] 85 (10/03 0600) Resp:  [15-34] 21 (10/03 0400) BP: (68-148)/(45-88) 107/71 (10/03 0600) SpO2:  [89 %-99 %] 98 % (10/03 0600) FiO2 (%):  [2 %-35 %] 35 % (10/03 0600) HEMODYNAMICS:   VENTILATOR SETTINGS: FiO2 (%):  [2 %-35 %] 35 % INTAKE / OUTPUT:  Intake/Output Summary (Last 24 hours) at 06/12/16 0745 Last  data filed at 06/11/16 2100  Gross per 24 hour  Intake             1270 ml  Output              300 ml  Net              970 ml    Physical Examination:   VS: BP 107/71   Pulse 85   Temp 98.5 F (36.9 C) (Oral)   Resp (!) 21   Ht '5\' 1"'$  (1.549 m)   Wt 167 lb 12.3 oz (76.1 kg)   SpO2 98%   BMI 31.70 kg/m   General Appearance: No distress  Neuro:without focal findings, mental status, speech normal,. HEENT: PERRLA, EOM intact, no ptosis,  Pulmonary: normal breath sounds., decreased air entry bilaterally.  CardiovascularNormal S1,S2.  No m/r/g.    Abdomen: Benign, Soft, non-tender, No masses, hepatosplenomegaly, No lymphadenopathy Renal:  No costovertebral tenderness  GU:  Not performed at this time. Endoc: No evident thyromegaly, no signs of acromegaly. Skin:   warm, no rashes, no ecchymosis  Extremities: normal, no cyanosis, clubbing, no edema, warm with normal capillary refill.    LABS: Reviewed   LABORATORY PANEL:   CBC  Recent Labs Lab 06/12/16 0436  WBC 17.6*  HGB 11.4*  HCT 33.6*  PLT 217    Chemistries   Recent Labs Lab 06/10/16 0832  06/11/16 0454 06/12/16 0436  NA 135  --  136 138  K 4.1  --  3.9 5.5*  CL 98*  --  99* 102  CO2 30  --  30 33*  GLUCOSE 126*  --  179* 130*  BUN 14  --  28* 33*  CREATININE 0.90  < > 1.58* 1.14*  CALCIUM 8.5*  --  8.5* 8.9  MG  --   --  1.8  --   PHOS  --   --  3.1  --   AST 23  --   --   --   ALT 17  --   --   --   ALKPHOS 49  --   --   --   BILITOT 0.9  --   --   --   < > = values in this interval not displayed.   Recent Labs Lab 06/10/16 1035 06/11/16 1057 06/11/16 1940 06/12/16 0724 06/12/16 0728  GLUCAP 188* 262* 200* 129* 129*   No results for input(s): PHART, PCO2ART, PO2ART in the last 168 hours.  Recent Labs Lab 06/10/16 0832  AST 23  ALT 17  ALKPHOS 49  BILITOT 0.9  ALBUMIN 4.1    Cardiac Enzymes  Recent Labs Lab 06/10/16 2303  TROPONINI <0.03    RADIOLOGY:  No results  found.    The Patient requires high complexity decision making for assessment and support, frequent evaluation and titration of therapies.   Patient  satisfied with Plan of action and management. All questions answered Ok to transfer to gen med floor, will sign off at this time  Corrin Parker, M.D.  [facility]  Medical Director [place] [facility] Cardio-Pulmonary Department

## 2016-06-12 NOTE — Progress Notes (Signed)
[facility] [facility] [place] NAME: Debra [name]    MR#:  [id number]  DATE OF BIRTH:  [birth year]  SUBJECTIVE:  CHIEF COMPLAINT:   Chief Complaint  Patient presents with  . Respiratory Distress    Pt. here from home with breathing difficulty.    Debra [name]  is a 82 y.o. female with a known history of COPD, respiratory failure, on to receive oxygen through nasal cannula at home, asthma, coronary artery disease, who presented to the hospital with complaints of shortness of breath, going on for the past 3 or 4 days, accompanied by yellow to green phlegm, dyspnea on exertion. On arrival to the hospital the patient was hypoxic, O2 sats were 83% on 3 L of oxygen, she was placed on nonrebreather initially. Then on BiPAP. Chest x-ray revealed mild congestive heart failure superimposed on underlying emphysema. Patient complained of subjective fever. She was started on antibiotic therapy with Rocephin and Zithromax, given steroids, nebulizer therapy, Lasix, she improved on BiPAP and feels much more comfortable, Less sputum production. Feels significantly improved. She is on 2 L of oxygen through nasal cannula at home, on 4 L here in the hospital. Hypotensive yesterday, requiring IV fluid administration. Blood pressure has improved now as well as kidney function   Review of Systems  Constitutional: Negative for chills, fever and weight loss.  HENT: Negative for congestion.   Eyes: Negative for blurred vision and double vision.  Respiratory: Positive for cough, sputum production, shortness of breath and wheezing.   Cardiovascular: Negative for chest pain, palpitations, orthopnea, leg swelling and PND.  Gastrointestinal: Negative for abdominal pain, blood in stool, constipation, diarrhea, nausea and vomiting.  Genitourinary: Negative for dysuria, frequency, hematuria and urgency.  Musculoskeletal: Negative for falls.  Neurological: Negative for dizziness,  tremors, focal weakness and headaches.  Endo/Heme/Allergies: Does not bruise/bleed easily.  Psychiatric/Behavioral: Negative for depression. The patient does not have insomnia.     VITAL SIGNS: Blood pressure 104/88, pulse 82, temperature 97.4 F (36.3 C), temperature source Oral, resp. rate (!) 22, height '5\' 1"'$  (1.549 m), weight 76.1 kg (167 lb 12.3 oz), SpO2 94 %.  PHYSICAL EXAMINATION:   GENERAL:  82 y.o.-year-old patient lying in the bed in mild respiratory distress, dyspneic and talks \ EYES: Pupils equal, round, reactive to light and accommodation. No scleral icterus. Extraocular muscles intact.  HEENT: Head atraumatic, normocephalic. Oropharynx and nasopharynx clear.  NECK:  Supple, no jugular venous distention. No thyroid enlargement, no tenderness.  LUNGS: Some diminished breath sounds bilaterally, scattered wheezing, no rales,rhonchi or crepitation. Intermittent use of accessory muscles of respiration, with movements or speech.  CARDIOVASCULAR: S1, S2 normal. No murmurs, rubs, or gallops.  ABDOMEN: Soft, nontender, nondistended. Bowel sounds present. No organomegaly or mass.  EXTREMITIES: No pedal edema, cyanosis, or clubbing.  NEUROLOGIC: Cranial nerves II through XII are intact. Muscle strength 5/5 in all extremities. Sensation intact. Gait not checked.  PSYCHIATRIC: The patient is alert and oriented x 3.  SKIN: No obvious rash, lesion, or ulcer.   ORDERS/RESULTS REVIEWED:   CBC  Recent Labs Lab 06/10/16 0706 06/10/16 1043 06/11/16 0454  WBC 16.2* 19.1* 19.7*  HGB 13.6 13.1 11.4*  HCT 40.5 37.9 34.3*  PLT 239 199 184  MCV 103.5* 100.7* 103.0*  MCH 34.6* 34.7* 34.3*  MCHC 33.4 34.5 33.3  RDW 14.2 14.4 14.1  LYMPHSABS 0.7*  --   --   MONOABS 0.7  --   --   EOSABS 0.0  --   --  BASOSABS 0.0  --   --    ------------------------------------------------------------------------------------------------------------------  Chemistries   Recent Labs Lab  06/10/16 0832 06/10/16 1043 06/11/16 0454  NA 135  --  136  K 4.1  --  3.9  CL 98*  --  99*  CO2 30  --  30  GLUCOSE 126*  --  179*  BUN 14  --  28*  CREATININE 0.90 1.02* 1.58*  CALCIUM 8.5*  --  8.5*  MG  --   --  1.8  AST 23  --   --   ALT 17  --   --   ALKPHOS 49  --   --   BILITOT 0.9  --   --    ------------------------------------------------------------------------------------------------------------------ estimated creatinine clearance is 26.1 mL/min (by C-G formula based on SCr of 1.58 mg/dL (H)). ------------------------------------------------------------------------------------------------------------------  Recent Labs  06/10/16 1043  TSH 0.855    Cardiac Enzymes  Recent Labs Lab 06/10/16 1043 06/10/16 1623 06/10/16 2303  TROPONINI <0.03 <0.03 <0.03   ------------------------------------------------------------------------------------------------------------------ Invalid input(s): POCBNP ---------------------------------------------------------------------------------------------------------------  RADIOLOGY: Dg Chest Portable 1 View  Result Date: 06/10/2016 CLINICAL DATA:  82 year old female with history of difficulty breathing yesterday evening. Low oxygen saturation. EXAM: PORTABLE CHEST 1 VIEW COMPARISON:  Chest x-ray 09/24/2015. FINDINGS: Diffuse peribronchial cuffing and interstitial prominence, increased compared to prior examinations. No confluent consolidative airspace disease. Emphysematous changes, most apparent in the upper lungs bilaterally. Cephalization of the pulmonary vasculature. Mild cardiomegaly. The patient is rotated to the left on today's exam, resulting in distortion of the mediastinal contours and reduced diagnostic sensitivity and specificity for mediastinal pathology. Atherosclerosis in the thoracic aorta. Status post median sternotomy for CABG. IMPRESSION: 1. The appearance the chest is most suggestive of mild congestive heart  failure, superimposed upon a background of underlying emphysema. 2. Aortic atherosclerosis. Electronically Signed   By: Vinnie Langton M.D.   On: 06/10/2016 07:37    EKG:  Orders placed or performed during the hospital encounter of 08/26/15  . EKG 12-Lead  . EKG 12-Lead  . ED EKG  . ED EKG  . EKG    ASSESSMENT AND PLAN:  Active Problems:   Acute respiratory failure with hypoxia and hypercapnia (HCC)   Acute bronchitis   Acute CHF (HCC)   Acute respiratory failure with hypoxia (HCC)  #1 Acute on chronic respiratory failure with hypoxia and hypercapnia due to COPD exacerbation and less likely acute CHF, now off BiPAP, continue oxygen therapy , keeping pulse oximetry at 88-92, appreciate pulmonary input #2. COPD exacerbation, continue steroids, nebulizing therapy, inhalers, antibiotics, improved clinically  #3. Acute bronchitis, continue patient on levofloxacin, blood cultures are negative so far, sputum cultures sent, pending, clinically improved, likely discharge home on levofloxacin  #4 acute CHF, combined systolic diastolic, echocardiogram revealed ejection fraction of 45%, now off Lasix due to acute renal failure, hypotension, remains stable despite IV fluid administration yesterday #5. Hyperglycemia, hemoglobin A1c 5.5, no diabetes #6. Lactic acidosis, likely due to severe hypoxia, lactic acid level was high at 2.3, bicarbonate is 33 today in the morning, cutting down oxygen via nasal cannulas to keep pulse oximetry at 88-94% #7 Leukocytosis, likely stress related, as well as due to infection, continue levofloxacin,  cultures are negative so far, awaiting for sputum cultures, white blood cell count has improved  #8 acute renal failure due to over diuresis, now off Lasix, patient received IV fluids yesterday, now off IV fluids, creatinine has improved,  follow creatinine in the morning  #9 hypotension, status post  IV fluids administration for 6 hours, now off IV fluids. Blood pressure  has improved.  #10. Hyperkalemia, Kayexalate 1 dose was administered, rechecking potassium level later today   Management plans discussed with the patient, family and they are in agreement.   DRUG ALLERGIES:  Allergies  Allergen Reactions  . Codeine Itching and Rash    CODE STATUS:     Code Status Orders        Start     Ordered   06/10/16 1032  Full code  Continuous     06/10/16 1032    Code Status History    Date Active Date Inactive Code Status Order ID Comments User Context   06/10/2016 10:32 AM 06/10/2016 10:50 AM Full Code [id number]  Theodoro Grist, MD Inpatient   09/14/2015  8:19 PM 09/16/2015  8:34 PM Full Code [id number]  Theodoro Grist, MD Inpatient   08/27/2015  8:05 AM 09/01/2015  8:42 PM Full Code [id number]  Harrie Foreman, MD Inpatient      TOTAL TIME TAKING CARE OF THIS PATIENT40 minutes Discussed with patient's family, DC planning was addressed Gaylon Bentz M.D on 06/11/2016 at 1:19 PM  Between 7am to 6pm - Pager - [phone]  After 6pm go to [email or web address] - password [email or web address] [facility]  Office  [phone]  CC: Primary care physician; Volanda Napoleon, MD

## 2016-06-12 NOTE — Evaluation (Signed)
Physical Therapy Evaluation Patient Details Name: Debra [name] MRN: [id number] DOB: 1932-09-26 Today's Date: 06/12/2016   History of Present Illness  Pt is a 82 y/o female presenting with worsening SOB over 3-4 days accompanied by yellow-green phlegm and dyspnea on exertion. Pt admitted for acute respiratory failure with hypoxia. Pt chronically uses 2L of O2 at home. Chest x-ray 06/10/16 shows mild CHF and emphysema. Echo on 06/10/16 shows an EF of 45%. PMH of asthma, COPD, myocardial infarction, and a triple bypass graft.  Clinical Impression  Pt lives at home with granddaughter (who cannot provide assistance) in a mobile home with a ramped entrance. Pt daughter comes 4x/week to assist with bathing and homemaking. At baseline pt was modified independent using rollator and 2L of O2 for short in home distances. Pt at this time demonstrates generalized weakness in B LE's and R UE . Pt able to transfer supine to sit +1 min assist with slight increased dizziness that resolved quickly and able to tolerate that position for approximately 5 minutes. Sitting balance is fair with increased difficulty when challenged. After scooting to EOB pt began to feel increasingly dizzy and was returned to supine. Pt BP lying was 107/74 and in sitting was 132/87. Pt O2 on 2 L/min nasal canula at rest 89% and decreased to 84% with talking. With O2 at 3 L/min pt O2 sats in sitting maintained at 90% (cleared by RN). Pt will benefit from continued PT in order to address above impairments and improve functional mobility as medically appropriate.     Follow Up Recommendations SNF    Equipment Recommendations  Rolling walker with 5" wheels    Recommendations for Other Services       Precautions / Restrictions Precautions Precautions: Fall Restrictions Weight Bearing Restrictions: No      Mobility  Bed Mobility Overal bed mobility: Needs Assistance Bed Mobility: Rolling;Supine to Sit;Sit to Supine Rolling:  Supervision (min vc's; heavy bed rail use)   Supine to sit: Min assist;HOB elevated (Pt required +1 min assist to scoot to EOB) Sit to supine: Min assist;+2 for physical assistance   General bed mobility comments: Pt demonstrates SOB and slight fatigue with all activities; Pt reports mild dizziness initially sitting EOB which resolved in about 1 minute and tolerated sitting EOB for approximately 5 mins; increased dizziness with scooting to EOB that continued and pt returned to supine where the dizziness resolved in approximately 1 minute  Transfers                 General transfer comment: unable to at this time  Ambulation/Gait                Stairs            Wheelchair Mobility    Modified Rankin (Stroke Patients Only)       Balance Overall balance assessment: Needs assistance Sitting-balance support: Single extremity supported;Feet unsupported Sitting balance-Leahy Scale: Fair Sitting balance - Comments: Pt able to sit EOB min guard with min vc's to correct posterior lean; pt unable to reach outside of BOS reporting increased fatigue and difficulty                                     Pertinent Vitals/Pain Pain Assessment: No/denies pain    Home Living Family/patient expects to be discharged to:: Private residence Living Arrangements: Other relatives Available Help at Discharge: Family Type of  Home: Mobile home Home Access: Ramped entrance     Home Layout: One level Home Equipment: [place] - 4 wheels      Prior Function Level of Independence: Needs assistance   Gait / Transfers Assistance Needed: Pt is modified independent with rollator in home distances only  ADL's / Homemaking Assistance Needed: Pt daughter comes 4x/week to assist with bathing and homemaking.         Hand Dominance   Dominant Hand: Right    Extremity/Trunk Assessment   Upper Extremity Assessment: RUE deficits/detail (gross grip strength L>R (min vc's to  not hold breath)) RUE Deficits / Details: Increased difficulty flexing R shoulder through limited AROM         Lower Extremity Assessment: Generalized weakness (at least 2+/5 B knee extension (tested supine, grossly limied to full AROM by 10 degrees or less); pt able to complete heel slides without difficulty or assistance)         Communication   Communication: No difficulties  Cognition Arousal/Alertness: Awake/alert Behavior During Therapy: WFL for tasks assessed/performed Overall Cognitive Status: Within Functional Limits for tasks assessed                      General Comments General comments (skin integrity, edema, etc.): Pt is agreeable to PT session.     Exercises Other Exercises Other Exercises: Pt able to roll x2 trials to the R and x1 trial to the R with min vc's and bedrail use; pt assisted with moving up in bed x2 trials via bridging   Assessment/Plan    PT Assessment Patient needs continued PT services  PT Problem List Decreased strength;Decreased activity tolerance;Decreased balance;Decreased mobility;Cardiopulmonary status limiting activity          PT Treatment Interventions DME instruction;Gait training;Therapeutic activities;Therapeutic exercise;Balance training;Patient/family education    PT Goals (Current goals can be found in the Care Plan section)  Acute Rehab PT Goals Patient Stated Goal: To be more mobile.  PT Goal Formulation: With patient Time For Goal Achievement: 06/26/16 Potential to Achieve Goals: Fair    Frequency Min 2X/week   Barriers to discharge        Co-evaluation               End of Session Equipment Utilized During Treatment: Gait belt;Oxygen Activity Tolerance: Patient limited by fatigue Patient left: in bed;with call bell/phone within reach (RN notified of pt status) Nurse Communication: Mobility status;Precautions         Time: 3474-2595 PT Time Calculation (min) (ACUTE ONLY): 28 min   Charges:          PT G Codes:        Autumnrose Yore, SPT 06/12/2016, 4:08 PM

## 2016-06-12 NOTE — Progress Notes (Signed)
82 y/o F on Lovenox 30 mg daily for DVT prophylaxis. Due to BMI < 40 and CrCl > 30 ml/min, will increase Lovenox dosing to 40 mg q 24 hours.   Ulice Dash, PharmD Clinical Pharmacist

## 2016-06-13 LAB — CBC
HEMATOCRIT: 34.7 % — AB (ref 35.0–47.0)
Hemoglobin: 12 g/dL (ref 12.0–16.0)
MCH: 35.3 pg — AB (ref 26.0–34.0)
MCHC: 34.4 g/dL (ref 32.0–36.0)
MCV: 102.6 fL — AB (ref 80.0–100.0)
Platelets: 232 10*3/uL (ref 150–440)
RBC: 3.38 MIL/uL — AB (ref 3.80–5.20)
RDW: 14.4 % (ref 11.5–14.5)
WBC: 10.7 10*3/uL (ref 3.6–11.0)

## 2016-06-13 LAB — GLUCOSE, CAPILLARY
GLUCOSE-CAPILLARY: 141 mg/dL — AB (ref 65–99)
GLUCOSE-CAPILLARY: 146 mg/dL — AB (ref 65–99)
Glucose-Capillary: 89 mg/dL (ref 65–99)
Glucose-Capillary: 134 mg/dL — ABNORMAL HIGH (ref 65–99)

## 2016-06-13 MED ORDER — SENNOSIDES-DOCUSATE SODIUM 8.6-50 MG PO TABS
2.0000 | ORAL_TABLET | Freq: Two times a day (BID) | ORAL | Status: DC
  Administered 2016-06-13 – 2016-06-15 (×5): 2 via ORAL
  Filled 2016-06-13 (×5): qty 2

## 2016-06-13 MED ORDER — ACETAMINOPHEN 325 MG PO TABS: 650.0000 mg | ORAL_TABLET | Freq: Four times a day (QID) | ORAL | Status: DC | PRN

## 2016-06-13 NOTE — Progress Notes (Addendum)
Debra [name] NAME: Debra [name]    MR#:  [id number]  DATE OF BIRTH:  1933/02/01  SUBJECTIVE:   Patient reports shortness of breath is much improved. She continues have some wheezing. Overall she says she improved since admission.  REVIEW OF SYSTEMS:    Review of Systems  Constitutional: Negative.  Negative for chills, fever and malaise/fatigue.  HENT: Negative.  Negative for ear discharge, ear pain, hearing loss, nosebleeds and sore throat.   Eyes: Negative.  Negative for blurred vision and pain.  Respiratory: Positive for cough and wheezing. Negative for hemoptysis and shortness of breath.   Cardiovascular: Negative.  Negative for chest pain, palpitations and leg swelling.  Gastrointestinal: Negative.  Negative for abdominal pain, blood in stool, diarrhea, nausea and vomiting.  Genitourinary: Negative.  Negative for dysuria.  Musculoskeletal: Negative.  Negative for back pain.  Skin: Negative.   Neurological: Negative for dizziness, tremors, speech change, focal weakness, seizures and headaches.  Endo/Heme/Allergies: Negative.  Does not bruise/bleed easily.  Psychiatric/Behavioral: Negative.  Negative for depression, hallucinations and suicidal ideas.    Tolerating Diet: yes      DRUG ALLERGIES:   Allergies  Allergen Reactions  . Codeine Itching and Rash    VITALS:  Blood pressure 104/64, pulse 74, temperature 98 F (36.7 C), temperature source Oral, resp. rate 16, height '5\' 1"'$  (1.549 m), weight 76.1 kg (167 lb 12.3 oz), SpO2 94 %.  PHYSICAL EXAMINATION:   Physical Exam  Constitutional: She is oriented to person, place, and time and well-developed, well-nourished, and in no distress. No distress.  HENT:  Head: Normocephalic.  Eyes: No scleral icterus.  Neck: Normal range of motion. Neck supple. No JVD present. No tracheal deviation present.  Cardiovascular: Normal rate, regular rhythm and normal heart sounds.   Exam reveals no gallop and no friction rub.   No murmur heard. Pulmonary/Chest: Effort normal. No respiratory distress. She has wheezes. She has no rales. She exhibits no tenderness.  Abdominal: Soft. Bowel sounds are normal. She exhibits no distension and no mass. There is no tenderness. There is no rebound and no guarding.  Musculoskeletal: Normal range of motion. She exhibits no edema.  Neurological: She is alert and oriented to person, place, and time.  Skin: Skin is warm. No rash noted. No erythema.  Psychiatric: Affect and judgment normal.      LABORATORY PANEL:   CBC  Recent Labs Lab 06/13/16 0528  WBC 10.7  HGB 12.0  HCT 34.7*  PLT 232   ------------------------------------------------------------------------------------------------------------------  Chemistries   Recent Labs Lab 06/10/16 0832  06/11/16 0454 06/12/16 0436 06/12/16 1229  NA 135  --  136 138  --   K 4.1  --  3.9 5.5* 4.1  CL 98*  --  99* 102  --   CO2 30  --  30 33*  --   GLUCOSE 126*  --  179* 130*  --   BUN 14  --  28* 33*  --   CREATININE 0.90  < > 1.58* 1.14*  --   CALCIUM 8.5*  --  8.5* 8.9  --   MG  --   --  1.8  --   --   AST 23  --   --   --   --   ALT 17  --   --   --   --   ALKPHOS 49  --   --   --   --  BILITOT 0.9  --   --   --   --   < > = values in this interval not displayed. ------------------------------------------------------------------------------------------------------------------  Cardiac Enzymes  Recent Labs Lab 06/10/16 1043 06/10/16 1623 06/10/16 2303  TROPONINI <0.03 <0.03 <0.03   ------------------------------------------------------------------------------------------------------------------  RADIOLOGY:  No results found.   ASSESSMENT AND PLAN:   82 year old female with a history of COPD and chronic respiratory failure on oxygen at home who presented shortness of breath and found to have acute hypoxic respiratory failure on chronic with COPD  exacerbation and mild congestive heart failure.   1. Acute on chronic hypoxic respiratory failure and hypercapnic respiratory failure: This is due to COPD exacerbation and CHF exacerbation. Off BiPAP and continue oxygen  2. COPD exacerbation with acute bronchitis: Wean steroids as tolerated. Continue inhaler, oxygen and nebulizer treatments. Can stop Levaquin tomorrow.  3. Acute combined diastolic and systolic heart failure with ejection fraction 45%: Patient has diuresed well. She is currently euvolemic.  4. Acute kidney injury due to over diuresis: Patient is now off of Lasix. Creatinine improved.  5. History of ASCVD: Continue aspirin, atorvastatin, Plavix and metoprolol.  6. Depression: Continue Prozac  7. Essential hypertension: Blood pressure low normal and therefore will hold losartan this morning. 8. Tobacco dependence: COUNSELED 3 MINUTES TO stop smoking.   Patient will need skilled machine facility discharge. Clinical social worker consult place.  Management plans discussed with the patient and she is in agreement.  CODE STATUS: Full  TOTAL TIME TAKING CARE OF THIS PATIENT: 25 minutes.     POSSIBLE D/C tomorrow, DEPENDING ON CLINICAL CONDITION.   Anntonette Madewell M.D on 06/13/2016 at 12:38 PM  Between 7am to 6pm - Pager - [phone] After 6pm go to [email or web address] - password [email or web address] [place] [facility]  Office  [phone]  CC: Primary care physician; Volanda Napoleon, MD  Note: This dictation was prepared with Dragon dictation along with smaller phrase technology. Any transcriptional errors that result from this process are unintentional.

## 2016-06-13 NOTE — Progress Notes (Signed)
Physical Therapy Treatment Patient Details Name: Debra [name] MRN: [id number] DOB: [birth year] Today's Date: 06/13/2016    History of Present Illness Pt is a 82 y/o female presenting with worsening SOB over 3-4 days accompanied by yellow-green phlegm and dyspnea on exertion. Pt admitted for acute respiratory failure with hypoxia. Pt chronically uses 2L of O2 at home. Chest x-ray 06/10/16 shows mild CHF and emphysema. Echo on 06/10/16 shows an EF of 45%. PMH of asthma, COPD, myocardial infarction, and a triple bypass graft.    PT Comments    Pt limited this session by BP and O2 status with activity. Pt able to complete bed mobility supervision with HOB and bedrail use and +1 min assist to scoot to EOB. Pt slightly dizzy with this activity which resolved quickly and BP from 164/58 at rest to 138/42 sitting. Pt then attempted to stand +2 min assist using RW and tolerated approximately 30 seconds prior to needing to return to sitting and then supine due to worsening weakness, low back pain (10/10) and dizziness. Pt desat to 83% with standing and PT was unable to get accurate BP . Pt required mod vc's for pursed-lip breathing and calming techniques to improve O2 which returned to 91% and BP was 164/117 (RN notified). Pt will benefit from therapeutic exercise in bed and progressing patients functional mobility as medically appropriate.    Follow Up Recommendations  SNF     Equipment Recommendations  Rolling walker with 5" wheels    Recommendations for Other Services       Precautions / Restrictions Precautions Precautions: Fall Restrictions Weight Bearing Restrictions: No    Mobility  Bed Mobility Overal bed mobility: Needs Assistance Bed Mobility: Supine to Sit;Sit to Supine     Supine to sit: Min assist;HOB elevated Sit to supine: Max assist (+1 due to worsening dizziness)   General bed mobility comments: Pt able to sit EOB for approximately 3 minutes with initial increase in  dzziness that resolved quickly  Transfers Overall transfer level: Needs assistance Equipment used: Rolling walker (2 wheeled) Transfers: Sit to/from Stand Sit to Stand: Min assist;+2 physical assistance         General transfer comment: Pt requires min vc's to use walker to assist in standing and improve standing posture; pt began to feel weak with increased dizziness and reports of feeling very warm and the urgency to urinate; pt immediately returned to supine and HOB elevated with mod vc's to purse-lip breathe and techniques to help calm the patient  Ambulation/Gait             General Gait Details: unable at this time   Stairs            Wheelchair Mobility    Modified Rankin (Stroke Patients Only)       Balance Overall balance assessment: Needs assistance Sitting-balance support: Feet supported Sitting balance-Leahy Scale: Fair Sitting balance - Comments: Pt able to sit EOB min guard with improved posture     Standing balance-Leahy Scale: Poor Standing balance comment: no LOB; assistance required with change in status                    Cognition Arousal/Alertness: Awake/alert Behavior During Therapy: WFL for tasks assessed/performed Overall Cognitive Status: Within Functional Limits for tasks assessed                      Exercises      General Comments General comments (skin integrity, edema,  etc.): Pt is hesitant on going to STR and verbalizes fair understanding with education. Pt agreeable to limited activity this PT session.       Pertinent Vitals/Pain Pain Assessment: 0-10 Pain Score: 10-Worst pain ever (returned to 0/10 after repositioned to supine in bed) Pain Location: Low back Pain Intervention(s): Limited activity within patient's tolerance;Repositioned;Patient requesting pain meds-RN notified    Home Living                      Prior Function            PT Goals (current goals can now be found in the  care plan section) Acute Rehab PT Goals Patient Stated Goal: To feel better and to go home.  PT Goal Formulation: With patient Time For Goal Achievement: 06/26/16 Potential to Achieve Goals: Fair Progress towards PT goals: Progressing toward goals    Frequency    Min 2X/week      PT Plan Current plan remains appropriate    Co-evaluation             End of Session Equipment Utilized During Treatment: Gait belt;Oxygen Activity Tolerance: Treatment limited secondary to medical complications (Comment) Patient left: in bed;with call bell/phone within reach;with bed alarm set;with family/visitor present     Time: 3299-2426 PT Time Calculation (min) (ACUTE ONLY): 38 min  Charges:                       G Codes:      Riley Nearing, SPT 06/13/2016, 5:03 PM

## 2016-06-13 NOTE — NC FL2 (Signed)
[place] LEVEL OF CARE SCREENING TOOL     IDENTIFICATION  Patient Name: Debra [name] Birthdate: 1932/10/19 Sex: female Admission Date (Current Location): 06/10/2016  [place] and [place] Number:  Engineering geologist and Address:  [facility], [address] [place], [place] [address]      Provider Number: [id number]  Attending Physician Name and Address:  Bettey Costa, MD  Relative Name and Phone Number:       Current Level of Care: Hospital Recommended Level of Care: [place] Prior Approval Number:    Date Approved/Denied:   PASRR Number: [id number]  Discharge Plan: SNF    Current Diagnoses: Patient Active Problem List   Diagnosis Date Noted  . Acute respiratory failure with hypoxia and hypercapnia ([place]) 06/10/2016  . Acute bronchitis 06/10/2016  . Acute CHF ([place]) 06/10/2016  . Acute respiratory failure with hypoxia ([place]) 06/10/2016  . Acute on chronic renal failure ([place]) 09/14/2015  . Acidosis 09/14/2015  . Hypotension 09/14/2015  . Hypokalemia 09/14/2015  . COPD exacerbation ([place]) 09/01/2015  . Sepsis ([place]) 08/27/2015    Orientation RESPIRATION BLADDER Height & Weight     Self, Time, Situation, Place  Normal, O2 (3 liters) Incontinent Weight: 167 lb 12.3 oz (76.1 kg) Height:  '5\' 1"'$  (154.9 cm)  BEHAVIORAL SYMPTOMS/MOOD NEUROLOGICAL BOWEL NUTRITION STATUS   (none)  (none) Continent Diet (soft)  AMBULATORY STATUS COMMUNICATION OF NEEDS Skin   Extensive Assist Verbally Normal                       Personal Care Assistance Level of Assistance  Bathing, Dressing Bathing Assistance: Limited assistance   Dressing Assistance: Limited assistance     Functional Limitations Info             SPECIAL CARE FACTORS FREQUENCY  PT (By licensed PT)                    Contractures Contractures Info: Not present    Additional Factors Info  Code Status, Allergies Code Status Info:  full Allergies Info: codeine           Current Medications (06/13/2016):  This is the current hospital active medication list Current Facility-Administered Medications  Medication Dose Route Frequency Provider Last Rate Last Dose  . 0.9 %  sodium chloride infusion  250 mL Intravenous PRN Theodoro Grist, MD      . albuterol (PROVENTIL) (2.5 MG/3ML) 0.083% nebulizer solution 2.5 mg  2.5 mg Nebulization Q2H PRN Theodoro Grist, MD      . albuterol (PROVENTIL) (2.5 MG/3ML) 0.083% nebulizer solution 2.5 mg  2.5 mg Nebulization Q6H Theodoro Grist, MD   2.5 mg at 06/13/16 0720  . ALPRAZolam Duanne Moron) tablet 0.5 mg  0.5 mg Oral BID Theodoro Grist, MD   0.5 mg at 06/13/16 1009  . aspirin chewable tablet 81 mg  81 mg Oral Hulen Skains, MD   81 mg at 06/13/16 0657  . atorvastatin (LIPITOR) tablet 10 mg  10 mg Oral QPM Theodoro Grist, MD   10 mg at 06/12/16 1600  . budesonide (PULMICORT) nebulizer solution 0.25 mg  0.25 mg Nebulization BID Theodoro Grist, MD   0.25 mg at 06/13/16 0719  . clopidogrel (PLAVIX) tablet 75 mg  75 mg Oral Hulen Skains, MD   75 mg at 06/13/16 1009  . diclofenac sodium (VOLTAREN) 1 % transdermal gel 2 g  2 g Topical BID PRN Theodoro Grist, MD      .  enoxaparin (LOVENOX) injection 40 mg  40 mg Subcutaneous Q24H Napoleon Form, RPH   40 mg at 06/12/16 1940  . famotidine (PEPCID) tablet 20 mg  20 mg Oral BID Laverle Hobby, MD   20 mg at 06/13/16 1009  . FLUoxetine (PROZAC) capsule 20 mg  20 mg Oral Daily Theodoro Grist, MD   20 mg at 06/13/16 1009  . guaiFENesin (MUCINEX) 12 hr tablet 600 mg  600 mg Oral BID Theodoro Grist, MD   600 mg at 06/13/16 1009  . HYDROcodone-acetaminophen (NORCO/VICODIN) 5-325 MG per tablet 0.5-1 tablet  0.5-1 tablet Oral Daily PRN Theodoro Grist, MD   1 tablet at 06/12/16 2147  . insulin aspart (novoLOG) injection 0-5 Units  0-5 Units Subcutaneous QHS Awilda Bill, NP      . insulin aspart (novoLOG) injection 0-9 Units  0-9 Units  Subcutaneous TID WC Awilda Bill, NP   1 Units at 06/13/16 1159  . latanoprost (XALATAN) 0.005 % ophthalmic solution 1 drop  1 drop Both Eyes QHS Theodoro Grist, MD   1 drop at 06/12/16 2200  . levofloxacin (LEVAQUIN) tablet 250 mg  250 mg Oral Daily Napoleon Form, RPH   250 mg at 06/13/16 1009  . losartan (COZAAR) tablet 50 mg  50 mg Oral Hulen Skains, MD   50 mg at 06/12/16 0803  . methylPREDNISolone sodium succinate (SOLU-MEDROL) 40 mg/mL injection 40 mg  40 mg Intravenous Q24H Flora Lipps, MD   40 mg at 06/13/16 1311  . mometasone-formoterol (DULERA) 100-5 MCG/ACT inhaler 2 puff  2 puff Inhalation BID Theodoro Grist, MD   2 puff at 06/13/16 1008  . ondansetron (ZOFRAN) tablet 4 mg  4 mg Oral Q6H PRN Theodoro Grist, MD       Or  . ondansetron (ZOFRAN) injection 4 mg  4 mg Intravenous Q6H PRN Theodoro Grist, MD      . phenol (CHLORASEPTIC) mouth spray 1 spray  1 spray Mouth/Throat PRN Flora Lipps, MD   1 spray at 06/12/16 1537  . senna-docusate (Senokot-S) tablet 1 tablet  1 tablet Oral QHS PRN Theodoro Grist, MD   1 tablet at 06/11/16 2123  . sodium chloride flush (NS) 0.9 % injection 3 mL  3 mL Intravenous Q12H Theodoro Grist, MD   3 mL at 06/13/16 1011  . sodium chloride flush (NS) 0.9 % injection 3 mL  3 mL Intravenous Q12H Theodoro Grist, MD   3 mL at 06/13/16 1011  . sodium chloride flush (NS) 0.9 % injection 3 mL  3 mL Intravenous PRN Theodoro Grist, MD   3 mL at 06/13/16 1312  . tiotropium (SPIRIVA) inhalation capsule 18 mcg  18 mcg Inhalation q morning - 10a Theodoro Grist, MD   18 mcg at 06/13/16 1034  . tiZANidine (ZANAFLEX) tablet 2 mg  2 mg Oral TID PRN Theodoro Grist, MD   2 mg at 06/11/16 2123  . traZODone (DESYREL) tablet 50-100 mg  50-100 mg Oral QHS Theodoro Grist, MD   50 mg at 06/12/16 2149     Discharge Medications: Please see discharge summary for a list of discharge medications.  Relevant Imaging Results:  Relevant Lab Results:   Additional  Information [id number]  Shela Leff, LCSW

## 2016-06-14 ENCOUNTER — Inpatient Hospital Stay: Payer: Medicare Other

## 2016-06-14 LAB — GLUCOSE, CAPILLARY
GLUCOSE-CAPILLARY: 97 mg/dL (ref 65–99)
GLUCOSE-CAPILLARY: 204 mg/dL — AB (ref 65–99)
Glucose-Capillary: 113 mg/dL — ABNORMAL HIGH (ref 65–99)
Glucose-Capillary: 178 mg/dL — ABNORMAL HIGH (ref 65–99)

## 2016-06-14 LAB — PROCALCITONIN: Procalcitonin: <0.1 ng/mL

## 2016-06-14 MED ORDER — AMLODIPINE BESYLATE 5 MG PO TABS
5.0000 mg | ORAL_TABLET | Freq: Every day | ORAL | Status: DC
  Administered 2016-06-14 – 2016-06-15 (×2): 5 mg via ORAL
  Filled 2016-06-14 (×2): qty 1

## 2016-06-14 MED ORDER — FUROSEMIDE 10 MG/ML IJ SOLN
40.0000 mg | Freq: Once | INTRAMUSCULAR | Status: AC
  Administered 2016-06-14: 40 mg via INTRAVENOUS
  Filled 2016-06-14: qty 4

## 2016-06-14 MED ORDER — BISACODYL 10 MG RE SUPP: 10.0000 mg | Freq: Every day | RECTAL | Status: DC

## 2016-06-14 MED ORDER — FUROSEMIDE 40 MG PO TABS
40.0000 mg | ORAL_TABLET | Freq: Every day | ORAL | Status: DC
  Administered 2016-06-15: 40 mg via ORAL
  Filled 2016-06-14: qty 1

## 2016-06-14 MED ORDER — LACTULOSE 10 GM/15ML PO SOLN: 30.0000 g | Freq: Three times a day (TID) | ORAL | Status: DC

## 2016-06-14 MED ORDER — ALBUTEROL SULFATE (2.5 MG/3ML) 0.083% IN NEBU
2.5000 mg | INHALATION_SOLUTION | Freq: Three times a day (TID) | RESPIRATORY_TRACT | Status: DC
  Administered 2016-06-15 (×2): 2.5 mg via RESPIRATORY_TRACT
  Filled 2016-06-14 (×2): qty 3

## 2016-06-14 NOTE — Clinical Social Work Note (Signed)
Patient has had a bed offer from [facility] and patient prefers this facility.  Shela Leff MSW,LCSW

## 2016-06-14 NOTE — Discharge Instructions (Signed)
Heart Failure Clinic appointment on June 28, 2016 at 9:00am with Darylene Price, [place]. Please call [phone] to reschedule.

## 2016-06-14 NOTE — Progress Notes (Signed)
BIPAP on standby. Pt is in no distress.

## 2016-06-14 NOTE — Care Management (Signed)
RNCM consult placed due to patient having chronic O2.  Anticipated discharge disposition to SNF.  CSW following. RNCM signing off, please reconsult if needed.

## 2016-06-14 NOTE — Progress Notes (Signed)
[facility] [place] NAME: Debra [name]    MR#:  [id number]  DATE OF BIRTH:  18-May-1933  SUBJECTIVE:   Patient Still with shortness of breath and wheezing with cough.  REVIEW OF SYSTEMS:    Review of Systems  Constitutional: Negative.  Negative for chills, fever and malaise/fatigue.  HENT: Negative.  Negative for ear discharge, ear pain, hearing loss, nosebleeds and sore throat.   Eyes: Negative.  Negative for blurred vision and pain.  Respiratory: Positive for cough, shortness of breath and wheezing. Negative for hemoptysis.   Cardiovascular: Negative.  Negative for chest pain, palpitations and leg swelling.  Gastrointestinal: Negative.  Negative for abdominal pain, blood in stool, diarrhea, nausea and vomiting.  Genitourinary: Negative.  Negative for dysuria.  Musculoskeletal: Negative.  Negative for back pain.  Skin: Negative.   Neurological: Negative for dizziness, tremors, speech change, focal weakness, seizures and headaches.  Endo/Heme/Allergies: Negative.  Does not bruise/bleed easily.  Psychiatric/Behavioral: Negative.  Negative for depression, hallucinations and suicidal ideas.    Tolerating Diet: yes      DRUG ALLERGIES:   Allergies  Allergen Reactions  . Codeine Itching and Rash    VITALS:  Blood pressure (!) 147/63, pulse 61, temperature 98.4 F (36.9 C), temperature source Oral, resp. rate 18, height '5\' 1"'$  (1.549 m), weight 76.1 kg (167 lb 12.3 oz), SpO2 97 %.  PHYSICAL EXAMINATION:   Physical Exam  Constitutional: She is oriented to person, place, and time and well-developed, well-nourished, and in no distress. No distress.  HENT:  Head: Normocephalic.  Eyes: No scleral icterus.  Neck: Normal range of motion. Neck supple. No JVD present. No tracheal deviation present.  Cardiovascular: Normal rate, regular rhythm and normal heart sounds.  Exam reveals no gallop and no friction rub.   No murmur  heard. Pulmonary/Chest: Effort normal. No respiratory distress. She has wheezes. She has no rales. She exhibits no tenderness.  Abdominal: Soft. Bowel sounds are normal. She exhibits no distension and no mass. There is no tenderness. There is no rebound and no guarding.  Musculoskeletal: Normal range of motion. She exhibits edema.  Neurological: She is alert and oriented to person, place, and time.  Skin: Skin is warm. No rash noted. No erythema.  Psychiatric: Affect and judgment normal.      LABORATORY PANEL:   CBC  Recent Labs Lab 06/13/16 0528  WBC 10.7  HGB 12.0  HCT 34.7*  PLT 232   ------------------------------------------------------------------------------------------------------------------  Chemistries   Recent Labs Lab 06/10/16 0832  06/11/16 0454 06/12/16 0436 06/12/16 1229  NA 135  --  136 138  --   K 4.1  --  3.9 5.5* 4.1  CL 98*  --  99* 102  --   CO2 30  --  30 33*  --   GLUCOSE 126*  --  179* 130*  --   BUN 14  --  28* 33*  --   CREATININE 0.90  < > 1.58* 1.14*  --   CALCIUM 8.5*  --  8.5* 8.9  --   MG  --   --  1.8  --   --   AST 23  --   --   --   --   ALT 17  --   --   --   --   ALKPHOS 49  --   --   --   --   BILITOT 0.9  --   --   --   --   < > =  values in this interval not displayed. ------------------------------------------------------------------------------------------------------------------  Cardiac Enzymes  Recent Labs Lab 06/10/16 1043 06/10/16 1623 06/10/16 2303  TROPONINI <0.03 <0.03 <0.03   ------------------------------------------------------------------------------------------------------------------  RADIOLOGY:  Dg Chest 1 View  Result Date: 06/14/2016 CLINICAL DATA:  Shortness of breath.  Tobacco use. EXAM: CHEST 1 VIEW COMPARISON:  June 10, 2016 and August 30, 2015 FINDINGS: There is chronic interstitial prominence in the lower lung zones, somewhat more on the right than on the left, felt to represent a degree  of underlying fibrosis. There may be a mild degree of superimposed interstitial edema in the bases. There is no airspace consolidation. Heart is mildly enlarged with prominence of the central pulmonary arteries and rapid peripheral tapering. Patient is status post coronary artery bypass grafting. There is atherosclerotic calcification in the aorta. The patient has had a previous kyphoplasty in the mid thoracic region. No adenopathy evident. Bones are osteoporotic. IMPRESSION: Underlying interstitial fibrosis. Question mild interstitial edema. There may be mild congestive heart failure superimposed on chronic fibrosis. Heart is mildly enlarged with pulmonary vascularity appearing consistent with a degree of pulmonary arterial hypertension. No adenopathy evident. There is aortic atherosclerosis. Electronically Signed   By: Lowella Grip III M.D.   On: 06/14/2016 10:10     ASSESSMENT AND PLAN:   82 year old female with a history of COPD and chronic respiratory failure on oxygen at home who presented shortness of breath and found to have acute hypoxic respiratory failure on chronic with COPD exacerbation and mild congestive heart failure.   1. Acute on chronic hypoxic respiratory failure and hypercapnic respiratory failure: This is due to COPD exacerbation and CHF exacerbation. Off BiPAP and Now on baseline oxygen. Still complaining of shortness of breath with audible wheezing. 1 dose IV Lasix given.  Today's chest x-ray shows fibrosis. Patient will need outpatient pulmonary follow-up at discharge.   2. COPD exacerbation with acute bronchitis: Still with wheezing.  Wean steroids as tolerated. Continue inhaler, oxygen and nebulizer treatments. Discontinue Levaquin.   3. Acute combined diastolic and systolic heart failure with ejection fraction 45%: Chest x-ray today shows possible CHF. 1 dose IV Lasix and then by mouth in a.m. Continue Losrtan  4. Acute kidney injury due to over diuresis:  resolved Watch creatinine on LASIX again.  5. History of ASCVD: Continue aspirin, atorvastatin, Plavix Losartan Low heart rates so avoiding metoprolol.  6. Depression: Continue Prozac  7. Essential hypertension: Continue losartan and norvasc . 8. Tobacco dependence: COUNSELED 3 MINUTES TO stop smoking.   Patient will need skilled machine facility discharge. Clinical social worker consult place.  Management plans discussed with the patient and she is in agreement.  CODE STATUS: Full  TOTAL TIME TAKING CARE OF THIS PATIENT: 26 minutes.     POSSIBLE D/C tomorrow SNF, DEPENDING ON CLINICAL CONDITION.   Niani Mourer M.D on 06/14/2016 at 11:34 AM  Between 7am to 6pm - Pager - [phone] After 6pm go to [email or web address] - password [email or web address]  Office  [phone]  CC: Primary care physician; Volanda Napoleon, MD  Note: This dictation was prepared with Dragon dictation along with smaller phrase technology. Any transcriptional errors that result from this process are unintentional.

## 2016-06-14 NOTE — Progress Notes (Signed)
Per Dr. Benjie Karvonen do not give oral lasix as pt already received an oral dose this morning. Will begin oral tomorrow

## 2016-06-14 NOTE — Progress Notes (Signed)
Per Dr. Benjie Karvonen okay to discontinue lactulose and suppository as pt had a BM this morning

## 2016-06-15 LAB — CULTURE, BLOOD (ROUTINE X 2)
Culture: NO GROWTH
Culture: NO GROWTH

## 2016-06-15 LAB — BASIC METABOLIC PANEL
ANION GAP: 6 (ref 5–15)
BUN: 31 mg/dL — ABNORMAL HIGH (ref 6–20)
CALCIUM: 9.4 mg/dL (ref 8.9–10.3)
CO2: 36 mmol/L — AB (ref 22–32)
Chloride: 98 mmol/L — ABNORMAL LOW (ref 101–111)
Creatinine, Ser: 0.99 mg/dL (ref 0.44–1.00)
GFR calc non Af Amer: 52 mL/min — ABNORMAL LOW (ref >60)
Glucose, Bld: 100 mg/dL — ABNORMAL HIGH (ref 65–99)
Potassium: 4.8 mmol/L (ref 3.5–5.1)
Sodium: 140 mmol/L (ref 135–145)

## 2016-06-15 LAB — GLUCOSE, CAPILLARY
GLUCOSE-CAPILLARY: 84 mg/dL (ref 65–99)
GLUCOSE-CAPILLARY: 117 mg/dL — AB (ref 65–99)

## 2016-06-15 MED ORDER — LOSARTAN POTASSIUM 50 MG PO TABS: 50.0000 mg | ORAL_TABLET | ORAL | 0 refills | Status: DC

## 2016-06-15 MED ORDER — PREDNISONE 10 MG PO TABS: 10.0000 mg | ORAL_TABLET | Freq: Every day | ORAL | 0 refills | Status: DC

## 2016-06-15 MED ORDER — FUROSEMIDE 40 MG PO TABS: 40.0000 mg | ORAL_TABLET | Freq: Every day | ORAL | 0 refills | Status: DC

## 2016-06-15 MED ORDER — ALPRAZOLAM 0.5 MG PO TABS: 0.5000 mg | ORAL_TABLET | Freq: Two times a day (BID) | ORAL | 0 refills | Status: DC

## 2016-06-15 MED ORDER — HYDROCODONE-ACETAMINOPHEN 5-325 MG PO TABS: 1.0000 | ORAL_TABLET | Freq: Four times a day (QID) | ORAL | 0 refills | Status: DC | PRN

## 2016-06-15 MED ORDER — ALBUTEROL SULFATE (2.5 MG/3ML) 0.083% IN NEBU: 2.5000 mg | INHALATION_SOLUTION | RESPIRATORY_TRACT | 12 refills | Status: DC | PRN

## 2016-06-15 NOTE — Progress Notes (Signed)
Pt d/c to [facility] today.  IV removed intact.  Report called to [facility].  Pt d/c via EMS.

## 2016-06-15 NOTE — Clinical Social Work Note (Signed)
Patient discharging today to [facility]. Patient is aware and in agreement. Discharge information sent to [facility]. [facility] is aware. Nurse to call report then EMS. Shela Leff MSW,LCSW [phone]

## 2016-06-15 NOTE — Care Management Important Message (Signed)
Important Message  Patient Details  Name: Debra [name] MRN: [id number] Date of Birth: 12-20-1932   Medicare Important Message Given:  Yes    Beverly Sessions, RN 06/15/2016, 12:21 PM

## 2016-06-15 NOTE — Discharge Summary (Signed)
[facility] at [place] NAME: Debra [name]    MR#:  [id number]  DATE OF BIRTH:  May 19, 1933  DATE OF ADMISSION:  06/10/2016 ADMITTING PHYSICIAN: Theodoro Grist, MD  DATE OF DISCHARGE: 06/15/2016  PRIMARY CARE PHYSICIAN: Volanda Napoleon, MD    ADMISSION DIAGNOSIS:  COPD exacerbation ([place]) [J44.1] Acute on chronic respiratory failure with hypoxemia (HCC) [J96.21] Congestive heart failure, unspecified congestive heart failure chronicity, unspecified congestive heart failure type ([place]) [I50.9]  DISCHARGE DIAGNOSIS:  Active Problems:   Acute respiratory failure with hypoxia and hypercapnia (HCC)   Acute bronchitis   Acute CHF ([place])   Acute respiratory failure with hypoxia ([place])   SECONDARY DIAGNOSIS:   Past Medical History:  Diagnosis Date  . Asthma   . COPD (chronic obstructive pulmonary disease) ([place])   . Myocardial infarction     HOSPITAL COURSE:   82 year old female with a history of COPD and chronic respiratory failure on oxygen at home who presented shortness of breath and found to have acute hypoxic respiratory failure on chronic with COPD exacerbation and mild congestive heart failure.   1. Acute on chronic hypoxic respiratory failure and hypercapnic respiratory failure: This is due to COPD exacerbation and CHF exacerbation. She was initially on  BiPAP and Now on baseline oxygen. She was treated for COPD and CHF.  Patient will need outpatient pulmonary follow-up at discharge.   2. COPD exacerbation with acute bronchitis: She will be transitioned to oral steroids and will   Continue inhaler, oxygen and nebulizer treatments. She completed Levaquin therapy   3. Acute combined diastolic and systolic heart failure with ejection fraction 45%: She will be discharged on PO Lasix and  Losrtan. Due to low heart rates she was off of beta blocker  4. Acute kidney injury due to over diuresis: This has resolved   5.  History of ASCVD: Continue aspirin, atorvastatin, Plavix Losartan Low heart rates so avoiding metoprolol.  6. Depression: Continue Prozac  7. Essential hypertension: Continue losartan and norvasc . 8. Tobacco dependence: COUNSELED 3 MINUTES TO stop smoking.She wants to stop!   DISCHARGE CONDITIONS AND DIET:   Stable Cardiac diet  CONSULTS OBTAINED:    DRUG ALLERGIES:   Allergies  Allergen Reactions  . Codeine Itching and Rash    DISCHARGE MEDICATIONS:   Current Discharge Medication List    START taking these medications   Details  furosemide (LASIX) 40 MG tablet Take 1 tablet (40 mg total) by mouth daily. Qty: 30 tablet, Refills: 0      CONTINUE these medications which have CHANGED   Details  ALPRAZolam (XANAX) 0.5 MG tablet Take 1 tablet (0.5 mg total) by mouth 2 (two) times daily. Qty: 30 tablet, Refills: 0    HYDROcodone-acetaminophen (NORCO/VICODIN) 5-325 MG tablet Take 1 tablet by mouth every 6 (six) hours as needed for moderate pain. Qty: 30 tablet, Refills: 0    losartan (COZAAR) 50 MG tablet Take 1 tablet (50 mg total) by mouth every morning. Qty: 30 tablet, Refills: 0    predniSONE (DELTASONE) 10 MG tablet Take 1 tablet (10 mg total) by mouth daily with breakfast. 60 mg PO (ORAL) x 2 days 50 mg PO (ORAL)  x 2 days 40 mg PO (ORAL)  x 2 days 30 mg PO  (ORAL)  x 2 days 20 mg PO  (ORAL) x 2 days 10 mg PO  (ORAL) x 2 days then stop Qty: 42 tablet, Refills: 0      CONTINUE  these medications which have NOT CHANGED   Details  albuterol (PROVENTIL HFA;VENTOLIN HFA) 108 (90 Base) MCG/ACT inhaler Inhale 2 puffs into the lungs every 6 (six) hours as needed for wheezing or shortness of breath.    albuterol (PROVENTIL) (2.5 MG/3ML) 0.083% nebulizer solution Take 2.5 mg by nebulization 4 (four) times daily as needed for wheezing or shortness of breath.    aspirin 81 MG chewable tablet Chew by mouth every morning.    atorvastatin (LIPITOR) 10 MG tablet Take  10 mg by mouth every evening.    clopidogrel (PLAVIX) 75 MG tablet Take 75 mg by mouth every morning.     diclofenac sodium (VOLTAREN) 1 % GEL Apply 2 g topically 2 (two) times daily as needed (for pain).    FLUoxetine (PROZAC) 20 MG capsule Take 1 capsule (20 mg total) by mouth daily. Qty: 30 capsule, Refills: 3    latanoprost (XALATAN) 0.005 % ophthalmic solution Place 1 drop into both eyes at bedtime.    mometasone-formoterol (DULERA) 100-5 MCG/ACT AERO Inhale 2 puffs into the lungs 2 (two) times daily. Qty: 1 Inhaler, Refills: 1    pantoprazole (PROTONIX) 40 MG tablet Take 40 mg by mouth every morning.     tiotropium (SPIRIVA HANDIHALER) 18 MCG inhalation capsule Place 1 capsule (18 mcg total) into inhaler and inhale every morning. Qty: 30 capsule, Refills: 12    tiZANidine (ZANAFLEX) 2 MG tablet Take 2 mg by mouth 3 (three) times daily as needed for muscle spasms.     traZODone (DESYREL) 50 MG tablet Take 50-100 mg by mouth at bedtime.     hydrochlorothiazide (HYDRODIURIL) 25 MG tablet Take 1 tablet (25 mg total) by mouth daily. Qty: 30 tablet, Refills: 1      STOP taking these medications     azithromycin (ZITHROMAX) 500 MG tablet               Today   CHIEF COMPLAINT:  Doing well this am no SOB or wheezing   VITAL SIGNS:  Blood pressure 125/68, pulse 62, temperature 98.2 F (36.8 C), temperature source Oral, resp. rate 18, height '5\' 1"'$  (1.549 m), weight 76.1 kg (167 lb 12.3 oz), SpO2 97 %.   REVIEW OF SYSTEMS:  Review of Systems  Constitutional: Negative.  Negative for chills, fever and malaise/fatigue.  HENT: Negative.  Negative for ear discharge, ear pain, hearing loss, nosebleeds and sore throat.   Eyes: Negative.  Negative for blurred vision and pain.  Respiratory: Negative.  Negative for cough, hemoptysis, shortness of breath and wheezing.   Cardiovascular: Negative.  Negative for chest pain, palpitations and leg swelling.  Gastrointestinal:  Negative.  Negative for abdominal pain, blood in stool, diarrhea, nausea and vomiting.  Genitourinary: Negative.  Negative for dysuria.  Musculoskeletal: Negative.  Negative for back pain.  Skin: Negative.   Neurological: Negative for dizziness, tremors, speech change, focal weakness, seizures and headaches.  Endo/Heme/Allergies: Negative.  Does not bruise/bleed easily.  Psychiatric/Behavioral: Negative.  Negative for depression, hallucinations and suicidal ideas.     PHYSICAL EXAMINATION:  GENERAL:  82 y.o.-year-old patient lying in the bed with no acute distress.  NECK:  Supple, no jugular venous distention. No thyroid enlargement, no tenderness.  LUNGS: Normal breath sounds bilaterally, minimal exp wheezing, NO rales,rhonchi  No use of accessory muscles of respiration.  CARDIOVASCULAR: S1, S2 normal. No murmurs, rubs, or gallops.  ABDOMEN: Soft, non-tender, non-distended. Bowel sounds present. No organomegaly or mass.  EXTREMITIES: No pedal edema, cyanosis, or clubbing.  PSYCHIATRIC: The patient is alert and oriented x 3.  SKIN: No obvious rash, lesion, or ulcer.   DATA REVIEW:   CBC  Recent Labs Lab 06/13/16 0528  WBC 10.7  HGB 12.0  HCT 34.7*  PLT 232    Chemistries   Recent Labs Lab 06/10/16 0832  06/11/16 0454  06/15/16 0457  NA 135  --  136  < > 140  K 4.1  --  3.9  < > 4.8  CL 98*  --  99*  < > 98*  CO2 30  --  30  < > 36*  GLUCOSE 126*  --  179*  < > 100*  BUN 14  --  28*  < > 31*  CREATININE 0.90  < > 1.58*  < > 0.99  CALCIUM 8.5*  --  8.5*  < > 9.4  MG  --   --  1.8  --   --   AST 23  --   --   --   --   ALT 17  --   --   --   --   ALKPHOS 49  --   --   --   --   BILITOT 0.9  --   --   --   --   < > = values in this interval not displayed.  Cardiac Enzymes  Recent Labs Lab 06/10/16 1043 06/10/16 1623 06/10/16 2303  TROPONINI <0.03 <0.03 <0.03    Microbiology Results  '@MICRORSLT48'$ @  RADIOLOGY:  Dg Chest 1 View  Result Date:  06/14/2016 CLINICAL DATA:  Shortness of breath.  Tobacco use. EXAM: CHEST 1 VIEW COMPARISON:  June 10, 2016 and August 30, 2015 FINDINGS: There is chronic interstitial prominence in the lower lung zones, somewhat more on the right than on the left, felt to represent a degree of underlying fibrosis. There may be a mild degree of superimposed interstitial edema in the bases. There is no airspace consolidation. Heart is mildly enlarged with prominence of the central pulmonary arteries and rapid peripheral tapering. Patient is status post coronary artery bypass grafting. There is atherosclerotic calcification in the aorta. The patient has had a previous kyphoplasty in the mid thoracic region. No adenopathy evident. Bones are osteoporotic. IMPRESSION: Underlying interstitial fibrosis. Question mild interstitial edema. There may be mild congestive heart failure superimposed on chronic fibrosis. Heart is mildly enlarged with pulmonary vascularity appearing consistent with a degree of pulmonary arterial hypertension. No adenopathy evident. There is aortic atherosclerosis. Electronically Signed   By: Lowella Grip III M.D.   On: 06/14/2016 10:10      Management plans discussed with the patient and se is in agreement. Stable for discharge snf  Patient should follow up with pcp  CODE STATUS:     Code Status Orders        Start     Ordered   06/10/16 1032  Full code  Continuous     06/10/16 1032    Code Status History    Date Active Date Inactive Code Status Order ID Comments User Context   06/10/2016 10:32 AM 06/10/2016 10:50 AM Full Code [id number]  Theodoro Grist, MD Inpatient   09/14/2015  8:19 PM 09/16/2015  8:34 PM Full Code [id number]  Theodoro Grist, MD Inpatient   08/27/2015  8:05 AM 09/01/2015  8:42 PM Full Code [id number]  Harrie Foreman, MD Inpatient      TOTAL TIME TAKING CARE OF THIS PATIENT: 35 minutes.    Note: This dictation  was prepared with Dragon dictation along with smaller  phrase technology. Any transcriptional errors that result from this process are unintentional.  Dolly Harbach M.D on 06/15/2016 at 9:07 AM  Between 7am to 6pm - Pager - [phone] After 6pm go to [email or web address] - password [facility]  Office  [phone]  CC: Primary care physician; Volanda Napoleon, MD

## 2016-06-21 NOTE — Progress Notes (Signed)
[facility]     Assessment and Plan:  COPD with exacerbation.  -Appears to be recovering, but not yet back to baseline. -Continue current inhaled medications.  Nicotine abuse.  -She has not been smoking as she is currently in a rehabilitation center. -She is committed to quitting smoking, however, and does not want to restart when she goes home. We discussed the importance of not restarting smoking when she goes home. Greater than 3 minutes spent in smoking cessation discussion.  Dyspnea with chronic hypoxic respiratory failure.  -Continue on oxygen at 2 L.  Chronic systolic congestive heart failure. -Most recent echocardiogram showed ejection fraction equals 45%. -Likely contributing to dyspnea.  Date: 06/21/2016  MRN# [id number] Debra [name] 06-03-1933   Debra [name] is a 82 y.o. old female seen in consultation for chief complaint of:    Chief Complaint  Patient presents with  . Hospitalization Follow-up    pt states breathing has improved since being discharged. pt c/o sob with exertion, prod cough with mucus & occ wheezing. on 2L 02    HPI:  The patient is an 82 year old female with a history of COPD, she was recently seen in the hospital with a COPD exacerbation in October 2017; and previous admission on 08/2015.  She is in the clinic today and feeling better, she is currently in rehab in [facility]. She is a wheelchair and her pajamas. She has been advancing her activity in rehab. She is using spiriva every day, proventil.  She is currently 2L of oxygen down from 3L.   Echocardiogram 06/10/16: Severe LVH, EF equals 45%  PMHX:   Past Medical History:  Diagnosis Date  . Asthma   . COPD (chronic obstructive pulmonary disease) ([place])   . Myocardial infarction    Surgical Hx:  Past Surgical History:  Procedure Laterality Date  . ABDOMINAL HYSTERECTOMY    . BREAST SURGERY    . BYPASS GRAFT  2007   triple  . CHOLECYSTECTOMY    .  TONSILLECTOMY     Family Hx:  Family History  Problem Relation Age of Onset  . CAD Father    Social Hx:   Social History  Substance Use Topics  . Smoking status: Current Some Day Smoker  . Smokeless tobacco: Never Used  . Alcohol use No   Medication:   Reviewed    Allergies:  Codeine  Review of Systems: Gen:  Denies  fever, sweats, chills HEENT: Denies blurred vision, double vision. bleeds, sore throat Cvc:  No dizziness, chest pain. Resp:   Denies cough or sputum production, shortness of breath Gi: Denies swallowing difficulty, stomach pain. Gu:  Denies bladder incontinence, burning urine Ext:   No Joint pain, stiffness. Skin: No skin rash,  hives  Endoc:  No polyuria, polydipsia. Psych: No depression, insomnia. Other:  All other systems were reviewed with the patient and were negative other that what is mentioned in the HPI.   Physical Examination:   VS: BP (!) 102/58 (BP Location: Left Arm, Cuff Size: Normal)   Pulse 84   Ht '5\' 1"'$  (1.549 m)   Wt 162 lb (73.5 kg)   SpO2 100%   BMI 30.61 kg/m   General Appearance: No distress  Neuro:without focal findings,  speech normal,  HEENT: PERRLA, EOM intact.   Pulmonary: normal breath sounds, No wheezing.  CardiovascularNormal S1,S2.  No m/r/g.   Abdomen: Benign, Soft, non-tender. Renal:  No costovertebral tenderness  GU:  No performed at this time.  Endoc: No evident thyromegaly, no signs of acromegaly. Skin:   warm, no rashes, no ecchymosis  Extremities: normal, no cyanosis, clubbing.  Other findings:    LABORATORY PANEL:   CBC No results for input(s): WBC, HGB, HCT, PLT in the last 168 hours. ------------------------------------------------------------------------------------------------------------------  Chemistries   Recent Labs Lab 06/15/16 0457  NA 140  K 4.8  CL 98*  CO2 36*  GLUCOSE 100*  BUN 31*  CREATININE 0.99  CALCIUM 9.4    ------------------------------------------------------------------------------------------------------------------  Cardiac Enzymes No results for input(s): TROPONINI in the last 168 hours. ------------------------------------------------------------  RADIOLOGY:  No results found.     Thank  you for the consultation and for allowing [facility] to assist in the care of your patient. Our recommendations are noted above.  Please contact us if we can be of further service.   Marda Stalker, MD.  Board Certified in Internal Medicine, Pulmonary Medicine, [place], and Sleep Medicine.  [facility] Office Number: [phone]  Patricia Pesa, M.D.  Vilinda Boehringer, M.D.  Merton Border, M.D  06/21/2016

## 2016-06-22 ENCOUNTER — Ambulatory Visit (INDEPENDENT_AMBULATORY_CARE_PROVIDER_SITE_OTHER): Payer: Medicare Other | Admitting: Internal Medicine

## 2016-06-22 ENCOUNTER — Encounter: Payer: Self-pay | Admitting: Internal Medicine

## 2016-06-22 VITALS — BP 102/58 | HR 84 | Ht 61.0 in | Wt 162.0 lb

## 2016-06-22 DIAGNOSIS — J438 Other emphysema: Secondary | ICD-10-CM

## 2016-06-22 DIAGNOSIS — J9621 Acute and chronic respiratory failure with hypoxia: Secondary | ICD-10-CM

## 2016-06-22 NOTE — Patient Instructions (Signed)
--  Do not start smoking again, if you do it may kill you.  --Quitting smoking is the most important thing that you can do for your health. Quitting smoking has greater affect on your health than any medicine that we can give you.    --continue to try to be as active as possible.

## 2016-06-25 ENCOUNTER — Ambulatory Visit: Payer: Medicare Other | Admitting: Family

## 2016-06-28 ENCOUNTER — Ambulatory Visit: Payer: Medicare Other | Attending: Family | Admitting: Family

## 2016-06-28 ENCOUNTER — Encounter: Payer: Self-pay | Admitting: Family

## 2016-06-28 VITALS — BP 71/46 | HR 79 | Resp 20 | Ht 60.0 in | Wt 162.0 lb

## 2016-06-28 DIAGNOSIS — Z87891 Personal history of nicotine dependence: Secondary | ICD-10-CM | POA: Diagnosis not present

## 2016-06-28 DIAGNOSIS — I252 Old myocardial infarction: Secondary | ICD-10-CM | POA: Diagnosis not present

## 2016-06-28 DIAGNOSIS — Z7982 Long term (current) use of aspirin: Secondary | ICD-10-CM | POA: Insufficient documentation

## 2016-06-28 DIAGNOSIS — I952 Hypotension due to drugs: Secondary | ICD-10-CM

## 2016-06-28 DIAGNOSIS — J439 Emphysema, unspecified: Secondary | ICD-10-CM | POA: Insufficient documentation

## 2016-06-28 DIAGNOSIS — J449 Chronic obstructive pulmonary disease, unspecified: Secondary | ICD-10-CM | POA: Diagnosis not present

## 2016-06-28 DIAGNOSIS — I5022 Chronic systolic (congestive) heart failure: Secondary | ICD-10-CM | POA: Diagnosis present

## 2016-06-28 DIAGNOSIS — I959 Hypotension, unspecified: Secondary | ICD-10-CM | POA: Diagnosis not present

## 2016-06-28 DIAGNOSIS — Z720 Tobacco use: Secondary | ICD-10-CM

## 2016-06-28 DIAGNOSIS — Z79899 Other long term (current) drug therapy: Secondary | ICD-10-CM | POA: Diagnosis not present

## 2016-06-28 NOTE — Addendum Note (Signed)
Addended by: Darylene Price A on: 06/28/2016 01:34 PM   Modules accepted: Orders

## 2016-06-28 NOTE — Patient Instructions (Signed)
Begin weighing daily and call for an overnight weight gain of > 2 pounds or a weekly weight gain of >5 pounds.  Stop hydrochlorithiazide.  Decrease losartan (cozaar) to '25mg'$  daily.

## 2016-06-28 NOTE — Progress Notes (Signed)
Patient ID: Debra [name], female    DOB: 1933-04-30, 82 y.o.   MRN: [id number]  HPI  Ms Debra [name] is a 82 y/o female with a history of a MI, COPD, asthma, recent and long-term tobacco use and chronic heart failure.  Last echo was done 06/10/16 which showed an EF of 45% with trivial MR and mild TR.   Was recently admitted on 06/10/16 with exacerbation of her COPD and HF. Was treated with bipap, nebulizers and antibiotics. Had acute kidney injury due to over diuresis which resolved before discharge. Was discharged to [facility] for rehab.   She presents today for her initial visit with fatigue and shortness of breath with little exertion. No swelling in her legs or abdomen. Does get light-headed at times with position changes. BP has been running low. Getting weighed weekly at [facility] and will be going back home in 5 days with physical therapy and home health nursing. Does walk with a walker.   Past Medical History:  Diagnosis Date  . Asthma   . COPD (chronic obstructive pulmonary disease) ([place])   . Myocardial infarction     Past Surgical History:  Procedure Laterality Date  . ABDOMINAL HYSTERECTOMY    . BREAST SURGERY    . BYPASS GRAFT  2007   triple  . CHOLECYSTECTOMY    . TONSILLECTOMY      Family History  Problem Relation Age of Onset  . CAD Father      Social History  Substance Use Topics  . Smoking status: Former Smoker    Packs/day: 0.50    Years: 55.00  . Smokeless tobacco: Never Used     Comment: quit smoking 06-08-16  . Alcohol use No    Allergies  Allergen Reactions  . Codeine Itching and Rash    Prior to Admission medications   Medication Sig Start Date End Date Taking? Authorizing Provider  albuterol (PROVENTIL HFA;VENTOLIN HFA) 108 (90 Base) MCG/ACT inhaler Inhale 2 puffs into the lungs every 6 (six) hours as needed for wheezing or shortness of breath.   Yes Historical Provider, MD  albuterol (PROVENTIL) (2.5 MG/3ML) 0.083% nebulizer solution  Take 2.5 mg by nebulization 4 (four) times daily as needed for wheezing or shortness of breath.   Yes Historical Provider, MD  ALPRAZolam Duanne Moron) 0.5 MG tablet Take 1 tablet (0.5 mg total) by mouth 2 (two) times daily. 06/15/16  Yes Bettey Costa, MD  aspirin 81 MG chewable tablet Chew by mouth every morning.   Yes Historical Provider, MD  atorvastatin (LIPITOR) 10 MG tablet Take 10 mg by mouth every evening. 05/16/16  Yes Historical Provider, MD  clopidogrel (PLAVIX) 75 MG tablet Take 75 mg by mouth every morning.    Yes Historical Provider, MD  diclofenac sodium (VOLTAREN) 1 % GEL Apply 2 g topically 2 (two) times daily as needed (for pain).   Yes Historical Provider, MD  FLUoxetine (PROZAC) 20 MG capsule Take 1 capsule (20 mg total) by mouth daily. 09/01/15  Yes Aldean Jewett, MD  furosemide (LASIX) 40 MG tablet Take 1 tablet (40 mg total) by mouth daily. 06/15/16  Yes Bettey Costa, MD  hydrochlorothiazide (HYDRODIURIL) 25 MG tablet Take 1 tablet (25 mg total) by mouth daily. 09/01/15  Yes Aldean Jewett, MD  HYDROcodone-acetaminophen (NORCO/VICODIN) 5-325 MG tablet Take 1 tablet by mouth every 6 (six) hours as needed for moderate pain. 06/15/16  Yes Sital Mody, MD  latanoprost (XALATAN) 0.005 % ophthalmic solution Place 1 drop into  both eyes at bedtime. 05/10/16  Yes Historical Provider, MD  losartan (COZAAR) 50 MG tablet Take 1 tablet (50 mg total) by mouth every morning. 06/15/16  Yes Sital Mody, MD  mometasone-formoterol (DULERA) 100-5 MCG/ACT AERO Inhale 2 puffs into the lungs 2 (two) times daily. 09/01/15  Yes Aldean Jewett, MD  pantoprazole (PROTONIX) 40 MG tablet Take 40 mg by mouth every morning.    Yes Historical Provider, MD  predniSONE (DELTASONE) 10 MG tablet Take 10 mg by mouth daily.   Yes Historical Provider, MD  tiotropium (SPIRIVA HANDIHALER) 18 MCG inhalation capsule Place 1 capsule (18 mcg total) into inhaler and inhale every morning. 09/01/15  Yes Aldean Jewett, MD   tiZANidine (ZANAFLEX) 2 MG tablet Take 2 mg by mouth 3 (three) times daily as needed for muscle spasms.    Yes Historical Provider, MD  traZODone (DESYREL) 50 MG tablet Take 50 mg by mouth at bedtime.    Yes Historical Provider, MD   s  Review of Systems  Constitutional: Positive for fatigue. Negative for appetite change.  HENT: Positive for rhinorrhea. Negative for congestion and sore throat.   Eyes: Negative.   Respiratory: Positive for cough and shortness of breath. Negative for chest tightness and wheezing.   Cardiovascular: Negative for chest pain, palpitations and leg swelling.  Gastrointestinal: Negative for abdominal distention and abdominal pain.  Endocrine: Negative.   Genitourinary: Negative.   Musculoskeletal: Positive for back pain. Negative for neck pain.  Skin: Negative.   Allergic/Immunologic: Negative.   Neurological: Positive for light-headedness (sometimes if change positions too quickly). Negative for dizziness.  Hematological: Negative for adenopathy. Bruises/bleeds easily.  Psychiatric/Behavioral: Negative for dysphoric mood and sleep disturbance (sleeping with oxygen @ 3L). The patient is not nervous/anxious.    Vitals:   06/28/16 0934  BP: (!) 71/46  Pulse: 79  Resp: 20  SpO2: 100%  Weight: 162 lb (73.5 kg)  Height: 5' (1.524 m)      Physical Exam  Constitutional: She is oriented to person, place, and time. She appears well-developed and well-nourished.  HENT:  Head: Normocephalic and atraumatic.  Eyes: Conjunctivae are normal. Pupils are equal, round, and reactive to light.  Neck: Normal range of motion. Neck supple.  Cardiovascular: Normal rate and regular rhythm.   Pulmonary/Chest: Effort normal. She has no wheezes. She has no rales.  Abdominal: Soft. She exhibits no distension. There is no tenderness.  Musculoskeletal: She exhibits no edema or tenderness.  Neurological: She is alert and oriented to person, place, and time.  Skin: Skin is warm  and dry.  Psychiatric: She has a normal mood and affect. Her behavior is normal. Thought content normal.  Nursing note and vitals reviewed.    Assessment & Plan:  1: Chronic heart failure with reduced ejection fraction- - NYHA class III - euvolemic - begin weighing daily and call for overnight weight gain of >2 pounds or a weekly weight gain of >5 pounds. Does have scales at home for her use - does eat salty type foods at home like nabs and importance of following a '2000mg'$  sodium diet was discussed - pharmD went in and reviewed medications with patient and daughter - could consider changing losartan to entresto if her blood pressure improves  2: Hypotension- - BP low and patient does endorse dizziness - stop HCTZ - decrease losartan to '25mg'$  daily - may need to also reduce furosemide in the future - sees PCP 07/16/16  3: COPD- - wears oxygen at 3L around  the clock - uses her inhalers & nebulizers - saw her pulmonologist on 06/22/16  4: Tobacco use- - Patient says that she quit smoking 06/08/16 - continued cessation discussed for 3 minutes with her  Return here in 2 weeks or sooner for any questions/problems before then.

## 2016-07-13 ENCOUNTER — Ambulatory Visit: Payer: Medicare Other | Admitting: Family

## 2016-09-11 IMAGING — CR DG CHEST 1V
1 series · 1 of 1 positions shown · non-contrast
Comparison: September 01, 2015

CLINICAL DATA: Dizziness and hypotension

EXAM:
CHEST 1 VIEW

[portable]
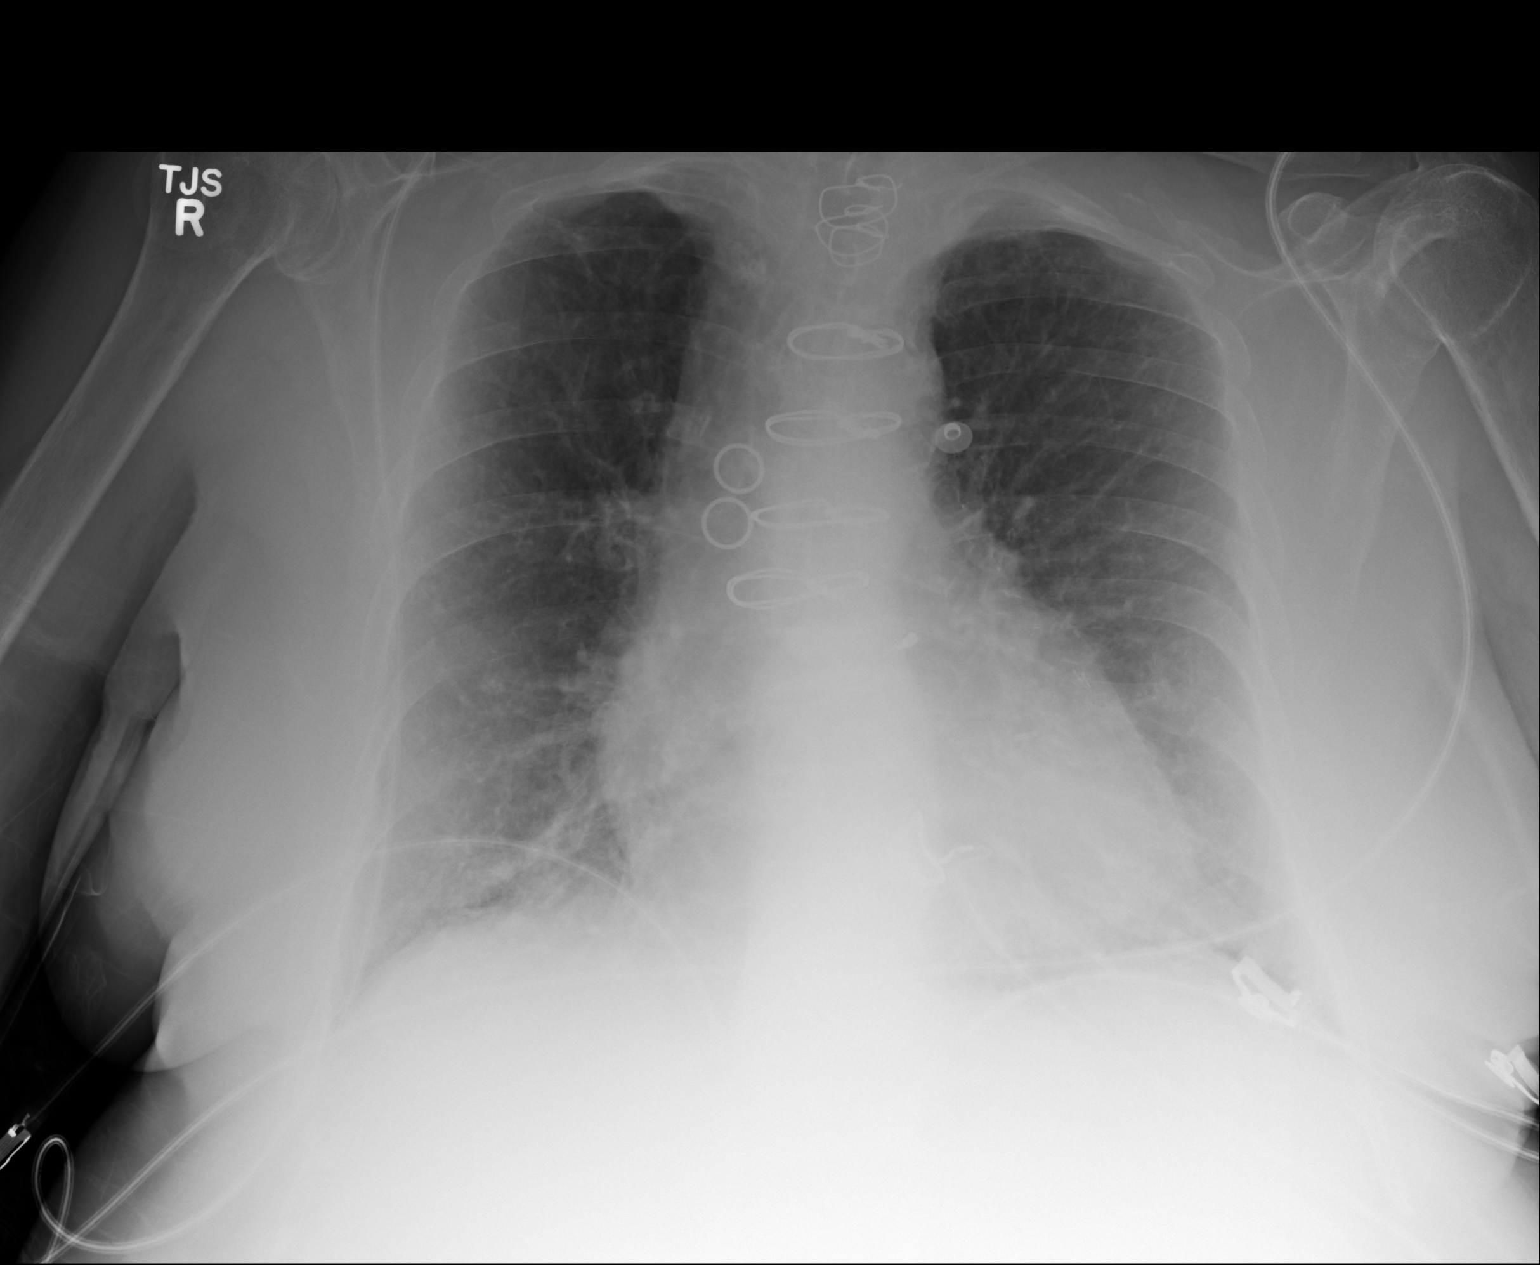

[1 of 1 positions shown; findings below may reference images not displayed]

FINDINGS: There is no edema or consolidation. Heart is borderline enlarged
with pulmonary vascularity within normal limits. No adenopathy.
Patient is status post coronary artery bypass grafting. There is
degenerative change in each shoulder.
IMPRESSION: Borderline cardiac enlargement.  No edema or consolidation.

## 2016-10-05 ENCOUNTER — Inpatient Hospital Stay
Admission: EM | Admit: 2016-10-05 | Discharge: 2016-10-10 | DRG: 871 | Disposition: A | Discharge status: Skilled Nursing Facility | Payer: Medicare Other | Attending: Internal Medicine | Admitting: Internal Medicine

## 2016-10-05 ENCOUNTER — Emergency Department: Payer: Medicare Other

## 2016-10-05 ENCOUNTER — Encounter: Payer: Self-pay | Admitting: Emergency Medicine

## 2016-10-05 DIAGNOSIS — J9601 Acute respiratory failure with hypoxia: Secondary | ICD-10-CM

## 2016-10-05 DIAGNOSIS — J189 Pneumonia, unspecified organism: Secondary | ICD-10-CM | POA: Diagnosis present

## 2016-10-05 DIAGNOSIS — M6281 Muscle weakness (generalized): Secondary | ICD-10-CM

## 2016-10-05 DIAGNOSIS — Z87891 Personal history of nicotine dependence: Secondary | ICD-10-CM | POA: Diagnosis not present

## 2016-10-05 DIAGNOSIS — I252 Old myocardial infarction: Secondary | ICD-10-CM

## 2016-10-05 DIAGNOSIS — R262 Difficulty in walking, not elsewhere classified: Secondary | ICD-10-CM

## 2016-10-05 DIAGNOSIS — J9621 Acute and chronic respiratory failure with hypoxia: Secondary | ICD-10-CM | POA: Diagnosis present

## 2016-10-05 DIAGNOSIS — Z7982 Long term (current) use of aspirin: Secondary | ICD-10-CM

## 2016-10-05 DIAGNOSIS — J440 Chronic obstructive pulmonary disease with acute lower respiratory infection: Secondary | ICD-10-CM | POA: Diagnosis present

## 2016-10-05 DIAGNOSIS — J441 Chronic obstructive pulmonary disease with (acute) exacerbation: Secondary | ICD-10-CM | POA: Diagnosis present

## 2016-10-05 DIAGNOSIS — R0602 Shortness of breath: Secondary | ICD-10-CM | POA: Diagnosis not present

## 2016-10-05 DIAGNOSIS — I5022 Chronic systolic (congestive) heart failure: Secondary | ICD-10-CM | POA: Diagnosis present

## 2016-10-05 DIAGNOSIS — K5900 Constipation, unspecified: Secondary | ICD-10-CM | POA: Diagnosis present

## 2016-10-05 DIAGNOSIS — I160 Hypertensive urgency: Secondary | ICD-10-CM | POA: Diagnosis present

## 2016-10-05 DIAGNOSIS — A419 Sepsis, unspecified organism: Principal | ICD-10-CM | POA: Diagnosis present

## 2016-10-05 DIAGNOSIS — Z79899 Other long term (current) drug therapy: Secondary | ICD-10-CM | POA: Diagnosis not present

## 2016-10-05 DIAGNOSIS — Z7902 Long term (current) use of antithrombotics/antiplatelets: Secondary | ICD-10-CM | POA: Diagnosis not present

## 2016-10-05 DIAGNOSIS — Z9981 Dependence on supplemental oxygen: Secondary | ICD-10-CM | POA: Diagnosis not present

## 2016-10-05 LAB — INFLUENZA PANEL BY PCR (TYPE A & B)
INFLAPCR: NEGATIVE
Influenza B By PCR: NEGATIVE

## 2016-10-05 LAB — BASIC METABOLIC PANEL
Anion gap: 6 (ref 5–15)
BUN: 15 mg/dL (ref 6–20)
CALCIUM: 8.3 mg/dL — AB (ref 8.9–10.3)
CO2: 31 mmol/L (ref 22–32)
CREATININE: 0.92 mg/dL (ref 0.44–1.00)
Chloride: 96 mmol/L — ABNORMAL LOW (ref 101–111)
GFR calc non Af Amer: 57 mL/min — ABNORMAL LOW (ref >60)
Glucose, Bld: 192 mg/dL — ABNORMAL HIGH (ref 65–99)
Potassium: 3.5 mmol/L (ref 3.5–5.1)
SODIUM: 133 mmol/L — AB (ref 135–145)

## 2016-10-05 LAB — CBC WITH DIFFERENTIAL/PLATELET
BASOS PCT: 0 %
Basophils Absolute: 0.1 10*3/uL (ref 0–0.1)
EOS ABS: 0 10*3/uL (ref 0–0.7)
Eosinophils Relative: 0 %
HCT: 37.8 % (ref 35.0–47.0)
HEMOGLOBIN: 12.7 g/dL (ref 12.0–16.0)
Lymphocytes Relative: 7 %
Lymphs Abs: 1.3 10*3/uL (ref 1.0–3.6)
MCH: 34.3 pg — ABNORMAL HIGH (ref 26.0–34.0)
MCHC: 33.5 g/dL (ref 32.0–36.0)
MCV: 102.4 fL — ABNORMAL HIGH (ref 80.0–100.0)
Monocytes Absolute: 0.9 10*3/uL (ref 0.2–0.9)
Monocytes Relative: 4 %
NEUTROS PCT: 89 %
Neutro Abs: 18.1 10*3/uL — ABNORMAL HIGH (ref 1.4–6.5)
Platelets: 193 10*3/uL (ref 150–440)
RBC: 3.69 MIL/uL — AB (ref 3.80–5.20)
RDW: 14.1 % (ref 11.5–14.5)
WBC: 20.4 10*3/uL — AB (ref 3.6–11.0)

## 2016-10-05 LAB — LACTIC ACID, PLASMA
LACTIC ACID, VENOUS: 0.9 mmol/L (ref 0.5–1.9)
Lactic Acid, Venous: 1.1 mmol/L (ref 0.5–1.9)

## 2016-10-05 LAB — BRAIN NATRIURETIC PEPTIDE: B NATRIURETIC PEPTIDE 5: 85 pg/mL (ref 0.0–100.0)

## 2016-10-05 LAB — TROPONIN I

## 2016-10-05 MED ORDER — IPRATROPIUM-ALBUTEROL 0.5-2.5 (3) MG/3ML IN SOLN
3.0000 mL | Freq: Once | RESPIRATORY_TRACT | Status: AC
  Administered 2016-10-05: 3 mL via RESPIRATORY_TRACT
  Filled 2016-10-05: qty 3

## 2016-10-05 MED ORDER — MORPHINE SULFATE (PF) 4 MG/ML IV SOLN
4.0000 mg | Freq: Once | INTRAVENOUS | Status: AC
  Administered 2016-10-05: 4 mg via INTRAVENOUS

## 2016-10-05 MED ORDER — GUAIFENESIN-DM 100-10 MG/5ML PO SYRP: 5.0000 mL | ORAL_SOLUTION | ORAL | Status: DC | PRN

## 2016-10-05 MED ORDER — ACETAMINOPHEN 325 MG PO TABS: 650.0000 mg | ORAL_TABLET | Freq: Four times a day (QID) | ORAL | Status: DC | PRN

## 2016-10-05 MED ORDER — MOMETASONE FURO-FORMOTEROL FUM 100-5 MCG/ACT IN AERO
2.0000 | INHALATION_SPRAY | Freq: Two times a day (BID) | RESPIRATORY_TRACT | Status: DC
  Administered 2016-10-06 – 2016-10-10 (×10): 2 via RESPIRATORY_TRACT
  Filled 2016-10-05: qty 8.8

## 2016-10-05 MED ORDER — LATANOPROST 0.005 % OP SOLN
1.0000 [drp] | Freq: Every day | OPHTHALMIC | Status: DC
  Administered 2016-10-06 – 2016-10-09 (×5): 1 [drp] via OPHTHALMIC
  Filled 2016-10-05: qty 2.5

## 2016-10-05 MED ORDER — HYDROCODONE-ACETAMINOPHEN 5-325 MG PO TABS
1.0000 | ORAL_TABLET | Freq: Four times a day (QID) | ORAL | Status: DC | PRN
  Administered 2016-10-05 – 2016-10-09 (×7): 1 via ORAL
  Filled 2016-10-05 (×7): qty 1

## 2016-10-05 MED ORDER — ASPIRIN 81 MG PO CHEW
81.0000 mg | CHEWABLE_TABLET | Freq: Every day | ORAL | Status: DC
Start: 2016-10-06 — End: 2016-10-10
  Administered 2016-10-06 – 2016-10-10 (×5): 81 mg via ORAL
  Filled 2016-10-05 (×5): qty 1

## 2016-10-05 MED ORDER — TIZANIDINE HCL 2 MG PO TABS
2.0000 mg | ORAL_TABLET | Freq: Three times a day (TID) | ORAL | Status: DC | PRN
  Filled 2016-10-05: qty 1

## 2016-10-05 MED ORDER — MORPHINE SULFATE (PF) 4 MG/ML IV SOLN
INTRAVENOUS | Status: AC
  Administered 2016-10-05: 4 mg via INTRAVENOUS
  Filled 2016-10-05: qty 1

## 2016-10-05 MED ORDER — SODIUM CHLORIDE 0.9 % IV SOLN
INTRAVENOUS | Status: AC
  Administered 2016-10-05: 23:00:00 via INTRAVENOUS

## 2016-10-05 MED ORDER — ALPRAZOLAM 0.5 MG PO TABS
0.5000 mg | ORAL_TABLET | Freq: Two times a day (BID) | ORAL | Status: DC
  Administered 2016-10-06 – 2016-10-10 (×9): 0.5 mg via ORAL
  Filled 2016-10-05 (×9): qty 1

## 2016-10-05 MED ORDER — METHYLPREDNISOLONE SODIUM SUCC 125 MG IJ SOLR
60.0000 mg | Freq: Four times a day (QID) | INTRAMUSCULAR | Status: DC
  Administered 2016-10-05 – 2016-10-06 (×3): 60 mg via INTRAVENOUS
  Filled 2016-10-05 (×3): qty 2

## 2016-10-05 MED ORDER — CEFTRIAXONE SODIUM-DEXTROSE 1-3.74 GM-% IV SOLR
1.0000 g | INTRAVENOUS | Status: DC
  Administered 2016-10-06 – 2016-10-09 (×4): 1 g via INTRAVENOUS
  Filled 2016-10-05 (×4): qty 50

## 2016-10-05 MED ORDER — FLUOXETINE HCL 10 MG PO CAPS
20.0000 mg | ORAL_CAPSULE | Freq: Every day | ORAL | Status: DC
  Administered 2016-10-06 – 2016-10-10 (×5): 20 mg via ORAL
  Filled 2016-10-05 (×4): qty 2
  Filled 2016-10-05: qty 1
  Filled 2016-10-05: qty 2

## 2016-10-05 MED ORDER — DEXTROSE 5 % IV SOLN
500.0000 mg | INTRAVENOUS | Status: DC
  Administered 2016-10-06 – 2016-10-07 (×2): 500 mg via INTRAVENOUS
  Filled 2016-10-05 (×3): qty 500

## 2016-10-05 MED ORDER — ONDANSETRON HCL 4 MG PO TABS: 4.0000 mg | ORAL_TABLET | Freq: Four times a day (QID) | ORAL | Status: DC | PRN

## 2016-10-05 MED ORDER — LEVOFLOXACIN IN D5W 750 MG/150ML IV SOLN
750.0000 mg | Freq: Once | INTRAVENOUS | Status: AC
  Administered 2016-10-05: 750 mg via INTRAVENOUS
  Filled 2016-10-05: qty 150

## 2016-10-05 MED ORDER — TIOTROPIUM BROMIDE MONOHYDRATE 18 MCG IN CAPS
18.0000 ug | ORAL_CAPSULE | Freq: Every morning | RESPIRATORY_TRACT | Status: DC
  Administered 2016-10-06 – 2016-10-10 (×5): 18 ug via RESPIRATORY_TRACT
  Filled 2016-10-05 (×2): qty 5

## 2016-10-05 MED ORDER — LORAZEPAM 2 MG/ML IJ SOLN
INTRAMUSCULAR | Status: AC
  Administered 2016-10-05: 0.5 mg via INTRAVENOUS
  Filled 2016-10-05: qty 1

## 2016-10-05 MED ORDER — ENOXAPARIN SODIUM 40 MG/0.4ML ~~LOC~~ SOLN
40.0000 mg | SUBCUTANEOUS | Status: DC
  Administered 2016-10-06 – 2016-10-09 (×4): 40 mg via SUBCUTANEOUS
  Filled 2016-10-05 (×4): qty 0.4

## 2016-10-05 MED ORDER — BENZONATATE 100 MG PO CAPS
200.0000 mg | ORAL_CAPSULE | Freq: Three times a day (TID) | ORAL | Status: DC | PRN
  Administered 2016-10-06 – 2016-10-10 (×7): 200 mg via ORAL
  Filled 2016-10-05 (×7): qty 2

## 2016-10-05 MED ORDER — ONDANSETRON HCL 4 MG/2ML IJ SOLN: 4.0000 mg | Freq: Four times a day (QID) | INTRAMUSCULAR | Status: DC | PRN

## 2016-10-05 MED ORDER — ATORVASTATIN CALCIUM 20 MG PO TABS
10.0000 mg | ORAL_TABLET | Freq: Every evening | ORAL | Status: DC
  Administered 2016-10-06 – 2016-10-09 (×4): 10 mg via ORAL
  Filled 2016-10-05: qty 1
  Filled 2016-10-05: qty 2
  Filled 2016-10-05 (×2): qty 1

## 2016-10-05 MED ORDER — ACETAMINOPHEN 650 MG RE SUPP: 650.0000 mg | Freq: Four times a day (QID) | RECTAL | Status: DC | PRN

## 2016-10-05 MED ORDER — SODIUM CHLORIDE 0.9 % IV BOLUS (SEPSIS)
1000.0000 mL | Freq: Once | INTRAVENOUS | Status: AC
  Administered 2016-10-05: 1000 mL via INTRAVENOUS

## 2016-10-05 MED ORDER — TRAZODONE HCL 50 MG PO TABS
50.0000 mg | ORAL_TABLET | Freq: Every day | ORAL | Status: DC
  Administered 2016-10-05 – 2016-10-09 (×5): 50 mg via ORAL
  Filled 2016-10-05 (×5): qty 1

## 2016-10-05 MED ORDER — IPRATROPIUM-ALBUTEROL 0.5-2.5 (3) MG/3ML IN SOLN
3.0000 mL | RESPIRATORY_TRACT | Status: DC | PRN
  Administered 2016-10-08: 19:00:00 3 mL via RESPIRATORY_TRACT
  Filled 2016-10-05: qty 3

## 2016-10-05 MED ORDER — LORAZEPAM 2 MG/ML IJ SOLN
0.5000 mg | Freq: Once | INTRAMUSCULAR | Status: AC
Start: 2016-10-05 — End: 2016-10-05
  Administered 2016-10-05: 0.5 mg via INTRAVENOUS

## 2016-10-05 MED ORDER — PANTOPRAZOLE SODIUM 40 MG PO TBEC
40.0000 mg | DELAYED_RELEASE_TABLET | ORAL | Status: DC
  Administered 2016-10-06 – 2016-10-10 (×5): 40 mg via ORAL
  Filled 2016-10-05 (×5): qty 1

## 2016-10-05 MED ORDER — CLOPIDOGREL BISULFATE 75 MG PO TABS
75.0000 mg | ORAL_TABLET | ORAL | Status: DC
  Administered 2016-10-06 – 2016-10-10 (×5): 75 mg via ORAL
  Filled 2016-10-05 (×5): qty 1

## 2016-10-05 NOTE — H&P (Signed)
[facility] [place] NAME: Debra [name]    MR#:  [id number]  DATE OF BIRTH:  Jan 30, 1933  DATE OF ADMISSION:  10/05/2016  PRIMARY CARE PHYSICIAN: Volanda Napoleon, MD   REQUESTING/REFERRING PHYSICIAN: Jimmye Norman, MD  CHIEF COMPLAINT:   Chief Complaint  Patient presents with  . Respiratory Distress  . Shortness of Breath    HISTORY OF PRESENT ILLNESS:  Debra [name]  is a 83 y.o. female who presents with SOB for the past several days.  Today she developed right sided pleurodynia.  Here in the ED she was found to have Pneumonia. Hospitalists were called for admission  PAST MEDICAL HISTORY:   Past Medical History:  Diagnosis Date  . Asthma   . COPD (chronic obstructive pulmonary disease) ([place])   . Myocardial infarction     PAST SURGICAL HISTORY:   Past Surgical History:  Procedure Laterality Date  . ABDOMINAL HYSTERECTOMY    . BREAST SURGERY    . BYPASS GRAFT  2007   triple  . CHOLECYSTECTOMY    . TONSILLECTOMY      SOCIAL HISTORY:   Social History  Substance Use Topics  . Smoking status: Former Smoker    Packs/day: 0.50    Years: 55.00  . Smokeless tobacco: Never Used     Comment: quit smoking 06-08-16  . Alcohol use No    FAMILY HISTORY:   Family History  Problem Relation Age of Onset  . CAD Father     DRUG ALLERGIES:   Allergies  Allergen Reactions  . Codeine Itching and Rash    MEDICATIONS AT HOME:   Prior to Admission medications   Medication Sig Start Date End Date Taking? Authorizing Provider  albuterol (PROVENTIL HFA;VENTOLIN HFA) 108 (90 Base) MCG/ACT inhaler Inhale 2 puffs into the lungs every 6 (six) hours as needed for wheezing or shortness of breath.   Yes Historical Provider, MD  albuterol (PROVENTIL) (2.5 MG/3ML) 0.083% nebulizer solution Take 2.5 mg by nebulization 4 (four) times daily as needed for wheezing or shortness of breath.   Yes Historical Provider, MD  ALPRAZolam  Duanne Moron) 0.5 MG tablet Take 1 tablet (0.5 mg total) by mouth 2 (two) times daily. 06/15/16  Yes Bettey Costa, MD  aspirin 81 MG chewable tablet Chew by mouth every morning.   Yes Historical Provider, MD  atorvastatin (LIPITOR) 10 MG tablet Take 10 mg by mouth every evening. 05/16/16  Yes Historical Provider, MD  Calcium Carbonate-Vitamin D (OYSTERCAL 500 + D PO) Take 1 tablet by mouth 2 (two) times daily.   Yes Historical Provider, MD  clopidogrel (PLAVIX) 75 MG tablet Take 75 mg by mouth every morning.    Yes Historical Provider, MD  diclofenac sodium (VOLTAREN) 1 % GEL Apply 2 g topically 2 (two) times daily as needed (for pain).   Yes Historical Provider, MD  docusate sodium (COLACE) 50 MG capsule Take 50 mg by mouth 2 (two) times daily.   Yes Historical Provider, MD  fexofenadine (ALLEGRA) 180 MG tablet Take 180 mg by mouth daily.   Yes Historical Provider, MD  FLUoxetine (PROZAC) 20 MG capsule Take 1 capsule (20 mg total) by mouth daily. 09/01/15  Yes Aldean Jewett, MD  HYDROcodone-acetaminophen (NORCO/VICODIN) 5-325 MG tablet Take 1 tablet by mouth every 6 (six) hours as needed for moderate pain. 06/15/16  Yes Sital Mody, MD  latanoprost (XALATAN) 0.005 % ophthalmic solution Place 1 drop into both eyes at bedtime. 05/10/16  Yes  Historical Provider, MD  losartan (COZAAR) 25 MG tablet Take 25 mg by mouth daily.   Yes Historical Provider, MD  pantoprazole (PROTONIX) 40 MG tablet Take 40 mg by mouth every morning.    Yes Historical Provider, MD  tiotropium (SPIRIVA HANDIHALER) 18 MCG inhalation capsule Place 1 capsule (18 mcg total) into inhaler and inhale every morning. 09/01/15  Yes Aldean Jewett, MD  tiZANidine (ZANAFLEX) 2 MG tablet Take 2 mg by mouth 3 (three) times daily as needed for muscle spasms.    Yes Historical Provider, MD  traZODone (DESYREL) 50 MG tablet Take 50 mg by mouth at bedtime.    Yes Historical Provider, MD  furosemide (LASIX) 40 MG tablet Take 1 tablet (40 mg total) by  mouth daily. 06/15/16   Sital Mody, MD  mometasone-formoterol (DULERA) 100-5 MCG/ACT AERO Inhale 2 puffs into the lungs 2 (two) times daily. 09/01/15   Aldean Jewett, MD    REVIEW OF SYSTEMS:  Review of Systems  Constitutional: Positive for chills. Negative for fever, malaise/fatigue and weight loss.  HENT: Negative for ear pain, hearing loss and tinnitus.   Eyes: Negative for blurred vision, double vision, pain and redness.  Respiratory: Positive for cough and shortness of breath. Negative for hemoptysis.   Cardiovascular: Positive for chest pain (right-sided low). Negative for palpitations, orthopnea and leg swelling.  Gastrointestinal: Negative for abdominal pain, constipation, diarrhea, nausea and vomiting.  Genitourinary: Negative for dysuria, frequency and hematuria.  Musculoskeletal: Negative for back pain, joint pain and neck pain.  Skin:       No acne, rash, or lesions  Neurological: Negative for dizziness, tremors, focal weakness and weakness.  Endo/Heme/Allergies: Negative for polydipsia. Does not bruise/bleed easily.  Psychiatric/Behavioral: Negative for depression. The patient is not nervous/anxious and does not have insomnia.      VITAL SIGNS:   Vitals:   10/05/16 2010 10/05/16 2015 10/05/16 2030 10/05/16 2100  BP:    107/73  Pulse:   99 95  Resp:   (!) 27 18  Temp:      SpO2: (!) 88% 96% 94% 100%  Weight:       Wt Readings from Last 3 Encounters:  10/05/16 73.5 kg (162 lb)  06/28/16 73.5 kg (162 lb)  06/22/16 73.5 kg (162 lb)    PHYSICAL EXAMINATION:  Physical Exam  Vitals reviewed. Constitutional: She is oriented to person, place, and time. She appears well-developed and well-nourished. No distress.  HENT:  Head: Normocephalic and atraumatic.  Mouth/Throat: Oropharynx is clear and moist.  Eyes: Conjunctivae and EOM are normal. Pupils are equal, round, and reactive to light. No scleral icterus.  Neck: Normal range of motion. Neck supple. No JVD present.  No thyromegaly present.  Cardiovascular: Normal rate, regular rhythm and intact distal pulses.  Exam reveals no gallop and no friction rub.   No murmur heard. Respiratory: Effort normal. No respiratory distress. She has no wheezes. She has no rales.  Right lower lobe rhonchi  GI: Soft. Bowel sounds are normal. She exhibits no distension. There is no tenderness.  Musculoskeletal: Normal range of motion. She exhibits no edema.  No arthritis, no gout  Lymphadenopathy:    She has no cervical adenopathy.  Neurological: She is alert and oriented to person, place, and time. No cranial nerve deficit.  No dysarthria, no aphasia  Skin: Skin is warm and dry. No rash noted. No erythema.  Psychiatric: She has a normal mood and affect. Her behavior is normal. Judgment and thought content  normal.    LABORATORY PANEL:   CBC  Recent Labs Lab 10/05/16 1801  WBC 20.4*  HGB 12.7  HCT 37.8  PLT 193   ------------------------------------------------------------------------------------------------------------------  Chemistries   Recent Labs Lab 10/05/16 1911  NA 133*  K 3.5  CL 96*  CO2 31  GLUCOSE 192*  BUN 15  CREATININE 0.92  CALCIUM 8.3*   ------------------------------------------------------------------------------------------------------------------  Cardiac Enzymes  Recent Labs Lab 10/05/16 1911  TROPONINI <0.03   ------------------------------------------------------------------------------------------------------------------  RADIOLOGY:  Dg Chest Port 1 View  Result Date: 10/05/2016 CLINICAL DATA:  Patient brought in by [facility] from home for Respiratory distress. Patient reports shortness of breath that started this morning. EXAM: PORTABLE CHEST 1 VIEW COMPARISON:  06/14/2016 FINDINGS: Prior CABG. Atherosclerotic calcification of the aortic arch. Mild cardiomegaly with diffuse interstitial accentuation, much of which is chronic, but a component of superimposed interstitial  edema is suspected and the appearance is worsened from 06/14/2016. No blunting of the costophrenic angles identified. Early airspace opacity at the right lung base. IMPRESSION: 1. Mild enlargement of the cardiopericardial silhouette with interstitial edema superimposed on chronic interstitial lung disease. There is early airspace opacity at the right lung base which could be from confluent edema or pneumonia. 2.  Atherosclerotic calcification of the aortic arch. Electronically Signed   By: Van Clines M.D.   On: 10/05/2016 18:28    EKG:   Orders placed or performed during the hospital encounter of 10/05/16  . ED EKG  . ED EKG    IMPRESSION AND PLAN:  Principal Problem:   Sepsis ([place]) - broad spectrum antibiotics started in the ED and continued on admission. Patient is hemodynamically stable, lactic acid was within normal limits. Cultures were sent from the ED. Active Problems:   COPD exacerbation ([place]) - IV steroids in addition to antibiotics, when necessary DuoNeb's, when necessary antitussive   CAP (community acquired pneumonia) - antibiotics and cultures with when necessary symptomatic management as above   Chronic systolic heart failure ([place]) - continue home meds  All the records are reviewed and case discussed with ED provider. Management plans discussed with the patient and/or family.  DVT PROPHYLAXIS: SubQ lovenox  GI PROPHYLAXIS: PPI  ADMISSION STATUS: Inpatient  CODE STATUS: Full Code Status History    Date Active Date Inactive Code Status Order ID Comments User Context   06/10/2016 10:32 AM 06/10/2016 10:50 AM Full Code [id number]  Theodoro Grist, MD Inpatient   09/14/2015  8:19 PM 09/16/2015  8:34 PM Full Code [id number]  Theodoro Grist, MD Inpatient   08/27/2015  8:05 AM 09/01/2015  8:42 PM Full Code [id number]  Harrie Foreman, MD Inpatient      TOTAL TIME TAKING CARE OF THIS PATIENT: 45 minutes.    Lorelei Heikkila [place] 10/05/2016, 9:20 PM  [employer]  Hospitalists  Office  [phone]  CC: Primary care physician; Volanda Napoleon, MD

## 2016-10-05 NOTE — ED Notes (Signed)
Patient transported to room 119

## 2016-10-05 NOTE — ED Notes (Signed)
This RN noticed pt's O2 sats dropping to 84-85% on 2L [place]; O2 increased to 4L on [place], pt's O2 sats improved to 87-88%; O2 increased to 6L [place]; pt's O2 sats increased to 96-97%

## 2016-10-05 NOTE — ED Notes (Signed)
RN in room to administer patient abx. BP noted to be 80/59, blood pressure rechecked at 76/61. Normal saline bolus started, patient placed in mid trendelenburg position.   Dr. Jimmye Norman notified.

## 2016-10-05 NOTE — Progress Notes (Signed)
Pharmacy Antibiotic Note  Debra [name] is a 83 y.o. female admitted on 10/05/2016 with pneumonia.  Pharmacy has been consulted for ceftriaxone dosing.  Plan: Ceftriaxone 1 gram q 24 hours ordered.  Height: '5\' 1"'$  (154.9 cm) Weight: 167 lb 1.6 oz (75.8 kg) IBW/kg (Calculated) : 47.8  Temp (24hrs), Avg:98.5 F (36.9 C), Min:98.2 F (36.8 C), Max:99 F (37.2 C)   Recent Labs Lab 10/05/16 1801 10/05/16 1911 10/05/16 1943 10/05/16 2247  WBC 20.4*  --   --   --   CREATININE  --  0.92  --   --   LATICACIDVEN  --   --  0.9 1.1    Estimated Creatinine Clearance: 44.7 mL/min (by C-G formula based on SCr of 0.92 mg/dL).    Allergies  Allergen Reactions  . Codeine Itching and Rash    Antimicrobials this admission: ceftriaxone 1/27 >>  azithromycin 1/26 >>   Dose adjustments this admission:   Microbiology results: 1/26 Sputum: pending  06/10/16 MRSA PCR: (-)     1/26 CXR: edema vs. pneumonia  Thank you for allowing pharmacy to be a part of this patient's care.  Reign Bartnick S 10/05/2016 11:34 PM

## 2016-10-05 NOTE — ED Notes (Signed)
Called Respiratory per Dr. Jimmye Norman to see how well patient does off Bi-pap.

## 2016-10-05 NOTE — ED Provider Notes (Addendum)
[facility] Emergency Department Provider Note        Time seen: ----------------------------------------- 5:52 PM on 10/05/2016 -----------------------------------------    I have reviewed the triage vital signs and the nursing notes.   HISTORY  Chief Complaint No chief complaint on file.    HPI Debra [name] is a 83 y.o. female who presents to the ER for shortness of breath. Patient states she began getting very short of breath today. She has not had any recent illness, states she is her rescue inhaler at home without any improvement. Patient used to do nebs and Solu-Medrol prior to arrival given by EMS. She denies fevers, chills, chest pain, vomiting or diarrhea.   Past Medical History:  Diagnosis Date  . Asthma   . COPD (chronic obstructive pulmonary disease) ([place])   . Myocardial infarction     Patient Active Problem List   Diagnosis Date Noted  . Chronic systolic heart failure ([place]) 06/28/2016  . COPD (chronic obstructive pulmonary disease) with emphysema ([place]) 06/28/2016  . Tobacco use 06/28/2016  . Acidosis 09/14/2015  . Hypotension 09/14/2015  . Hypokalemia 09/14/2015  . COPD exacerbation ([place]) 09/01/2015  . Sepsis ([place]) 08/27/2015    Past Surgical History:  Procedure Laterality Date  . ABDOMINAL HYSTERECTOMY    . BREAST SURGERY    . BYPASS GRAFT  2007   triple  . CHOLECYSTECTOMY    . TONSILLECTOMY      Allergies Codeine  Social History Social History  Substance Use Topics  . Smoking status: Former Smoker    Packs/day: 0.50    Years: 55.00  . Smokeless tobacco: Never Used     Comment: quit smoking 06-08-16  . Alcohol use No    Review of Systems Constitutional: Negative for fever. Cardiovascular: Negative for chest pain. Respiratory: Positive shortness of breath Gastrointestinal: Negative for abdominal pain, vomiting and diarrhea. Genitourinary: Negative for dysuria. Musculoskeletal: Negative for back  pain. Skin: Negative for rash. Neurological: Negative for headaches, focal weakness or numbness.  10-point ROS otherwise negative.  ____________________________________________   PHYSICAL EXAM:  VITAL SIGNS: ED Triage Vitals  Enc Vitals Group     BP      Pulse      Resp      Temp      Temp src      SpO2      Weight      Height      Head Circumference      Peak Flow      Pain Score      Pain Loc      Pain Edu?      Excl. in [place]?     Constitutional: Alert and oriented. Mild to moderate distress Eyes: Conjunctivae are normal. Normal extraocular movements. ENT   Head: Normocephalic and atraumatic.   Nose: No congestion/rhinnorhea.   Mouth/Throat: Mucous membranes are moist.   Neck: No stridor. Cardiovascular: Normal rate, regular rhythm. No murmurs, rubs, or gallops. Respiratory: Tachypnea with diminished breath sounds and wheezing bilaterally. Significant retractions are noted Gastrointestinal: Soft and nontender. Normal bowel sounds Musculoskeletal: Nontender with normal range of motion in all extremities. No lower extremity tenderness nor edema. Neurologic:  Normal speech and language. No gross focal neurologic deficits are appreciated.  Skin:  Skin is warm, dry and intact. No rash noted. Psychiatric: Mood and affect are normal. Speech and behavior are normal.  ____________________________________________  EKG: Interpreted by me. Sinus tachycardia, Normal PR interval, normal QRS size, normal QT, normal axis.  ____________________________________________  ED COURSE:  Pertinent labs & imaging results that were available during my care of the patient were reviewed by me and considered in my medical decision making (see chart for details). She presents to the ER with dyspnea and significant COPD exacerbation likely. She'll be placed on BiPAP, we will check basic labs and imaging.   Procedures ____________________________________________   LABS  (pertinent positives/negatives)  Labs Reviewed  CBC WITH DIFFERENTIAL/PLATELET - Abnormal; Notable for the following:       Result Value   WBC 20.4 (*)    RBC 3.69 (*)    MCV 102.4 (*)    MCH 34.3 (*)    Neutro Abs 18.1 (*)    All other components within normal limits  BLOOD GAS, VENOUS - Abnormal; Notable for the following:    Bicarbonate 32.6 (*)    Acid-Base Excess 5.2 (*)    All other components within normal limits  BASIC METABOLIC PANEL - Abnormal; Notable for the following:    Sodium 133 (*)    Chloride 96 (*)    Glucose, Bld 192 (*)    Calcium 8.3 (*)    GFR calc non Af Amer 57 (*)    All other components within normal limits  BRAIN NATRIURETIC PEPTIDE  INFLUENZA PANEL BY PCR (TYPE A & B)  TROPONIN I  LACTIC ACID, PLASMA  LACTIC ACID, PLASMA  CRITICAL CARE Performed by: Earleen Newport   Total critical care time: 30 minutes  Critical care time was exclusive of separately billable procedures and treating other patients.  Critical care was necessary to treat or prevent imminent or life-threatening deterioration.  Critical care was time spent personally by me on the following activities: development of treatment plan with patient and/or surrogate as well as nursing, discussions with consultants, evaluation of patient's response to treatment, examination of patient, obtaining history from patient or surrogate, ordering and performing treatments and interventions, ordering and review of laboratory studies, ordering and review of radiographic studies, pulse oximetry and re-evaluation of patient's condition.   RADIOLOGY Images were viewed by me  Chest x-ray IMPRESSION: 1. Mild enlargement of the cardiopericardial silhouette with interstitial edema superimposed on chronic interstitial lung disease. There is early airspace opacity at the right lung base which could be from confluent edema or pneumonia. 2. Atherosclerotic calcification of the aortic  arch. ____________________________________________  FINAL ASSESSMENT AND PLAN  COPD exacerbation, acute on chronic respiratory failure, Pneumonia  Plan: Patient with labs and imaging as dictated above. Patient was started on IV Levaquin for likely early pneumonia. Leukocytosis is consistent with same. She tolerated BiPAP well, and she was given saline infusion as well. After 2 hours we were able to take her off of BiPAP and she seemed to be improving. I will discuss with the hospitalist for admission.   Earleen Newport, MD   Note: This note was generated in part or whole with voice recognition software. Voice recognition is usually quite accurate but there are transcription errors that can and very often do occur. I apologize for any typographical errors that were not detected and corrected.     Earleen Newport, MD 10/05/16 1934    Earleen Newport, MD 10/05/16 2008

## 2016-10-05 NOTE — ED Triage Notes (Signed)
Patient brought in by [facility] from home for Respiratory distress. Patient reports shortness of breath that started this morning. Patient received 2 duoneb treatments and Solumedrol 125 mg in route from EMS Patient with diminished breath sounds and wheezing and increased work of breathing.

## 2016-10-06 LAB — CBC
HCT: 34.2 % — ABNORMAL LOW (ref 35.0–47.0)
Hemoglobin: 11.6 g/dL — ABNORMAL LOW (ref 12.0–16.0)
MCH: 34.5 pg — ABNORMAL HIGH (ref 26.0–34.0)
MCHC: 33.9 g/dL (ref 32.0–36.0)
MCV: 101.6 fL — ABNORMAL HIGH (ref 80.0–100.0)
PLATELETS: 176 10*3/uL (ref 150–440)
RBC: 3.37 MIL/uL — AB (ref 3.80–5.20)
RDW: 14.1 % (ref 11.5–14.5)
WBC: 16.7 10*3/uL — AB (ref 3.6–11.0)

## 2016-10-06 LAB — EXPECTORATED SPUTUM ASSESSMENT W GRAM STAIN, RFLX TO RESP C

## 2016-10-06 LAB — BASIC METABOLIC PANEL
Anion gap: 4 — ABNORMAL LOW (ref 5–15)
BUN: 16 mg/dL (ref 6–20)
CHLORIDE: 103 mmol/L (ref 101–111)
CO2: 31 mmol/L (ref 22–32)
CREATININE: 0.87 mg/dL (ref 0.44–1.00)
Calcium: 8.3 mg/dL — ABNORMAL LOW (ref 8.9–10.3)
Glucose, Bld: 167 mg/dL — ABNORMAL HIGH (ref 65–99)
POTASSIUM: 4.1 mmol/L (ref 3.5–5.1)
SODIUM: 138 mmol/L (ref 135–145)

## 2016-10-06 LAB — EXPECTORATED SPUTUM ASSESSMENT W REFEX TO RESP CULTURE

## 2016-10-06 MED ORDER — METHYLPREDNISOLONE SODIUM SUCC 40 MG IJ SOLR
40.0000 mg | Freq: Three times a day (TID) | INTRAMUSCULAR | Status: DC
  Administered 2016-10-06 – 2016-10-07 (×2): 40 mg via INTRAVENOUS
  Filled 2016-10-06 (×2): qty 1

## 2016-10-06 NOTE — Progress Notes (Addendum)
[place] at [place] NAME: Debra [name]    MR#:  [id number]  DATE OF BIRTH:  10/18/1932  SUBJECTIVE:  CHIEF COMPLAINT:  Patient's shortness of breath is better still coughing  REVIEW OF SYSTEMS:  CONSTITUTIONAL: No fever, fatigue or weakness.  EYES: No blurred or double vision.  EARS, NOSE, AND THROAT: No tinnitus or ear pain.  RESPIRATORY: Reports cough, improving shortness of breath, denies wheezing or hemoptysis.  CARDIOVASCULAR: No chest pain, orthopnea, edema.  GASTROINTESTINAL: No nausea, vomiting, diarrhea or abdominal pain.  GENITOURINARY: No dysuria, hematuria.  ENDOCRINE: No polyuria, nocturia,  HEMATOLOGY: No anemia, easy bruising or bleeding SKIN: No rash or lesion. MUSCULOSKELETAL: No joint pain or arthritis.   NEUROLOGIC: No tingling, numbness, weakness.  PSYCHIATRY: No anxiety or depression.   DRUG ALLERGIES:   Allergies  Allergen Reactions  . Codeine Itching and Rash    VITALS:  Blood pressure (!) 141/57, pulse 89, temperature 97.9 F (36.6 C), temperature source Oral, resp. rate 20, height '5\' 1"'$  (1.549 m), weight 76 kg (167 lb 9.6 oz), SpO2 92 %.  PHYSICAL EXAMINATION:  GENERAL:  83 y.o.-year-old patient lying in the bed with no acute distress.  EYES: Pupils equal, round, reactive to light and accommodation. No scleral icterus. Extraocular muscles intact.  HEENT: Head atraumatic, normocephalic. Oropharynx and nasopharynx clear.  NECK:  Supple, no jugular venous distention. No thyroid enlargement, no tenderness.  LUNGS: Moderate breath sounds bilaterally, no wheezing, rales,rhonchi or crepitation. No use of accessory muscles of respiration.  CARDIOVASCULAR: S1, S2 normal. No murmurs, rubs, or gallops.  ABDOMEN: Soft, nontender, nondistended. Bowel sounds present. No organomegaly or mass.  EXTREMITIES: No pedal edema, cyanosis, or clubbing.  NEUROLOGIC: Cranial nerves II through XII are intact. Muscle  strength 5/5 in all extremities. Sensation intact. Gait not checked.  PSYCHIATRIC: The patient is alert and oriented x 3.  SKIN: No obvious rash, lesion, or ulcer.    LABORATORY PANEL:   CBC  Recent Labs Lab 10/06/16 0521  WBC 16.7*  HGB 11.6*  HCT 34.2*  PLT 176   ------------------------------------------------------------------------------------------------------------------  Chemistries   Recent Labs Lab 10/06/16 0521  NA 138  K 4.1  CL 103  CO2 31  GLUCOSE 167*  BUN 16  CREATININE 0.87  CALCIUM 8.3*   ------------------------------------------------------------------------------------------------------------------  Cardiac Enzymes  Recent Labs Lab 10/05/16 1911  TROPONINI <0.03   ------------------------------------------------------------------------------------------------------------------  RADIOLOGY:  Dg Chest Port 1 View  Result Date: 10/05/2016 CLINICAL DATA:  Patient brought in by [facility] from home for Respiratory distress. Patient reports shortness of breath that started this morning. EXAM: PORTABLE CHEST 1 VIEW COMPARISON:  06/14/2016 FINDINGS: Prior CABG. Atherosclerotic calcification of the aortic arch. Mild cardiomegaly with diffuse interstitial accentuation, much of which is chronic, but a component of superimposed interstitial edema is suspected and the appearance is worsened from 06/14/2016. No blunting of the costophrenic angles identified. Early airspace opacity at the right lung base. IMPRESSION: 1. Mild enlargement of the cardiopericardial silhouette with interstitial edema superimposed on chronic interstitial lung disease. There is early airspace opacity at the right lung base which could be from confluent edema or pneumonia. 2.  Atherosclerotic calcification of the aortic arch. Electronically Signed   By: Van Clines M.D.   On: 10/05/2016 18:28    EKG:   Orders placed or performed during the hospital encounter of 08/26/15  . EKG  12-Lead  . EKG 12-Lead  . ED EKG  . ED EKG  . EKG  ASSESSMENT AND PLAN:    Sepsis ([place]) - Secondary to pneumonia Clinically feeling better Continue IV antibiotic Rocephin and azithromycin Hemodynamically stable   #Acute COPD exacerbation with acute on chronic hypoxic respiratory failure, lives on 2 L of home oxygen Currently on 5 L of home oxygen Shortness of breath is better. Continue IV steroids, IV antibiotics and nebulizer treatments  #Community acquired pneumonia. Continue IV Rocephin and azithromycin. Check culture and sensitivity of sputum   #  Chronic systolic heart failure ([place]) -  Not fluid overloaded  continue home meds aspirin, Plavix, Lipitor       All the records are reviewed and case discussed with Care Management/Social Workerr. Management plans discussed with the patient, family and they are in agreement.  CODE STATUS: fc  TOTAL TIME TAKING CARE OF THIS PATIENT: 36 minutes.   POSSIBLE D/C IN  2 DAYS, DEPENDING ON CLINICAL CONDITION.  Note: This dictation was prepared with Dragon dictation along with smaller phrase technology. Any transcriptional errors that result from this process are unintentional.   Nicholes Mango M.D on September 28, 202018 at 3:58 PM  Between 7am to 6pm - Pager - [phone] After 6pm go to [email or web address] - password [email or web address] [facility]  Office  [phone]  CC: Primary care physician; Volanda Napoleon, MD

## 2016-10-07 LAB — BLOOD GAS, VENOUS
ACID-BASE EXCESS: 5.2 mmol/L — AB (ref 0.0–2.0)
BICARBONATE: 32.6 mmol/L — AB (ref 20.0–28.0)
Delivery systems: POSITIVE
FIO2: 0.4
PATIENT TEMPERATURE: 37
pCO2, Ven: 59 mmHg (ref 44.0–60.0)
pH, Ven: 7.35 (ref 7.250–7.430)

## 2016-10-07 MED ORDER — METHYLPREDNISOLONE SODIUM SUCC 40 MG IJ SOLR
40.0000 mg | Freq: Two times a day (BID) | INTRAMUSCULAR | Status: DC
  Administered 2016-10-07 – 2016-10-10 (×6): 40 mg via INTRAVENOUS
  Filled 2016-10-07 (×6): qty 1

## 2016-10-07 MED ORDER — ORAL CARE MOUTH RINSE
15.0000 mL | Freq: Two times a day (BID) | OROMUCOSAL | Status: DC
  Administered 2016-10-08 – 2016-10-10 (×5): 15 mL via OROMUCOSAL

## 2016-10-07 NOTE — Progress Notes (Signed)
[place] at [place] NAME: Debra [name]    MR#:  [id number]  DATE OF BIRTH:  [birth year]  SUBJECTIVE:  CHIEF COMPLAINT:  Patient's shortness of breath And cough are better. Feeling weak and tired chronically lives on 5 L of oxygen at home  REVIEW OF SYSTEMS:  CONSTITUTIONAL: No fever, fatigue or weakness.  EYES: No blurred or double vision.  EARS, NOSE, AND THROAT: No tinnitus or ear pain.  RESPIRATORY: Reports cough, improving shortness of breath, denies wheezing or hemoptysis.  CARDIOVASCULAR: No chest pain, orthopnea, edema.  GASTROINTESTINAL: No nausea, vomiting, diarrhea or abdominal pain.  GENITOURINARY: No dysuria, hematuria.  ENDOCRINE: No polyuria, nocturia,  HEMATOLOGY: No anemia, easy bruising or bleeding SKIN: No rash or lesion. MUSCULOSKELETAL: No joint pain or arthritis.   NEUROLOGIC: No tingling, numbness, weakness.  PSYCHIATRY: No anxiety or depression.   DRUG ALLERGIES:   Allergies  Allergen Reactions  . Codeine Itching and Rash    VITALS:  Blood pressure (!) 156/74, pulse 80, temperature 97.9 F (36.6 C), temperature source Oral, resp. rate 17, height '5\' 1"'$  (1.549 m), weight 69.9 kg (154 lb 1.6 oz), SpO2 95 %.  PHYSICAL EXAMINATION:  GENERAL:  83 y.o.-year-old patient lying in the bed with no acute distress.  EYES: Pupils equal, round, reactive to light and accommodation. No scleral icterus. Extraocular muscles intact.  HEENT: Head atraumatic, normocephalic. Oropharynx and nasopharynx clear.  NECK:  Supple, no jugular venous distention. No thyroid enlargement, no tenderness.  LUNGS: Moderate breath sounds bilaterally, no wheezing, rales,rhonchi or crepitation. No use of accessory muscles of respiration.  CARDIOVASCULAR: S1, S2 normal. No murmurs, rubs, or gallops.  ABDOMEN: Soft, nontender, nondistended. Bowel sounds present. No organomegaly or mass.  EXTREMITIES: No pedal edema, cyanosis, or clubbing.   NEUROLOGIC: Cranial nerves II through XII are intact. Muscle strength 5/5 in all extremities. Sensation intact. Gait not checked.  PSYCHIATRIC: The patient is alert and oriented x 3.  SKIN: No obvious rash, lesion, or ulcer.    LABORATORY PANEL:   CBC  Recent Labs Lab 10/06/16 0521  WBC 16.7*  HGB 11.6*  HCT 34.2*  PLT 176   ------------------------------------------------------------------------------------------------------------------  Chemistries   Recent Labs Lab 10/06/16 0521  NA 138  K 4.1  CL 103  CO2 31  GLUCOSE 167*  BUN 16  CREATININE 0.87  CALCIUM 8.3*   ------------------------------------------------------------------------------------------------------------------  Cardiac Enzymes  Recent Labs Lab 10/05/16 1911  TROPONINI <0.03   ------------------------------------------------------------------------------------------------------------------  RADIOLOGY:  Dg Chest Port 1 View  Result Date: 10/05/2016 CLINICAL DATA:  Patient brought in by [facility] from home for Respiratory distress. Patient reports shortness of breath that started this morning. EXAM: PORTABLE CHEST 1 VIEW COMPARISON:  06/14/2016 FINDINGS: Prior CABG. Atherosclerotic calcification of the aortic arch. Mild cardiomegaly with diffuse interstitial accentuation, much of which is chronic, but a component of superimposed interstitial edema is suspected and the appearance is worsened from 06/14/2016. No blunting of the costophrenic angles identified. Early airspace opacity at the right lung base. IMPRESSION: 1. Mild enlargement of the cardiopericardial silhouette with interstitial edema superimposed on chronic interstitial lung disease. There is early airspace opacity at the right lung base which could be from confluent edema or pneumonia. 2.  Atherosclerotic calcification of the aortic arch. Electronically Signed   By: Van Clines M.D.   On: 10/05/2016 18:28    EKG:   Orders placed or  performed during the hospital encounter of 08/26/15  . EKG 12-Lead  .  EKG 12-Lead  . ED EKG  . ED EKG  . EKG    ASSESSMENT AND PLAN:   # Sepsis ([place]) - Secondary to pneumonia Clinically feeling better Continue IV antibiotic Rocephin and azithromycin Hemodynamically stable   #Acute COPD exacerbation with acute on chronic hypoxic respiratory failure, lives on 2 L of home oxygen Currently on 5 L of home oxygen and wean off as tolerated to 2 L Shortness of breath is better. Chest PT and incentive spirometry Tapering IV steroids, IV antibiotics and nebulizer treatments  #Community acquired pneumonia. Continue IV Rocephin and azithromycin. Sputum cultures with gram-positive cocci inpatient chains , re-incubated MRSA PCR is negative from October 2017   #  Chronic systolic heart failure ([place]) -  Not fluid overloaded  continue home meds aspirin, Plavix, Lipitor       All the records are reviewed and case discussed with Care Management/Social Workerr. Management plans discussed with the patient, family and they are in agreement.  CODE STATUS: fc  TOTAL TIME TAKING CARE OF THIS PATIENT: 36 minutes.   POSSIBLE D/C IN  2 DAYS, DEPENDING ON CLINICAL CONDITION.  Note: This dictation was prepared with Dragon dictation along with smaller phrase technology. Any transcriptional errors that result from this process are unintentional.   Nicholes Mango M.D on 10/07/2016 at 1:47 PM  Between 7am to 6pm - Pager - [phone] After 6pm go to [email or web address] - password [email or web address] [facility]  Office  [phone]  CC: Primary care physician; Volanda Napoleon, MD

## 2016-10-08 ENCOUNTER — Inpatient Hospital Stay: Payer: Medicare Other

## 2016-10-08 LAB — CULTURE, RESPIRATORY W GRAM STAIN

## 2016-10-08 LAB — CULTURE, RESPIRATORY

## 2016-10-08 MED ORDER — AZITHROMYCIN 500 MG PO TABS
500.0000 mg | ORAL_TABLET | Freq: Every day | ORAL | Status: DC
  Administered 2016-10-08: 500 mg via ORAL
  Filled 2016-10-08: qty 1

## 2016-10-08 MED ORDER — LABETALOL HCL 5 MG/ML IV SOLN
10.0000 mg | INTRAVENOUS | Status: DC | PRN
  Administered 2016-10-09 (×2): 10 mg via INTRAVENOUS
  Filled 2016-10-08 (×3): qty 4

## 2016-10-08 NOTE — Progress Notes (Signed)
[place] at [place] NAME: Debra [name]    MR#:  [id number]  DATE OF BIRTH:  1933/03/01  SUBJECTIVE:  CHIEF COMPLAINT:  Patient's shortness of breath And cough are still present but better. Feeling weak and tired chronically lives on 2 L of oxygen at [facility] requiring 5-6 L with ambulation  REVIEW OF SYSTEMS:  CONSTITUTIONAL: No fever, fatigue or weakness.  EYES: No blurred or double vision.  EARS, NOSE, AND THROAT: No tinnitus or ear pain.  RESPIRATORY: Reports cough, improving shortness of breath, denies wheezing or hemoptysis.  CARDIOVASCULAR: No chest pain, orthopnea, edema.  GASTROINTESTINAL: No nausea, vomiting, diarrhea or abdominal pain.  GENITOURINARY: No dysuria, hematuria.  ENDOCRINE: No polyuria, nocturia,  HEMATOLOGY: No anemia, easy bruising or bleeding SKIN: No rash or lesion. MUSCULOSKELETAL: No joint pain or arthritis.   NEUROLOGIC: No tingling, numbness, weakness.  PSYCHIATRY: No anxiety or depression.   DRUG ALLERGIES:   Allergies  Allergen Reactions  . Codeine Itching and Rash    VITALS:  Blood pressure (!) 185/66, pulse 78, temperature 97.5 F (36.4 C), temperature source Oral, resp. rate 18, height '5\' 1"'$  (1.549 m), weight 79 kg (174 lb 3.2 oz), SpO2 94 %.  PHYSICAL EXAMINATION:  GENERAL:  83 y.o.-year-old patient lying in the bed with no acute distress.  EYES: Pupils equal, round, reactive to light and accommodation. No scleral icterus. Extraocular muscles intact.  HEENT: Head atraumatic, normocephalic. Oropharynx and nasopharynx clear.  NECK:  Supple, no jugular venous distention. No thyroid enlargement, no tenderness.  LUNGS: Moderate breath sounds bilaterally, no wheezing, rales,rhonchi or crepitation. No use of accessory muscles of respiration.  CARDIOVASCULAR: S1, S2 normal. No murmurs, rubs, or gallops.  ABDOMEN: Soft, nontender, nondistended. Bowel sounds present. No organomegaly or mass.   EXTREMITIES: No pedal edema, cyanosis, or clubbing.  NEUROLOGIC: Cranial nerves II through XII are intact. Muscle strength 5/5 in all extremities. Sensation intact. Gait not checked.  PSYCHIATRIC: The patient is alert and oriented x 3.  SKIN: No obvious rash, lesion, or ulcer.    LABORATORY PANEL:   CBC  Recent Labs Lab 10/06/16 0521  WBC 16.7*  HGB 11.6*  HCT 34.2*  PLT 176   ------------------------------------------------------------------------------------------------------------------  Chemistries   Recent Labs Lab 10/06/16 0521  NA 138  K 4.1  CL 103  CO2 31  GLUCOSE 167*  BUN 16  CREATININE 0.87  CALCIUM 8.3*   ------------------------------------------------------------------------------------------------------------------  Cardiac Enzymes  Recent Labs Lab 10/05/16 1911  TROPONINI <0.03   ------------------------------------------------------------------------------------------------------------------  RADIOLOGY:  Dg Chest 2 View  Result Date: 10/08/2016 CLINICAL DATA:  Shortness of breath. Pneumonia. History of COPD, myocardial infarction. EXAM: CHEST  2 VIEW COMPARISON:  10/05/2016 and 06/14/2016 radiographs.  CT 08/27/2015. FINDINGS: Stable cardiomegaly and aortic atherosclerosis post CABG. Diffusely accentuated interstitial markings are again noted throughout both lungs. Current appearance is most consistent with fibrosis. No definite superimposed edema, focal airspace disease or pleural effusion. The bones are demineralized. 3 level spinal augmentation has been performed. IMPRESSION: Diffusely accentuated interstitial markings, most consistent with fibrosis superimposed on emphysema. Stable cardiomegaly post CABG. Electronically Signed   By: Richardean Sale M.D.   On: 10/08/2016 15:13    EKG:   Orders placed or performed during the hospital encounter of 08/26/15  . EKG 12-Lead  . EKG 12-Lead  . ED EKG  . ED EKG  . EKG    ASSESSMENT AND PLAN:    # Sepsis ([place]) - Secondary to pneumonia Clinically  feeling better Continue IV antibiotic Rocephin and azithromycin Hemodynamically stable   #Acute COPD exacerbation with acute on chronic hypoxic respiratory failure, lives on 2 L of home oxygen Currently on 5 L of home oxygen and wean off as tolerated to 2 L Shortness of breath is better. Chest PT and incentive spirometry Tapering IV steroids, IV antibiotics and nebulizer treatments Out of bed and deconditioning   #Community acquired pneumonia. Continue IV Rocephin and azithromycin. Sputum cultures with gram-positive cocci inpatient chains , re-incubated MRSA PCR is negative from October 2017   #  Chronic systolic heart failure ([place]) -  Not fluid overloaded  continue home meds aspirin, Plavix, Lipitor       All the records are reviewed and case discussed with Care Management/Social Workerr. Management plans discussed with the patient, family and they are in agreement.  CODE STATUS: fc  TOTAL TIME TAKING CARE OF THIS PATIENT: 30 minutes.   POSSIBLE D/C IN  2 DAYS, DEPENDING ON CLINICAL CONDITION.  Note: This dictation was prepared with Dragon dictation along with smaller phrase technology. Any transcriptional errors that result from this process are unintentional.   Nicholes Mango M.D on 10/08/2016 at 5:07 PM  Between 7am to 6pm - Pager - [phone] After 6pm go to [email or web address] - password [email or web address] [facility]  Office  [phone]  CC: Primary care physician; Volanda Napoleon, MD

## 2016-10-08 NOTE — Plan of Care (Signed)
Problem: Pain Managment: Goal: General experience of comfort will improve Outcome: Completed/Met Date Met: 10/08/16 Pt with no complaints of pain this shift. Pt is still on 4L O2, pt does wear O2 at home but only 2L there. IV abx & IV steroids given. Pt slept well. Will continue to monitor.

## 2016-10-08 NOTE — Clinical Social Work Placement (Signed)
   CLINICAL SOCIAL WORK PLACEMENT  NOTE  Date:  10/08/2016  Patient Details  Name: Debra [name] MRN: [id number] Date of Birth: 1932-12-05  Clinical Social Work is seeking post-discharge placement for this patient at the [place] level of care (*CSW will initial, date and re-position this form in  chart as items are completed):  Yes   Patient/family provided with [place] Work Department's list of facilities offering this level of care within the geographic area requested by the patient (or if unable, by the patient's family).  Yes   Patient/family informed of their freedom to choose among providers that offer the needed level of care, that participate in Medicare, Medicaid or managed care program needed by the patient, have an available bed and are willing to accept the patient.  Yes   Patient/family informed of [facility]'s ownership interest in [facility] and [facility], as well as of the fact that they are under no obligation to receive care at these facilities.  PASRR submitted to EDS on       PASRR number received on       Existing PASRR number confirmed on 10/08/16     FL2 transmitted to all facilities in geographic area requested by pt/family on 10/08/16     FL2 transmitted to all facilities within larger geographic area on       Patient informed that his/her managed care company has contracts with or will negotiate with certain facilities, including the following:            Patient/family informed of bed offers received.  Patient chooses bed at       Physician recommends and patient chooses bed at      Patient to be transferred to   on  .  Patient to be transferred to facility by       Patient family notified on   of transfer.  Name of family member notified:        PHYSICIAN       Additional Comment:    _______________________________________________ Joshue Badal, Veronia Beets, LCSW 10/08/2016, 3:51 PM

## 2016-10-08 NOTE — Clinical Social Work Note (Signed)
Clinical Social Work Assessment  Patient Details  Name: Debra [name] MRN: [id number] Date of Birth: 05/22/1933  Date of referral:  10/08/16               Reason for consult:  Facility Placement                Permission sought to share information with:  Facility Contact Representative Permission granted to share information::  Yes, Verbal Permission Granted  Name::      Skilled Nursing Facility   Agency::   [place]   Relationship::     Contact Information:     Housing/Transportation Living arrangements for the past 2 months:  Single Family Home Source of Information:  Patient, Adult Children Patient Interpreter Needed:  None Criminal Activity/Legal Involvement Pertinent to Current Situation/Hospitalization:  No - Comment as needed Significant Relationships:  Adult Children, Other Family Members Lives with:  Other (Comment) (grand-daughter ) Do you feel safe going back to the place where you live?  Yes Need for family participation in patient care:  Yes (Comment)  Care giving concerns:  Patient lives in [place] with her granddaughter Debra [name].    Social Worker assessment / plan:  Clinical Social Worker (CSW) reviewed chart and noted that PT is recommending SNF. CSW met with patient alone at bedside to discuss D/C plan. CSW introduced self and explained role of CSW department. Patient reported that she lives in [place] with her granddaughter. CSW explained SNF process. Patient is agreeable to SNF search and prefers [facility]. FL2 complete and faxed out. CSW contacted patient's daughter Debra [name] and made her aware of above. Daughter is in agreement with SNF search in [place] and also prefers [facility]. CSW will continue to follow and assist as needed.   Employment status:  Retired Insurance information:  Managed Medicare PT Recommendations:  Skilled Nursing Facility Information / Referral to community resources:  Skilled Nursing  Facility  Patient/Family's Response to care:  Patient and daughter are agreeable to SNF search and prefer [facility].   Patient/Family's Understanding of and Emotional Response to Diagnosis, Current Treatment, and Prognosis:  Patient was pleasant and thanked CSW for assistance.   Emotional Assessment Appearance:  Appears stated age Attitude/Demeanor/Rapport:    Affect (typically observed):  Accepting, Adaptable, Pleasant Orientation:  Oriented to Self, Oriented to Place, Oriented to  Time, Oriented to Situation Alcohol / Substance use:  Not Applicable Psych involvement (Current and /or in the community):  No (Comment)  Discharge Needs  Concerns to be addressed:  Discharge Planning Concerns Readmission within the last 30 days:  No Current discharge risk:  Dependent with Mobility Barriers to Discharge:  Continued Medical Work up   Sample, Bailey M, LCSW 10/08/2016, 3:55 PM  

## 2016-10-08 NOTE — NC FL2 (Signed)
[place] LEVEL OF CARE SCREENING TOOL     IDENTIFICATION  Patient Name: JAQUASIA DOSCHER Birthdate: [birth year] Sex: female Admission Date (Current Location): 10/05/2016  [place] and [place] Number:  Engineering geologist and Address:  [facility], [address] [place], [place] [address]      Provider Number: [id number]  Attending Physician Name and Address:  Nicholes Mango, MD  Relative Name and Phone Number:       Current Level of Care: Hospital Recommended Level of Care: [place] Prior Approval Number:    Date Approved/Denied:   PASRR Number:  [id number] )  Discharge Plan: SNF    Current Diagnoses: Patient Active Problem List   Diagnosis Date Noted  . CAP (community acquired pneumonia) 10/05/2016  . Chronic systolic heart failure ([place]) 06/28/2016  . COPD (chronic obstructive pulmonary disease) with emphysema ([place]) 06/28/2016  . Tobacco use 06/28/2016  . Acidosis 09/14/2015  . Hypotension 09/14/2015  . Hypokalemia 09/14/2015  . COPD exacerbation ([place]) 09/01/2015  . Sepsis ([place]) 08/27/2015    Orientation RESPIRATION BLADDER Height & Weight     Self, Time, Situation, Place  O2 (4 Liters Oxygen ) Continent Weight: 174 lb 3.2 oz (79 kg) Height:  '5\' 1"'$  (154.9 cm)  BEHAVIORAL SYMPTOMS/MOOD NEUROLOGICAL BOWEL NUTRITION STATUS   (none)  (none) Continent Diet (Diet: Heart Healthy )  AMBULATORY STATUS COMMUNICATION OF NEEDS Skin   Extensive Assist Verbally Normal                       Personal Care Assistance Level of Assistance  Bathing, Feeding, Dressing Bathing Assistance: Limited assistance Feeding assistance: Independent Dressing Assistance: Limited assistance     Functional Limitations Info  Sight, Hearing, Speech Sight Info: Adequate Hearing Info: Impaired Speech Info: Adequate    SPECIAL CARE FACTORS FREQUENCY  PT (By licensed PT), OT (By licensed OT)     PT Frequency:  (5) OT Frequency:   (5)            Contractures      Additional Factors Info  Code Status, Allergies Code Status Info:  (Full Code. ) Allergies Info:  (Codeine)           Current Medications (10/08/2016):  This is the current hospital active medication list Current Facility-Administered Medications  Medication Dose Route Frequency Provider Last Rate Last Dose  . acetaminophen (TYLENOL) tablet 650 mg  650 mg Oral Q6H PRN Lance Coon, MD       Or  . acetaminophen (TYLENOL) suppository 650 mg  650 mg Rectal Q6H PRN Lance Coon, MD      . ALPRAZolam Duanne Moron) tablet 0.5 mg  0.5 mg Oral BID Lance Coon, MD   0.5 mg at 10/08/16 0835  . aspirin chewable tablet 81 mg  81 mg Oral Daily Lance Coon, MD   81 mg at 10/08/16 0835  . atorvastatin (LIPITOR) tablet 10 mg  10 mg Oral QPM Lance Coon, MD   10 mg at 10/07/16 1820  . azithromycin (ZITHROMAX) 500 mg in dextrose 5 % 250 mL IVPB  500 mg Intravenous Q24H Lance Coon, MD   500 mg at 10/07/16 1820  . benzonatate (TESSALON) capsule 200 mg  200 mg Oral TID PRN Lance Coon, MD   200 mg at 10/08/16 0842  . cefTRIAXone (ROCEPHIN) IVPB 1 g  1 g Intravenous Q24H Lance Coon, MD   1 g at 10/08/16 0030  . clopidogrel (PLAVIX) tablet 75  mg  75 mg Oral Brigid Re, MD   75 mg at 10/08/16 2248  . enoxaparin (LOVENOX) injection 40 mg  40 mg Subcutaneous Q24H Lance Coon, MD   40 mg at 10/07/16 2124  . FLUoxetine (PROZAC) capsule 20 mg  20 mg Oral Daily Lance Coon, MD   20 mg at 10/08/16 0834  . guaiFENesin-dextromethorphan (ROBITUSSIN DM) 100-10 MG/5ML syrup 5 mL  5 mL Oral Q4H PRN Lance Coon, MD      . HYDROcodone-acetaminophen (NORCO/VICODIN) 5-325 MG per tablet 1 tablet  1 tablet Oral Q6H PRN Lance Coon, MD   1 tablet at 10/07/16 1827  . ipratropium-albuterol (DUONEB) 0.5-2.5 (3) MG/3ML nebulizer solution 3 mL  3 mL Nebulization Q4H PRN Lance Coon, MD      . latanoprost (XALATAN) 0.005 % ophthalmic solution 1 drop  1 drop Both Eyes QHS Lance Coon, MD   1 drop at 10/07/16 2129  . MEDLINE mouth rinse  15 mL Mouth Rinse BID Nicholes Mango, MD   15 mL at 10/08/16 0844  . methylPREDNISolone sodium succinate (SOLU-MEDROL) 40 mg/mL injection 40 mg  40 mg Intravenous Q12H Nicholes Mango, MD   40 mg at 10/08/16 0834  . mometasone-formoterol (DULERA) 100-5 MCG/ACT inhaler 2 puff  2 puff Inhalation BID Lance Coon, MD   2 puff at 10/08/16 [phone]  . ondansetron (ZOFRAN) tablet 4 mg  4 mg Oral Q6H PRN Lance Coon, MD       Or  . ondansetron [facility]) injection 4 mg  4 mg Intravenous Q6H PRN Lance Coon, MD      . pantoprazole (PROTONIX) EC tablet 40 mg  40 mg Oral Brigid Re, MD   40 mg at 10/08/16 3704  . tiotropium (SPIRIVA) inhalation capsule 18 mcg  18 mcg Inhalation q morning - 10a Lance Coon, MD   18 mcg at 10/08/16 [phone]  . tiZANidine (ZANAFLEX) tablet 2 mg  2 mg Oral TID PRN Lance Coon, MD      . traZODone (DESYREL) tablet 50 mg  50 mg Oral QHS Lance Coon, MD   50 mg at 10/07/16 2124     Discharge Medications: Please see discharge summary for a list of discharge medications.  Relevant Imaging Results:  Relevant Lab Results:   Additional Information  (SSN: [id number])  Sample, Veronia Beets, LCSW

## 2016-10-08 NOTE — Progress Notes (Signed)
Pharmacy Antibiotic Note  Debra [name] is a 83 y.o. female admitted on 10/05/2016 with pneumonia.  Pharmacy has been consulted for ceftriaxone dosing.  Plan: Ceftriaxone 1 gram q 24 hours ordered.  Height: '5\' 1"'$  (154.9 cm) Weight: 174 lb 3.2 oz (79 kg) IBW/kg (Calculated) : 47.8  Temp (24hrs), Avg:97.9 F (36.6 C), Min:97.8 F (36.6 C), Max:98 F (36.7 C)   Recent Labs Lab 10/05/16 1801 10/05/16 1911 10/05/16 1943 10/05/16 2247 10/06/16 0521  WBC 20.4*  --   --   --  16.7*  CREATININE  --  0.92  --   --  0.87  LATICACIDVEN  --   --  0.9 1.1  --     Estimated Creatinine Clearance: 48.3 mL/min (by C-G formula based on SCr of 0.87 mg/dL).    Allergies  Allergen Reactions  . Codeine Itching and Rash    Antimicrobials this admission: ceftriaxone 1/27 >>  azithromycin 1/27 >>   Dose adjustments this admission:   Microbiology results: 1/26 Sputum: pending  06/10/16 MRSA PCR: (-)    Thank you for allowing pharmacy to be a part of this patient's care.  Rocky Morel 10/08/2016 10:43 AM

## 2016-10-08 NOTE — Care Management Obs Status (Deleted)
[employer] NOTIFICATION   Patient Details  Name: Debra [name] MRN: [id number] Date of Birth: 05-23-1933   Medicare Observation Status Notification Given:  Yes    Shelbie Ammons, RN 10/08/2016, 10:13 AM

## 2016-10-08 NOTE — Evaluation (Signed)
Physical Therapy Evaluation Patient Details Name: Debra [name] MRN: [id number] DOB: Nov 26, 1932 Today's Date: 10/08/2016   History of Present Illness  Pt is an 83 y.o. female presenting with SOB.  Pt admitted to hospital with COPD exacerbation with acute on chronic hypoxic respiratory failure and community acquired PNA.  PMH includes 2 L O2 baseline, asthma, COPD, MI, chronic systolic heart failure, triple bypass graft, MI.  Clinical Impression  Prior to hospital admission, pt was ambulating household distances modified independently with rollator.  Pt lives with her granddaughter in 1 level home with ramp to enter.  Currently pt is SBA with bed mobility and min assist with transfers using RW.  Per discussion with nursing, trialed pt on 3 L O2 with functional mobility.  O2 sats 94% on 3 L O2 via nasal cannula resting in bed.  Upon ambulating to bathroom (min assist with RW) pt's O2 sats decreased to 84% so therapist increased O2 to 4 L and pt's O2 returned to 95%.  Pt with significant increased SOB walking toilet back to bed (using RW and on 4 L O2) requiring mod assist of therapist for safety (see below for details).  O2 sats 81% upon return to bed (on 4 L) and required a few minutes rest break in bed to return to 92% (on 4 L).  D/t pt's significant SOB and respiratory concerns noted with ambulation to/from bathroom, recommend pt use bedside commode (nursing and NA notified).  Pt would benefit from skilled PT to address noted impairments and functional limitations.  Recommend pt discharge to STR when medically appropriate.    Follow Up Recommendations SNF    Equipment Recommendations  Rolling walker with 5" wheels    Recommendations for Other Services       Precautions / Restrictions Precautions Precautions: Fall Restrictions Weight Bearing Restrictions: No      Mobility  Bed Mobility Overal bed mobility: Needs Assistance Bed Mobility: Supine to Sit;Sit to Supine     Supine to sit:  Supervision;HOB elevated Sit to supine: Supervision;HOB elevated   General bed mobility comments: increased effort to perform; 2 assist to boost pt up in bed end of session d/t pt SOB and pt reporting unable to perform any further mobility  Transfers Overall transfer level: Needs assistance Equipment used: Rolling walker (2 wheeled) Transfers: Sit to/from Stand Sit to Stand: Min assist         General transfer comment: from bed and toilet; increased effort to stand  Ambulation/Gait Ambulation/Gait assistance: Min assist;Mod assist Ambulation Distance (Feet):  (10 feet x2) Assistive device: Rolling walker (2 wheeled)   Gait velocity: decreased   General Gait Details: pt initially requiring vc's to stay closer to walker walking bed to bathroom and min assist to steady (pt noted to be fairly SOB upon sitting on toilet); pt with increasing forward flexed posture, pushing RW too far away, stopping to catch breath, and significant SOB walking toilet back to bed requiring max vc's for safety and mod assist from therapist  Stairs            Wheelchair Mobility    Modified Rankin (Stroke Patients Only)       Balance Overall balance assessment: Needs assistance Sitting-balance support: Bilateral upper extremity supported;Feet supported Sitting balance-Leahy Scale: Fair     Standing balance support: During functional activity;No upper extremity supported Standing balance-Leahy Scale: Fair Standing balance comment: doffing/donning underwear  Pertinent Vitals/Pain Pain Assessment: No/denies pain    Home Living Family/patient expects to be discharged to:: Private residence Living Arrangements: Other relatives (granddaughter) Available Help at Discharge: Family;Available 24 hours/day Type of Home: Mobile home Home Access: Ramped entrance     Home Layout: One level Home Equipment: [place] - 4 wheels;Bedside commode;Grab bars -  toilet      Prior Function Level of Independence: Needs assistance   Gait / Transfers Assistance Needed: Pt modified independent with household distances.     Comments: Pt denies any falls in past 6 months.     Hand Dominance        Extremity/Trunk Assessment   Upper Extremity Assessment Upper Extremity Assessment: Generalized weakness    Lower Extremity Assessment Lower Extremity Assessment: Generalized weakness       Communication   Communication: No difficulties  Cognition Arousal/Alertness: Awake/alert Behavior During Therapy: WFL for tasks assessed/performed Overall Cognitive Status: Within Functional Limits for tasks assessed                      General Comments General comments (skin integrity, edema, etc.): Pt reporting needing to toilet upon PT entering room.  Nursing cleared pt for participation in physical therapy.  Pt agreeable to PT session.    Exercises     Assessment/Plan    PT Assessment Patient needs continued PT services  PT Problem List Decreased strength;Decreased activity tolerance;Decreased balance;Decreased mobility;Cardiopulmonary status limiting activity          PT Treatment Interventions DME instruction;Gait training;Functional mobility training;Therapeutic activities;Therapeutic exercise;Balance training;Patient/family education    PT Goals (Current goals can be found in the Care Plan section)  Acute Rehab PT Goals Patient Stated Goal: to go home PT Goal Formulation: With patient Time For Goal Achievement: 10/22/16 Potential to Achieve Goals: Fair    Frequency Min 2X/week   Barriers to discharge Decreased caregiver support      Co-evaluation               End of Session Equipment Utilized During Treatment: Gait belt;Oxygen Activity Tolerance: Patient limited by fatigue;Other (comment) (Limited d/t SOB) Patient left: in bed;with call bell/phone within reach;with bed alarm set Nurse Communication: Mobility  status;Precautions (O2 desaturation with activity; recommendation for use of BSC (not walking to toilet))         Time: 1100-1131 PT Time Calculation (min) (ACUTE ONLY): 31 min   Charges:   PT Evaluation $PT Eval Low Complexity: 1 Procedure PT Treatments $Therapeutic Activity: 8-22 mins   PT G CodesLeitha Bleak 10-23-2016, 12:28 PM Leitha Bleak, [place]

## 2016-10-09 ENCOUNTER — Inpatient Hospital Stay: Payer: Medicare Other

## 2016-10-09 LAB — MRSA PCR SCREENING: MRSA by PCR: NEGATIVE

## 2016-10-09 LAB — CREATININE, SERUM: Creatinine, Ser: 0.85 mg/dL (ref 0.44–1.00)

## 2016-10-09 MED ORDER — POLYETHYLENE GLYCOL 3350 17 G PO PACK
17.0000 g | PACK | Freq: Every day | ORAL | Status: DC
  Administered 2016-10-09 – 2016-10-10 (×2): 17 g via ORAL
  Filled 2016-10-09 (×2): qty 1

## 2016-10-09 MED ORDER — PIPERACILLIN-TAZOBACTAM 3.375 G IVPB
3.3750 g | Freq: Three times a day (TID) | INTRAVENOUS | Status: DC
  Administered 2016-10-09 – 2016-10-10 (×3): 3.375 g via INTRAVENOUS
  Filled 2016-10-09 (×6): qty 50

## 2016-10-09 MED ORDER — METOPROLOL TARTRATE 50 MG PO TABS
50.0000 mg | ORAL_TABLET | Freq: Two times a day (BID) | ORAL | Status: DC
  Administered 2016-10-09 – 2016-10-10 (×2): 50 mg via ORAL
  Filled 2016-10-09 (×3): qty 1

## 2016-10-09 MED ORDER — HYDRALAZINE HCL 20 MG/ML IJ SOLN
10.0000 mg | Freq: Four times a day (QID) | INTRAMUSCULAR | Status: DC | PRN
  Filled 2016-10-09: qty 1

## 2016-10-09 MED ORDER — HYDRALAZINE HCL 50 MG PO TABS
25.0000 mg | ORAL_TABLET | Freq: Three times a day (TID) | ORAL | Status: DC
  Administered 2016-10-09 – 2016-10-10 (×4): 25 mg via ORAL
  Filled 2016-10-09 (×3): qty 1
  Filled 2016-10-09: qty 2

## 2016-10-09 MED ORDER — VANCOMYCIN HCL IN DEXTROSE 1-5 GM/200ML-% IV SOLN
1000.0000 mg | INTRAVENOUS | Status: DC
  Administered 2016-10-10: 05:00:00 1000 mg via INTRAVENOUS
  Filled 2016-10-09 (×2): qty 200

## 2016-10-09 MED ORDER — PIPERACILLIN-TAZOBACTAM 3.375 G IVPB 30 MIN: 3.3750 g | Freq: Three times a day (TID) | INTRAVENOUS | Status: DC

## 2016-10-09 MED ORDER — LOSARTAN POTASSIUM 50 MG PO TABS
25.0000 mg | ORAL_TABLET | Freq: Every day | ORAL | Status: DC
  Administered 2016-10-09 – 2016-10-10 (×2): 25 mg via ORAL
  Filled 2016-10-09: qty 1
  Filled 2016-10-09: qty 2

## 2016-10-09 MED ORDER — VANCOMYCIN HCL IN DEXTROSE 1-5 GM/200ML-% IV SOLN
1000.0000 mg | Freq: Once | INTRAVENOUS | Status: AC
  Administered 2016-10-09: 1000 mg via INTRAVENOUS
  Filled 2016-10-09: qty 200

## 2016-10-09 MED ORDER — BISACODYL 10 MG RE SUPP
5.0000 mg | Freq: Every day | RECTAL | Status: DC | PRN
  Filled 2016-10-09: qty 1

## 2016-10-09 MED ORDER — METOPROLOL TARTRATE 5 MG/5ML IV SOLN: 10.0000 mg | Freq: Four times a day (QID) | INTRAVENOUS | Status: DC | PRN

## 2016-10-09 NOTE — Care Management Important Message (Signed)
Important Message  Patient Details  Name: Debra [name] MRN: [id number] Date of Birth: March 12, 1933   Medicare Important Message Given:  Yes    Shelbie Ammons, RN 10/09/2016, 12:00 PM

## 2016-10-09 NOTE — Progress Notes (Addendum)
Social work Theatre manager met with patient at bedside. Social work Theatre manager explained to patient that [facility] accepted her offer. Patient was pleased to be going to [facility]. Social work Theatre manager left message with patient's daughter making her aware of accepted bed offer at [facility]. Social work Theatre manager will continue to assist and follow as needed.   Danie Chandler, Social Work Intern  [phone]

## 2016-10-09 NOTE — Progress Notes (Signed)
Pharmacy Antibiotic Note  Debra [name] is a 83 y.o. female admitted on 10/05/2016 with pneumonia.  Pharmacy has been consulted for vancomycin dosing.  This is day #5 of antibiotics. Per MD notes, patient is not improving on ceftriaxone and azithromycin so antibiotics are being changed to vancomycin and piperacillin/tazobactam.  Plan: Discontinued ceftriaxone and azithromycin  Ordered vancomycin 1000 mg once followed by vancomycin 1000 mg IV q24h (10 hour stacked dose) Goal vancomycin trough 15-20 mcg/mL Vancomycin trough ordered for 2/2 at 0530  Kinetics: Using adjusted body weight = 59 kg Ke: 0.044 Half-life: 16 hours Vd: 41 L Cmin ~17 mcg/mL calculated Patient is at risk of accumulation due to obesity  MRSA PCR ordered per protocol.  Height: '5\' 1"'$  (154.9 cm) Weight: 170 lb 9.6 oz (77.4 kg) IBW/kg (Calculated) : 47.8  Temp (24hrs), Avg:97.9 F (36.6 C), Min:97.5 F (36.4 C), Max:98.2 F (36.8 C)   Recent Labs Lab 10/05/16 1801 10/05/16 1911 10/05/16 1943 10/05/16 2247 10/06/16 0521 10/09/16 0450  WBC 20.4*  --   --   --  16.7*  --   CREATININE  --  0.92  --   --  0.87 0.85  LATICACIDVEN  --   --  0.9 1.1  --   --     Estimated Creatinine Clearance: 48.8 mL/min (by C-G formula based on SCr of 0.85 mg/dL).    Allergies  Allergen Reactions  . Codeine Itching and Rash   Antimicrobials this admission: vancomycin 1/30 >>  Piperacillin/tazobactam 1/30 >>  CTX 1/27 >> 1/30 Azithromycin 1/27 >> 1/30 Levofloxacin in ED on 1/26  Dose adjustments this admission:  Microbiology results:  Thank you for allowing pharmacy to be a part of this patient's care.  Lenis Noon, PharmD Clinical Pharmacist 10/09/2016 6:52 PM

## 2016-10-09 NOTE — Progress Notes (Signed)
[facility] [place] NAME: Debra [name]    MR#:  [id number]  DATE OF BIRTH:  12/18/1932  SUBJECTIVE:  CHIEF COMPLAINT:  Patient's shortness of breath And cough are still present but better. Feeling weak and tired chronically lives on 2 L of oxygen at [facility] requiring 5-6 L with ambulation, Sedated with no bowel movement for 5 days.Marland Kitchen  REVIEW OF SYSTEMS:  CONSTITUTIONAL: No fever, fatigue or weakness.  EYES: No blurred or double vision.  EARS, NOSE, AND THROAT: No tinnitus or ear pain.  RESPIRATORY: Reports cough, improving shortness of breath, denies wheezing or hemoptysis.  CARDIOVASCULAR: No chest pain, orthopnea, edema.  GASTROINTESTINAL: No nausea, vomiting, diarrhea or abdominal pain. Reporting constipation  GENITOURINARY: No dysuria, hematuria.  ENDOCRINE: No polyuria, nocturia,  HEMATOLOGY: No anemia, easy bruising or bleeding SKIN: No rash or lesion. MUSCULOSKELETAL: No joint pain or arthritis.   NEUROLOGIC: No tingling, numbness, weakness.  PSYCHIATRY: No anxiety or depression.   DRUG ALLERGIES:   Allergies  Allergen Reactions  . Codeine Itching and Rash    VITALS:  Blood pressure (!) 193/84, pulse 73, temperature 97.5 F (36.4 C), temperature source Oral, resp. rate 20, height '5\' 1"'$  (1.549 m), weight 77.4 kg (170 lb 9.6 oz), SpO2 99 %.  PHYSICAL EXAMINATION:  GENERAL:  83 y.o.-year-old patient lying in the bed with no acute distress.  EYES: Pupils equal, round, reactive to light and accommodation. No scleral icterus. Extraocular muscles intact.  HEENT: Head atraumatic, normocephalic. Oropharynx and nasopharynx clear.  NECK:  Supple, no jugular venous distention. No thyroid enlargement, no tenderness.  LUNGS: Moderate breath sounds bilaterally, no wheezing, rales,rhonchi or crepitation. No use of accessory muscles of respiration.  CARDIOVASCULAR: S1, S2 normal. No murmurs, rubs, or gallops.  ABDOMEN: Soft,  nontender, nondistended. Bowel sounds present. No organomegaly or mass.  EXTREMITIES: No pedal edema, cyanosis, or clubbing.  NEUROLOGIC: Cranial nerves II through XII are intact. Muscle strength 5/5 in all extremities. Sensation intact. Gait not checked.  PSYCHIATRIC: The patient is alert and oriented x 3.  SKIN: No obvious rash, lesion, or ulcer.    LABORATORY PANEL:   CBC  Recent Labs Lab 10/06/16 0521  WBC 16.7*  HGB 11.6*  HCT 34.2*  PLT 176   ------------------------------------------------------------------------------------------------------------------  Chemistries   Recent Labs Lab 10/06/16 0521 10/09/16 0450  NA 138  --   K 4.1  --   CL 103  --   CO2 31  --   GLUCOSE 167*  --   BUN 16  --   CREATININE 0.87 0.85  CALCIUM 8.3*  --    ------------------------------------------------------------------------------------------------------------------  Cardiac Enzymes  Recent Labs Lab 10/05/16 1911  TROPONINI <0.03   ------------------------------------------------------------------------------------------------------------------  RADIOLOGY:  Dg Chest 2 View  Result Date: 10/08/2016 CLINICAL DATA:  Shortness of breath. Pneumonia. History of COPD, myocardial infarction. EXAM: CHEST  2 VIEW COMPARISON:  10/05/2016 and 06/14/2016 radiographs.  CT 08/27/2015. FINDINGS: Stable cardiomegaly and aortic atherosclerosis post CABG. Diffusely accentuated interstitial markings are again noted throughout both lungs. Current appearance is most consistent with fibrosis. No definite superimposed edema, focal airspace disease or pleural effusion. The bones are demineralized. 3 level spinal augmentation has been performed. IMPRESSION: Diffusely accentuated interstitial markings, most consistent with fibrosis superimposed on emphysema. Stable cardiomegaly post CABG. Electronically Signed   By: Richardean Sale M.D.   On: 10/08/2016 15:13    EKG:   Orders placed or performed  during the hospital encounter of 08/26/15  .  EKG 12-Lead  . EKG 12-Lead  . ED EKG  . ED EKG  . EKG    ASSESSMENT AND PLAN:   # Sepsis ([place]) - Secondary to pneumonia Clinically feeling better Change IV antibiotics to Zosyn and vancomycin is clinically not improving discontinued Rocephin   #Hypertensive urgency Continue metoprolol Resume home medication Cozaar Hydralazine is added to the regimen titrate as needed  #Acute COPD exacerbation with acute on chronic hypoxic respiratory failure, lives on 2 L of home oxygen Currently on 5 L of home oxygen and wean off as tolerated to 2 L Shortness of breath is better. Chest PT and incentive spirometry Tapering IV steroids, IV antibiotics and nebulizer treatments Out of bed and deconditioning Pulmonology consult Repeat chest x-ray   #Community acquired pneumonia.   antibiotics changed to Zosyn and vancomycin and discontinued Rocephin and azithromycin  Sputum cultures with gram-positive cocci inpatient chains , re-incubated MRSA PCR is negative from October 2017   #  Chronic systolic heart failure ([place]) -  Not fluid overloaded  continue home meds aspirin, Plavix, Lipitor   #Constipation and laxatives. Abdominal x-ray    All the records are reviewed and case discussed with Care Management/Social Workerr. Management plans discussed with the patient, family and they are in agreement.  CODE STATUS: fc  TOTAL TIME TAKING CARE OF THIS PATIENT: 35 minutes.   POSSIBLE D/C IN  2 DAYS, DEPENDING ON CLINICAL CONDITION.  Note: This dictation was prepared with Dragon dictation along with smaller phrase technology. Any transcriptional errors that result from this process are unintentional.   Nicholes Mango M.D on 10/09/2016 at 4:18 PM  Between 7am to 6pm - Pager - [phone] After 6pm go to [email or web address] - password [email or web address] [facility]  Office  [phone]  CC: Primary care physician; Volanda Napoleon,  MD

## 2016-10-10 MED ORDER — METOPROLOL TARTRATE 25 MG PO TABS: 25.0000 mg | ORAL_TABLET | Freq: Two times a day (BID) | ORAL | 0 refills | Status: DC

## 2016-10-10 MED ORDER — SACCHAROMYCES BOULARDII 250 MG PO CAPS: 250.0000 mg | ORAL_CAPSULE | Freq: Two times a day (BID) | ORAL | 0 refills | Status: DC

## 2016-10-10 MED ORDER — BISACODYL 10 MG RE SUPP
10.0000 mg | RECTAL | Status: AC
Start: 2016-10-10 — End: 2016-10-10
  Administered 2016-10-10: 16:00:00 10 mg via RECTAL

## 2016-10-10 MED ORDER — ACETAMINOPHEN 325 MG PO TABS: 650.0000 mg | ORAL_TABLET | Freq: Four times a day (QID) | ORAL | Status: DC | PRN

## 2016-10-10 MED ORDER — AMOXICILLIN-POT CLAVULANATE 875-125 MG PO TABS: 1.0000 | ORAL_TABLET | Freq: Two times a day (BID) | ORAL | 0 refills | Status: DC

## 2016-10-10 MED ORDER — BISACODYL 10 MG RE SUPP: 5.0000 mg | Freq: Every day | RECTAL | 0 refills | Status: DC | PRN

## 2016-10-10 MED ORDER — HYDROCODONE-ACETAMINOPHEN 5-325 MG PO TABS: 1.0000 | ORAL_TABLET | Freq: Four times a day (QID) | ORAL | 0 refills | Status: DC | PRN

## 2016-10-10 MED ORDER — ALPRAZOLAM 0.5 MG PO TABS: 0.5000 mg | ORAL_TABLET | Freq: Two times a day (BID) | ORAL | 0 refills | Status: DC

## 2016-10-10 MED ORDER — GUAIFENESIN-DM 100-10 MG/5ML PO SYRP: 10.0000 mL | ORAL_SOLUTION | Freq: Four times a day (QID) | ORAL | 0 refills | Status: DC | PRN

## 2016-10-10 MED ORDER — IPRATROPIUM-ALBUTEROL 0.5-2.5 (3) MG/3ML IN SOLN: 3.0000 mL | RESPIRATORY_TRACT | 0 refills | Status: DC | PRN

## 2016-10-10 MED ORDER — PREDNISONE 10 MG (21) PO TBPK: 10.0000 mg | ORAL_TABLET | Freq: Every day | ORAL | 0 refills | Status: DC

## 2016-10-10 MED ORDER — HYDRALAZINE HCL 25 MG PO TABS: 25.0000 mg | ORAL_TABLET | Freq: Three times a day (TID) | ORAL | 0 refills | Status: DC

## 2016-10-10 NOTE — Discharge Summary (Signed)
[facility] [place] NAME: Debra [name]    MR#:  [id number]  DATE OF BIRTH:  13-Mar-1933  DATE OF ADMISSION:  10/05/2016 ADMITTING PHYSICIAN: Lance Coon, MD  DATE OF DISCHARGE: 10/10/16 PRIMARY CARE PHYSICIAN: Volanda Napoleon, MD    ADMISSION DIAGNOSIS:  COPD exacerbation ([place]) [J44.1] Acute respiratory failure with hypoxia ([place]) [J96.01] Community acquired pneumonia, unspecified laterality [J18.9]  DISCHARGE DIAGNOSIS:  Principal Problem:   Sepsis ([place]) Active Problems:   COPD exacerbation ([place])   Chronic systolic heart failure ([place])   CAP (community acquired pneumonia)   SECONDARY DIAGNOSIS:   Past Medical History:  Diagnosis Date  . Asthma   . COPD (chronic obstructive pulmonary disease) ([place])   . Myocardial infarction     HOSPITAL COURSE:  Brief history and physical Debra [name]  is a 83 y.o. female who presents with SOB for the past several days.  Today she developed right sided pleurodynia.  Here in the ED she was found to have Pneumonia. Hospitalists were called for admission. Review history and physical for complete details  Hospital course  #Sepsis [facility]) - Secondary to pneumonia Changed IV antibiotics to Zosyn and vancomycin as clinically not improving discontinued Rocephin during the hospital course. Patient is doing much better. Discharge to skilled nursing facility with by mouth Augmentin  #Hypertensive urgency Continue metoprolol Resume home medication Cozaar Hydralazine is added to the regimen titrate as needed  #Acute COPD exacerbation with acute on chronic hypoxic respiratory failure, lives on 2 L of home oxygen Currently on 4 L of home oxygen and wean off as tolerated to 2 L of oxygen Shortness of breath is better. Chest PT and incentive spirometry Tapering IV steroids, IV antibiotics and nebulizer treatments Out of bed and deconditioning Pulmonology  outpatient follow-up Repeat  chest x-ray coarse lung markings no acute process   #Community acquired pneumonia.   antibiotics changed to Zosyn and vancomycin and discontinued Rocephin and azithromycin and patient initially improved with Zosyn and vancomycin and discharge with by mouth Augmentin Sputum cultures with gram-positive cocci inpatient chains , re-incubated, multiple organisms are present none predominant MRSA PCR is negative from October 2017   #Chronic systolic heart failure ([place]) -  Not fluid overloaded  continue home meds aspirin, Plavix, Lipitor , lasix  #Constipation and laxatives. Patient had a small bowel movement today. Abdominal x-rays negative  Discharge patient to skilled nursing facility  DISCHARGE CONDITIONS:   fair  CONSULTS OBTAINED:  Treatment Team:  Allyne Gee, MD   PROCEDURES  None   DRUG ALLERGIES:   Allergies  Allergen Reactions  . Codeine Itching and Rash    DISCHARGE MEDICATIONS:   Current Discharge Medication List    START taking these medications   Details  acetaminophen (TYLENOL) 325 MG tablet Take 2 tablets (650 mg total) by mouth every 6 (six) hours as needed for mild pain (or Fever >/= 101).    amoxicillin-clavulanate (AUGMENTIN) 875-125 MG tablet Take 1 tablet by mouth 2 (two) times daily. Qty: 10 tablet, Refills: 0    bisacodyl (DULCOLAX) 10 MG suppository Place 0.5 suppositories (5 mg total) rectally daily as needed for moderate constipation. Qty: 12 suppository, Refills: 0    guaiFENesin-dextromethorphan (ROBITUSSIN DM) 100-10 MG/5ML syrup Take 10 mLs by mouth every 6 (six) hours as needed for cough. Qty: 118 mL, Refills: 0    hydrALAZINE (APRESOLINE) 25 MG tablet Take 1 tablet (25 mg total) by mouth every 8 (eight) hours. Qty: 90  tablet, Refills: 0    ipratropium-albuterol (DUONEB) 0.5-2.5 (3) MG/3ML SOLN Take 3 mLs by nebulization every 4 (four) hours as needed. Qty: 360 mL, Refills: 0    metoprolol (LOPRESSOR) 25 MG tablet Take 1  tablet (25 mg total) by mouth 2 (two) times daily. Qty: 60 tablet, Refills: 0    predniSONE (STERAPRED UNI-PAK 21 TAB) 10 MG (21) TBPK tablet Take 1 tablet (10 mg total) by mouth daily. Take 6 tablets by mouth for 1 day followed by  5 tablets by mouth for 1 day followed by  4 tablets by mouth for 1 day followed by  3 tablets by mouth for 1 day followed by  2 tablets by mouth for 1 day followed by  1 tablet by mouth for a day and stop Qty: 21 tablet, Refills: 0    saccharomyces boulardii (FLORASTOR) 250 MG capsule Take 1 capsule (250 mg total) by mouth 2 (two) times daily. Qty: 30 capsule, Refills: 0      CONTINUE these medications which have CHANGED   Details  ALPRAZolam (XANAX) 0.5 MG tablet Take 1 tablet (0.5 mg total) by mouth 2 (two) times daily. Qty: 15 tablet, Refills: 0    HYDROcodone-acetaminophen (NORCO/VICODIN) 5-325 MG tablet Take 1 tablet by mouth every 6 (six) hours as needed for moderate pain. Qty: 15 tablet, Refills: 0      CONTINUE these medications which have NOT CHANGED   Details  albuterol (PROVENTIL HFA;VENTOLIN HFA) 108 (90 Base) MCG/ACT inhaler Inhale 2 puffs into the lungs every 6 (six) hours as needed for wheezing or shortness of breath.    albuterol (PROVENTIL) (2.5 MG/3ML) 0.083% nebulizer solution Take 2.5 mg by nebulization 4 (four) times daily as needed for wheezing or shortness of breath.    aspirin 81 MG chewable tablet Chew by mouth every morning.    atorvastatin (LIPITOR) 10 MG tablet Take 10 mg by mouth every evening.    Calcium Carbonate-Vitamin D (OYSTERCAL 500 + D PO) Take 1 tablet by mouth 2 (two) times daily.    clopidogrel (PLAVIX) 75 MG tablet Take 75 mg by mouth every morning.     diclofenac sodium (VOLTAREN) 1 % GEL Apply 2 g topically 2 (two) times daily as needed (for pain).    docusate sodium (COLACE) 50 MG capsule Take 50 mg by mouth 2 (two) times daily.    fexofenadine (ALLEGRA) 180 MG tablet Take 180 mg by mouth daily.     FLUoxetine (PROZAC) 20 MG capsule Take 1 capsule (20 mg total) by mouth daily. Qty: 30 capsule, Refills: 3    latanoprost (XALATAN) 0.005 % ophthalmic solution Place 1 drop into both eyes at bedtime.    losartan (COZAAR) 25 MG tablet Take 25 mg by mouth daily.    pantoprazole (PROTONIX) 40 MG tablet Take 40 mg by mouth every morning.     tiotropium (SPIRIVA HANDIHALER) 18 MCG inhalation capsule Place 1 capsule (18 mcg total) into inhaler and inhale every morning. Qty: 30 capsule, Refills: 12    tiZANidine (ZANAFLEX) 2 MG tablet Take 2 mg by mouth 3 (three) times daily as needed for muscle spasms.     traZODone (DESYREL) 50 MG tablet Take 50 mg by mouth at bedtime.     furosemide (LASIX) 40 MG tablet Take 1 tablet (40 mg total) by mouth daily. Qty: 30 tablet, Refills: 0    mometasone-formoterol (DULERA) 100-5 MCG/ACT AERO Inhale 2 puffs into the lungs 2 (two) times daily. Qty: 1 Inhaler, Refills: 1  DISCHARGE INSTRUCTIONS:  Follow-up with primary care physician in a week Follow-up with pulmonology in 2 weeks   DIET:  Cardiac diet  DISCHARGE CONDITION:  Fair  ACTIVITY:  Activity as tolerated  OXYGEN:  Home Oxygen: Yes.     Oxygen Delivery: 4 liters/min via Patient connected to nasal cannula oxygen  DISCHARGE LOCATION:  nursing home   If you experience worsening of your admission symptoms, develop shortness of breath, life threatening emergency, suicidal or homicidal thoughts you must seek medical attention immediately by calling 911 or calling your MD immediately  if symptoms less severe.  You Must read complete instructions/literature along with all the possible adverse reactions/side effects for all the Medicines you take and that have been prescribed to you. Take any new Medicines after you have completely understood and accpet all the possible adverse reactions/side effects.   Please note  You were cared for by a hospitalist during your hospital stay.  If you have any questions about your discharge medications or the care you received while you were in the hospital after you are discharged, you can call the unit and asked to speak with the hospitalist on call if the hospitalist that took care of you is not available. Once you are discharged, your primary care physician will handle any further medical issues. Please note that NO REFILLS for any discharge medications will be authorized once you are discharged, as it is imperative that you return to your primary care physician (or establish a relationship with a primary care physician if you do not have one) for your aftercare needs so that they can reassess your need for medications and monitor your lab values.     Today  Chief Complaint  Patient presents with  . Respiratory Distress  . Shortness of Breath   Patient is feeling much better. She had small bowel movement with laxatives discussed with patient's daughter agreeable to skilled nursing facility placement  ROS:  CONSTITUTIONAL: Denies fevers, chills. Denies any fatigue, weakness.  EYES: Denies blurry vision, double vision, eye pain. EARS, NOSE, THROAT: Denies tinnitus, ear pain, hearing loss. RESPIRATORY: Denies cough, wheeze, shortness of breath.  CARDIOVASCULAR: Denies chest pain, palpitations, edema.  GASTROINTESTINAL: Denies nausea, vomiting, diarrhea, abdominal pain. Denies bright red blood per rectum. GENITOURINARY: Denies dysuria, hematuria. ENDOCRINE: Denies nocturia or thyroid problems. HEMATOLOGIC AND LYMPHATIC: Denies easy bruising or bleeding. SKIN: Denies rash or lesion. MUSCULOSKELETAL: Denies pain in neck, back, shoulder, knees, hips or arthritic symptoms.  NEUROLOGIC: Denies paralysis, paresthesias.  PSYCHIATRIC: Denies anxiety or depressive symptoms.   VITAL SIGNS:  Blood pressure 136/74, pulse 64, temperature 97.6 F (36.4 C), temperature source Oral, resp. rate 18, height '5\' 1"'$  (1.549 m), weight 77.6 kg (171  lb), SpO2 95 %.  I/O:    Intake/Output Summary (Last 24 hours) at 10/10/16 1559 Last data filed at 10/10/16 1522  Gross per 24 hour  Intake             1320 ml  Output                0 ml  Net             1320 ml    PHYSICAL EXAMINATION:  GENERAL:  83 y.o.-year-old patient lying in the bed with no acute distress.  EYES: Pupils equal, round, reactive to light and accommodation. No scleral icterus. Extraocular muscles intact.  HEENT: Head atraumatic, normocephalic. Oropharynx and nasopharynx clear.  NECK:  Supple, no jugular venous distention. No thyroid enlargement,  no tenderness.  LUNGS: Moderate breath sounds bilaterally, no wheezing, rales,rhonchi or crepitation. No use of accessory muscles of respiration.  CARDIOVASCULAR: S1, S2 normal. No murmurs, rubs, or gallops.  ABDOMEN: Soft, non-tender, non-distended. Bowel sounds present. No organomegaly or mass.  EXTREMITIES: No pedal edema, cyanosis, or clubbing.  NEUROLOGIC: Cranial nerves II through XII are intact. Muscle strength 5/5 in all extremities. Sensation intact. Gait not checked.  PSYCHIATRIC: The patient is alert and oriented x 3.  SKIN: No obvious rash, lesion, or ulcer.   DATA REVIEW:   CBC  Recent Labs Lab 10/06/16 0521  WBC 16.7*  HGB 11.6*  HCT 34.2*  PLT 176    Chemistries   Recent Labs Lab 10/06/16 0521 10/09/16 0450  NA 138  --   K 4.1  --   CL 103  --   CO2 31  --   GLUCOSE 167*  --   BUN 16  --   CREATININE 0.87 0.85  CALCIUM 8.3*  --     Cardiac Enzymes  Recent Labs Lab 10/05/16 1911  TROPONINI <0.03    Microbiology Results  Results for orders placed or performed during the hospital encounter of 10/05/16  Culture, sputum-assessment     Status: None   Collection Time: 10/06/16  9:12 AM  Result Value Ref Range Status   Specimen Description SPUTUM  Final   Special Requests NONE  Final   Sputum evaluation THIS SPECIMEN IS ACCEPTABLE FOR SPUTUM CULTURE  Final   Report Status  10-27-202018 FINAL  Final  Culture, respiratory (NON-Expectorated)     Status: None   Collection Time: 10/06/16  9:12 AM  Result Value Ref Range Status   Specimen Description SPUTUM  Final   Special Requests NONE Reflexed from [id number]  Final   Gram Stain   Final    MODERATE WBC PRESENT, PREDOMINANTLY PMN ABUNDANT GRAM POSITIVE COCCI IN PAIRS FEW GRAM POSITIVE COCCI IN CHAINS Performed at [facility], [place] [address], [place],  [address]    Culture MULTIPLE ORGANISMS PRESENT, NONE PREDOMINANT  Final   Report Status 10/08/2016 FINAL  Final  MRSA PCR Screening     Status: None   Collection Time: 10/09/16  6:09 PM  Result Value Ref Range Status   MRSA by PCR NEGATIVE NEGATIVE Final    Comment:        The GeneXpert MRSA Assay (FDA approved for NASAL specimens only), is one component of a comprehensive MRSA colonization surveillance program. It is not intended to diagnose MRSA infection nor to guide or monitor treatment for MRSA infections.     RADIOLOGY:  Dg Chest 2 View  Result Date: 10/09/2016 CLINICAL DATA:  Shortness of breath and cough.  Home oxygen. EXAM: CHEST  2 VIEW COMPARISON:  10/08/2016 and 10/05/2016 FINDINGS: Again noted are coarse interstitial lung markings probably representing chronic changes. Stable cardiomegaly. Post CABG changes. Atherosclerotic calcifications at the aortic arch. Patient has multiple compression deformities in the spine with previous vertebral augmentation procedures. No large pleural effusions. IMPRESSION: Coarse lung markings suggesting chronic changes.  No acute findings. Stable cardiomegaly. Electronically Signed   By: Markus Daft M.D.   On: 10/09/2016 17:24   Dg Chest 2 View  Result Date: 10/08/2016 CLINICAL DATA:  Shortness of breath. Pneumonia. History of COPD, myocardial infarction. EXAM: CHEST  2 VIEW COMPARISON:  10/05/2016 and 06/14/2016 radiographs.  CT 08/27/2015. FINDINGS: Stable cardiomegaly and aortic atherosclerosis post  CABG. Diffusely accentuated interstitial markings are again noted throughout both lungs.  Current appearance is most consistent with fibrosis. No definite superimposed edema, focal airspace disease or pleural effusion. The bones are demineralized. 3 level spinal augmentation has been performed. IMPRESSION: Diffusely accentuated interstitial markings, most consistent with fibrosis superimposed on emphysema. Stable cardiomegaly post CABG. Electronically Signed   By: Richardean Sale M.D.   On: 10/08/2016 15:13   Dg Abd 1 View  Result Date: 10/09/2016 CLINICAL DATA:  Weakness EXAM: ABDOMEN - 1 VIEW COMPARISON:  None. FINDINGS: Scattered large and small bowel gas is noted. No obstructive changes are seen. No free air is noted. Evidence of prior vertebral augmentation is noted at multiple levels. No acute bony abnormality is seen. IMPRESSION: No acute abnormality noted. Electronically Signed   By: Inez Catalina M.D.   On: 10/09/2016 17:23    EKG:   Orders placed or performed during the hospital encounter of 08/26/15  . EKG 12-Lead  . EKG 12-Lead  . ED EKG  . ED EKG  . EKG      Management plans discussed with the patient, family and they are in agreement.  CODE STATUS:     Code Status Orders        Start     Ordered   10/05/16 2303  Full code  Continuous     10/05/16 2302    Code Status History    Date Active Date Inactive Code Status Order ID Comments User Context   06/10/2016 10:32 AM 06/10/2016 10:50 AM Full Code [id number]  Theodoro Grist, MD Inpatient   09/14/2015  8:19 PM 09/16/2015  8:34 PM Full Code [id number]  Theodoro Grist, MD Inpatient   08/27/2015  8:05 AM 09/01/2015  8:42 PM Full Code [id number]  Harrie Foreman, MD Inpatient      TOTAL TIME TAKING CARE OF THIS PATIENT: 45 minutes.   Note: This dictation was prepared with Dragon dictation along with smaller phrase technology. Any transcriptional errors that result from this process are unintentional.   '@MEC'$ @  on 10/10/2016 at  3:59 PM  Between 7am to 6pm - Pager - [phone]  After 6pm go to [email or web address] - password [email or web address] [facility]  Office  [phone]  CC: Primary care physician; Volanda Napoleon, MD   He is and

## 2016-10-10 NOTE — Progress Notes (Signed)
Patient is medically stable for D/C to [facility] today. Per [facility] admissions coordinator at [facility] patient will go to room 410. RN will call report and arrange EMS for transport. Clinical Education officer, museum (CSW) sent D/C orders to [employer] via [place]. Patient is aware of above. CSW contacted patient's son Nadara Mustard and made him aware of above. Please reconsult if future social work needs arise. CSW signing off.   [employer], LCSW [phone]

## 2016-10-10 NOTE — Progress Notes (Signed)
Pt left via EMS at 2035. Pt did not get complete set of vitals because the transfer crew reported they were the emergency crew and so they operate a little different. Pt was about to be changed but on arrival to room, pt was already on the stretcher. Pt report it was alright. No signs of distress noted.

## 2016-10-10 NOTE — Discharge Instructions (Signed)
Follow-up with primary care physician in a week Follow-up with pulmonology in 2 weeks

## 2016-10-10 NOTE — Plan of Care (Signed)
Problem: Activity: Goal: Risk for activity intolerance will decrease Outcome: Not Progressing Pt continues to not want to get out of bed.  Slept most of shift

## 2016-10-10 NOTE — Clinical Social Work Placement (Signed)
   CLINICAL SOCIAL WORK PLACEMENT  NOTE  Date:  10/10/2016  Patient Details  Name: Debra [name] MRN: [id number] Date of Birth: 13-Oct-1932  Clinical Social Work is seeking post-discharge placement for this patient at the [place] level of care (*CSW will initial, date and re-position this form in  chart as items are completed):  Yes   Patient/family provided with [facility]'s list of facilities offering this level of care within the geographic area requested by the patient (or if unable, by the patient's family).  Yes   Patient/family informed of their freedom to choose among providers that offer the needed level of care, that participate in Medicare, Medicaid or managed care program needed by the patient, have an available bed and are willing to accept the patient.  Yes   Patient/family informed of [facility]'s ownership interest in [facility] and [facility], as well as of the fact that they are under no obligation to receive care at these facilities.  PASRR submitted to EDS on       PASRR number received on       Existing PASRR number confirmed on 10/08/16     FL2 transmitted to all facilities in geographic area requested by pt/family on 10/08/16     FL2 transmitted to all facilities within larger geographic area on       Patient informed that his/her managed care company has contracts with or will negotiate with certain facilities, including the following:        Yes   Patient/family informed of bed offers received.  Patient chooses bed at  [facility] )     Physician recommends and patient chooses bed at      Patient to be transferred to  [facility] ) on 10/10/16.  Patient to be transferred to facility by  [facility] )     Patient family notified on 10/10/16 of transfer.  Name of family member notified:   (Patient's son Nadara Mustard is aware of D/C today. )     PHYSICIAN       Additional  Comment:    _______________________________________________ Blanton Kardell, Veronia Beets, LCSW 10/10/2016, 4:17 PM

## 2016-12-05 ENCOUNTER — Inpatient Hospital Stay
Admission: EM | Admit: 2016-12-05 | Discharge: 2016-12-08 | DRG: 291 | Disposition: A | Discharge status: Home Health | Payer: Medicare Other | Attending: Internal Medicine | Admitting: Internal Medicine

## 2016-12-05 ENCOUNTER — Emergency Department: Payer: Medicare Other

## 2016-12-05 ENCOUNTER — Encounter: Payer: Self-pay | Admitting: Emergency Medicine

## 2016-12-05 DIAGNOSIS — Z7902 Long term (current) use of antithrombotics/antiplatelets: Secondary | ICD-10-CM

## 2016-12-05 DIAGNOSIS — Z9049 Acquired absence of other specified parts of digestive tract: Secondary | ICD-10-CM | POA: Diagnosis not present

## 2016-12-05 DIAGNOSIS — I110 Hypertensive heart disease with heart failure: Principal | ICD-10-CM | POA: Diagnosis present

## 2016-12-05 DIAGNOSIS — Z885 Allergy status to narcotic agent status: Secondary | ICD-10-CM

## 2016-12-05 DIAGNOSIS — Z9981 Dependence on supplemental oxygen: Secondary | ICD-10-CM | POA: Diagnosis not present

## 2016-12-05 DIAGNOSIS — Z791 Long term (current) use of non-steroidal anti-inflammatories (NSAID): Secondary | ICD-10-CM | POA: Diagnosis not present

## 2016-12-05 DIAGNOSIS — Z7982 Long term (current) use of aspirin: Secondary | ICD-10-CM | POA: Diagnosis not present

## 2016-12-05 DIAGNOSIS — Z79891 Long term (current) use of opiate analgesic: Secondary | ICD-10-CM

## 2016-12-05 DIAGNOSIS — Z951 Presence of aortocoronary bypass graft: Secondary | ICD-10-CM | POA: Diagnosis not present

## 2016-12-05 DIAGNOSIS — Z9114 Patient's other noncompliance with medication regimen: Secondary | ICD-10-CM | POA: Diagnosis not present

## 2016-12-05 DIAGNOSIS — Z66 Do not resuscitate: Secondary | ICD-10-CM | POA: Diagnosis present

## 2016-12-05 DIAGNOSIS — J9610 Chronic respiratory failure, unspecified whether with hypoxia or hypercapnia: Secondary | ICD-10-CM | POA: Diagnosis present

## 2016-12-05 DIAGNOSIS — Z8249 Family history of ischemic heart disease and other diseases of the circulatory system: Secondary | ICD-10-CM

## 2016-12-05 DIAGNOSIS — J189 Pneumonia, unspecified organism: Secondary | ICD-10-CM | POA: Diagnosis present

## 2016-12-05 DIAGNOSIS — Z90710 Acquired absence of both cervix and uterus: Secondary | ICD-10-CM | POA: Diagnosis not present

## 2016-12-05 DIAGNOSIS — F17200 Nicotine dependence, unspecified, uncomplicated: Secondary | ICD-10-CM | POA: Diagnosis present

## 2016-12-05 DIAGNOSIS — J441 Chronic obstructive pulmonary disease with (acute) exacerbation: Secondary | ICD-10-CM | POA: Diagnosis present

## 2016-12-05 DIAGNOSIS — I5023 Acute on chronic systolic (congestive) heart failure: Secondary | ICD-10-CM | POA: Diagnosis present

## 2016-12-05 DIAGNOSIS — R011 Cardiac murmur, unspecified: Secondary | ICD-10-CM | POA: Diagnosis present

## 2016-12-05 DIAGNOSIS — F329 Major depressive disorder, single episode, unspecified: Secondary | ICD-10-CM | POA: Diagnosis present

## 2016-12-05 DIAGNOSIS — J8410 Pulmonary fibrosis, unspecified: Secondary | ICD-10-CM | POA: Diagnosis present

## 2016-12-05 DIAGNOSIS — Z79899 Other long term (current) drug therapy: Secondary | ICD-10-CM

## 2016-12-05 DIAGNOSIS — F419 Anxiety disorder, unspecified: Secondary | ICD-10-CM | POA: Diagnosis present

## 2016-12-05 DIAGNOSIS — I252 Old myocardial infarction: Secondary | ICD-10-CM | POA: Diagnosis not present

## 2016-12-05 DIAGNOSIS — I509 Heart failure, unspecified: Secondary | ICD-10-CM

## 2016-12-05 DIAGNOSIS — R0902 Hypoxemia: Secondary | ICD-10-CM | POA: Diagnosis not present

## 2016-12-05 LAB — CBC
HEMATOCRIT: 32.6 % — AB (ref 35.0–47.0)
Hemoglobin: 11.1 g/dL — ABNORMAL LOW (ref 12.0–16.0)
MCH: 35.4 pg — AB (ref 26.0–34.0)
MCHC: 33.9 g/dL (ref 32.0–36.0)
MCV: 104.4 fL — AB (ref 80.0–100.0)
PLATELETS: 227 10*3/uL (ref 150–440)
RBC: 3.13 MIL/uL — AB (ref 3.80–5.20)
RDW: 14.4 % (ref 11.5–14.5)
WBC: 6.6 10*3/uL (ref 3.6–11.0)

## 2016-12-05 LAB — COMPREHENSIVE METABOLIC PANEL
ALK PHOS: 40 U/L (ref 38–126)
ALT: 9 U/L — AB (ref 14–54)
AST: 17 U/L (ref 15–41)
Albumin: 3.7 g/dL (ref 3.5–5.0)
Anion gap: 3 — ABNORMAL LOW (ref 5–15)
BILIRUBIN TOTAL: 0.7 mg/dL (ref 0.3–1.2)
BUN: 18 mg/dL (ref 6–20)
CALCIUM: 8.8 mg/dL — AB (ref 8.9–10.3)
CHLORIDE: 100 mmol/L — AB (ref 101–111)
CO2: 37 mmol/L — ABNORMAL HIGH (ref 22–32)
Creatinine, Ser: 0.81 mg/dL (ref 0.44–1.00)
Glucose, Bld: 101 mg/dL — ABNORMAL HIGH (ref 65–99)
Potassium: 3.7 mmol/L (ref 3.5–5.1)
Sodium: 140 mmol/L (ref 135–145)
TOTAL PROTEIN: 6.8 g/dL (ref 6.5–8.1)

## 2016-12-05 LAB — BRAIN NATRIURETIC PEPTIDE: B Natriuretic Peptide: 767 pg/mL — ABNORMAL HIGH (ref 0.0–100.0)

## 2016-12-05 LAB — TROPONIN I: Troponin I: <0.03 ng/mL (ref <0.03)

## 2016-12-05 MED ORDER — TRAZODONE HCL 50 MG PO TABS
50.0000 mg | ORAL_TABLET | Freq: Every day | ORAL | Status: DC
  Administered 2016-12-05 – 2016-12-07 (×3): 50 mg via ORAL
  Filled 2016-12-05 (×3): qty 1

## 2016-12-05 MED ORDER — ONDANSETRON HCL 4 MG/2ML IJ SOLN: 4.0000 mg | Freq: Four times a day (QID) | INTRAMUSCULAR | Status: DC | PRN

## 2016-12-05 MED ORDER — IPRATROPIUM-ALBUTEROL 0.5-2.5 (3) MG/3ML IN SOLN
3.0000 mL | Freq: Once | RESPIRATORY_TRACT | Status: AC
  Administered 2016-12-05: 3 mL via RESPIRATORY_TRACT
  Filled 2016-12-05: qty 3

## 2016-12-05 MED ORDER — METHYLPREDNISOLONE SODIUM SUCC 125 MG IJ SOLR
125.0000 mg | Freq: Once | INTRAMUSCULAR | Status: AC
  Administered 2016-12-05: 125 mg via INTRAVENOUS
  Filled 2016-12-05: qty 2

## 2016-12-05 MED ORDER — BISACODYL 10 MG RE SUPP
10.0000 mg | Freq: Every day | RECTAL | Status: DC | PRN
  Filled 2016-12-05: qty 1

## 2016-12-05 MED ORDER — IPRATROPIUM-ALBUTEROL 0.5-2.5 (3) MG/3ML IN SOLN
3.0000 mL | RESPIRATORY_TRACT | Status: DC
  Administered 2016-12-05 – 2016-12-07 (×13): 3 mL via RESPIRATORY_TRACT
  Filled 2016-12-05 (×12): qty 3

## 2016-12-05 MED ORDER — LORATADINE 10 MG PO TABS
10.0000 mg | ORAL_TABLET | Freq: Every day | ORAL | Status: DC
  Administered 2016-12-05 – 2016-12-08 (×4): 10 mg via ORAL
  Filled 2016-12-05 (×4): qty 1

## 2016-12-05 MED ORDER — BENZONATATE 100 MG PO CAPS
100.0000 mg | ORAL_CAPSULE | Freq: Three times a day (TID) | ORAL | Status: DC | PRN
  Administered 2016-12-07: 100 mg via ORAL
  Filled 2016-12-05: qty 1

## 2016-12-05 MED ORDER — ONDANSETRON HCL 4 MG PO TABS: 4.0000 mg | ORAL_TABLET | Freq: Four times a day (QID) | ORAL | Status: DC | PRN

## 2016-12-05 MED ORDER — HYDRALAZINE HCL 25 MG PO TABS
25.0000 mg | ORAL_TABLET | Freq: Three times a day (TID) | ORAL | Status: DC
  Administered 2016-12-05 – 2016-12-08 (×9): 25 mg via ORAL
  Filled 2016-12-05 (×9): qty 1

## 2016-12-05 MED ORDER — POTASSIUM CHLORIDE CRYS ER 20 MEQ PO TBCR
20.0000 meq | EXTENDED_RELEASE_TABLET | Freq: Two times a day (BID) | ORAL | Status: DC
  Administered 2016-12-05 – 2016-12-07 (×6): 20 meq via ORAL
  Filled 2016-12-05 (×6): qty 1

## 2016-12-05 MED ORDER — FLUOXETINE HCL 20 MG PO CAPS
20.0000 mg | ORAL_CAPSULE | Freq: Every day | ORAL | Status: DC
  Administered 2016-12-05 – 2016-12-08 (×4): 20 mg via ORAL
  Filled 2016-12-05 (×4): qty 1

## 2016-12-05 MED ORDER — ENOXAPARIN SODIUM 40 MG/0.4ML ~~LOC~~ SOLN
40.0000 mg | SUBCUTANEOUS | Status: DC
  Administered 2016-12-05 – 2016-12-07 (×3): 40 mg via SUBCUTANEOUS
  Filled 2016-12-05 (×3): qty 0.4

## 2016-12-05 MED ORDER — MOMETASONE FURO-FORMOTEROL FUM 100-5 MCG/ACT IN AERO
2.0000 | INHALATION_SPRAY | Freq: Two times a day (BID) | RESPIRATORY_TRACT | Status: DC
  Administered 2016-12-05 – 2016-12-08 (×6): 2 via RESPIRATORY_TRACT
  Filled 2016-12-05: qty 8.8

## 2016-12-05 MED ORDER — METHYLPREDNISOLONE SODIUM SUCC 125 MG IJ SOLR
60.0000 mg | Freq: Two times a day (BID) | INTRAMUSCULAR | Status: DC
  Administered 2016-12-05 – 2016-12-07 (×4): 60 mg via INTRAVENOUS
  Filled 2016-12-05 (×4): qty 2

## 2016-12-05 MED ORDER — CLOPIDOGREL BISULFATE 75 MG PO TABS
75.0000 mg | ORAL_TABLET | ORAL | Status: DC
  Administered 2016-12-05 – 2016-12-08 (×5): 75 mg via ORAL
  Filled 2016-12-05 (×5): qty 1

## 2016-12-05 MED ORDER — DOCUSATE SODIUM 100 MG PO CAPS: 100.0000 mg | ORAL_CAPSULE | Freq: Two times a day (BID) | ORAL | Status: DC | PRN

## 2016-12-05 MED ORDER — HYDROCODONE-ACETAMINOPHEN 5-325 MG PO TABS
1.0000 | ORAL_TABLET | Freq: Four times a day (QID) | ORAL | Status: DC | PRN
  Administered 2016-12-05 – 2016-12-06 (×3): 1 via ORAL
  Filled 2016-12-05 (×3): qty 1

## 2016-12-05 MED ORDER — GUAIFENESIN-DM 100-10 MG/5ML PO SYRP: 10.0000 mL | ORAL_SOLUTION | Freq: Four times a day (QID) | ORAL | Status: DC | PRN

## 2016-12-05 MED ORDER — SODIUM CHLORIDE 0.9% FLUSH
3.0000 mL | Freq: Two times a day (BID) | INTRAVENOUS | Status: DC
  Administered 2016-12-05 – 2016-12-08 (×3): 3 mL via INTRAVENOUS

## 2016-12-05 MED ORDER — POLYETHYLENE GLYCOL 3350 17 G PO PACK: 17.0000 g | PACK | Freq: Every day | ORAL | Status: DC | PRN

## 2016-12-05 MED ORDER — TIOTROPIUM BROMIDE MONOHYDRATE 18 MCG IN CAPS
18.0000 ug | ORAL_CAPSULE | Freq: Every morning | RESPIRATORY_TRACT | Status: DC
  Administered 2016-12-05 – 2016-12-08 (×4): 18 ug via RESPIRATORY_TRACT
  Filled 2016-12-05: qty 5

## 2016-12-05 MED ORDER — METOPROLOL TARTRATE 25 MG PO TABS
25.0000 mg | ORAL_TABLET | Freq: Two times a day (BID) | ORAL | Status: DC
  Administered 2016-12-05 – 2016-12-08 (×7): 25 mg via ORAL
  Filled 2016-12-05 (×7): qty 1

## 2016-12-05 MED ORDER — ATORVASTATIN CALCIUM 10 MG PO TABS
10.0000 mg | ORAL_TABLET | Freq: Every evening | ORAL | Status: DC
  Administered 2016-12-05 – 2016-12-07 (×3): 10 mg via ORAL
  Filled 2016-12-05 (×3): qty 1

## 2016-12-05 MED ORDER — SODIUM CHLORIDE 0.9 % IV SOLN: 250.0000 mL | INTRAVENOUS | Status: DC | PRN

## 2016-12-05 MED ORDER — ACETAMINOPHEN 325 MG PO TABS: 650.0000 mg | ORAL_TABLET | Freq: Four times a day (QID) | ORAL | Status: DC | PRN

## 2016-12-05 MED ORDER — SODIUM CHLORIDE 0.9% FLUSH
3.0000 mL | Freq: Two times a day (BID) | INTRAVENOUS | Status: DC
  Administered 2016-12-05 – 2016-12-08 (×7): 3 mL via INTRAVENOUS

## 2016-12-05 MED ORDER — ASPIRIN 81 MG PO CHEW
81.0000 mg | CHEWABLE_TABLET | Freq: Every day | ORAL | Status: DC
  Administered 2016-12-05 – 2016-12-08 (×4): 81 mg via ORAL
  Filled 2016-12-05 (×4): qty 1

## 2016-12-05 MED ORDER — FUROSEMIDE 10 MG/ML IJ SOLN
40.0000 mg | Freq: Two times a day (BID) | INTRAMUSCULAR | Status: DC
  Administered 2016-12-05 – 2016-12-07 (×5): 40 mg via INTRAVENOUS
  Filled 2016-12-05 (×6): qty 4

## 2016-12-05 MED ORDER — ACETAMINOPHEN 650 MG RE SUPP: 650.0000 mg | Freq: Four times a day (QID) | RECTAL | Status: DC | PRN

## 2016-12-05 MED ORDER — ALBUTEROL SULFATE (2.5 MG/3ML) 0.083% IN NEBU: 2.5000 mg | INHALATION_SOLUTION | RESPIRATORY_TRACT | Status: DC | PRN

## 2016-12-05 MED ORDER — LATANOPROST 0.005 % OP SOLN
1.0000 [drp] | Freq: Every day | OPHTHALMIC | Status: DC
  Administered 2016-12-05 – 2016-12-07 (×3): 1 [drp] via OPHTHALMIC
  Filled 2016-12-05: qty 2.5

## 2016-12-05 MED ORDER — TIZANIDINE HCL 4 MG PO TABS: 2.0000 mg | ORAL_TABLET | Freq: Three times a day (TID) | ORAL | Status: DC | PRN

## 2016-12-05 MED ORDER — SODIUM CHLORIDE 0.9% FLUSH: 3.0000 mL | INTRAVENOUS | Status: DC | PRN

## 2016-12-05 MED ORDER — PANTOPRAZOLE SODIUM 40 MG PO TBEC
40.0000 mg | DELAYED_RELEASE_TABLET | ORAL | Status: DC
  Administered 2016-12-06 – 2016-12-08 (×4): 40 mg via ORAL
  Filled 2016-12-05 (×4): qty 1

## 2016-12-05 MED ORDER — ALPRAZOLAM 0.5 MG PO TABS
0.5000 mg | ORAL_TABLET | Freq: Two times a day (BID) | ORAL | Status: DC | PRN
  Administered 2016-12-05 – 2016-12-07 (×3): 0.5 mg via ORAL
  Filled 2016-12-05 (×3): qty 1

## 2016-12-05 MED ORDER — ALBUTEROL SULFATE (2.5 MG/3ML) 0.083% IN NEBU: 5.0000 mg | INHALATION_SOLUTION | Freq: Once | RESPIRATORY_TRACT | Status: DC

## 2016-12-05 NOTE — ED Notes (Signed)
RN attempted to call report. Receiving RN currently in an isolation room.

## 2016-12-05 NOTE — ED Notes (Signed)
RN unable to measure urine output due to pt using her pull up to urinate. RN again encouraged the pt to call out before her need to urinate so that a bed pan may be used.

## 2016-12-05 NOTE — Care Management (Signed)
Admitted from home with exac copd and CHF.  Chronic 02 through [facility].  Patient's granddaughter lives with patient and is "in and out."  Patient has access to a walker and denies issues obtaining meds and getting to MD appointments.  Obtained order for physical therapy. If patient requires home health, she would like "my agency from before."  It appears this would have been [facility].  Called agency to inquire if patient could be accepted with her current insurance coverage. She verbalizes understanding that if she discharges directly hoe that her portable 02 tanks must be brought to hospital for transport home

## 2016-12-05 NOTE — ED Provider Notes (Signed)
[facility] Emergency Department Provider Note  Time seen: 8:20 AM  I have reviewed the triage vital signs and the nursing notes.   HISTORY  Chief Complaint Shortness of Breath    HPI Debra [name] is a 83 y.o. female with a past medical history of asthma, COPD, MI, presents to the emergency department with difficulty breathing. According to the patient she has been having shortness of breath all night. Patient has COPD at baseline usually wears 2 or 3 L of oxygen. States she has been coughing with sputum production. Patient is a daily smoker. Denies fever, abdominal pain, chest pain, nausea, vomiting. Patient does state increased cough over baseline.  Past Medical History:  Diagnosis Date  . Asthma   . COPD (chronic obstructive pulmonary disease) ([place])   . Myocardial infarction     Patient Active Problem List   Diagnosis Date Noted  . CAP (community acquired pneumonia) 10/05/2016  . Chronic systolic heart failure ([place]) 06/28/2016  . COPD (chronic obstructive pulmonary disease) with emphysema ([place]) 06/28/2016  . Tobacco use 06/28/2016  . Acidosis 09/14/2015  . Hypotension 09/14/2015  . Hypokalemia 09/14/2015  . COPD exacerbation ([place]) 09/01/2015  . Sepsis ([place]) 08/27/2015    Past Surgical History:  Procedure Laterality Date  . ABDOMINAL HYSTERECTOMY    . BREAST SURGERY    . BYPASS GRAFT  2007   triple  . CHOLECYSTECTOMY    . TONSILLECTOMY      Prior to Admission medications   Medication Sig Start Date End Date Taking? Authorizing Provider  acetaminophen (TYLENOL) 325 MG tablet Take 2 tablets (650 mg total) by mouth every 6 (six) hours as needed for mild pain (or Fever >/= 101). 10/10/16   Nicholes Mango, MD  albuterol (PROVENTIL HFA;VENTOLIN HFA) 108 (90 Base) MCG/ACT inhaler Inhale 2 puffs into the lungs every 6 (six) hours as needed for wheezing or shortness of breath.    Historical Provider, MD  albuterol (PROVENTIL) (2.5 MG/3ML) 0.083%  nebulizer solution Take 2.5 mg by nebulization 4 (four) times daily as needed for wheezing or shortness of breath.    Historical Provider, MD  ALPRAZolam Duanne Moron) 0.5 MG tablet Take 1 tablet (0.5 mg total) by mouth 2 (two) times daily. 10/10/16   Nicholes Mango, MD  amoxicillin-clavulanate (AUGMENTIN) 875-125 MG tablet Take 1 tablet by mouth 2 (two) times daily. 10/10/16   Nicholes Mango, MD  aspirin 81 MG chewable tablet Chew by mouth every morning.    Historical Provider, MD  atorvastatin (LIPITOR) 10 MG tablet Take 10 mg by mouth every evening. 05/16/16   Historical Provider, MD  bisacodyl (DULCOLAX) 10 MG suppository Place 0.5 suppositories (5 mg total) rectally daily as needed for moderate constipation. 10/10/16   Nicholes Mango, MD  Calcium Carbonate-Vitamin D (OYSTERCAL 500 + D PO) Take 1 tablet by mouth 2 (two) times daily.    Historical Provider, MD  clopidogrel (PLAVIX) 75 MG tablet Take 75 mg by mouth every morning.     Historical Provider, MD  diclofenac sodium (VOLTAREN) 1 % GEL Apply 2 g topically 2 (two) times daily as needed (for pain).    Historical Provider, MD  docusate sodium (COLACE) 50 MG capsule Take 50 mg by mouth 2 (two) times daily.    Historical Provider, MD  fexofenadine (ALLEGRA) 180 MG tablet Take 180 mg by mouth daily.    Historical Provider, MD  FLUoxetine (PROZAC) 20 MG capsule Take 1 capsule (20 mg total) by mouth daily. 09/01/15   Barnetta Chapel  Christen Butter, MD  furosemide (LASIX) 40 MG tablet Take 1 tablet (40 mg total) by mouth daily. 06/15/16   Sital Mody, MD  guaiFENesin-dextromethorphan (ROBITUSSIN DM) 100-10 MG/5ML syrup Take 10 mLs by mouth every 6 (six) hours as needed for cough. 10/10/16   Nicholes Mango, MD  hydrALAZINE (APRESOLINE) 25 MG tablet Take 1 tablet (25 mg total) by mouth every 8 (eight) hours. 10/10/16   Nicholes Mango, MD  HYDROcodone-acetaminophen (NORCO/VICODIN) 5-325 MG tablet Take 1 tablet by mouth every 6 (six) hours as needed for moderate pain. 10/10/16   Nicholes Mango, MD   ipratropium-albuterol (DUONEB) 0.5-2.5 (3) MG/3ML SOLN Take 3 mLs by nebulization every 4 (four) hours as needed. 10/10/16   Nicholes Mango, MD  latanoprost (XALATAN) 0.005 % ophthalmic solution Place 1 drop into both eyes at bedtime. 05/10/16   Historical Provider, MD  losartan (COZAAR) 25 MG tablet Take 25 mg by mouth daily.    Historical Provider, MD  metoprolol (LOPRESSOR) 25 MG tablet Take 1 tablet (25 mg total) by mouth 2 (two) times daily. 10/10/16   Nicholes Mango, MD  mometasone-formoterol (DULERA) 100-5 MCG/ACT AERO Inhale 2 puffs into the lungs 2 (two) times daily. 09/01/15   Aldean Jewett, MD  pantoprazole (PROTONIX) 40 MG tablet Take 40 mg by mouth every morning.     Historical Provider, MD  predniSONE (STERAPRED UNI-PAK 21 TAB) 10 MG (21) TBPK tablet Take 1 tablet (10 mg total) by mouth daily. Take 6 tablets by mouth for 1 day followed by  5 tablets by mouth for 1 day followed by  4 tablets by mouth for 1 day followed by  3 tablets by mouth for 1 day followed by  2 tablets by mouth for 1 day followed by  1 tablet by mouth for a day and stop 10/10/16   Nicholes Mango, MD  saccharomyces boulardii (FLORASTOR) 250 MG capsule Take 1 capsule (250 mg total) by mouth 2 (two) times daily. 10/10/16   Nicholes Mango, MD  tiotropium (SPIRIVA HANDIHALER) 18 MCG inhalation capsule Place 1 capsule (18 mcg total) into inhaler and inhale every morning. 09/01/15   Aldean Jewett, MD  tiZANidine (ZANAFLEX) 2 MG tablet Take 2 mg by mouth 3 (three) times daily as needed for muscle spasms.     Historical Provider, MD  traZODone (DESYREL) 50 MG tablet Take 50 mg by mouth at bedtime.     Historical Provider, MD    Allergies  Allergen Reactions  . Codeine Itching and Rash    Family History  Problem Relation Age of Onset  . CAD Father     Social History Social History  Substance Use Topics  . Smoking status: Current Every Day Smoker    Packs/day: 0.50    Years: 55.00  . Smokeless tobacco: Never Used      Comment: quit smoking 06-08-16  . Alcohol use No    Review of Systems Constitutional: Negative for fever Cardiovascular: Negative for chest pain. Respiratory:Moderate shortness of breath. Gastrointestinal: Negative for abdominal pain Neurological: Negative for headache 10-point ROS otherwise negative.  ____________________________________________   PHYSICAL EXAM:  VITAL SIGNS: ED Triage Vitals  Enc Vitals Group     BP 12/05/16 0814 (!) 129/102     Pulse Rate 12/05/16 0814 75     Resp 12/05/16 0814 (!) 24     Temp 12/05/16 0814 98 F (36.7 C)     Temp Source 12/05/16 0814 Oral     SpO2 12/05/16 0809 94 %  Weight 12/05/16 0815 163 lb (73.9 kg)     Height 12/05/16 0815 '5\' 4"'$  (1.626 m)     Head Circumference --      Peak Flow --      Pain Score 12/05/16 0812 0     Pain Loc --      Pain Edu? --      Excl. in [place]? --     Constitutional: Alert and oriented. Well appearing and in no distress. Eyes: Normal exam ENT   Head: Normocephalic and atraumatic.   Mouth/Throat: Mucous membranes are moist. Cardiovascular: Normal rate, regular rhythm. No murmur Respiratory: Mild tachypnea. Mild expiratory wheeze bilaterally. No rales or rhonchi. Gastrointestinal: Soft and nontender. No distention.   Musculoskeletal: Nontender with normal range of motion in all extremities. No lower extremity tenderness or edema. Neurologic:  Normal speech and language. No gross focal neurologic deficits  Skin:  Skin is warm, dry and intact.  Psychiatric: Mood and affect are normal.   ____________________________________________    EKG  EKG reviewed and interpreted by myself shows normal sinus rhythm at 87 bpm, narrow QRS, normal axis, normal intervals, nonspecific but no concerning ST changes.  ____________________________________________    RADIOLOGY  Chest x-ray shows congestive heart failure/edema superimposed on  fibrosis.  ____________________________________________   INITIAL IMPRESSION / ASSESSMENT AND PLAN / ED COURSE  Pertinent labs & imaging results that were available during my care of the patient were reviewed by me and considered in my medical decision making (see chart for details).  He presents the emergency department with shortness of breath starting overnight last night. Patient continues to say shortness of breath currently. She is satting 93-94 percent on 3 L which is her baseline. Patient does have mild expiratory wheezes bilaterally. We will treat with Solu-Medrol and DuoNeb's in the emergency department while awaiting lab results.  His x-ray consistent with interstitial edema. BNP is elevated to 700, patient currently has a 90% oxygen saturation on 2 L which is her baseline. Given the hypoxia with interstitial edema we'll the patient Lasix and admitted to the hospital.  ____________________________________________   FINAL CLINICAL IMPRESSION(S) / ED DIAGNOSES  Dyspnea CHF Exacerbation    Harvest Dark, MD 12/05/16 1051

## 2016-12-05 NOTE — ED Notes (Signed)
RN reminded pt to call out when she needs to urinate. Pt cleaned and changed.

## 2016-12-05 NOTE — ED Triage Notes (Signed)
Pt to ED by EMS from home with c/o SOB. Pt on 3L at home, hx of COPD and asthma. Pt has productive cough with thick white sputum.

## 2016-12-05 NOTE — Progress Notes (Signed)
Chaplain educated patient about an advance directives. Patient asked Chaplain if she needed to consult with her attorney before completing her Advance Directives. Chaplain explained that many people do their AD without taking the document to their attorneys because the document is written in a comprehensible language. Chaplain told patient to let her nurse know when she will be ready for her AD to be notarized.    12/05/16 1500  Clinical Encounter Type  Visited With Patient  Visit Type Initial  Referral From Nurse  Consult/Referral To Chaplain  Spiritual Encounters  Spiritual Needs Literature;Brochure

## 2016-12-05 NOTE — H&P (Signed)
[facility] [place] NAME: Debra [name]    MR#:  [id number]  DATE OF BIRTH:  [birth year]  DATE OF ADMISSION:  12/05/2016  PRIMARY CARE PHYSICIAN: Volanda Napoleon, MD   REQUESTING/REFERRING PHYSICIAN: Dr. Kerman Passey  CHIEF COMPLAINT:   Chief Complaint  Patient presents with  . Shortness of Breath    HISTORY OF PRESENT ILLNESS:  Debra [name]  is a 83 y.o. female with a known history of Chronic systolic CHF with ejection fraction 45%, COPD, anxiety, hypertension presents to the emergency room complaining of worsening shortness of breath since yesterday. She has also noticed orthopnea. No lower extremity edema. Has been wheezing and coughing clear frothy sputum. Complains of bilateral lower chest pain on coughing. She has not been taking her Lasix as she has to wake up frequently to go to the bathroom. He drinks plenty of fluids. Here in the emergency room patient has been given steroids and nebulizers. Her saturations are 84% on 2-3 L oxygen. She does wear 2-3 L oxygen at home. X-ray shows pulmonary edema. Patient is being admitted for COPD and CHF exacerbation. Her cardiologist is Dr. Humphrey Rolls and PCP is Dr. Elijio Miles  PAST MEDICAL HISTORY:   Past Medical History:  Diagnosis Date  . Asthma   . COPD (chronic obstructive pulmonary disease) ([facility])   . Myocardial infarction     PAST SURGICAL HISTORY:   Past Surgical History:  Procedure Laterality Date  . ABDOMINAL HYSTERECTOMY    . BREAST SURGERY    . BYPASS GRAFT  2007   triple  . CHOLECYSTECTOMY    . TONSILLECTOMY      SOCIAL HISTORY:   Social History  Substance Use Topics  . Smoking status: Current Every Day Smoker    Packs/day: 0.50    Years: 55.00  . Smokeless tobacco: Never Used     Comment: quit smoking 06-08-16  . Alcohol use No    FAMILY HISTORY:   Family History  Problem Relation Age of Onset  . CAD Father     DRUG ALLERGIES:   Allergies  Allergen  Reactions  . Codeine Itching and Rash    REVIEW OF SYSTEMS:   Review of Systems  Constitutional: Positive for malaise/fatigue. Negative for chills, fever and weight loss.  HENT: Negative for hearing loss and nosebleeds.   Eyes: Negative for blurred vision, double vision and pain.  Respiratory: Positive for cough, sputum production, shortness of breath and wheezing. Negative for hemoptysis.   Cardiovascular: Positive for chest pain and orthopnea. Negative for palpitations and leg swelling.  Gastrointestinal: Negative for abdominal pain, constipation, diarrhea, nausea and vomiting.  Genitourinary: Negative for dysuria and hematuria.  Musculoskeletal: Negative for back pain, falls and myalgias.  Skin: Negative for rash.  Neurological: Positive for weakness. Negative for dizziness, tremors, sensory change, speech change, focal weakness, seizures and headaches.  Endo/Heme/Allergies: Does not bruise/bleed easily.  Psychiatric/Behavioral: Negative for depression and memory loss. The patient is nervous/anxious.     MEDICATIONS AT HOME:   Prior to Admission medications   Medication Sig Start Date End Date Taking? Authorizing Provider  ALPRAZolam Duanne Moron) 0.5 MG tablet Take 1 tablet (0.5 mg total) by mouth 2 (two) times daily. Patient taking differently: Take 0.5 mg by mouth 2 (two) times daily as needed.  10/10/16  Yes Nicholes Mango, MD  aspirin 81 MG chewable tablet Chew by mouth every morning.   Yes Historical Provider, MD  atorvastatin (LIPITOR) 10 MG tablet Take 10 mg  by mouth every evening. 05/16/16  Yes Historical Provider, MD  benzonatate (TESSALON) 100 MG capsule Take by mouth 3 (three) times daily.   Yes Historical Provider, MD  Calcium Carbonate-Vitamin D (OYSTERCAL 500 + D PO) Take 1 tablet by mouth 2 (two) times daily.   Yes Historical Provider, MD  clopidogrel (PLAVIX) 75 MG tablet Take 75 mg by mouth every morning.    Yes Historical Provider, MD  fexofenadine (ALLEGRA) 180 MG tablet  Take 180 mg by mouth daily.   Yes Historical Provider, MD  FLUoxetine (PROZAC) 20 MG capsule Take 1 capsule (20 mg total) by mouth daily. 09/01/15  Yes Aldean Jewett, MD  hydrALAZINE (APRESOLINE) 25 MG tablet Take 1 tablet (25 mg total) by mouth every 8 (eight) hours. 10/10/16  Yes Nicholes Mango, MD  latanoprost (XALATAN) 0.005 % ophthalmic solution Place 1 drop into both eyes at bedtime. 05/10/16  Yes Historical Provider, MD  losartan (COZAAR) 25 MG tablet Take 25 mg by mouth daily.   Yes Historical Provider, MD  metoprolol (LOPRESSOR) 25 MG tablet Take 1 tablet (25 mg total) by mouth 2 (two) times daily. 10/10/16  Yes Nicholes Mango, MD  pantoprazole (PROTONIX) 40 MG tablet Take 40 mg by mouth every morning.    Yes Historical Provider, MD  tiotropium (SPIRIVA HANDIHALER) 18 MCG inhalation capsule Place 1 capsule (18 mcg total) into inhaler and inhale every morning. 09/01/15  Yes Aldean Jewett, MD  traZODone (DESYREL) 50 MG tablet Take 50 mg by mouth at bedtime.    Yes Historical Provider, MD  acetaminophen (TYLENOL) 325 MG tablet Take 2 tablets (650 mg total) by mouth every 6 (six) hours as needed for mild pain (or Fever >/= 101). 10/10/16   Nicholes Mango, MD  albuterol (PROVENTIL HFA;VENTOLIN HFA) 108 (90 Base) MCG/ACT inhaler Inhale 2 puffs into the lungs every 6 (six) hours as needed for wheezing or shortness of breath.    Historical Provider, MD  albuterol (PROVENTIL) (2.5 MG/3ML) 0.083% nebulizer solution Take 2.5 mg by nebulization 4 (four) times daily as needed for wheezing or shortness of breath.    Historical Provider, MD  amoxicillin-clavulanate (AUGMENTIN) 875-125 MG tablet Take 1 tablet by mouth 2 (two) times daily. Patient not taking: Reported on 12/05/2016 10/10/16   Nicholes Mango, MD  bisacodyl (DULCOLAX) 10 MG suppository Place 0.5 suppositories (5 mg total) rectally daily as needed for moderate constipation. 10/10/16   Nicholes Mango, MD  diclofenac sodium (VOLTAREN) 1 % GEL Apply 2 g  topically 2 (two) times daily as needed (for pain).    Historical Provider, MD  docusate sodium (COLACE) 50 MG capsule Take 50 mg by mouth daily as needed.     Historical Provider, MD  furosemide (LASIX) 40 MG tablet Take 1 tablet (40 mg total) by mouth daily. Patient not taking: Reported on 12/05/2016 06/15/16   Bettey Costa, MD  guaiFENesin-dextromethorphan (ROBITUSSIN DM) 100-10 MG/5ML syrup Take 10 mLs by mouth every 6 (six) hours as needed for cough. 10/10/16   Nicholes Mango, MD  HYDROcodone-acetaminophen (NORCO/VICODIN) 5-325 MG tablet Take 1 tablet by mouth every 6 (six) hours as needed for moderate pain. 10/10/16   Nicholes Mango, MD  ipratropium-albuterol (DUONEB) 0.5-2.5 (3) MG/3ML SOLN Take 3 mLs by nebulization every 4 (four) hours as needed. 10/10/16   Nicholes Mango, MD  mometasone-formoterol (DULERA) 100-5 MCG/ACT AERO Inhale 2 puffs into the lungs 2 (two) times daily. Patient not taking: Reported on 12/05/2016 09/01/15   Aldean Jewett, MD  predniSONE (  STERAPRED UNI-PAK 21 TAB) 10 MG (21) TBPK tablet Take 1 tablet (10 mg total) by mouth daily. Take 6 tablets by mouth for 1 day followed by  5 tablets by mouth for 1 day followed by  4 tablets by mouth for 1 day followed by  3 tablets by mouth for 1 day followed by  2 tablets by mouth for 1 day followed by  1 tablet by mouth for a day and stop Patient not taking: Reported on 12/05/2016 10/10/16   Nicholes Mango, MD  saccharomyces boulardii (FLORASTOR) 250 MG capsule Take 1 capsule (250 mg total) by mouth 2 (two) times daily. Patient not taking: Reported on 12/05/2016 10/10/16   Nicholes Mango, MD  tiZANidine (ZANAFLEX) 2 MG tablet Take 2 mg by mouth 3 (three) times daily as needed for muscle spasms.     Historical Provider, MD     VITAL SIGNS:  Blood pressure (!) 129/102, pulse 75, temperature 98 F (36.7 C), temperature source Oral, resp. rate (!) 24, height '5\' 4"'$  (1.626 m), weight 73.9 kg (163 lb), SpO2 96 %.  PHYSICAL EXAMINATION:  Physical  Exam  GENERAL:  83 y.o.-year-old patient lying in the bed, conversational dyspnea EYES: Pupils equal, round, reactive to light and accommodation. No scleral icterus. Extraocular muscles intact.  HEENT: Head atraumatic, normocephalic. Oropharynx and nasopharynx clear. No oropharyngeal erythema, moist oral mucosa  NECK:  Supple, no jugular venous distention. No thyroid enlargement, no tenderness.  LUNGS: Bilateral wheezing and crackles. Decreased air entry. CARDIOVASCULAR: S1, S2 normal. No murmurs, rubs, or gallops.  ABDOMEN: Soft, nontender, nondistended. Bowel sounds present. No organomegaly or mass.  EXTREMITIES: No pedal edema, cyanosis, or clubbing. + 2 pedal & radial pulses b/l.   NEUROLOGIC: Cranial nerves II through XII are intact. No focal Motor or sensory deficits appreciated b/l PSYCHIATRIC: The patient is alert and oriented x 3. Good affect.  SKIN: No obvious rash, lesion, or ulcer.   LABORATORY PANEL:   CBC  Recent Labs Lab 12/05/16 0824  WBC 6.6  HGB 11.1*  HCT 32.6*  PLT 227   ------------------------------------------------------------------------------------------------------------------  Chemistries   Recent Labs Lab 12/05/16 0824  NA 140  K 3.7  CL 100*  CO2 37*  GLUCOSE 101*  BUN 18  CREATININE 0.81  CALCIUM 8.8*  AST 17  ALT 9*  ALKPHOS 40  BILITOT 0.7   ------------------------------------------------------------------------------------------------------------------  Cardiac Enzymes  Recent Labs Lab 12/05/16 0824  TROPONINI <0.03   ------------------------------------------------------------------------------------------------------------------  RADIOLOGY:  Dg Chest Portable 1 View  Result Date: 12/05/2016 CLINICAL DATA:  Shortness of breath and cough EXAM: PORTABLE CHEST 1 VIEW COMPARISON:  Chest radiograph October 09, 2016; August 14, 2016 FINDINGS: There is fairly diffuse interstitial prominence. The appearance is suggestive of a  degree of interstitial edema superimposed on chronic fibrotic type change. There is no airspace consolidation. There is cardiomegaly with slight pulmonary venous hypertension. Patient is status post coronary artery bypass grafting. There is atherosclerotic calcification in the aorta. No bone lesions. IMPRESSION: Findings felt to represent a degree of congestive heart failure superimposed on chronic fibrotic type change. There is aortic atherosclerosis. Patient is status post coronary artery bypass grafting. Electronically Signed   By: Lowella Grip III M.D.   On: 12/05/2016 08:37     IMPRESSION AND PLAN:   * Acute on chronic systolic chf - IV Lasix, Beta blockers - Input and Output - Counseled to limit fluids and Salt - Monitor Bun/Cr and Potassium - Echo from 06/2016 reviewed. EF- 45% -Cardiology follow  up after discharge Kcl BID  * COPD exacerbation -IV steroids - Scheduled Nebulizers - Inhalers -Wean O2 as tolerated - Consult pulmonary if no improvement  * HTN Continue home meds  * Anxiety Xanax BID PRN  * DVT prophylaxis Lovenox  All the records are reviewed and case discussed with ED provider. Management plans discussed with the patient, family and they are in agreement.  CODE STATUS: DNR  TOTAL TIME TAKING CARE OF THIS PATIENT: 40 minutes.   Hillary Bow R M.D on 12/05/2016 at 11:06 AM  Between 7am to 6pm - Pager - [phone]  After 6pm go to [email or web address] - password [email or web address] [facility]  Office  [phone]  CC: Primary care physician; Volanda Napoleon, MD  Note: This dictation was prepared with Dragon dictation along with smaller phrase technology. Any transcriptional errors that result from this process are unintentional.

## 2016-12-06 LAB — BASIC METABOLIC PANEL
Anion gap: 6 (ref 5–15)
BUN: 21 mg/dL — ABNORMAL HIGH (ref 6–20)
CALCIUM: 9.1 mg/dL (ref 8.9–10.3)
CO2: 40 mmol/L — AB (ref 22–32)
CREATININE: 0.84 mg/dL (ref 0.44–1.00)
Chloride: 96 mmol/L — ABNORMAL LOW (ref 101–111)
GFR calc non Af Amer: >60 mL/min (ref >60)
Glucose, Bld: 144 mg/dL — ABNORMAL HIGH (ref 65–99)
Potassium: 4 mmol/L (ref 3.5–5.1)
Sodium: 142 mmol/L (ref 135–145)

## 2016-12-06 MED ORDER — ENSURE ENLIVE PO LIQD
237.0000 mL | Freq: Two times a day (BID) | ORAL | Status: DC
  Administered 2016-12-06 – 2016-12-08 (×5): 237 mL via ORAL

## 2016-12-06 MED ORDER — LISINOPRIL 10 MG PO TABS
10.0000 mg | ORAL_TABLET | Freq: Every day | ORAL | Status: DC
  Administered 2016-12-06 – 2016-12-07 (×2): 10 mg via ORAL
  Filled 2016-12-06 (×2): qty 1

## 2016-12-06 NOTE — Plan of Care (Signed)
Problem: Education: Goal: Ability to verbalize understanding of medication therapies will improve Outcome: Progressing Patient stated she stopped taking her lasix at home. Discussed with patient importance of following medication regimen. Patient verbalized understanding.   Problem: Health Behavior/Discharge Planning: Goal: Ability to manage health-related needs will improve for discharge Outcome: Progressing Educated patient on daily weights. Used teach back method. Patient was able to teach back information.

## 2016-12-06 NOTE — Progress Notes (Signed)
[facility] [place] NAME: Debra [name]    MR#:  [id number]  DATE OF BIRTH:  1933-04-19  SUBJECTIVE:  CHIEF COMPLAINT:   Chief Complaint  Patient presents with  . Shortness of Breath     Non compliant with lasix and drinking too much water at home.   Came with SOB.   Feels little better now.  REVIEW OF SYSTEMS:  CONSTITUTIONAL: No fever, fatigue or weakness.  EYES: No blurred or double vision.  EARS, NOSE, AND THROAT: No tinnitus or ear pain.  RESPIRATORY: No cough, shortness of breath, wheezing or hemoptysis.  CARDIOVASCULAR: No chest pain, orthopnea, edema.  GASTROINTESTINAL: No nausea, vomiting, diarrhea or abdominal pain.  GENITOURINARY: No dysuria, hematuria.  ENDOCRINE: No polyuria, nocturia,  HEMATOLOGY: No anemia, easy bruising or bleeding SKIN: No rash or lesion. MUSCULOSKELETAL: No joint pain or arthritis.   NEUROLOGIC: No tingling, numbness, weakness.  PSYCHIATRY: No anxiety or depression.   ROS  DRUG ALLERGIES:   Allergies  Allergen Reactions  . Codeine Itching and Rash    VITALS:  Blood pressure (!) 128/55, pulse 84, temperature 97.9 F (36.6 C), temperature source Oral, resp. rate (!) 22, height '5\' 1"'$  (1.549 m), weight 73.2 kg (161 lb 6.4 oz), SpO2 92 %.  PHYSICAL EXAMINATION:  GENERAL:  83 y.o.-year-old patient lying in the bed with no acute distress.  EYES: Pupils equal, round, reactive to light and accommodation. No scleral icterus. Extraocular muscles intact.  HEENT: Head atraumatic, normocephalic. Oropharynx and nasopharynx clear.  NECK:  Supple, no jugular venous distention. No thyroid enlargement, no tenderness.  LUNGS: Normal breath sounds bilaterally, no wheezing, bilateral crepitation. No use of accessory muscles of respiration.  CARDIOVASCULAR: S1, S2 normal. No murmurs, rubs, or gallops.  ABDOMEN: Soft, nontender, nondistended. Bowel sounds present. No organomegaly or mass.  EXTREMITIES: some  pedal edema,no cyanosis, or clubbing.  NEUROLOGIC: Cranial nerves II through XII are intact. Muscle strength 5/5 in all extremities. Sensation intact. Gait not checked.  PSYCHIATRIC: The patient is alert and oriented x 3.  SKIN: No obvious rash, lesion, or ulcer.   Physical Exam LABORATORY PANEL:   CBC  Recent Labs Lab 12/05/16 0824  WBC 6.6  HGB 11.1*  HCT 32.6*  PLT 227   ------------------------------------------------------------------------------------------------------------------  Chemistries   Recent Labs Lab 12/05/16 0824 12/06/16 0358  NA 140 142  K 3.7 4.0  CL 100* 96*  CO2 37* 40*  GLUCOSE 101* 144*  BUN 18 21*  CREATININE 0.81 0.84  CALCIUM 8.8* 9.1  AST 17  --   ALT 9*  --   ALKPHOS 40  --   BILITOT 0.7  --    ------------------------------------------------------------------------------------------------------------------  Cardiac Enzymes  Recent Labs Lab 12/05/16 0824  TROPONINI <0.03   ------------------------------------------------------------------------------------------------------------------  RADIOLOGY:  Dg Chest Portable 1 View  Result Date: 12/05/2016 CLINICAL DATA:  Shortness of breath and cough EXAM: PORTABLE CHEST 1 VIEW COMPARISON:  Chest radiograph October 09, 2016; August 14, 2016 FINDINGS: There is fairly diffuse interstitial prominence. The appearance is suggestive of a degree of interstitial edema superimposed on chronic fibrotic type change. There is no airspace consolidation. There is cardiomegaly with slight pulmonary venous hypertension. Patient is status post coronary artery bypass grafting. There is atherosclerotic calcification in the aorta. No bone lesions. IMPRESSION: Findings felt to represent a degree of congestive heart failure superimposed on chronic fibrotic type change. There is aortic atherosclerosis. Patient is status post coronary artery bypass grafting. Electronically Signed  By: Lowella Grip III M.D.    On: 12/05/2016 08:37    ASSESSMENT AND PLAN:   Active Problems:   CHF (congestive heart failure) ([facility])  * Acute on chronic systolic chf - IV Lasix, Beta blockers - Input and Output monitoring. - Counseled to limit fluids and Salt - Monitor Bun/Cr and Potassium - Echo from 06/2016 reviewed. EF- 45% -Cardiology follow up after discharge Kcl BID  * COPD exacerbation -IV steroids - Scheduled Nebulizers - Inhalers -Wean O2 as tolerated  * HTN Continue home meds  * Anxiety Xanax BID PRN  * DVT prophylaxis Lovenox    All the records are reviewed and case discussed with Care Management/Social Workerr. Management plans discussed with the patient, family and they are in agreement.  CODE STATUS: DNR  TOTAL TIME TAKING CARE OF THIS PATIENT: 35 minutes.   Pt's son was present in room during my visit.  POSSIBLE D/C IN 1-2 DAYS, DEPENDING ON CLINICAL CONDITION.   [name] M.D on 12/06/2016   Between 7am to 6pm - Pager - [phone]  After 6pm go to [email or web address] - password [email or web address] [facility]  Office  [phone]  CC: Primary care physician; Volanda Napoleon, MD  Note: This dictation was prepared with Dragon dictation along with smaller phrase technology. Any transcriptional errors that result from this process are unintentional.

## 2016-12-06 NOTE — Plan of Care (Signed)
Problem: Cardiac: Goal: Ability to achieve and maintain adequate cardiopulmonary perfusion will improve Outcome: Not Progressing Dyspnea with minimal exertion to bedside commode.  Oxygen continues at [facility].

## 2016-12-06 NOTE — Progress Notes (Signed)
Nutrition Brief Note  Patient identified on the Malnutrition Screening Tool (MST) Report  Wt Readings from Last 15 Encounters:  12/06/16 161 lb 6.4 oz (73.2 kg)  10/10/16 171 lb (77.6 kg)  06/28/16 162 lb (73.5 kg)  06/22/16 162 lb (73.5 kg)  06/11/16 167 lb 12.3 oz (76.1 kg)  09/14/15 169 lb 11.2 oz (77 kg)  09/01/15 178 lb 9.6 oz (81 kg)  07/13/15 179 lb (81.2 kg)    Body mass index is 30.5 kg/m. Patient meets criteria for obese class I based on current BMI.   Current diet order is heart healthy, patient is consuming approximately 100% of meals at this time. Labs and medications reviewed.   No nutrition interventions warranted at this time. If nutrition issues arise, please consult RD.   Satira Anis. Tyneisha Hegeman, MS, RD LDN Inpatient Clinical Dietitian Pager [phone]

## 2016-12-06 NOTE — Discharge Instructions (Signed)
Heart Failure Clinic appointment on December 17, 2016 at 1:00pm with Darylene Price, [place]. Please call [phone] to reschedule.

## 2016-12-07 ENCOUNTER — Inpatient Hospital Stay: Payer: Medicare Other

## 2016-12-07 LAB — BASIC METABOLIC PANEL
Anion gap: 5 (ref 5–15)
BUN: 30 mg/dL — AB (ref 6–20)
CO2: 41 mmol/L — ABNORMAL HIGH (ref 22–32)
CREATININE: 1.03 mg/dL — AB (ref 0.44–1.00)
Calcium: 9.3 mg/dL (ref 8.9–10.3)
Chloride: 93 mmol/L — ABNORMAL LOW (ref 101–111)
GFR calc Af Amer: 57 mL/min — ABNORMAL LOW (ref >60)
GFR calc non Af Amer: 49 mL/min — ABNORMAL LOW (ref >60)
GLUCOSE: 141 mg/dL — AB (ref 65–99)
Potassium: 4.7 mmol/L (ref 3.5–5.1)
SODIUM: 139 mmol/L (ref 135–145)

## 2016-12-07 MED ORDER — LEVOFLOXACIN 500 MG PO TABS
500.0000 mg | ORAL_TABLET | Freq: Once | ORAL | Status: AC
  Administered 2016-12-07: 500 mg via ORAL
  Filled 2016-12-07: qty 1

## 2016-12-07 MED ORDER — LEVOFLOXACIN 250 MG PO TABS
250.0000 mg | ORAL_TABLET | Freq: Every day | ORAL | Status: DC
  Administered 2016-12-08: 250 mg via ORAL
  Filled 2016-12-07: qty 1

## 2016-12-07 MED ORDER — FUROSEMIDE 10 MG/ML IJ SOLN
20.0000 mg | Freq: Two times a day (BID) | INTRAMUSCULAR | Status: DC
  Administered 2016-12-07: 20 mg via INTRAVENOUS
  Filled 2016-12-07: qty 2

## 2016-12-07 MED ORDER — IPRATROPIUM-ALBUTEROL 0.5-2.5 (3) MG/3ML IN SOLN
3.0000 mL | Freq: Four times a day (QID) | RESPIRATORY_TRACT | Status: DC
  Administered 2016-12-08 (×2): 3 mL via RESPIRATORY_TRACT
  Filled 2016-12-07 (×2): qty 3

## 2016-12-07 MED ORDER — METHYLPREDNISOLONE SODIUM SUCC 40 MG IJ SOLR
40.0000 mg | Freq: Two times a day (BID) | INTRAMUSCULAR | Status: DC
  Administered 2016-12-07 – 2016-12-08 (×2): 40 mg via INTRAVENOUS
  Filled 2016-12-07 (×2): qty 1

## 2016-12-07 NOTE — Progress Notes (Signed)
[facility] at [place] NAME: Debra [name]    MR#:  [id number]  DATE OF BIRTH:  1933/03/07  SUBJECTIVE:   Patient with thick yellow sputum today Breathing a bit better  REVIEW OF SYSTEMS:    Review of Systems  Constitutional: Negative.  Negative for chills, fever and malaise/fatigue.  HENT: Negative.  Negative for ear discharge, ear pain, hearing loss, nosebleeds and sore throat.   Eyes: Negative.  Negative for blurred vision and pain.  Respiratory: Positive for cough, sputum production and shortness of breath. Negative for hemoptysis and wheezing.   Cardiovascular: Negative.  Negative for chest pain, palpitations and leg swelling.  Gastrointestinal: Negative.  Negative for abdominal pain, blood in stool, diarrhea, nausea and vomiting.  Genitourinary: Negative.  Negative for dysuria.  Musculoskeletal: Negative.  Negative for back pain.  Skin: Negative.   Neurological: Negative for dizziness, tremors, speech change, focal weakness, seizures and headaches.  Endo/Heme/Allergies: Negative.  Does not bruise/bleed easily.  Psychiatric/Behavioral: Negative.  Negative for depression, hallucinations and suicidal ideas.    Tolerating Diet:yes      DRUG ALLERGIES:   Allergies  Allergen Reactions  . Codeine Itching and Rash    VITALS:  Blood pressure (!) 165/69, pulse 71, temperature 97.6 F (36.4 C), temperature source Oral, resp. rate 18, height '5\' 1"'$  (1.549 m), weight 72.1 kg (159 lb), SpO2 91 %.  PHYSICAL EXAMINATION:   Physical Exam  Constitutional: She is oriented to person, place, and time and well-developed, well-nourished, and in no distress. No distress.  HENT:  Head: Normocephalic.  Eyes: No scleral icterus.  Neck: Normal range of motion. Neck supple. No JVD present. No tracheal deviation present.  Cardiovascular: Normal rate and regular rhythm.  Exam reveals no gallop and no friction rub.   Murmur heard. Pulmonary/Chest:  Effort normal. No respiratory distress. She has wheezes. She has rales. She exhibits no tenderness.  Abdominal: Soft. Bowel sounds are normal. She exhibits no distension and no mass. There is no tenderness. There is no rebound and no guarding.  Musculoskeletal: Normal range of motion. She exhibits no edema.  Neurological: She is alert and oriented to person, place, and time.  Skin: Skin is warm. No rash noted. No erythema.  Psychiatric: Affect and judgment normal.      LABORATORY PANEL:   CBC  Recent Labs Lab 12/05/16 0824  WBC 6.6  HGB 11.1*  HCT 32.6*  PLT 227   ------------------------------------------------------------------------------------------------------------------  Chemistries   Recent Labs Lab 12/05/16 0824  12/07/16 0726  NA 140  < > 139  K 3.7  < > 4.7  CL 100*  < > 93*  CO2 37*  < > 41*  GLUCOSE 101*  < > 141*  BUN 18  < > 30*  CREATININE 0.81  < > 1.03*  CALCIUM 8.8*  < > 9.3  AST 17  --   --   ALT 9*  --   --   ALKPHOS 40  --   --   BILITOT 0.7  --   --   < > = values in this interval not displayed. ------------------------------------------------------------------------------------------------------------------  Cardiac Enzymes  Recent Labs Lab 12/05/16 0824  TROPONINI <0.03   ------------------------------------------------------------------------------------------------------------------  RADIOLOGY:  No results found.   ASSESSMENT AND PLAN:   83 y.o. female with a known history of Chronic systolic CHF with ejection fraction 45%, COPD, anxiety, hypertension presents to the emergency room complaining of worsening shortness of breath.   1. Acute on  chronic systolic heart failure: Symptoms improving Decrease IV LAsix and plan to switch to PO in am Follow creatinine and output Continue Lisinopril and Metoprolol  2. COPD acute exacerbation with chronic respiratory failure on @ L of O2 Decrease IV steroids Continue Nebs and   Inhalers  3. CAP: Add Levaquin   4. Essential HTN Continue Lisinopril , Hydralazineand Metoprolol 5. Depression : Continue Prozac    Management plans discussed with the patient and she is in agreement.  CODE STATUS: dbr  TOTAL TIME TAKING CARE OF THIS PATIENT: 27 minutes.     POSSIBLE D/C tomorrow , DEPENDING ON CLINICAL CONDITION.   Orli Degrave M.D on 12/07/2016 at 10:53 AM  Between 7am to 6pm - Pager - [phone] After 6pm go to [email or web address] - password [email or web address] [facility]  Office  [phone]  CC: Primary care physician; Volanda Napoleon, MD  Note: This dictation was prepared with Dragon dictation along with smaller phrase technology. Any transcriptional errors that result from this process are unintentional.

## 2016-12-07 NOTE — Care Management Important Message (Signed)
Important Message  Patient Details  Name: Debra [name] MRN: [id number] Date of Birth: Jun 03, 1933   Medicare Important Message Given:  Yes  Signed copy given    Katrina Stack, RN 12/07/2016, 8:54 AM

## 2016-12-07 NOTE — Evaluation (Signed)
Physical Therapy Evaluation Patient Details Name: Debra [name] MRN: [id number] DOB: [birth year] Today's Date: 12/07/2016   History of Present Illness  Debra [name]  is a 83 y.o. female with a known history of Chronic systolic CHF with ejection fraction 45%, COPD, anxiety, hypertension presents to the emergency room complaining of worsening shortness of breath since yesterday. She has also noticed orthopnea. No lower extremity edema. Has been wheezing and coughing clear frothy sputum. Complains of bilateral lower chest pain on coughing. She has not been taking her Lasix as she has to wake up frequently to go to the bathroom. Drinks plenty of fluids. Here in the emergency room patient has been given steroids and nebulizers. Her saturations are 84% on 2-3 L oxygen. She does wear 2-3 L oxygen at home. X-ray shows pulmonary edema. Patient admitted for COPD and CHF exacerbation  Clinical Impression  Pt admitted with above diagnosis. Pt currently with functional limitations due to the deficits listed below (see PT Problem List). Pt is modified independent for bed mobility and CGA only for transfers and ambulation. She demonstrates safety with ambulation using rollator however on 2L/min O2 SaO2 drops to 85% during ambulation. With a standing rest break initially she requires 30 seconds to recover to 90%. At end of ambulation when returning to room she requires a full 90 seconds for SaO2 to recover to greater than 90%. Gait is slow but close to patient's baseline. She describes limited mobility at home, and reports that she mostly remains in her house. She would benefit from [facility] PT to work on strength, balance, and cardiopulmonary endurance to improve function and prevent readmission. Pt will benefit from skilled PT services to address deficits in strength, balance, and mobility in order to return to full function at home.      Follow Up Recommendations Home health PT    Equipment Recommendations  None  recommended by PT;Other (comment) (Continue to use rollator)    Recommendations for Other Services       Precautions / Restrictions Precautions Precautions: Fall Restrictions Weight Bearing Restrictions: No      Mobility  Bed Mobility Overal bed mobility: Modified Independent             General bed mobility comments: HOB mildly elevated and bed rails utilized  Transfers Overall transfer level: Needs assistance Equipment used: Rolling walker (2 wheeled) Transfers: Sit to/from Stand Sit to Stand: Min guard         General transfer comment: Pt demonstrates fair speed/sequencing with transfers. She is stable in standing with UE support on rolling walker  Ambulation/Gait Ambulation/Gait assistance: Min guard Ambulation Distance (Feet): 80 Feet Assistive device: Rolling walker (2 wheeled) Gait Pattern/deviations: Decreased step length - right;Decreased step length - left Gait velocity: Decreased Gait velocity interpretation: <1.8 ft/sec, indicative of risk for recurrent falls General Gait Details: Pt able to ambulate limited distance in hallway outside of room. She is reasonably steady with rolling walker. On 2L/min O2 SaO2 drops to 85% during ambulation. With a standing rest break initially she requires 30 seconds to recover to 90%. At end of ambulation when returning to room she requires a full 90 seconds for SaO2 to recover to greater than 90%. Gait is slow but close to patient's baseline  Stairs            Wheelchair Mobility    Modified Rankin (Stroke Patients Only)       Balance Overall balance assessment: Needs assistance Sitting-balance support: No upper extremity supported Sitting  balance-Leahy Scale: Good     Standing balance support: No upper extremity supported Standing balance-Leahy Scale: Fair Standing balance comment: Pt able to maintain feet apart balance. Once she places her feet together she is only able to remain upright for approximately  2-3 seconds before loss of balance                             Pertinent Vitals/Pain Pain Assessment: No/denies pain    Home Living Family/patient expects to be discharged to:: Private residence Living Arrangements: Other relatives (granddaughter) Available Help at Discharge: Family;Available 24 hours/day Type of Home: Mobile home Home Access: Ramped entrance     Home Layout: One level Home Equipment: [place] - 4 wheels;Bedside commode;Shower seat (uses BSC over top of her commode, home O2)      Prior Function Level of Independence: Needs assistance   Gait / Transfers Assistance Needed: Pt modified independent with household distances with rollator. Mostly remains at home  ADL's / Homemaking Assistance Needed: Pt daughter comes 4x/week to assist with bathing and homemaking.   Comments: Pt denies any falls in the past 6 months.     Hand Dominance   Dominant Hand: Right    Extremity/Trunk Assessment   Upper Extremity Assessment Upper Extremity Assessment: Overall WFL for tasks assessed    Lower Extremity Assessment Lower Extremity Assessment: Overall WFL for tasks assessed       Communication   Communication: No difficulties  Cognition Arousal/Alertness: Awake/alert Behavior During Therapy: WFL for tasks assessed/performed Overall Cognitive Status: Within Functional Limits for tasks assessed                                        General Comments      Exercises     Assessment/Plan    PT Assessment Patient needs continued PT services  PT Problem List Decreased strength;Decreased activity tolerance;Decreased balance;Decreased mobility;Decreased knowledge of use of DME;Cardiopulmonary status limiting activity       PT Treatment Interventions DME instruction;Gait training;Functional mobility training;Therapeutic activities;Therapeutic exercise;Balance training;Neuromuscular re-education;Patient/family education    PT Goals (Current  goals can be found in the Care Plan section)  Acute Rehab PT Goals Patient Stated Goal: Return home at prior function PT Goal Formulation: With patient Time For Goal Achievement: 12/21/16 Potential to Achieve Goals: Good    Frequency Min 2X/week   Barriers to discharge        Co-evaluation               End of Session Equipment Utilized During Treatment: Gait belt Activity Tolerance: Patient tolerated treatment well Patient left: in bed;with call bell/phone within reach;with bed alarm set Nurse Communication: Mobility status PT Visit Diagnosis: Unsteadiness on feet (R26.81);Difficulty in walking, not elsewhere classified (R26.2)    Time: 2094-7096 PT Time Calculation (min) (ACUTE ONLY): 19 min   Charges:   PT Evaluation $PT Eval Low Complexity: 1 Procedure     PT G Codes:       Lyndel Safe Dema Timmons PT, DPT    Takeyah Wieman 12/07/2016, 10:48 AM

## 2016-12-07 NOTE — Care Management (Signed)
[facility] will accept the patient for home health

## 2016-12-07 NOTE — Progress Notes (Signed)
ANTIBIOTIC CONSULT NOTE - INITIAL  Pharmacy Consult for Levofloxacin Indication: CAP/COPD exacerbation  Allergies  Allergen Reactions  . Codeine Itching and Rash    Patient Measurements: Height: '5\' 1"'$  (154.9 cm) Weight: 159 lb (72.1 kg) IBW/kg (Calculated) : 47.8 Adjusted Body Weight:   Vital Signs: Temp: 97.6 F (36.4 C) (03/30 0800) Temp Source: Oral (03/30 0800) BP: 165/69 (03/30 0800) Pulse Rate: 71 (03/30 0800) Intake/Output from previous day: 03/29 0701 - 03/30 0700 In: 720 [P.O.:720] Out: 1750 [Urine:1750] Intake/Output from this shift: Total I/O In: 240 [P.O.:240] Out: -   Labs:  Recent Labs  12/05/16 0824 12/06/16 0358 12/07/16 0726  WBC 6.6  --   --   HGB 11.1*  --   --   PLT 227  --   --   CREATININE 0.81 0.84 1.03*   Estimated Creatinine Clearance: 38.2 mL/min (A) (by C-G formula based on SCr of 1.03 mg/dL (H)). No results for input(s): VANCOTROUGH, VANCOPEAK, VANCORANDOM, GENTTROUGH, GENTPEAK, GENTRANDOM, TOBRATROUGH, TOBRAPEAK, TOBRARND, AMIKACINPEAK, AMIKACINTROU, AMIKACIN in the last 72 hours.   Microbiology: No results found for this or any previous visit (from the past 720 hour(s)).  Medical History: Past Medical History:  Diagnosis Date  . Asthma   . COPD (chronic obstructive pulmonary disease) ([place])   . Myocardial infarction     Medications:  Prescriptions Prior to Admission  Medication Sig Dispense Refill Last Dose  . ALPRAZolam (XANAX) 0.5 MG tablet Take 1 tablet (0.5 mg total) by mouth 2 (two) times daily. (Patient taking differently: Take 0.5 mg by mouth 2 (two) times daily as needed. ) 15 tablet 0 12/05/2016 at 0800  . aspirin 81 MG chewable tablet Chew by mouth every morning.   12/04/2016 at 1000  . atorvastatin (LIPITOR) 10 MG tablet Take 10 mg by mouth every evening.   12/04/2016 at 1000  . benzonatate (TESSALON) 100 MG capsule Take by mouth 3 (three) times daily.   12/04/2016 at 1000  . Calcium Carbonate-Vitamin D (OYSTERCAL 500  + D PO) Take 1 tablet by mouth 2 (two) times daily.   12/04/2016 at 1000  . clopidogrel (PLAVIX) 75 MG tablet Take 75 mg by mouth every morning.    12/04/2016 at 1000  . fexofenadine (ALLEGRA) 180 MG tablet Take 180 mg by mouth daily.   12/04/2016 at 2000  . FLUoxetine (PROZAC) 20 MG capsule Take 1 capsule (20 mg total) by mouth daily. 30 capsule 3 12/04/2016 at 1000  . hydrALAZINE (APRESOLINE) 25 MG tablet Take 1 tablet (25 mg total) by mouth every 8 (eight) hours. 90 tablet 0 12/04/2016 at 1800  . latanoprost (XALATAN) 0.005 % ophthalmic solution Place 1 drop into both eyes at bedtime.   12/04/2016 at 2000  . losartan (COZAAR) 25 MG tablet Take 25 mg by mouth daily.   12/04/2016 at 1000  . metoprolol (LOPRESSOR) 25 MG tablet Take 1 tablet (25 mg total) by mouth 2 (two) times daily. 60 tablet 0 12/04/2016 at 1800  . pantoprazole (PROTONIX) 40 MG tablet Take 40 mg by mouth every morning.    12/04/2016 at 1000  . tiotropium (SPIRIVA HANDIHALER) 18 MCG inhalation capsule Place 1 capsule (18 mcg total) into inhaler and inhale every morning. 30 capsule 12 12/04/2016 at 1000  . traZODone (DESYREL) 50 MG tablet Take 50 mg by mouth at bedtime.    12/04/2016 at 2000  . acetaminophen (TYLENOL) 325 MG tablet Take 2 tablets (650 mg total) by mouth every 6 (six) hours as needed for mild  pain (or Fever >/= 101).   prn at prn  . albuterol (PROVENTIL HFA;VENTOLIN HFA) 108 (90 Base) MCG/ACT inhaler Inhale 2 puffs into the lungs every 6 (six) hours as needed for wheezing or shortness of breath.   prn at prn  . albuterol (PROVENTIL) (2.5 MG/3ML) 0.083% nebulizer solution Take 2.5 mg by nebulization 4 (four) times daily as needed for wheezing or shortness of breath.   prn at prn  . amoxicillin-clavulanate (AUGMENTIN) 875-125 MG tablet Take 1 tablet by mouth 2 (two) times daily. (Patient not taking: Reported on 12/05/2016) 10 tablet 0 Not Taking at Unknown time  . bisacodyl (DULCOLAX) 10 MG suppository Place 0.5 suppositories (5 mg  total) rectally daily as needed for moderate constipation. 12 suppository 0 prn at prn   . diclofenac sodium (VOLTAREN) 1 % GEL Apply 2 g topically 2 (two) times daily as needed (for pain).   prn at prn  . docusate sodium (COLACE) 50 MG capsule Take 50 mg by mouth daily as needed.    prn at prn  . furosemide (LASIX) 40 MG tablet Take 1 tablet (40 mg total) by mouth daily. (Patient not taking: Reported on 12/05/2016) 30 tablet 0 Not Taking at Unknown time  . guaiFENesin-dextromethorphan (ROBITUSSIN DM) 100-10 MG/5ML syrup Take 10 mLs by mouth every 6 (six) hours as needed for cough. 118 mL 0 prn at prn  . HYDROcodone-acetaminophen (NORCO/VICODIN) 5-325 MG tablet Take 1 tablet by mouth every 6 (six) hours as needed for moderate pain. 15 tablet 0 prn at prn  . ipratropium-albuterol (DUONEB) 0.5-2.5 (3) MG/3ML SOLN Take 3 mLs by nebulization every 4 (four) hours as needed. 360 mL 0 prn at prn  . mometasone-formoterol (DULERA) 100-5 MCG/ACT AERO Inhale 2 puffs into the lungs 2 (two) times daily. (Patient not taking: Reported on 12/05/2016) 1 Inhaler 1 Not Taking at 1000  . predniSONE (STERAPRED UNI-PAK 21 TAB) 10 MG (21) TBPK tablet Take 1 tablet (10 mg total) by mouth daily. Take 6 tablets by mouth for 1 day followed by  5 tablets by mouth for 1 day followed by  4 tablets by mouth for 1 day followed by  3 tablets by mouth for 1 day followed by  2 tablets by mouth for 1 day followed by  1 tablet by mouth for a day and stop (Patient not taking: Reported on 12/05/2016) 21 tablet 0 Not Taking at Unknown time  . saccharomyces boulardii (FLORASTOR) 250 MG capsule Take 1 capsule (250 mg total) by mouth 2 (two) times daily. (Patient not taking: Reported on 12/05/2016) 30 capsule 0 Not Taking at Unknown time  . tiZANidine (ZANAFLEX) 2 MG tablet Take 2 mg by mouth 3 (three) times daily as needed for muscle spasms.    prn at prn   Scheduled:  . aspirin  81 mg Oral Daily  . atorvastatin  10 mg Oral QPM  .  clopidogrel  75 mg Oral BH-q7a  . enoxaparin (LOVENOX) injection  40 mg Subcutaneous Q24H  . feeding supplement (ENSURE ENLIVE)  237 mL Oral BID BM  . FLUoxetine  20 mg Oral Daily  . furosemide  20 mg Intravenous BID  . hydrALAZINE  25 mg Oral Q8H  . ipratropium-albuterol  3 mL Nebulization Q4H  . latanoprost  1 drop Both Eyes QHS  . [START ON 12/08/2016] levofloxacin  250 mg Oral Daily  . levofloxacin  500 mg Oral Once  . lisinopril  10 mg Oral Daily  . loratadine  10 mg Oral  Daily  . methylPREDNISolone (SOLU-MEDROL) injection  40 mg Intravenous Q12H  . metoprolol tartrate  25 mg Oral BID  . mometasone-formoterol  2 puff Inhalation BID  . pantoprazole  40 mg Oral BH-q7a  . potassium chloride  20 mEq Oral BID  . sodium chloride flush  3 mL Intravenous Q12H  . sodium chloride flush  3 mL Intravenous Q12H  . tiotropium  18 mcg Inhalation q morning - 10a  . traZODone  50 mg Oral QHS   Assessment: Pharmacy consulted to dose and monitor levofloxacin in this 83 year old woman.  Goal of Therapy:    Plan:  Will give levofloxacin 500 mg PO x 1 then 250 mg PO q24 hours thereafter.   Taiyana Kissler D 12/07/2016,11:18 AM

## 2016-12-08 LAB — BASIC METABOLIC PANEL
Anion gap: 6 (ref 5–15)
BUN: 37 mg/dL — AB (ref 6–20)
CALCIUM: 9.6 mg/dL (ref 8.9–10.3)
CO2: 39 mmol/L — ABNORMAL HIGH (ref 22–32)
CREATININE: 1.25 mg/dL — AB (ref 0.44–1.00)
Chloride: 92 mmol/L — ABNORMAL LOW (ref 101–111)
GFR calc Af Amer: 45 mL/min — ABNORMAL LOW (ref >60)
GFR, EST NON AFRICAN AMERICAN: 39 mL/min — AB (ref >60)
GLUCOSE: 161 mg/dL — AB (ref 65–99)
Potassium: 5.2 mmol/L — ABNORMAL HIGH (ref 3.5–5.1)
SODIUM: 137 mmol/L (ref 135–145)

## 2016-12-08 MED ORDER — PREDNISONE 50 MG PO TABS: 50.0000 mg | ORAL_TABLET | Freq: Every day | ORAL | 0 refills | Status: AC

## 2016-12-08 MED ORDER — FUROSEMIDE 40 MG PO TABS: 40.0000 mg | ORAL_TABLET | Freq: Every day | ORAL | 0 refills | Status: DC

## 2016-12-08 MED ORDER — INSULIN REGULAR HUMAN 100 UNIT/ML IJ SOLN
10.0000 [IU] | Freq: Once | INTRAMUSCULAR | Status: AC
  Administered 2016-12-08: 10 [IU] via INTRAVENOUS
  Filled 2016-12-08: qty 0.1

## 2016-12-08 MED ORDER — LEVOFLOXACIN 250 MG PO TABS: 250.0000 mg | ORAL_TABLET | Freq: Every day | ORAL | 0 refills | Status: DC

## 2016-12-08 MED ORDER — LEVOFLOXACIN 250 MG PO TABS: 250.0000 mg | ORAL_TABLET | Freq: Every day | ORAL | 0 refills | Status: AC

## 2016-12-08 MED ORDER — ENSURE ENLIVE PO LIQD: 237.0000 mL | Freq: Two times a day (BID) | ORAL | 12 refills | Status: AC

## 2016-12-08 MED ORDER — PREDNISONE 50 MG PO TABS: 50.0000 mg | ORAL_TABLET | Freq: Every day | ORAL | 0 refills | Status: DC

## 2016-12-08 MED ORDER — DEXTROSE 50 % IV SOLN
25.0000 mL | Freq: Once | INTRAVENOUS | Status: AC
  Administered 2016-12-08: 25 mL via INTRAVENOUS

## 2016-12-08 MED ORDER — DEXTROSE 50 % IV SOLN
INTRAVENOUS | Status: AC
  Filled 2016-12-08: qty 50

## 2016-12-08 MED ORDER — ALPRAZOLAM 0.5 MG PO TABS: 0.5000 mg | ORAL_TABLET | Freq: Two times a day (BID) | ORAL | 0 refills | Status: DC | PRN

## 2016-12-08 NOTE — Progress Notes (Signed)
nsr. 2 L of oxygen. Pt reports no pain. Takes meds ok. A & O. Iv and tele removed. Prescriptions given to pt. Discharge instructions given to pt. Pt has no further concerns at this time.

## 2016-12-08 NOTE — Care Management Note (Signed)
Case Management Note  Patient Details  Name: Debra [name] MRN: [id number] Date of Birth: 04/16/1933  Subjective/Objective:          A referral for HH-PT and RN was called to [place] [facility] [place]. [name] has used [facility] home health services in the past.           Action/Plan:   Expected Discharge Date:  12/08/16               Expected Discharge Plan:     In-House Referral:     Discharge planning Services     Post Acute Care Choice:    Choice offered to:     DME Arranged:    DME Agency:     HH Arranged:    HH Agency:     Status of Service:     If discussed at [employer], dates discussed:    Additional Comments:  Laurance Heide A, RN 12/08/2016, 1:50 PM

## 2016-12-08 NOTE — Discharge Summary (Signed)
[facility] NAME: Debra [name]    MR#:  [id number]  DATE OF BIRTH:  Jan 19, 1933  DATE OF ADMISSION:  12/05/2016 ADMITTING PHYSICIAN: Hillary Bow, MD  DATE OF DISCHARGE: 12/08/2016  PRIMARY CARE PHYSICIAN: Volanda Napoleon, MD    ADMISSION DIAGNOSIS:  Hypoxia [R09.02] Acute on chronic congestive heart failure, unspecified congestive heart failure type ([place]) [I50.9]  DISCHARGE DIAGNOSIS:  Active Problems:   CHF (congestive heart failure) ([place])   SECONDARY DIAGNOSIS:   Past Medical History:  Diagnosis Date  . Asthma   . COPD (chronic obstructive pulmonary disease) ([place])   . Myocardial infarction     HOSPITAL COURSE:    83 y.o.femalewith a known history of Chronic systolic CHF with ejection fraction 45%, COPD, anxiety, hypertension presents to the emergency room complaining of worsening shortness of breath.   1. Acute on chronic systolic heart failure: Due to increased creatinine she will restart PO Lasix on Tuesday. She will continue Metoprolol Referral was made for CHF clinica.  2. COPD acute exacerbation with chronic respiratory failure on 2 L of O2 She can continue prednisone at discharge. Her wheezing has improved  3. CAP: She will continue treatment with Levaquin   4. Essential HTN Continue Losartan, Hydralazine and Metoprolol 5. Depression : Continue Prozac   6. 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  DISCHARGE CONDITIONS AND DIET:  Stable Cardiac diet   CONSULTS OBTAINED:    DRUG ALLERGIES:   Allergies  Allergen Reactions  . Codeine Itching and Rash    DISCHARGE MEDICATIONS:   Current Discharge Medication List    START taking these medications   Details  feeding supplement, ENSURE ENLIVE, (ENSURE ENLIVE) LIQD Take 237 mLs by mouth 2 (two) times daily between  meals. Qty: 237 mL, Refills: 12    levofloxacin (LEVAQUIN) 250 MG tablet Take 1 tablet (250 mg total) by mouth daily. Qty: 3 tablet, Refills: 0    predniSONE (DELTASONE) 50 MG tablet Take 1 tablet (50 mg total) by mouth daily with breakfast. Qty: 4 tablet, Refills: 0      CONTINUE these medications which have CHANGED   Details  ALPRAZolam (XANAX) 0.5 MG tablet Take 1 tablet (0.5 mg total) by mouth 2 (two) times daily as needed. Qty: 10 tablet, Refills: 0    furosemide (LASIX) 40 MG tablet Take 1 tablet (40 mg total) by mouth daily. Start on TUesday 4/3 Qty: 30 tablet, Refills: 0      CONTINUE these medications which have NOT CHANGED   Details  aspirin 81 MG chewable tablet Chew by mouth every morning.    atorvastatin (LIPITOR) 10 MG tablet Take 10 mg by mouth every evening.    benzonatate (TESSALON) 100 MG capsule Take by mouth 3 (three) times daily.    Calcium Carbonate-Vitamin D (OYSTERCAL 500 + D PO) Take 1 tablet by mouth 2 (two) times daily.    clopidogrel (PLAVIX) 75 MG tablet Take 75 mg by mouth every morning.     fexofenadine (ALLEGRA) 180 MG tablet Take 180 mg by mouth daily.    FLUoxetine (PROZAC) 20 MG capsule Take 1 capsule (20 mg total) by mouth daily. Qty: 30 capsule, Refills: 3    hydrALAZINE (APRESOLINE) 25 MG tablet Take 1 tablet (25 mg total) by mouth every 8 (eight) hours. Qty: 90 tablet, Refills: 0    latanoprost (XALATAN) 0.005 % ophthalmic solution Place 1  drop into both eyes at bedtime.    losartan (COZAAR) 25 MG tablet Take 25 mg by mouth daily.    metoprolol (LOPRESSOR) 25 MG tablet Take 1 tablet (25 mg total) by mouth 2 (two) times daily. Qty: 60 tablet, Refills: 0    pantoprazole (PROTONIX) 40 MG tablet Take 40 mg by mouth every morning.     tiotropium (SPIRIVA HANDIHALER) 18 MCG inhalation capsule Place 1 capsule (18 mcg total) into inhaler and inhale every morning. Qty: 30 capsule, Refills: 12    traZODone (DESYREL) 50 MG tablet Take 50  mg by mouth at bedtime.     acetaminophen (TYLENOL) 325 MG tablet Take 2 tablets (650 mg total) by mouth every 6 (six) hours as needed for mild pain (or Fever >/= 101).    albuterol (PROVENTIL HFA;VENTOLIN HFA) 108 (90 Base) MCG/ACT inhaler Inhale 2 puffs into the lungs every 6 (six) hours as needed for wheezing or shortness of breath.    albuterol (PROVENTIL) (2.5 MG/3ML) 0.083% nebulizer solution Take 2.5 mg by nebulization 4 (four) times daily as needed for wheezing or shortness of breath.    bisacodyl (DULCOLAX) 10 MG suppository Place 0.5 suppositories (5 mg total) rectally daily as needed for moderate constipation. Qty: 12 suppository, Refills: 0    docusate sodium (COLACE) 50 MG capsule Take 50 mg by mouth daily as needed.     guaiFENesin-dextromethorphan (ROBITUSSIN DM) 100-10 MG/5ML syrup Take 10 mLs by mouth every 6 (six) hours as needed for cough. Qty: 118 mL, Refills: 0    HYDROcodone-acetaminophen (NORCO/VICODIN) 5-325 MG tablet Take 1 tablet by mouth every 6 (six) hours as needed for moderate pain. Qty: 15 tablet, Refills: 0    ipratropium-albuterol (DUONEB) 0.5-2.5 (3) MG/3ML SOLN Take 3 mLs by nebulization every 4 (four) hours as needed. Qty: 360 mL, Refills: 0    mometasone-formoterol (DULERA) 100-5 MCG/ACT AERO Inhale 2 puffs into the lungs 2 (two) times daily. Qty: 1 Inhaler, Refills: 1    saccharomyces boulardii (FLORASTOR) 250 MG capsule Take 1 capsule (250 mg total) by mouth 2 (two) times daily. Qty: 30 capsule, Refills: 0    tiZANidine (ZANAFLEX) 2 MG tablet Take 2 mg by mouth 3 (three) times daily as needed for muscle spasms.       STOP taking these medications     amoxicillin-clavulanate (AUGMENTIN) 875-125 MG tablet      diclofenac sodium (VOLTAREN) 1 % GEL      predniSONE (STERAPRED UNI-PAK 21 TAB) 10 MG (21) TBPK tablet           Today   CHIEF COMPLAINT:  Patient doing better this am   VITAL SIGNS:  Blood pressure (!) 122/48, pulse 64,  temperature 97.9 F (36.6 C), temperature source Oral, resp. rate 18, height '5\' 1"'$  (1.549 m), weight 71.8 kg (158 lb 6.4 oz), SpO2 92 %.   REVIEW OF SYSTEMS:  Review of Systems  Constitutional: Negative.  Negative for chills, fever and malaise/fatigue.  HENT: Negative.  Negative for ear discharge, ear pain, hearing loss, nosebleeds and sore throat.   Eyes: Negative.  Negative for blurred vision and pain.  Respiratory: Positive for cough. Negative for hemoptysis, shortness of breath and wheezing.   Cardiovascular: Negative.  Negative for chest pain, palpitations and leg swelling.  Gastrointestinal: Negative.  Negative for abdominal pain, blood in stool, diarrhea, nausea and vomiting.  Genitourinary: Negative.  Negative for dysuria.  Musculoskeletal: Negative.  Negative for back pain.  Skin: Negative.   Neurological: Negative for dizziness,  tremors, speech change, focal weakness, seizures and headaches.  Endo/Heme/Allergies: Negative.  Does not bruise/bleed easily.  Psychiatric/Behavioral: Negative.  Negative for depression, hallucinations and suicidal ideas.     PHYSICAL EXAMINATION:  GENERAL:  83 y.o.-year-old patient lying in the bed with no acute distress.  NECK:  Supple, no jugular venous distention. No thyroid enlargement, no tenderness.  LUNGS: inspiratory crackles no wheezing, rales,rhonchi  No use of accessory muscles of respiration.  CARDIOVASCULAR: S1, S2 normal. 2/6SEM no rubs, or gallops.  ABDOMEN: Soft, non-tender, non-distended. Bowel sounds present. No organomegaly or mass.  EXTREMITIES: No pedal edema, cyanosis, or clubbing.  PSYCHIATRIC: The patient is alert and oriented x 3.  SKIN: No obvious rash, lesion, or ulcer.   DATA REVIEW:   CBC  Recent Labs Lab 12/05/16 0824  WBC 6.6  HGB 11.1*  HCT 32.6*  PLT 227    Chemistries   Recent Labs Lab 12/05/16 0824  12/08/16 0437  NA 140  < > 137  K 3.7  < > 5.2*  CL 100*  < > 92*  CO2 37*  < > 39*  GLUCOSE  101*  < > 161*  BUN 18  < > 37*  CREATININE 0.81  < > 1.25*  CALCIUM 8.8*  < > 9.6  AST 17  --   --   ALT 9*  --   --   ALKPHOS 40  --   --   BILITOT 0.7  --   --   < > = values in this interval not displayed.  Cardiac Enzymes  Recent Labs Lab 12/05/16 0824  TROPONINI <0.03    Microbiology Results  '@[id number]'$ @  RADIOLOGY:  Dg Chest 1 View  Result Date: 12/07/2016 CLINICAL DATA:  Shortness of breath, cough, congestive heart failure EXAM: CHEST 1 VIEW COMPARISON:  12/05/2016 FINDINGS: Cardiomegaly again noted. Status post CABG. Again noted chronic interstitial prominence and peripheral fibrotic changes. No definite superimposed infiltrate. No convincing pulmonary edema. Linear atelectasis noted right upper lobe perihilar. IMPRESSION: Stable chronic reticulonodular interstitial prominence and peripheral fibrotic changes. No definite superimposed infiltrate. No convincing pulmonary edema. Electronically Signed   By: Lahoma Crocker M.D.   On: 12/07/2016 11:18      Current Discharge Medication List    START taking these medications   Details  feeding supplement, ENSURE ENLIVE, (ENSURE ENLIVE) LIQD Take 237 mLs by mouth 2 (two) times daily between meals. Qty: 237 mL, Refills: 12    levofloxacin (LEVAQUIN) 250 MG tablet Take 1 tablet (250 mg total) by mouth daily. Qty: 3 tablet, Refills: 0    predniSONE (DELTASONE) 50 MG tablet Take 1 tablet (50 mg total) by mouth daily with breakfast. Qty: 4 tablet, Refills: 0      CONTINUE these medications which have CHANGED   Details  ALPRAZolam (XANAX) 0.5 MG tablet Take 1 tablet (0.5 mg total) by mouth 2 (two) times daily as needed. Qty: 10 tablet, Refills: 0    furosemide (LASIX) 40 MG tablet Take 1 tablet (40 mg total) by mouth daily. Start on TUesday 4/3 Qty: 30 tablet, Refills: 0      CONTINUE these medications which have NOT CHANGED   Details  aspirin 81 MG chewable tablet Chew by mouth every morning.    atorvastatin (LIPITOR)  10 MG tablet Take 10 mg by mouth every evening.    benzonatate (TESSALON) 100 MG capsule Take by mouth 3 (three) times daily.    Calcium Carbonate-Vitamin D (OYSTERCAL 500 + D PO) Take 1 tablet  by mouth 2 (two) times daily.    clopidogrel (PLAVIX) 75 MG tablet Take 75 mg by mouth every morning.     fexofenadine (ALLEGRA) 180 MG tablet Take 180 mg by mouth daily.    FLUoxetine (PROZAC) 20 MG capsule Take 1 capsule (20 mg total) by mouth daily. Qty: 30 capsule, Refills: 3    hydrALAZINE (APRESOLINE) 25 MG tablet Take 1 tablet (25 mg total) by mouth every 8 (eight) hours. Qty: 90 tablet, Refills: 0    latanoprost (XALATAN) 0.005 % ophthalmic solution Place 1 drop into both eyes at bedtime.    losartan (COZAAR) 25 MG tablet Take 25 mg by mouth daily.    metoprolol (LOPRESSOR) 25 MG tablet Take 1 tablet (25 mg total) by mouth 2 (two) times daily. Qty: 60 tablet, Refills: 0    pantoprazole (PROTONIX) 40 MG tablet Take 40 mg by mouth every morning.     tiotropium (SPIRIVA HANDIHALER) 18 MCG inhalation capsule Place 1 capsule (18 mcg total) into inhaler and inhale every morning. Qty: 30 capsule, Refills: 12    traZODone (DESYREL) 50 MG tablet Take 50 mg by mouth at bedtime.     acetaminophen (TYLENOL) 325 MG tablet Take 2 tablets (650 mg total) by mouth every 6 (six) hours as needed for mild pain (or Fever >/= 101).    albuterol (PROVENTIL HFA;VENTOLIN HFA) 108 (90 Base) MCG/ACT inhaler Inhale 2 puffs into the lungs every 6 (six) hours as needed for wheezing or shortness of breath.    albuterol (PROVENTIL) (2.5 MG/3ML) 0.083% nebulizer solution Take 2.5 mg by nebulization 4 (four) times daily as needed for wheezing or shortness of breath.    bisacodyl (DULCOLAX) 10 MG suppository Place 0.5 suppositories (5 mg total) rectally daily as needed for moderate constipation. Qty: 12 suppository, Refills: 0    docusate sodium (COLACE) 50 MG capsule Take 50 mg by mouth daily as needed.      guaiFENesin-dextromethorphan (ROBITUSSIN DM) 100-10 MG/5ML syrup Take 10 mLs by mouth every 6 (six) hours as needed for cough. Qty: 118 mL, Refills: 0    HYDROcodone-acetaminophen (NORCO/VICODIN) 5-325 MG tablet Take 1 tablet by mouth every 6 (six) hours as needed for moderate pain. Qty: 15 tablet, Refills: 0    ipratropium-albuterol (DUONEB) 0.5-2.5 (3) MG/3ML SOLN Take 3 mLs by nebulization every 4 (four) hours as needed. Qty: 360 mL, Refills: 0    mometasone-formoterol (DULERA) 100-5 MCG/ACT AERO Inhale 2 puffs into the lungs 2 (two) times daily. Qty: 1 Inhaler, Refills: 1    saccharomyces boulardii (FLORASTOR) 250 MG capsule Take 1 capsule (250 mg total) by mouth 2 (two) times daily. Qty: 30 capsule, Refills: 0    tiZANidine (ZANAFLEX) 2 MG tablet Take 2 mg by mouth 3 (three) times daily as needed for muscle spasms.       STOP taking these medications     amoxicillin-clavulanate (AUGMENTIN) 875-125 MG tablet      diclofenac sodium (VOLTAREN) 1 % GEL      predniSONE (STERAPRED UNI-PAK 21 TAB) 10 MG (21) TBPK tablet          Beta Blocker Prescribed? Yes  Was EF assessed during THIS hospitalization? No: last one in 2017 chf clinic referral made  Management plans discussed with the patient and she is in agreement. Stable for discharge home   Patient should follow up with pcp  CODE STATUS:     Code Status Orders        Start     Ordered  12/05/16 1102  Do not attempt resuscitation (DNR)  Continuous    Question Answer Comment  In the event of cardiac or respiratory ARREST Do not call a "code blue"   In the event of cardiac or respiratory ARREST Do not perform Intubation, CPR, defibrillation or ACLS   In the event of cardiac or respiratory ARREST Use medication by any route, position, wound care, and other measures to relive pain and suffering. May use oxygen, suction and manual treatment of airway obstruction as needed for comfort.      12/05/16 1102    Code  Status History    Date Active Date Inactive Code Status Order ID Comments User Context   10/05/2016 11:02 PM 10/10/2016 11:54 PM Full Code [id number]  Lance Coon, MD Inpatient   06/10/2016 10:32 AM 06/10/2016 10:50 AM Full Code [id number]  Theodoro Grist, MD Inpatient   09/14/2015  8:19 PM 09/16/2015  8:34 PM Full Code [id number]  Theodoro Grist, MD Inpatient   08/27/2015  8:05 AM 09/01/2015  8:42 PM Full Code [id number]  Harrie Foreman, MD Inpatient      TOTAL TIME TAKING CARE OF THIS PATIENT: 38 minutes.    Note: This dictation was prepared with Dragon dictation along with smaller phrase technology. Any transcriptional errors that result from this process are unintentional.  Karolyn Messing M.D on 12/08/2016 at 11:11 AM  Between 7am to 6pm - Pager - [phone] After 6pm go to [email or web address] - password [email or web address]  Office  [phone]  CC: Primary care physician; Volanda Napoleon, MD

## 2016-12-17 ENCOUNTER — Ambulatory Visit: Payer: Medicare Other | Admitting: Family

## 2016-12-17 ENCOUNTER — Telehealth: Payer: Self-pay | Admitting: Family

## 2016-12-17 NOTE — Telephone Encounter (Signed)
Patient did not show for her Heart Failure Clinic appointment on 12/17/16. Will attempt to reschedule.

## 2016-12-24 ENCOUNTER — Ambulatory Visit: Payer: Medicare Other | Admitting: Family

## 2016-12-24 ENCOUNTER — Ambulatory Visit: Payer: Medicare Other | Attending: Family | Admitting: Family

## 2016-12-24 ENCOUNTER — Encounter: Payer: Self-pay | Admitting: Family

## 2016-12-24 VITALS — BP 72/52 | HR 88 | Resp 20 | Ht 61.0 in | Wt 162.1 lb

## 2016-12-24 DIAGNOSIS — I959 Hypotension, unspecified: Secondary | ICD-10-CM | POA: Insufficient documentation

## 2016-12-24 DIAGNOSIS — Z8249 Family history of ischemic heart disease and other diseases of the circulatory system: Secondary | ICD-10-CM | POA: Insufficient documentation

## 2016-12-24 DIAGNOSIS — Z720 Tobacco use: Secondary | ICD-10-CM

## 2016-12-24 DIAGNOSIS — I5022 Chronic systolic (congestive) heart failure: Secondary | ICD-10-CM | POA: Insufficient documentation

## 2016-12-24 DIAGNOSIS — N179 Acute kidney failure, unspecified: Secondary | ICD-10-CM | POA: Diagnosis not present

## 2016-12-24 DIAGNOSIS — Z885 Allergy status to narcotic agent status: Secondary | ICD-10-CM | POA: Insufficient documentation

## 2016-12-24 DIAGNOSIS — I252 Old myocardial infarction: Secondary | ICD-10-CM | POA: Insufficient documentation

## 2016-12-24 DIAGNOSIS — Z951 Presence of aortocoronary bypass graft: Secondary | ICD-10-CM | POA: Diagnosis not present

## 2016-12-24 DIAGNOSIS — Z9049 Acquired absence of other specified parts of digestive tract: Secondary | ICD-10-CM | POA: Diagnosis not present

## 2016-12-24 DIAGNOSIS — Z90710 Acquired absence of both cervix and uterus: Secondary | ICD-10-CM | POA: Insufficient documentation

## 2016-12-24 DIAGNOSIS — J449 Chronic obstructive pulmonary disease, unspecified: Secondary | ICD-10-CM | POA: Diagnosis not present

## 2016-12-24 DIAGNOSIS — Z7982 Long term (current) use of aspirin: Secondary | ICD-10-CM | POA: Insufficient documentation

## 2016-12-24 DIAGNOSIS — I952 Hypotension due to drugs: Secondary | ICD-10-CM

## 2016-12-24 DIAGNOSIS — F17210 Nicotine dependence, cigarettes, uncomplicated: Secondary | ICD-10-CM | POA: Diagnosis not present

## 2016-12-24 NOTE — Progress Notes (Signed)
Patient ID: Debra [name], female    DOB: 06-02-1933, 83 y.o.   MRN: [id number]  HPI  Debra [name] is a 83 y/o female with a history of a MI, COPD, asthma, recent and long-term tobacco use and chronic heart failure.  Last echo was done 06/10/16 which showed an EF of 45% with trivial MR and mild TR.   Admitted 12/05/16 with HF exacerbation. Discharged with prednisone and levaquin after 3 days. Admitted 10/05/16 with COPD exacerbation and pneumonia. Discharged to SNF after 5 days. Admitted on 06/10/16 with exacerbation of her COPD and HF. Was treated with bipap, nebulizers and antibiotics. Had acute kidney injury due to over diuresis which resolved before discharge. Was discharged to [facility] for rehab.   Debra [name] presents today for her follow-up visit with a chief complaint of shortness of breath. Debra [name] describes it as mild in nature although has been present for months. Debra [name] does feel like her breathing is improving. Exertion makes it worse and it improves with rest as well as when Debra [name] wears her oxygen. Has associated fatigue and cough producing clear sputum.   Past Medical History:  Diagnosis Date  . Asthma   . CHF (congestive heart failure) ([place])   . COPD (chronic obstructive pulmonary disease) ([place])   . Myocardial infarction [facility])    Past Surgical History:  Procedure Laterality Date  . ABDOMINAL HYSTERECTOMY    . BREAST SURGERY    . BYPASS GRAFT  2007   triple  . CHOLECYSTECTOMY    . CORONARY ARTERY BYPASS GRAFT    . TONSILLECTOMY     Family History  Problem Relation Age of Onset  . CAD Father    Social History  Substance Use Topics  . Smoking status: Current Every Day Smoker    Packs/day: 0.50    Years: 55.00  . Smokeless tobacco: Never Used  . Alcohol use No   Allergies  Allergen Reactions  . Codeine Itching and Rash   Prior to Admission medications   Medication Sig Start Date End Date Taking? Authorizing Provider  acetaminophen (TYLENOL) 325 MG tablet Take 2 tablets  (650 mg total) by mouth every 6 (six) hours as needed for mild pain (or Fever >/= 101). 10/10/16  Yes Nicholes Mango, MD  albuterol (PROVENTIL HFA;VENTOLIN HFA) 108 (90 Base) MCG/ACT inhaler Inhale 2 puffs into the lungs every 6 (six) hours as needed for wheezing or shortness of breath.   Yes Historical Provider, MD  albuterol (PROVENTIL) (2.5 MG/3ML) 0.083% nebulizer solution Take 2.5 mg by nebulization 4 (four) times daily as needed for wheezing or shortness of breath.   Yes Historical Provider, MD  ALPRAZolam Duanne Moron) 0.5 MG tablet Take 1 tablet (0.5 mg total) by mouth 2 (two) times daily as needed. 12/08/16  Yes Bettey Costa, MD  aspirin 81 MG chewable tablet Chew by mouth every morning.   Yes Historical Provider, MD  atorvastatin (LIPITOR) 10 MG tablet Take 10 mg by mouth every evening. 05/16/16  Yes Historical Provider, MD  benzonatate (TESSALON) 100 MG capsule Take by mouth 3 (three) times daily.   Yes Historical Provider, MD  bisacodyl (DULCOLAX) 10 MG suppository Place 0.5 suppositories (5 mg total) rectally daily as needed for moderate constipation. 10/10/16  Yes Nicholes Mango, MD  Calcium Carbonate-Vitamin D (OYSTERCAL 500 + D PO) Take 1 tablet by mouth 2 (two) times daily.   Yes Historical Provider, MD  clopidogrel (PLAVIX) 75 MG tablet Take 75 mg by mouth every morning.    Yes  Historical Provider, MD  docusate sodium (COLACE) 50 MG capsule Take 50 mg by mouth daily as needed.    Yes Historical Provider, MD  feeding supplement, ENSURE ENLIVE, (ENSURE ENLIVE) LIQD Take 237 mLs by mouth 2 (two) times daily between meals. 12/08/16  Yes Sital Mody, MD  fexofenadine (ALLEGRA) 180 MG tablet Take 180 mg by mouth daily.   Yes Historical Provider, MD  FLUoxetine (PROZAC) 20 MG capsule Take 1 capsule (20 mg total) by mouth daily. 09/01/15  Yes Aldean Jewett, MD  furosemide (LASIX) 40 MG tablet Take 1 tablet (40 mg total) by mouth daily. Start on TUesday 4/3 12/08/16  Yes Sital Mody, MD   guaiFENesin-dextromethorphan (ROBITUSSIN DM) 100-10 MG/5ML syrup Take 10 mLs by mouth every 6 (six) hours as needed for cough. 10/10/16  Yes Nicholes Mango, MD  hydrALAZINE (APRESOLINE) 25 MG tablet Take 1 tablet (25 mg total) by mouth every 8 (eight) hours. 10/10/16  Yes Nicholes Mango, MD  HYDROcodone-acetaminophen (NORCO/VICODIN) 5-325 MG tablet Take 1 tablet by mouth every 6 (six) hours as needed for moderate pain. 10/10/16  Yes Nicholes Mango, MD  ipratropium-albuterol (DUONEB) 0.5-2.5 (3) MG/3ML SOLN Take 3 mLs by nebulization every 4 (four) hours as needed. 10/10/16  Yes Nicholes Mango, MD  latanoprost (XALATAN) 0.005 % ophthalmic solution Place 1 drop into both eyes at bedtime. 05/10/16  Yes Historical Provider, MD  losartan (COZAAR) 25 MG tablet Take 25 mg by mouth daily.   Yes Historical Provider, MD  metoprolol (LOPRESSOR) 25 MG tablet Take 1 tablet (25 mg total) by mouth 2 (two) times daily. 10/10/16  Yes Nicholes Mango, MD  mometasone-formoterol (DULERA) 100-5 MCG/ACT AERO Inhale 2 puffs into the lungs 2 (two) times daily. 09/01/15  Yes Aldean Jewett, MD  pantoprazole (PROTONIX) 40 MG tablet Take 40 mg by mouth every morning.    Yes Historical Provider, MD  saccharomyces boulardii (FLORASTOR) 250 MG capsule Take 1 capsule (250 mg total) by mouth 2 (two) times daily. 10/10/16  Yes Nicholes Mango, MD  tiotropium (SPIRIVA HANDIHALER) 18 MCG inhalation capsule Place 1 capsule (18 mcg total) into inhaler and inhale every morning. 09/01/15  Yes Aldean Jewett, MD  tiZANidine (ZANAFLEX) 2 MG tablet Take 2 mg by mouth 3 (three) times daily as needed for muscle spasms.    Yes Historical Provider, MD  traZODone (DESYREL) 50 MG tablet Take 50 mg by mouth at bedtime.    Yes Historical Provider, MD    Review of Systems  Constitutional: Positive for fatigue. Negative for appetite change.  HENT: Negative for congestion, postnasal drip and sore throat.   Eyes: Negative.   Respiratory: Positive for cough (clear  sputum) and shortness of breath. Negative for chest tightness and wheezing.   Cardiovascular: Negative for chest pain, palpitations and leg swelling.  Gastrointestinal: Negative for abdominal distention and abdominal pain.  Endocrine: Negative.   Genitourinary: Negative.   Musculoskeletal: Negative for back pain and neck pain.  Skin: Negative.   Allergic/Immunologic: Negative.   Neurological: Negative for dizziness and light-headedness.  Hematological: Negative for adenopathy. Does not bruise/bleed easily.  Psychiatric/Behavioral: Negative for dysphoric mood and sleep disturbance (sleeping on 1 pillow with oxygen around the clock). The patient is not nervous/anxious.    Vitals:   12/24/16 1124  BP: (!) 72/52  Pulse: 88  Resp: 20  SpO2: 95%  Weight: 162 lb 2 oz (73.5 kg)  Height: '5\' 1"'$  (1.549 m)   Wt Readings from Last 3 Encounters:  12/24/16 162 lb  2 oz (73.5 kg)  12/08/16 158 lb 6.4 oz (71.8 kg)  10/10/16 171 lb (77.6 kg)   Lab Results  Component Value Date   CREATININE 1.25 (H) 12/08/2016   CREATININE 1.03 (H) 12/07/2016   CREATININE 0.84 12/06/2016   Physical Exam  Constitutional: Debra [name] is oriented to person, place, and time. Debra [name] appears well-developed and well-nourished.  Neck: Normal range of motion. Neck supple. No JVD present.  Cardiovascular: Normal rate and regular rhythm.   Pulmonary/Chest: Effort normal. Debra [name] has no wheezes. Debra [name] has rhonchi in the right lower field. Debra [name] has no rales.  Abdominal: Soft. Debra [name] exhibits no distension. There is no tenderness.  Musculoskeletal: Debra [name] exhibits no edema or tenderness.  Neurological: Debra [name] is alert and oriented to person, place, and time.  Skin: Skin is warm and dry.  Psychiatric: Debra [name] has a normal mood and affect. Her behavior is normal.  Nursing note and vitals reviewed.    Assessment & Plan:  1: Chronic heart failure with reduced ejection fraction- - NYHA class II - euvolemic - continue weighing daily and call for  overnight weight gain of >2 pounds or a weekly weight gain of >5 pounds.  - does eat salty type foods at home like nabs and importance of following a '2000mg'$  sodium diet was discussed - could consider changing losartan to entresto if her blood pressure improves  2: Hypotension- - BP continues to be low - taking furosemide as needed based on weight/edema - sees PCP 4/25/1  3: Tobacco use- - smoking 1/2 ppd - does remove oxygen and doesn't smoke close by it - continued cessation discussed for 3 minutes with her  Patient did not bring her medications nor a list. Each medication was verbally reviewed with the patient and Debra [name] was encouraged to bring the bottles to every visit to confirm accuracy of list.  Patient opts to not make a return appointment at this time. Advised her that Debra [name] could call back at anytime to make another appointment.   Return here in 2 weeks or sooner for any questions/problems before then.

## 2016-12-24 NOTE — Patient Instructions (Signed)
Continue weighing daily and call for an overnight weight gain of > 2 pounds or a weekly weight gain of >5 pounds.    Smoking Cessation Quitting smoking is important to your health and has many advantages. However, it is not always easy to quit since nicotine is a very addictive drug. Oftentimes, people try 3 times or more before being able to quit. This document explains the best ways for you to prepare to quit smoking. Quitting takes hard work and a lot of effort, but you can do it. ADVANTAGES OF QUITTING SMOKING  You will live longer, feel better, and live better.  Your body will feel the impact of quitting smoking almost immediately.  Within 20 minutes, blood pressure decreases. Your pulse returns to its normal level.  After 8 hours, carbon monoxide levels in the blood return to normal. Your oxygen level increases.  After 24 hours, the chance of having a heart attack starts to decrease. Your breath, hair, and body stop smelling like smoke.  After 48 hours, damaged nerve endings begin to recover. Your sense of taste and smell improve.  After 72 hours, the body is virtually free of nicotine. Your bronchial tubes relax and breathing becomes easier.  After 2 to 12 weeks, lungs can hold more air. Exercise becomes easier and circulation improves.  The risk of having a heart attack, stroke, cancer, or lung disease is greatly reduced.  After 1 year, the risk of coronary heart disease is cut in half.  After 5 years, the risk of stroke falls to the same as a nonsmoker.  After 10 years, the risk of lung cancer is cut in half and the risk of other cancers decreases significantly.  After 15 years, the risk of coronary heart disease drops, usually to the level of a nonsmoker.  If you are pregnant, quitting smoking will improve your chances of having a healthy baby.  The people you live with, especially any children, will be healthier.  You will have extra money to spend on things other  than cigarettes. QUESTIONS TO THINK ABOUT BEFORE ATTEMPTING TO QUIT You may want to talk about your answers with your health care provider.  Why do you want to quit?  If you tried to quit in the past, what helped and what did not?  What will be the most difficult situations for you after you quit? How will you plan to handle them?  Who can help you through the tough times? Your family? Friends? A health care provider?  What pleasures do you get from smoking? What ways can you still get pleasure if you quit? Here are some questions to ask your health care provider:  How can you help me to be successful at quitting?  What medicine do you think would be best for me and how should I take it?  What should I do if I need more help?  What is smoking withdrawal like? How can I get information on withdrawal? GET READY  Set a quit date.  Change your environment by getting rid of all cigarettes, ashtrays, matches, and lighters in your home, car, or work. Do not let people smoke in your home.  Review your past attempts to quit. Think about what worked and what did not. GET SUPPORT AND ENCOURAGEMENT You have a better chance of being successful if you have help. You can get support in many ways.  Tell your family, friends, and coworkers that you are going to quit and need their support. Ask   them not to smoke around you.  Get individual, group, or telephone counseling and support. Programs are available at local hospitals and health centers. Call your local health department for information about programs in your area.  Spiritual beliefs and practices may help some smokers quit.  Download a "quit meter" on your computer to keep track of quit statistics, such as how long you have gone without smoking, cigarettes not smoked, and money saved.  Get a self-help book about quitting smoking and staying off tobacco. LEARN NEW SKILLS AND BEHAVIORS  Distract yourself from urges to smoke. Talk to  someone, go for a walk, or occupy your time with a task.  Change your normal routine. Take a different route to work. Drink tea instead of coffee. Eat breakfast in a different place.  Reduce your stress. Take a hot bath, exercise, or read a book.  Plan something enjoyable to do every day. Reward yourself for not smoking.  Explore interactive web-based programs that specialize in helping you quit. GET MEDICINE AND USE IT CORRECTLY Medicines can help you stop smoking and decrease the urge to smoke. Combining medicine with the above behavioral methods and support can greatly increase your chances of successfully quitting smoking.  Nicotine replacement therapy helps deliver nicotine to your body without the negative effects and risks of smoking. Nicotine replacement therapy includes nicotine gum, lozenges, inhalers, nasal sprays, and skin patches. Some may be available over-the-counter and others require a prescription.  Antidepressant medicine helps people abstain from smoking, but how this works is unknown. This medicine is available by prescription.  Nicotinic receptor partial agonist medicine simulates the effect of nicotine in your brain. This medicine is available by prescription. Ask your health care provider for advice about which medicines to use and how to use them based on your health history. Your health care provider will tell you what side effects to look out for if you choose to be on a medicine or therapy. Carefully read the information on the package. Do not use any other product containing nicotine while using a nicotine replacement product.  RELAPSE OR DIFFICULT SITUATIONS Most relapses occur within the first 3 months after quitting. Do not be discouraged if you start smoking again. Remember, most people try several times before finally quitting. You may have symptoms of withdrawal because your body is used to nicotine. You may crave cigarettes, be irritable, feel very hungry, cough  often, get headaches, or have difficulty concentrating. The withdrawal symptoms are only temporary. They are strongest when you first quit, but they will go away within 10-14 days. To reduce the chances of relapse, try to:  Avoid drinking alcohol. Drinking lowers your chances of successfully quitting.  Reduce the amount of caffeine you consume. Once you quit smoking, the amount of caffeine in your body increases and can give you symptoms, such as a rapid heartbeat, sweating, and anxiety.  Avoid smokers because they can make you want to smoke.  Do not let weight gain distract you. Many smokers will gain weight when they quit, usually less than 10 pounds. Eat a healthy diet and stay active. You can always lose the weight gained after you quit.  Find ways to improve your mood other than smoking. FOR MORE INFORMATION  www.smokefree.gov  Document Released: 08/21/2001 Document Revised: 01/11/2014 Document Reviewed: 12/06/2011 ExitCare Patient Information 2015 [employer]. This information is not intended to replace advice given to you by your health care provider. Make sure you discuss any questions you have with your   health care provider.  

## 2017-10-07 IMAGING — CR DG ABDOMEN 1V
1 series · 2 of 2 positions shown · non-contrast
Comparison: None.

CLINICAL DATA: Weakness

EXAM:
ABDOMEN - 1 VIEW

[Series 1: dg abd 1 view · 0.14mm/px · 2 of 2 slices shown]
[im 1/2]
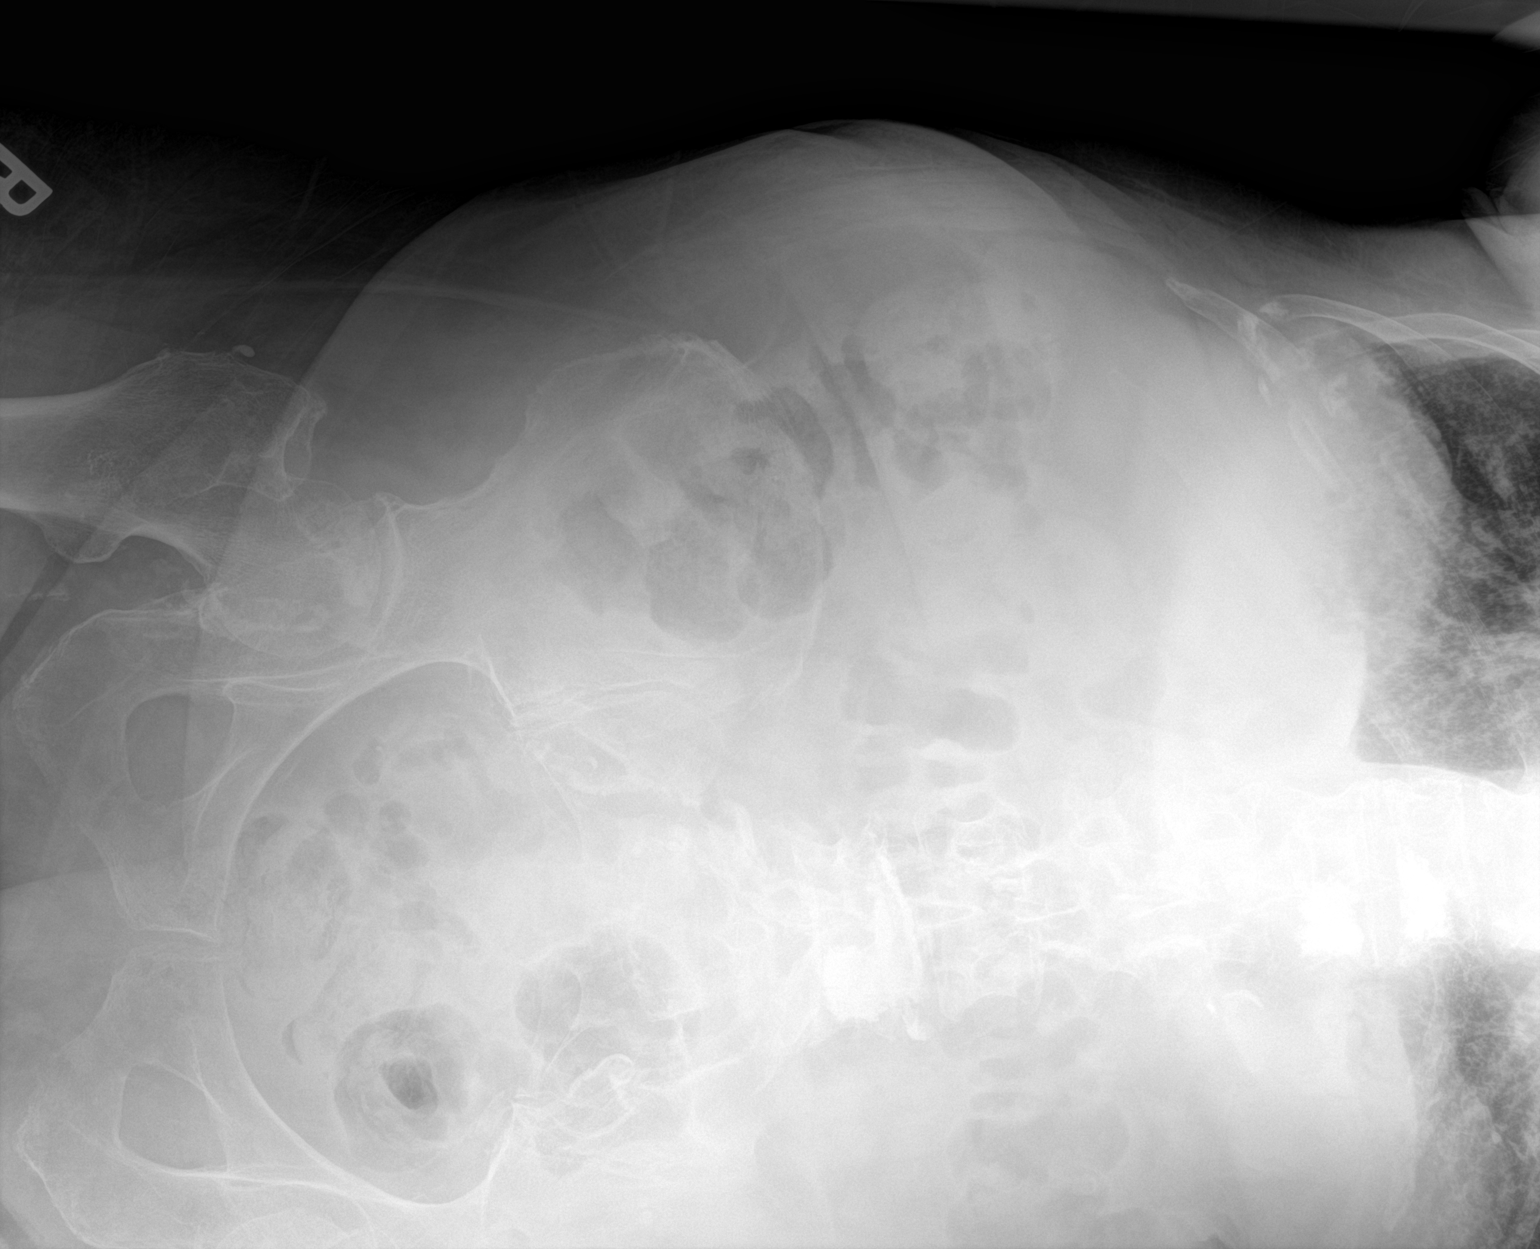
[im 2/2]
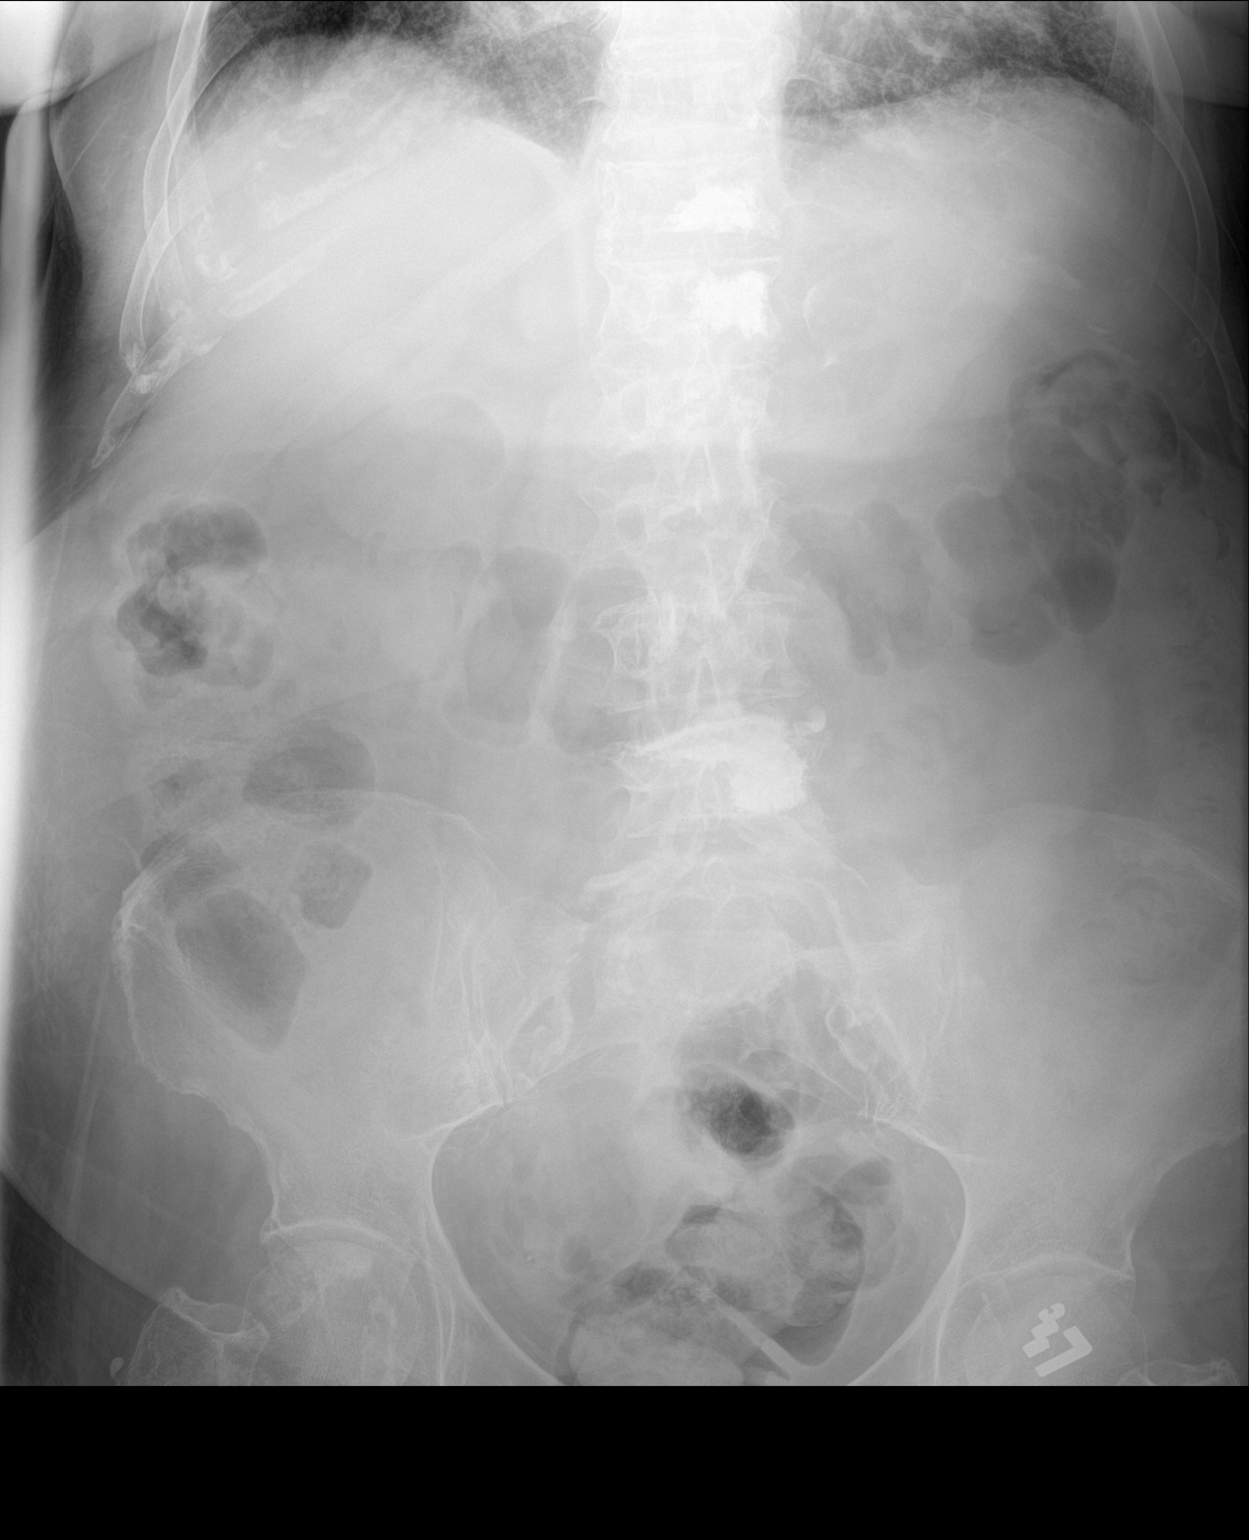

[2 of 2 positions shown; findings below may reference images not displayed]

FINDINGS: Scattered large and small bowel gas is noted. No obstructive changes
are seen. No free air is noted. Evidence of prior vertebral
augmentation is noted at multiple levels. No acute bony abnormality
is seen.
IMPRESSION: No acute abnormality noted.

## 2017-10-22 ENCOUNTER — Emergency Department: Payer: Medicare Other

## 2017-10-22 ENCOUNTER — Emergency Department
Admission: EM | Admit: 2017-10-22 | Discharge: 2017-10-22 | Disposition: A | Discharge status: Home / Self Care | Payer: Medicare Other | Attending: Emergency Medicine | Admitting: Emergency Medicine

## 2017-10-22 ENCOUNTER — Other Ambulatory Visit: Payer: Self-pay

## 2017-10-22 DIAGNOSIS — Z7982 Long term (current) use of aspirin: Secondary | ICD-10-CM | POA: Insufficient documentation

## 2017-10-22 DIAGNOSIS — R0602 Shortness of breath: Secondary | ICD-10-CM | POA: Diagnosis present

## 2017-10-22 DIAGNOSIS — Z7901 Long term (current) use of anticoagulants: Secondary | ICD-10-CM | POA: Diagnosis not present

## 2017-10-22 DIAGNOSIS — Z79899 Other long term (current) drug therapy: Secondary | ICD-10-CM | POA: Diagnosis not present

## 2017-10-22 DIAGNOSIS — I5022 Chronic systolic (congestive) heart failure: Secondary | ICD-10-CM | POA: Insufficient documentation

## 2017-10-22 DIAGNOSIS — J441 Chronic obstructive pulmonary disease with (acute) exacerbation: Secondary | ICD-10-CM

## 2017-10-22 DIAGNOSIS — F17210 Nicotine dependence, cigarettes, uncomplicated: Secondary | ICD-10-CM | POA: Diagnosis not present

## 2017-10-22 DIAGNOSIS — Z9981 Dependence on supplemental oxygen: Secondary | ICD-10-CM | POA: Diagnosis not present

## 2017-10-22 DIAGNOSIS — J45909 Unspecified asthma, uncomplicated: Secondary | ICD-10-CM | POA: Diagnosis not present

## 2017-10-22 DIAGNOSIS — I252 Old myocardial infarction: Secondary | ICD-10-CM | POA: Insufficient documentation

## 2017-10-22 LAB — BASIC METABOLIC PANEL
ANION GAP: 8 (ref 5–15)
BUN: 24 mg/dL — ABNORMAL HIGH (ref 6–20)
CALCIUM: 9.3 mg/dL (ref 8.9–10.3)
CHLORIDE: 99 mmol/L — AB (ref 101–111)
CO2: 31 mmol/L (ref 22–32)
Creatinine, Ser: 1.25 mg/dL — ABNORMAL HIGH (ref 0.44–1.00)
GFR calc non Af Amer: 39 mL/min — ABNORMAL LOW (ref >60)
GFR, EST AFRICAN AMERICAN: 45 mL/min — AB (ref >60)
Glucose, Bld: 108 mg/dL — ABNORMAL HIGH (ref 65–99)
Potassium: 4.7 mmol/L (ref 3.5–5.1)
SODIUM: 138 mmol/L (ref 135–145)

## 2017-10-22 LAB — CBC
HCT: 37.6 % (ref 35.0–47.0)
HEMOGLOBIN: 12.5 g/dL (ref 12.0–16.0)
MCH: 34.3 pg — ABNORMAL HIGH (ref 26.0–34.0)
MCHC: 33.2 g/dL (ref 32.0–36.0)
MCV: 103.5 fL — AB (ref 80.0–100.0)
PLATELETS: 215 10*3/uL (ref 150–440)
RBC: 3.63 MIL/uL — AB (ref 3.80–5.20)
RDW: 14.2 % (ref 11.5–14.5)
WBC: 6.8 10*3/uL (ref 3.6–11.0)

## 2017-10-22 LAB — INFLUENZA PANEL BY PCR (TYPE A & B)
INFLAPCR: NEGATIVE
Influenza B By PCR: NEGATIVE

## 2017-10-22 LAB — TROPONIN I: Troponin I: <0.03 ng/mL (ref <0.03)

## 2017-10-22 LAB — LIPASE, BLOOD: LIPASE: 23 U/L (ref 11–51)

## 2017-10-22 LAB — BRAIN NATRIURETIC PEPTIDE: B Natriuretic Peptide: 161 pg/mL — ABNORMAL HIGH (ref 0.0–100.0)

## 2017-10-22 LAB — HEPATIC FUNCTION PANEL
ALK PHOS: 53 U/L (ref 38–126)
ALT: 11 U/L — ABNORMAL LOW (ref 14–54)
AST: 23 U/L (ref 15–41)
Albumin: 3.9 g/dL (ref 3.5–5.0)
BILIRUBIN TOTAL: 0.8 mg/dL (ref 0.3–1.2)
Total Protein: 7.4 g/dL (ref 6.5–8.1)

## 2017-10-22 MED ORDER — METHYLPREDNISOLONE SODIUM SUCC 125 MG IJ SOLR
125.0000 mg | Freq: Once | INTRAMUSCULAR | Status: AC
  Administered 2017-10-22: 125 mg via INTRAVENOUS
  Filled 2017-10-22: qty 2

## 2017-10-22 MED ORDER — IPRATROPIUM-ALBUTEROL 0.5-2.5 (3) MG/3ML IN SOLN
3.0000 mL | Freq: Once | RESPIRATORY_TRACT | Status: AC
  Administered 2017-10-22: 3 mL via RESPIRATORY_TRACT
  Filled 2017-10-22: qty 3

## 2017-10-22 MED ORDER — PREDNISONE 20 MG PO TABS: 60.0000 mg | ORAL_TABLET | Freq: Every day | ORAL | 0 refills | Status: DC

## 2017-10-22 NOTE — Discharge Instructions (Signed)
We believe that your symptoms are caused today by an exacerbation of your COPD.  Please take the prescribed medications and any medications that you have at home for your COPD.  Follow up with your doctor as recommended.  If you develop any new or worsening symptoms, including but not limited to fever, persistent vomiting, worsening shortness of breath, or other symptoms that concern you, please return to the Emergency Department immediately.  

## 2017-10-22 NOTE — ED Triage Notes (Signed)
Pt c/o increased SOB for the past 2 days with productive cough with thick yellow mucus. Pt is on 2L [place] continuous at home. Pt is in NAD at present.

## 2017-10-22 NOTE — ED Provider Notes (Signed)
[facility] Emergency Department Provider Note  ____________________________________________   First MD Initiated Contact with Patient 10/22/17 1138     (approximate)  I have reviewed the triage vital signs and the nursing notes.   HISTORY  Chief Complaint Shortness of Breath    HPI Debra [name] is a 84 y.o. female with medical history as listed below which includesCOPD on 2 L of oxygen at baseline as well as relatively mild CHF and who continues to smoke.  She presents for evaluation of gradually worsening shortness of breath and productive cough over the last 2 days.  She describes her symptoms as severe and they are worse with exertion, better with rest.  She has not noted any recent swelling in her extremities.  She denies fever/chills, chest pain, nausea, vomiting, abdominal pain, and dysuria.  She does not have a sore throat, nasal congestion/runny nose, no difficulty swallowing.  She has been taking her regular medications.   Past Medical History:  Diagnosis Date  . Asthma   . CHF (congestive heart failure) ([place])   . COPD (chronic obstructive pulmonary disease) ([place])   . Myocardial infarction [facility])     Patient Active Problem List   Diagnosis Date Noted  . CHF (congestive heart failure) ([place]) 12/05/2016  . CAP (community acquired pneumonia) 10/05/2016  . Chronic systolic heart failure ([place]) 06/28/2016  . COPD (chronic obstructive pulmonary disease) with emphysema ([place]) 06/28/2016  . Tobacco use 06/28/2016  . Acidosis 09/14/2015  . Hypotension 09/14/2015  . Hypokalemia 09/14/2015  . COPD exacerbation ([place]) 09/01/2015  . Sepsis ([place]) 08/27/2015    Past Surgical History:  Procedure Laterality Date  . ABDOMINAL HYSTERECTOMY    . BREAST SURGERY    . BYPASS GRAFT  2007   triple  . CHOLECYSTECTOMY    . CORONARY ARTERY BYPASS GRAFT    . TONSILLECTOMY      Prior to Admission medications   Medication Sig Start Date End Date Taking?  Authorizing Provider  albuterol (PROVENTIL HFA;VENTOLIN HFA) 108 (90 Base) MCG/ACT inhaler Inhale 2 puffs into the lungs every 6 (six) hours as needed for wheezing or shortness of breath.   Yes [provider]  albuterol (PROVENTIL) (2.5 MG/3ML) 0.083% nebulizer solution Take 2.5 mg by nebulization 4 (four) times daily as needed for wheezing or shortness of breath.   Yes [provider]  ALPRAZolam (XANAX) 0.5 MG tablet Take 1 tablet (0.5 mg total) by mouth 2 (two) times daily as needed. 12/08/16  Yes Bettey Costa, MD  aspirin 81 MG chewable tablet Chew by mouth every morning.   Yes [provider]  atorvastatin (LIPITOR) 10 MG tablet Take 10 mg by mouth every evening. 05/16/16  Yes [provider]  benzonatate (TESSALON) 100 MG capsule Take 100 mg by mouth 3 (three) times daily as needed for cough.    Yes [provider]  Calcium Carbonate-Vitamin D (OYSTERCAL 500 + D PO) Take 1 tablet by mouth 2 (two) times daily.   Yes [provider]  celecoxib (CELEBREX) 200 MG capsule Take 200 mg by mouth 2 (two) times daily. 09/09/17  Yes [provider]  clopidogrel (PLAVIX) 75 MG tablet Take 75 mg by mouth every morning.    Yes [provider]  docusate sodium (COLACE) 50 MG capsule Take 50 mg by mouth daily as needed.    Yes [provider]  fexofenadine (ALLEGRA) 180 MG tablet Take 180 mg by mouth daily as needed for allergies.  Yes [provider]  FLUoxetine (PROZAC) 20 MG capsule Take 1 capsule (20 mg total) by mouth daily. 09/01/15  Yes Aldean Jewett, MD  furosemide (LASIX) 40 MG tablet Take 1 tablet (40 mg total) by mouth daily. Start on TUesday 4/3 12/08/16  Yes Mody, Ulice Bold, MD  hydrALAZINE (APRESOLINE) 25 MG tablet Take 1 tablet (25 mg total) by mouth every 8 (eight) hours. 10/10/16  Yes Gouru, Illene Silver, MD  HYDROcodone-acetaminophen (NORCO/VICODIN) 5-325 MG tablet Take 1 tablet by mouth every 6 (six) hours as  needed for moderate pain. 10/10/16  Yes Gouru, Aruna, MD  ipratropium-albuterol (DUONEB) 0.5-2.5 (3) MG/3ML SOLN Take 3 mLs by nebulization every 4 (four) hours as needed. 10/10/16  Yes Gouru, Aruna, MD  latanoprost (XALATAN) 0.005 % ophthalmic solution Place 1 drop into both eyes at bedtime. 05/10/16  Yes [provider]  losartan (COZAAR) 25 MG tablet Take 25 mg by mouth daily.   Yes [provider]  metoprolol (LOPRESSOR) 25 MG tablet Take 1 tablet (25 mg total) by mouth 2 (two) times daily. 10/10/16  Yes Gouru, Illene Silver, MD  pantoprazole (PROTONIX) 40 MG tablet Take 40 mg by mouth every morning.    Yes [provider]  tiotropium (SPIRIVA HANDIHALER) 18 MCG inhalation capsule Place 1 capsule (18 mcg total) into inhaler and inhale every morning. 09/01/15  Yes Aldean Jewett, MD  tiZANidine (ZANAFLEX) 2 MG tablet Take 2 mg by mouth 3 (three) times daily as needed for muscle spasms.    Yes [provider]  traZODone (DESYREL) 50 MG tablet Take 50 mg by mouth at bedtime.    Yes [provider]  acetaminophen (TYLENOL) 325 MG tablet Take 2 tablets (650 mg total) by mouth every 6 (six) hours as needed for mild pain (or Fever >/= 101). Patient not taking: Reported on 10/22/2017 10/10/16   Nicholes Mango, MD  bisacodyl (DULCOLAX) 10 MG suppository Place 0.5 suppositories (5 mg total) rectally daily as needed for moderate constipation. Patient not taking: Reported on 10/22/2017 10/10/16   Nicholes Mango, MD  feeding supplement, ENSURE ENLIVE, (ENSURE ENLIVE) LIQD Take 237 mLs by mouth 2 (two) times daily between meals. 12/08/16   Bettey Costa, MD  guaiFENesin-dextromethorphan (ROBITUSSIN DM) 100-10 MG/5ML syrup Take 10 mLs by mouth every 6 (six) hours as needed for cough. Patient not taking: Reported on 10/22/2017 10/10/16   Nicholes Mango, MD  mometasone-formoterol (DULERA) 100-5 MCG/ACT AERO Inhale 2 puffs into the lungs 2 (two) times daily. Patient not taking: Reported on  10/22/2017 09/01/15   Aldean Jewett, MD  predniSONE (DELTASONE) 20 MG tablet Take 3 tablets (60 mg total) by mouth daily. 10/22/17   Hinda Kehr, MD  saccharomyces boulardii (FLORASTOR) 250 MG capsule Take 1 capsule (250 mg total) by mouth 2 (two) times daily. Patient not taking: Reported on 10/22/2017 10/10/16   Nicholes Mango, MD    Allergies Codeine  Family History  Problem Relation Age of Onset  . CAD Father     Social History Social History   Tobacco Use  . Smoking status: Current Every Day Smoker    Packs/day: 0.50    Years: 55.00    Pack years: 27.50  . Smokeless tobacco: Never Used  Substance Use Topics  . Alcohol use: No  . Drug use: No    Review of Systems Constitutional: No fever/chills Eyes: No visual changes. ENT: No sore throat. Cardiovascular: Denies chest pain. Respiratory: Worsening shortness of breath and productive cough over the last 2 days  as described above Gastrointestinal: No abdominal pain.  No nausea, no vomiting.  No diarrhea.  No constipation. Genitourinary: Negative for dysuria. Musculoskeletal: Negative for neck pain.  Negative for back pain. Integumentary: Negative for rash. Neurological: Negative for headaches, focal weakness or numbness.   ____________________________________________   PHYSICAL EXAM:  VITAL SIGNS: ED Triage Vitals  Enc Vitals Group     BP 10/22/17 1039 110/81     Pulse Rate 10/22/17 1039 93     Resp 10/22/17 1039 (!) 24     Temp 10/22/17 1039 98 F (36.7 C)     Temp Source 10/22/17 1039 Oral     SpO2 10/22/17 1039 95 %     Weight 10/22/17 1040 73.9 kg (163 lb)     Height 10/22/17 1040 1.549 m (5\' 1" )     Head Circumference --      Peak Flow --      Pain Score 10/22/17 1051 9     Pain Loc --      Pain Edu? --      Excl. in [place]? --     Constitutional: Alert and oriented.  Appears chronically ill but is in no acute distress at this time although she does have frequent coughing episodes Eyes: Conjunctivae  are normal.  Head: Atraumatic. Nose: No congestion/rhinnorhea. Mouth/Throat: Mucous membranes are moist. Neck: No stridor.  No meningeal signs.   Cardiovascular: Normal rate, regular rhythm. Good peripheral circulation. Grossly normal heart sounds. Respiratory: Coarse breath sounds throughout with expiratory wheezing.  No intercostal retractions.  Presentation is most consistent with COPD exacerbation Gastrointestinal: Soft and nontender. No distention.  Musculoskeletal: No lower extremity tenderness nor edema. No gross deformities of extremities. Neurologic:  Normal speech and language. No gross focal neurologic deficits are appreciated.  Skin:  Skin is warm, dry and intact. No rash noted. Psychiatric: Mood and affect are normal. Speech and behavior are normal.  ____________________________________________   LABS (all labs ordered are listed, but only abnormal results are displayed)  Labs Reviewed  BASIC METABOLIC PANEL - Abnormal; Notable for the following components:      Result Value   Chloride 99 (*)    Glucose, Bld 108 (*)    BUN 24 (*)    Creatinine, Ser 1.25 (*)    GFR calc non Af Amer 39 (*)    GFR calc Af Amer 45 (*)    All other components within normal limits  CBC - Abnormal; Notable for the following components:   RBC 3.63 (*)    MCV 103.5 (*)    MCH 34.3 (*)    All other components within normal limits  HEPATIC FUNCTION PANEL - Abnormal; Notable for the following components:   ALT 11 (*)    Bilirubin, Direct <0.1 (*)    All other components within normal limits  BRAIN NATRIURETIC PEPTIDE - Abnormal; Notable for the following components:   B Natriuretic Peptide 161.0 (*)    All other components within normal limits  INFLUENZA PANEL BY PCR (TYPE A & B)  LIPASE, BLOOD  TROPONIN I   ____________________________________________  EKG  ED ECG REPORT I, Hinda Kehr, the attending physician, personally viewed and interpreted this ECG.  Date: 10/22/2017 EKG  Time: 10:44 AM Rate: 93 Rhythm: normal sinus rhythm QRS Axis: normal Intervals: normal with occasional PVC and some motion artifact ST/T Wave abnormalities: Non-specific ST segment / T-wave changes, but no evidence of acute ischemia. Narrative Interpretation: no evidence of acute ischemia   ____________________________________________  RADIOLOGY  I, Hinda Kehr, personally viewed and evaluated these images (plain radiographs) as part of my medical decision making, as well as reviewing the written report by the radiologist.  ED MD interpretation: No acute infiltrate that I can appreciate nor reported on the radiology report.  Official radiology report(s): Dg Chest 2 View  Result Date: 10/22/2017 CLINICAL DATA:  Increased shortness of breath for the past 2 days associated with productive cough. History of CHF, previous MI, COPD, asthma, current smoker. EXAM: CHEST  2 VIEW COMPARISON:  Chest x-ray of December 07, 2016 FINDINGS: The lungs are mildly hyperinflated with hemidiaphragm flattening. The interstitial markings are increased diffusely but stable. There is no alveolar infiltrate or pleural effusion. The heart and pulmonary vascularity are normal. The patient has undergone previous CABG. There is calcification in the wall of the aortic arch. The patient has undergone kyphoplasty in mid and lower thoracic vertebral levels. IMPRESSION: COPD-smoking related changes, stable. No acute pneumonia. Top-normal cardiac size without pulmonary vascular congestion or pulmonary edema. Previous CABG. Thoracic aortic atherosclerosis. Electronically Signed   By: David  [place] M.D.   On: 10/22/2017 13:46    ____________________________________________   PROCEDURES  Critical Care performed: No   Procedure(s) performed:   Procedures   ____________________________________________   INITIAL IMPRESSION / ASSESSMENT AND PLAN / ED COURSE  As part of my medical decision making, I reviewed the following  data within the [place] notes reviewed and incorporated, Labs reviewed , EKG interpreted , Old chart reviewed and Radiograph reviewed     Differential includes, but is not limited to, viral syndrome, bronchitis including COPD exacerbation, pneumonia, reactive airway disease including asthma, CHF including exacerbation with or without pulmonary/interstitial edema, pneumothorax, ACS, thoracic trauma, and pulmonary embolism.  This patient's presentation to me seems most consistent with COPD exacerbation although I am evaluating broadly for any evidence of pneumonia or CHF (such as pulmonary edema on chest x-ray).  Given the prevalence in the community I am also checking influenza PCR.  I will treat for COPD with steroids and breathing treatments and reassess when her workup is complete.  Clinical Course as of Oct 22 1501  Tue Oct 22, 2017  1357 Influenza A By PCR: NEGATIVE [CF]  1357 No acute infiltrates  DG Chest 2 View [CF]  1502 The patient feels much better after breathing treatments and is not in any respiratory distress upon reexamination.  Lungs still have some expiratory wheezing but are more clear than prior.  Vital signs remained stable.  Influenza was negative and no acute infiltrates on chest x-ray, questionable vascular congestion but no history of CHF and no pulmonary edema.  I discussed with the patient and her daughter the plan for continuing to use her home medications and prescription for prednisone and they both understand and agree with the plan.  At the daughter's request I called in the prescription to [facility] but I also provided a paper copy in case something happens with the call, although I did speak directly with a representative rather than just leaving a message.  I gave my usual and customary return precautions.   [CF]    Clinical Course User Index [CF] Hinda Kehr, MD    ____________________________________________  FINAL  CLINICAL IMPRESSION(S) / ED DIAGNOSES  Final diagnoses:  COPD exacerbation ([place])     MEDICATIONS GIVEN DURING THIS VISIT:  Medications  methylPREDNISolone sodium succinate (SOLU-MEDROL) 125 mg/2 mL injection 125 mg (125 mg Intravenous Given 10/22/17 1311)  ipratropium-albuterol (DUONEB)  0.5-2.5 (3) MG/3ML nebulizer solution 3 mL (3 mLs Nebulization Given 10/22/17 1229)  ipratropium-albuterol (DUONEB) 0.5-2.5 (3) MG/3ML nebulizer solution 3 mL (3 mLs Nebulization Given 10/22/17 1229)  ipratropium-albuterol (DUONEB) 0.5-2.5 (3) MG/3ML nebulizer solution 3 mL (3 mLs Nebulization Given 10/22/17 1229)     ED Discharge Orders        Ordered    predniSONE (DELTASONE) 20 MG tablet  Daily     10/22/17 1457       Note:  This document was prepared using Dragon voice recognition software and may include unintentional dictation errors.    Hinda Kehr, MD 10/22/17 [phone]

## 2017-10-22 NOTE — ED Notes (Signed)
First Nurse Note:  Patient on home O2 at 2L usually, currently on 3L states she feels short of breath.  Pulse ox 94% on 3L.  RR - 24.  Pulled for next triage.

## 2017-12-13 NOTE — Progress Notes (Deleted)
[facility]     Assessment and Plan:  COPD with exacerbation.  -Appears to be recovering, but not yet back to baseline. -Continue current inhaled medications.  Nicotine abuse.  -She has not been smoking as she is currently in a rehabilitation center. -She is committed to quitting smoking, however, and does not want to restart when she goes home. We discussed the importance of not restarting smoking when she goes home. Greater than 3 minutes spent in smoking cessation discussion.  Dyspnea with chronic hypoxic respiratory failure.  -Continue on oxygen at 2 L.  Chronic systolic congestive heart failure. -Most recent echocardiogram showed ejection fraction equals 45%. -Likely contributing to dyspnea.  Date: 12/13/2017  MRN# [id number] Debra [name] 09-26-1932   Debra [name] is a 84 y.o. old female seen in consultation for chief complaint of:    No chief complaint on file.   HPI:  The patient is an 84 year old female smoker with a history of oxygen dependent COPD, she was last seen in this office in October 2017.  Since that time she was admitted to the hospital in January 2018 for COPD exacerbation, and again in March 2018 for CHF exacerbation.   Echocardiogram 06/10/16: Severe LVH, EF equals 45%   Social Hx:   Social History   Tobacco Use  . Smoking status: Current Every Day Smoker    Packs/day: 0.50    Years: 55.00    Pack years: 27.50  . Smokeless tobacco: Never Used  Substance Use Topics  . Alcohol use: No  . Drug use: No   Medication:   Reviewed    Allergies:  Codeine  Review of Systems: Gen:  Denies  fever, sweats, chills HEENT: Denies blurred vision, double vision. bleeds, sore throat Cvc:  No dizziness, chest pain. Resp:   Denies cough or sputum production, shortness of breath Gi: Denies swallowing difficulty, stomach pain. Gu:  Denies bladder incontinence, burning urine Ext:   No Joint pain, stiffness. Skin: No skin rash,   hives  Endoc:  No polyuria, polydipsia. Psych: No depression, insomnia. Other:  All other systems were reviewed with the patient and were negative other that what is mentioned in the HPI.   Physical Examination:   VS: There were no vitals taken for this visit.  General Appearance: No distress  Neuro:without focal findings,  speech normal,  HEENT: PERRLA, EOM intact.   Pulmonary: normal breath sounds, No wheezing.  CardiovascularNormal S1,S2.  No m/r/g.   Abdomen: Benign, Soft, non-tender. Renal:  No costovertebral tenderness  GU:  No performed at this time. Endoc: No evident thyromegaly, no signs of acromegaly. Skin:   warm, no rashes, no ecchymosis  Extremities: normal, no cyanosis, clubbing.  Other findings:    LABORATORY PANEL:   CBC No results for input(s): WBC, HGB, HCT, PLT in the last 168 hours. ------------------------------------------------------------------------------------------------------------------  Chemistries  No results for input(s): NA, K, CL, CO2, GLUCOSE, BUN, CREATININE, CALCIUM, MG, AST, ALT, ALKPHOS, BILITOT in the last 168 hours.  Invalid input(s): GFRCGP ------------------------------------------------------------------------------------------------------------------  Cardiac Enzymes No results for input(s): TROPONINI in the last 168 hours. ------------------------------------------------------------  RADIOLOGY:  No results found.     Thank  you for the consultation and for allowing [facility] to assist in the care of your patient. Our recommendations are noted above.  Please contact us if we can be of further service.   Marda Stalker, MD.  Board Certified in Internal Medicine, Pulmonary Medicine, [place], and Sleep Medicine.  [facility] Office Number: [phone]  Patricia Pesa, M.D.  Vilinda Boehringer, M.D.  Merton Border, M.D  12/13/2017

## 2017-12-16 ENCOUNTER — Emergency Department: Payer: Medicare Other

## 2017-12-16 ENCOUNTER — Emergency Department
Admission: EM | Admit: 2017-12-16 | Discharge: 2017-12-16 | Disposition: A | Discharge status: Home / Self Care | Payer: Medicare Other | Attending: Student in an Organized Health Care Education/Training Program | Admitting: Student in an Organized Health Care Education/Training Program

## 2017-12-16 DIAGNOSIS — Z7901 Long term (current) use of anticoagulants: Secondary | ICD-10-CM | POA: Insufficient documentation

## 2017-12-16 DIAGNOSIS — Z79899 Other long term (current) drug therapy: Secondary | ICD-10-CM | POA: Insufficient documentation

## 2017-12-16 DIAGNOSIS — J441 Chronic obstructive pulmonary disease with (acute) exacerbation: Secondary | ICD-10-CM | POA: Insufficient documentation

## 2017-12-16 DIAGNOSIS — F17210 Nicotine dependence, cigarettes, uncomplicated: Secondary | ICD-10-CM | POA: Insufficient documentation

## 2017-12-16 DIAGNOSIS — I252 Old myocardial infarction: Secondary | ICD-10-CM | POA: Diagnosis not present

## 2017-12-16 DIAGNOSIS — I5022 Chronic systolic (congestive) heart failure: Secondary | ICD-10-CM | POA: Insufficient documentation

## 2017-12-16 DIAGNOSIS — R0602 Shortness of breath: Secondary | ICD-10-CM | POA: Diagnosis present

## 2017-12-16 LAB — BLOOD GAS, VENOUS
Acid-Base Excess: 6 mmol/L — ABNORMAL HIGH (ref 0.0–2.0)
Bicarbonate: 33.1 mmol/L — ABNORMAL HIGH (ref 20.0–28.0)
O2 Saturation: 74.6 %
Patient temperature: 37
pCO2, Ven: 60 mmHg (ref 44.0–60.0)
pH, Ven: 7.35 (ref 7.250–7.430)
pO2, Ven: 42 mmHg (ref 32.0–45.0)

## 2017-12-16 LAB — BASIC METABOLIC PANEL
Anion gap: 5 (ref 5–15)
BUN: 18 mg/dL (ref 6–20)
CALCIUM: 8.8 mg/dL — AB (ref 8.9–10.3)
CO2: 35 mmol/L — AB (ref 22–32)
CREATININE: 1.05 mg/dL — AB (ref 0.44–1.00)
Chloride: 97 mmol/L — ABNORMAL LOW (ref 101–111)
GFR calc non Af Amer: 48 mL/min — ABNORMAL LOW (ref >60)
GFR, EST AFRICAN AMERICAN: 55 mL/min — AB (ref >60)
Glucose, Bld: 137 mg/dL — ABNORMAL HIGH (ref 65–99)
Potassium: 4.2 mmol/L (ref 3.5–5.1)
SODIUM: 137 mmol/L (ref 135–145)

## 2017-12-16 LAB — CBC WITH DIFFERENTIAL/PLATELET
BASOS PCT: 1 %
Basophils Absolute: 0 10*3/uL (ref 0–0.1)
EOS ABS: 0.6 10*3/uL (ref 0–0.7)
EOS PCT: 9 %
HCT: 34.8 % — ABNORMAL LOW (ref 35.0–47.0)
Hemoglobin: 11.5 g/dL — ABNORMAL LOW (ref 12.0–16.0)
Lymphocytes Relative: 22 %
Lymphs Abs: 1.4 10*3/uL (ref 1.0–3.6)
MCH: 33.7 pg (ref 26.0–34.0)
MCHC: 33.1 g/dL (ref 32.0–36.0)
MCV: 101.8 fL — ABNORMAL HIGH (ref 80.0–100.0)
MONOS PCT: 11 %
Monocytes Absolute: 0.7 10*3/uL (ref 0.2–0.9)
Neutro Abs: 3.7 10*3/uL (ref 1.4–6.5)
Neutrophils Relative %: 57 %
Platelets: 259 10*3/uL (ref 150–440)
RBC: 3.41 MIL/uL — ABNORMAL LOW (ref 3.80–5.20)
RDW: 14.3 % (ref 11.5–14.5)
WBC: 6.4 10*3/uL (ref 3.6–11.0)

## 2017-12-16 LAB — TROPONIN I: Troponin I: <0.03 ng/mL (ref <0.03)

## 2017-12-16 MED ORDER — IPRATROPIUM-ALBUTEROL 0.5-2.5 (3) MG/3ML IN SOLN
3.0000 mL | Freq: Once | RESPIRATORY_TRACT | Status: AC
Start: 2017-12-16 — End: 2017-12-16
  Administered 2017-12-16: 3 mL via RESPIRATORY_TRACT

## 2017-12-16 MED ORDER — IPRATROPIUM-ALBUTEROL 0.5-2.5 (3) MG/3ML IN SOLN: 3.0000 mL | RESPIRATORY_TRACT | 0 refills | Status: DC | PRN

## 2017-12-16 MED ORDER — IPRATROPIUM-ALBUTEROL 0.5-2.5 (3) MG/3ML IN SOLN
RESPIRATORY_TRACT | Status: AC
  Filled 2017-12-16: qty 9

## 2017-12-16 MED ORDER — ALBUTEROL SULFATE (2.5 MG/3ML) 0.083% IN NEBU
2.5000 mg | INHALATION_SOLUTION | Freq: Once | RESPIRATORY_TRACT | Status: AC
  Administered 2017-12-16: 2.5 mg via RESPIRATORY_TRACT
  Filled 2017-12-16: qty 3

## 2017-12-16 MED ORDER — IPRATROPIUM-ALBUTEROL 0.5-2.5 (3) MG/3ML IN SOLN
3.0000 mL | Freq: Once | RESPIRATORY_TRACT | Status: AC
  Administered 2017-12-16: 3 mL via RESPIRATORY_TRACT
  Filled 2017-12-16: qty 6

## 2017-12-16 MED ORDER — PREDNISONE 10 MG PO TABS: 10.0000 mg | ORAL_TABLET | Freq: Every day | ORAL | 0 refills | Status: DC

## 2017-12-16 NOTE — ED Provider Notes (Signed)
[facility] Emergency Department Provider Note    First MD Initiated Contact with Patient 12/16/17 1123     (approximate)  I have reviewed the triage vital signs and the nursing notes.   HISTORY  Chief Complaint Shortness of Breath    HPI Debra [name] is a 84 y.o. female with a history of asthma, COPD as well as CHF also history of triple bypass presents with sudden onset shortness of breath.  States that she is not had a cigarette in over 3 weeks.  No fevers at home.  Denies any chest pain.  Her primary complaint today is simply shortness of breath.  She called EMS because she did not feel like she was getting any improvement from her home nebulizers.  She was given 2 nebulizer treatments by EMS in route to the ER with significant improvement.  She was found to be 86% on her 2 L of home O2.  She is allowed to wear up to 3 L as needed.    Past Medical History:  Diagnosis Date  . Asthma   . CHF (congestive heart failure) ([place])   . COPD (chronic obstructive pulmonary disease) ([place])   . Myocardial infarction [facility])    Family History  Problem Relation Age of Onset  . CAD Father    Past Surgical History:  Procedure Laterality Date  . ABDOMINAL HYSTERECTOMY    . BREAST SURGERY    . BYPASS GRAFT  2007   triple  . CHOLECYSTECTOMY    . CORONARY ARTERY BYPASS GRAFT    . TONSILLECTOMY     Patient Active Problem List   Diagnosis Date Noted  . CHF (congestive heart failure) ([place]) 12/05/2016  . CAP (community acquired pneumonia) 10/05/2016  . Chronic systolic heart failure ([place]) 06/28/2016  . COPD (chronic obstructive pulmonary disease) with emphysema ([place]) 06/28/2016  . Tobacco use 06/28/2016  . Acidosis 09/14/2015  . Hypotension 09/14/2015  . Hypokalemia 09/14/2015  . COPD exacerbation ([place]) 09/01/2015  . Sepsis ([place]) 08/27/2015      Prior to Admission medications   Medication Sig Start Date End Date Taking? Authorizing Provider  acetaminophen  (TYLENOL) 325 MG tablet Take 2 tablets (650 mg total) by mouth every 6 (six) hours as needed for mild pain (or Fever >/= 101). Patient not taking: Reported on 10/22/2017 10/10/16   Nicholes Mango, MD  albuterol (PROVENTIL HFA;VENTOLIN HFA) 108 (90 Base) MCG/ACT inhaler Inhale 2 puffs into the lungs every 6 (six) hours as needed for wheezing or shortness of breath.    [provider]  albuterol (PROVENTIL) (2.5 MG/3ML) 0.083% nebulizer solution Take 2.5 mg by nebulization 4 (four) times daily as needed for wheezing or shortness of breath.    [provider]  ALPRAZolam Duanne Moron) 0.5 MG tablet Take 1 tablet (0.5 mg total) by mouth 2 (two) times daily as needed. 12/08/16   Bettey Costa, MD  aspirin 81 MG chewable tablet Chew by mouth every morning.    [provider]  atorvastatin (LIPITOR) 10 MG tablet Take 10 mg by mouth every evening. 05/16/16   [provider]  benzonatate (TESSALON) 100 MG capsule Take 100 mg by mouth 3 (three) times daily as needed for cough.     [provider]  bisacodyl (DULCOLAX) 10 MG suppository Place 0.5 suppositories (5 mg total) rectally daily as needed for moderate constipation. Patient not taking: Reported on 10/22/2017 10/10/16   Nicholes Mango, MD  Calcium Carbonate-Vitamin D (OYSTERCAL 500 + D PO)  Take 1 tablet by mouth 2 (two) times daily.    [provider]  celecoxib (CELEBREX) 200 MG capsule Take 200 mg by mouth 2 (two) times daily. 09/09/17   [provider]  clopidogrel (PLAVIX) 75 MG tablet Take 75 mg by mouth every morning.     [provider]  docusate sodium (COLACE) 50 MG capsule Take 50 mg by mouth daily as needed.     [provider]  feeding supplement, ENSURE ENLIVE, (ENSURE ENLIVE) LIQD Take 237 mLs by mouth 2 (two) times daily between meals. 12/08/16   Bettey Costa, MD  fexofenadine (ALLEGRA) 180 MG tablet Take 180 mg by mouth daily as needed for allergies.     [provider]    FLUoxetine (PROZAC) 20 MG capsule Take 1 capsule (20 mg total) by mouth daily. 09/01/15   Aldean Jewett, MD  furosemide (LASIX) 40 MG tablet Take 1 tablet (40 mg total) by mouth daily. Start on TUesday 4/3 12/08/16   Bettey Costa, MD  guaiFENesin-dextromethorphan (ROBITUSSIN DM) 100-10 MG/5ML syrup Take 10 mLs by mouth every 6 (six) hours as needed for cough. Patient not taking: Reported on 10/22/2017 10/10/16   Nicholes Mango, MD  hydrALAZINE (APRESOLINE) 25 MG tablet Take 1 tablet (25 mg total) by mouth every 8 (eight) hours. 10/10/16   Nicholes Mango, MD  HYDROcodone-acetaminophen (NORCO/VICODIN) 5-325 MG tablet Take 1 tablet by mouth every 6 (six) hours as needed for moderate pain. 10/10/16   Gouru, Illene Silver, MD  ipratropium-albuterol (DUONEB) 0.5-2.5 (3) MG/3ML SOLN Take 3 mLs by nebulization every 4 (four) hours as needed. 10/10/16   Gouru, Illene Silver, MD  latanoprost (XALATAN) 0.005 % ophthalmic solution Place 1 drop into both eyes at bedtime. 05/10/16   [provider]  losartan (COZAAR) 25 MG tablet Take 25 mg by mouth daily.    [provider]  metoprolol (LOPRESSOR) 25 MG tablet Take 1 tablet (25 mg total) by mouth 2 (two) times daily. 10/10/16   Gouru, Illene Silver, MD  mometasone-formoterol (DULERA) 100-5 MCG/ACT AERO Inhale 2 puffs into the lungs 2 (two) times daily. Patient not taking: Reported on 10/22/2017 09/01/15   Aldean Jewett, MD  pantoprazole (PROTONIX) 40 MG tablet Take 40 mg by mouth every morning.     [provider]  predniSONE (DELTASONE) 20 MG tablet Take 3 tablets (60 mg total) by mouth daily. 10/22/17   Hinda Kehr, MD  saccharomyces boulardii (FLORASTOR) 250 MG capsule Take 1 capsule (250 mg total) by mouth 2 (two) times daily. Patient not taking: Reported on 10/22/2017 10/10/16   Nicholes Mango, MD  tiotropium (SPIRIVA HANDIHALER) 18 MCG inhalation capsule Place 1 capsule (18 mcg total) into inhaler and inhale every morning. 09/01/15   Aldean Jewett, MD   tiZANidine (ZANAFLEX) 2 MG tablet Take 2 mg by mouth 3 (three) times daily as needed for muscle spasms.     [provider]  traZODone (DESYREL) 50 MG tablet Take 50 mg by mouth at bedtime.     [provider]    Allergies Codeine    Social History Social History   Tobacco Use  . Smoking status: Current Every Day Smoker    Packs/day: 0.50    Years: 55.00    Pack years: 27.50  . Smokeless tobacco: Never Used  Substance Use Topics  . Alcohol use: No  . Drug use: No    Review of Systems Patient denies headaches, rhinorrhea, blurry vision, numbness, shortness of breath, chest pain, edema, cough,  abdominal pain, nausea, vomiting, diarrhea, dysuria, fevers, rashes or hallucinations unless otherwise stated above in HPI. ____________________________________________   PHYSICAL EXAM:  VITAL SIGNS: Vitals:   12/16/17 1120 12/16/17 1121  BP:  (!) 119/100  Pulse:  90  Temp:  97.9 F (36.6 C)  SpO2: 95% 95%    Constitutional: Alert and oriented. in moderate resp distress Eyes: Conjunctivae are normal.  Head: Atraumatic. Nose: No congestion/rhinnorhea. Mouth/Throat: Mucous membranes are moist.   Neck: No stridor. Painless ROM.  Cardiovascular: Normal rate, regular rhythm. Grossly normal heart sounds.  Good peripheral circulation. Respiratory: tachypnea with use of accessory muscles. Prolonged expiratory phase, diffuse wheeze, R>L Gastrointestinal: Soft and nontender. No distention. No abdominal bruits. No CVA tenderness. Genitourinary:  Musculoskeletal: No lower extremity tenderness nor edema.  No joint effusions. Neurologic:  Normal speech and language. No gross focal neurologic deficits are appreciated. No facial droop Skin:  Skin is warm, dry and intact. No rash noted. Psychiatric: Mood and affect are normal. Speech and behavior are normal.  ____________________________________________   LABS (all labs ordered are listed, but only abnormal results are  displayed)  No results found for this or any previous visit (from the past 24 hour(s)). ____________________________________________  EKG My review and personal interpretation at Time: 11:32   Indication: sob  Rate: 90  Rhythm: sinus Axis: normal Other: normal intervals, no stemi ____________________________________________  RADIOLOGY  I personally reviewed all radiographic images ordered to evaluate for the above acute complaints and reviewed radiology reports and findings.  These findings were personally discussed with the patient.  Please see medical record for radiology report.  ____________________________________________   PROCEDURES  Procedure(s) performed:  Procedures    Critical Care performed: no ____________________________________________   INITIAL IMPRESSION / ASSESSMENT AND PLAN / ED COURSE  Pertinent labs & imaging results that were available during my care of the patient were reviewed by me and considered in my medical decision making (see chart for details).  DDX: Asthma, copd, CHF, pna, ptx, malignancy, Pe, anemia   CLYDELL ALBERTS is a 84 y.o. who presents to the ED with shortness of breath.  Seems to be primarily consistent with COPD or emphysema.  Will give nebulizers.  Not clinically consistent with PE.  No chest pain to suggest ACS or dissection.  Where chest x-ray and blood work for the above differential.  The patient will be placed on continuous pulse oximetry and telemetry for monitoring.  Laboratory evaluation will be sent to evaluate for the above complaints.     Clinical Course as of Dec 17 1406  Mon Dec 16, 2017  1243 Patient reassessed.  Still with mild to moderate tachypnea.  Will give additional nebulizer and reassess.   [PR]  3532 Discussed therapeutic options with patient she still having some shortness of breath but does feel significantly improved after her DuoNeb.  Her daughter at bedside and states that this is how she has been  looking for the past several weeks and they were discussing need for placement rehab but had not talked with her primary physician about this yet.  Again she denies any chest pain.  We will check blood gas to ensure that elevated CO2 is not secondary to significantly elevated PCO2 which could possibly explain her weakness.   [PR]  1407 PCO2 is stable.  Patient in no acute distress.  At this point do believe patient stable and appropriate for discharge home.  Have discussed with the patient and available family all diagnostics and treatments performed thus  far and all questions were answered to the best of my ability. The patient demonstrates understanding and agreement with plan.    [PR]    Clinical Course User Index [PR] Merlyn Lot, MD     As part of my medical decision making, I reviewed the following data within the [facility] notes reviewed and incorporated, Labs reviewed, notes from prior ED visits and [place] Controlled Substance Database   ____________________________________________   FINAL CLINICAL IMPRESSION(S) / ED DIAGNOSES  Final diagnoses:  COPD exacerbation ([place])      NEW MEDICATIONS STARTED DURING THIS VISIT:  New Prescriptions   No medications on file     Note:  This document was prepared using Dragon voice recognition software and may include unintentional dictation errors.    Merlyn Lot, MD 12/16/17 [phone]

## 2017-12-16 NOTE — ED Notes (Signed)
    Pt was ambulatory pulse oximeter when sitting still 90 @resting , pt O2 dropped when ambulating to 83. EDP aware. Pt states this is what happened and PCP ordered to be on 3 L when ambulating. Once placed on 3L pt maintain o2 sat of 90-91 when walking.

## 2017-12-16 NOTE — ED Triage Notes (Signed)
Pt presents today from home via [facility] for [facility]. Pt is A/ox4, pt is labored breathening and cough I productive with light green sputum.

## 2017-12-18 ENCOUNTER — Ambulatory Visit: Payer: Medicare Other | Admitting: Internal Medicine

## 2017-12-22 ENCOUNTER — Encounter: Payer: Self-pay | Admitting: Emergency Medicine

## 2017-12-22 ENCOUNTER — Inpatient Hospital Stay
Admission: EM | Admit: 2017-12-22 | Discharge: 2017-12-28 | DRG: 190 | Disposition: A | Discharge status: Skilled Nursing Facility | Payer: Medicare Other | Attending: Internal Medicine | Admitting: Internal Medicine

## 2017-12-22 ENCOUNTER — Emergency Department: Payer: Medicare Other

## 2017-12-22 ENCOUNTER — Other Ambulatory Visit: Payer: Self-pay

## 2017-12-22 DIAGNOSIS — J9621 Acute and chronic respiratory failure with hypoxia: Secondary | ICD-10-CM | POA: Diagnosis present

## 2017-12-22 DIAGNOSIS — Z66 Do not resuscitate: Secondary | ICD-10-CM | POA: Diagnosis present

## 2017-12-22 DIAGNOSIS — Z951 Presence of aortocoronary bypass graft: Secondary | ICD-10-CM

## 2017-12-22 DIAGNOSIS — F451 Undifferentiated somatoform disorder: Secondary | ICD-10-CM

## 2017-12-22 DIAGNOSIS — R402413 Glasgow coma scale score 13-15, at hospital admission: Secondary | ICD-10-CM | POA: Diagnosis present

## 2017-12-22 DIAGNOSIS — Z7951 Long term (current) use of inhaled steroids: Secondary | ICD-10-CM | POA: Diagnosis not present

## 2017-12-22 DIAGNOSIS — D72829 Elevated white blood cell count, unspecified: Secondary | ICD-10-CM | POA: Diagnosis present

## 2017-12-22 DIAGNOSIS — I110 Hypertensive heart disease with heart failure: Secondary | ICD-10-CM | POA: Diagnosis present

## 2017-12-22 DIAGNOSIS — J441 Chronic obstructive pulmonary disease with (acute) exacerbation: Secondary | ICD-10-CM

## 2017-12-22 DIAGNOSIS — F418 Other specified anxiety disorders: Secondary | ICD-10-CM | POA: Diagnosis present

## 2017-12-22 DIAGNOSIS — Z23 Encounter for immunization: Secondary | ICD-10-CM | POA: Diagnosis present

## 2017-12-22 DIAGNOSIS — E871 Hypo-osmolality and hyponatremia: Secondary | ICD-10-CM | POA: Diagnosis present

## 2017-12-22 DIAGNOSIS — Z6379 Other stressful life events affecting family and household: Secondary | ICD-10-CM

## 2017-12-22 DIAGNOSIS — I5032 Chronic diastolic (congestive) heart failure: Secondary | ICD-10-CM | POA: Diagnosis present

## 2017-12-22 DIAGNOSIS — Z7982 Long term (current) use of aspirin: Secondary | ICD-10-CM | POA: Diagnosis not present

## 2017-12-22 DIAGNOSIS — Z885 Allergy status to narcotic agent status: Secondary | ICD-10-CM

## 2017-12-22 DIAGNOSIS — J9601 Acute respiratory failure with hypoxia: Secondary | ICD-10-CM | POA: Diagnosis present

## 2017-12-22 DIAGNOSIS — E875 Hyperkalemia: Secondary | ICD-10-CM | POA: Diagnosis present

## 2017-12-22 DIAGNOSIS — I252 Old myocardial infarction: Secondary | ICD-10-CM | POA: Diagnosis not present

## 2017-12-22 DIAGNOSIS — J209 Acute bronchitis, unspecified: Secondary | ICD-10-CM | POA: Diagnosis present

## 2017-12-22 DIAGNOSIS — Z79899 Other long term (current) drug therapy: Secondary | ICD-10-CM | POA: Diagnosis not present

## 2017-12-22 DIAGNOSIS — E860 Dehydration: Secondary | ICD-10-CM | POA: Diagnosis present

## 2017-12-22 DIAGNOSIS — J9622 Acute and chronic respiratory failure with hypercapnia: Secondary | ICD-10-CM | POA: Diagnosis present

## 2017-12-22 DIAGNOSIS — J811 Chronic pulmonary edema: Secondary | ICD-10-CM | POA: Diagnosis present

## 2017-12-22 DIAGNOSIS — T380X5A Adverse effect of glucocorticoids and synthetic analogues, initial encounter: Secondary | ICD-10-CM | POA: Diagnosis present

## 2017-12-22 DIAGNOSIS — Z87891 Personal history of nicotine dependence: Secondary | ICD-10-CM

## 2017-12-22 DIAGNOSIS — Z7902 Long term (current) use of antithrombotics/antiplatelets: Secondary | ICD-10-CM

## 2017-12-22 DIAGNOSIS — Z8679 Personal history of other diseases of the circulatory system: Secondary | ICD-10-CM

## 2017-12-22 DIAGNOSIS — Z9981 Dependence on supplemental oxygen: Secondary | ICD-10-CM | POA: Diagnosis not present

## 2017-12-22 DIAGNOSIS — F331 Major depressive disorder, recurrent, moderate: Secondary | ICD-10-CM

## 2017-12-22 DIAGNOSIS — J439 Emphysema, unspecified: Secondary | ICD-10-CM | POA: Diagnosis present

## 2017-12-22 DIAGNOSIS — F4322 Adjustment disorder with anxiety: Secondary | ICD-10-CM

## 2017-12-22 DIAGNOSIS — Z791 Long term (current) use of non-steroidal anti-inflammatories (NSAID): Secondary | ICD-10-CM

## 2017-12-22 DIAGNOSIS — Z7952 Long term (current) use of systemic steroids: Secondary | ICD-10-CM

## 2017-12-22 LAB — COMPREHENSIVE METABOLIC PANEL
ALBUMIN: 3.6 g/dL (ref 3.5–5.0)
ALK PHOS: 62 U/L (ref 38–126)
ALT: 12 U/L — ABNORMAL LOW (ref 14–54)
ANION GAP: 8 (ref 5–15)
AST: 23 U/L (ref 15–41)
BUN: 29 mg/dL — ABNORMAL HIGH (ref 6–20)
CALCIUM: 8.2 mg/dL — AB (ref 8.9–10.3)
CHLORIDE: 95 mmol/L — AB (ref 101–111)
CO2: 30 mmol/L (ref 22–32)
Creatinine, Ser: 1.14 mg/dL — ABNORMAL HIGH (ref 0.44–1.00)
GFR calc non Af Amer: 43 mL/min — ABNORMAL LOW (ref >60)
GFR, EST AFRICAN AMERICAN: 50 mL/min — AB (ref >60)
GLUCOSE: 122 mg/dL — AB (ref 65–99)
Potassium: 3.9 mmol/L (ref 3.5–5.1)
SODIUM: 133 mmol/L — AB (ref 135–145)
Total Bilirubin: 0.9 mg/dL (ref 0.3–1.2)
Total Protein: 6.8 g/dL (ref 6.5–8.1)

## 2017-12-22 LAB — BLOOD GAS, VENOUS
PATIENT TEMPERATURE: 37
PH VEN: 7.34 (ref 7.250–7.430)
PO2 VEN: 33 mmHg (ref 32.0–45.0)
pCO2, Ven: 66 mmHg — ABNORMAL HIGH (ref 44.0–60.0)

## 2017-12-22 LAB — CBC WITH DIFFERENTIAL/PLATELET
BASOS ABS: 0.1 10*3/uL (ref 0–0.1)
Basophils Relative: 0 %
EOS PCT: 1 %
Eosinophils Absolute: 0.2 10*3/uL (ref 0–0.7)
HCT: 34 % — ABNORMAL LOW (ref 35.0–47.0)
HEMOGLOBIN: 11.7 g/dL — AB (ref 12.0–16.0)
LYMPHS PCT: 12 %
Lymphs Abs: 2.2 10*3/uL (ref 1.0–3.6)
MCH: 35 pg — ABNORMAL HIGH (ref 26.0–34.0)
MCHC: 34.3 g/dL (ref 32.0–36.0)
MCV: 102 fL — AB (ref 80.0–100.0)
Monocytes Absolute: 1.6 10*3/uL — ABNORMAL HIGH (ref 0.2–0.9)
Monocytes Relative: 8 %
NEUTROS PCT: 79 %
Neutro Abs: 15.2 10*3/uL — ABNORMAL HIGH (ref 1.4–6.5)
PLATELETS: 349 10*3/uL (ref 150–440)
RBC: 3.33 MIL/uL — AB (ref 3.80–5.20)
RDW: 15.1 % — ABNORMAL HIGH (ref 11.5–14.5)
WBC: 19.3 10*3/uL — AB (ref 3.6–11.0)

## 2017-12-22 LAB — TROPONIN I

## 2017-12-22 LAB — BRAIN NATRIURETIC PEPTIDE: B Natriuretic Peptide: 81 pg/mL (ref 0.0–100.0)

## 2017-12-22 LAB — MAGNESIUM: Magnesium: 1.8 mg/dL (ref 1.7–2.4)

## 2017-12-22 MED ORDER — ATORVASTATIN CALCIUM 10 MG PO TABS
10.0000 mg | ORAL_TABLET | Freq: Every evening | ORAL | Status: DC
  Administered 2017-12-22 – 2017-12-28 (×7): 10 mg via ORAL
  Filled 2017-12-22 (×7): qty 1

## 2017-12-22 MED ORDER — LATANOPROST 0.005 % OP SOLN
1.0000 [drp] | Freq: Every day | OPHTHALMIC | Status: DC
  Administered 2017-12-23 – 2017-12-28 (×5): 1 [drp] via OPHTHALMIC
  Filled 2017-12-22 (×2): qty 2.5

## 2017-12-22 MED ORDER — ALBUTEROL SULFATE (2.5 MG/3ML) 0.083% IN NEBU
2.5000 mg | INHALATION_SOLUTION | RESPIRATORY_TRACT | Status: DC | PRN
  Administered 2017-12-23 – 2017-12-28 (×3): 2.5 mg via RESPIRATORY_TRACT
  Filled 2017-12-22 (×3): qty 3

## 2017-12-22 MED ORDER — GUAIFENESIN 100 MG/5ML PO SOLN
5.0000 mL | ORAL | Status: DC | PRN
  Administered 2017-12-22 – 2017-12-25 (×4): 100 mg via ORAL
  Filled 2017-12-22: qty 5
  Filled 2017-12-22: qty 10
  Filled 2017-12-22 (×3): qty 5

## 2017-12-22 MED ORDER — TIOTROPIUM BROMIDE MONOHYDRATE 18 MCG IN CAPS
18.0000 ug | ORAL_CAPSULE | Freq: Every morning | RESPIRATORY_TRACT | Status: DC
  Administered 2017-12-23 – 2017-12-28 (×6): 18 ug via RESPIRATORY_TRACT
  Filled 2017-12-22 (×2): qty 5

## 2017-12-22 MED ORDER — ENOXAPARIN SODIUM 40 MG/0.4ML ~~LOC~~ SOLN: 40.0000 mg | SUBCUTANEOUS | Status: DC

## 2017-12-22 MED ORDER — TRAZODONE HCL 50 MG PO TABS
50.0000 mg | ORAL_TABLET | Freq: Every day | ORAL | Status: DC
  Administered 2017-12-22 – 2017-12-28 (×6): 50 mg via ORAL
  Filled 2017-12-22 (×6): qty 1

## 2017-12-22 MED ORDER — SENNOSIDES-DOCUSATE SODIUM 8.6-50 MG PO TABS: 1.0000 | ORAL_TABLET | Freq: Every evening | ORAL | Status: DC | PRN

## 2017-12-22 MED ORDER — ALPRAZOLAM 0.5 MG PO TABS: 0.5000 mg | ORAL_TABLET | Freq: Two times a day (BID) | ORAL | Status: DC | PRN

## 2017-12-22 MED ORDER — FLUOXETINE HCL 20 MG PO CAPS
20.0000 mg | ORAL_CAPSULE | Freq: Every day | ORAL | Status: DC
  Administered 2017-12-23 – 2017-12-25 (×3): 20 mg via ORAL
  Filled 2017-12-22 (×3): qty 1

## 2017-12-22 MED ORDER — TIZANIDINE HCL 2 MG PO TABS
2.0000 mg | ORAL_TABLET | Freq: Three times a day (TID) | ORAL | Status: DC | PRN
  Administered 2017-12-23 – 2017-12-24 (×2): 2 mg via ORAL
  Filled 2017-12-22 (×4): qty 1

## 2017-12-22 MED ORDER — SODIUM CHLORIDE 0.9 % IV SOLN
500.0000 mg | Freq: Once | INTRAVENOUS | Status: AC
  Administered 2017-12-22: 500 mg via INTRAVENOUS
  Filled 2017-12-22: qty 500

## 2017-12-22 MED ORDER — MAGNESIUM SULFATE 2 GM/50ML IV SOLN
2.0000 g | Freq: Once | INTRAVENOUS | Status: AC
  Administered 2017-12-22: 2 g via INTRAVENOUS
  Filled 2017-12-22: qty 50

## 2017-12-22 MED ORDER — PANTOPRAZOLE SODIUM 40 MG PO TBEC
40.0000 mg | DELAYED_RELEASE_TABLET | ORAL | Status: DC
  Administered 2017-12-23 – 2017-12-28 (×6): 40 mg via ORAL
  Filled 2017-12-22 (×6): qty 1

## 2017-12-22 MED ORDER — BENZONATATE 100 MG PO CAPS
100.0000 mg | ORAL_CAPSULE | Freq: Three times a day (TID) | ORAL | Status: DC | PRN
  Administered 2017-12-27: 100 mg via ORAL
  Filled 2017-12-22: qty 1

## 2017-12-22 MED ORDER — IPRATROPIUM-ALBUTEROL 0.5-2.5 (3) MG/3ML IN SOLN
3.0000 mL | Freq: Once | RESPIRATORY_TRACT | Status: AC
  Administered 2017-12-22: 3 mL via RESPIRATORY_TRACT

## 2017-12-22 MED ORDER — SODIUM CHLORIDE 0.9 % IV SOLN
INTRAVENOUS | Status: DC
  Administered 2017-12-22 – 2017-12-25 (×4): via INTRAVENOUS

## 2017-12-22 MED ORDER — SODIUM CHLORIDE 0.9 % IV BOLUS
500.0000 mL | Freq: Once | INTRAVENOUS | Status: AC
  Administered 2017-12-22: 500 mL via INTRAVENOUS

## 2017-12-22 MED ORDER — BISACODYL 5 MG PO TBEC: 5.0000 mg | DELAYED_RELEASE_TABLET | Freq: Every day | ORAL | Status: DC | PRN

## 2017-12-22 MED ORDER — IPRATROPIUM-ALBUTEROL 0.5-2.5 (3) MG/3ML IN SOLN
3.0000 mL | Freq: Once | RESPIRATORY_TRACT | Status: AC
  Administered 2017-12-22: 3 mL via RESPIRATORY_TRACT
  Filled 2017-12-22: qty 9

## 2017-12-22 MED ORDER — CLOPIDOGREL BISULFATE 75 MG PO TABS
75.0000 mg | ORAL_TABLET | ORAL | Status: DC
  Administered 2017-12-23 – 2017-12-28 (×6): 75 mg via ORAL
  Filled 2017-12-22 (×7): qty 1

## 2017-12-22 MED ORDER — DEXTROSE 5 % IV SOLN
250.0000 mg | INTRAVENOUS | Status: DC
  Administered 2017-12-23: 250 mg via INTRAVENOUS
  Filled 2017-12-22 (×2): qty 250

## 2017-12-22 MED ORDER — ONDANSETRON HCL 4 MG/2ML IJ SOLN
4.0000 mg | Freq: Four times a day (QID) | INTRAMUSCULAR | Status: DC | PRN
  Administered 2017-12-24 – 2017-12-26 (×2): 4 mg via INTRAVENOUS
  Filled 2017-12-22 (×2): qty 2

## 2017-12-22 MED ORDER — ACETAMINOPHEN 325 MG PO TABS
650.0000 mg | ORAL_TABLET | Freq: Four times a day (QID) | ORAL | Status: DC | PRN
  Administered 2017-12-22 – 2017-12-23 (×2): 650 mg via ORAL
  Filled 2017-12-22 (×2): qty 2

## 2017-12-22 MED ORDER — ACETAMINOPHEN 650 MG RE SUPP: 650.0000 mg | Freq: Four times a day (QID) | RECTAL | Status: DC | PRN

## 2017-12-22 MED ORDER — ONDANSETRON HCL 4 MG PO TABS
4.0000 mg | ORAL_TABLET | Freq: Four times a day (QID) | ORAL | Status: DC | PRN
  Administered 2017-12-28: 4 mg via ORAL
  Filled 2017-12-22: qty 1

## 2017-12-22 MED ORDER — ASPIRIN 81 MG PO CHEW
81.0000 mg | CHEWABLE_TABLET | ORAL | Status: DC
  Administered 2017-12-23 – 2017-12-28 (×6): 81 mg via ORAL
  Filled 2017-12-22 (×7): qty 1

## 2017-12-22 MED ORDER — ALBUTEROL SULFATE (2.5 MG/3ML) 0.083% IN NEBU
2.5000 mg | INHALATION_SOLUTION | Freq: Four times a day (QID) | RESPIRATORY_TRACT | Status: DC
  Administered 2017-12-22 – 2017-12-23 (×3): 2.5 mg via RESPIRATORY_TRACT
  Filled 2017-12-22 (×3): qty 3

## 2017-12-22 MED ORDER — METHYLPREDNISOLONE SODIUM SUCC 125 MG IJ SOLR
60.0000 mg | Freq: Four times a day (QID) | INTRAMUSCULAR | Status: DC
  Administered 2017-12-22 – 2017-12-23 (×4): 60 mg via INTRAVENOUS
  Filled 2017-12-22 (×4): qty 2

## 2017-12-22 MED ORDER — ENOXAPARIN SODIUM 40 MG/0.4ML ~~LOC~~ SOLN
40.0000 mg | SUBCUTANEOUS | Status: DC
  Administered 2017-12-22 – 2017-12-28 (×6): 40 mg via SUBCUTANEOUS
  Filled 2017-12-22 (×6): qty 0.4

## 2017-12-22 NOTE — Progress Notes (Signed)
Patient ate 75 % of supper on 3 L of 02  ( home amount)  And is now asleep, resting well, sats 94 %  And maintaining with no acute respiratory increased effort. Bipap on standby at this time

## 2017-12-22 NOTE — Plan of Care (Signed)
Pt just admitted from ER, hx of chronic COPD , on home 02, given nebs, solumedrol and bipap, now states feels better and wants food and drink

## 2017-12-22 NOTE — ED Provider Notes (Signed)
[facility] Emergency Department Provider Note  ____________________________________________   First MD Initiated Contact with Patient 12/22/17 1245     (approximate)  I have reviewed the triage vital signs and the nursing notes.   HISTORY  Chief Complaint No chief complaint on file.  Level 5 exemption history limited by the patient's clinical condition  HPI Debra [name] is a 84 y.o. female who comes to the emergency department via EMS with shortness of breath.  She has a complex past medical history including congestive heart failure and COPD.  She was most recently seen in our emergency department 6 days ago for a COPD exacerbation and initially did well until last night.  EMS gave her 125 mg of Solu-Medrol and DuoNeb in route.  The patient arrives quite short of breath and unable to provide any other further history at this time.  Past Medical History:  Diagnosis Date  . Asthma   . CHF (congestive heart failure) ([place])   . COPD (chronic obstructive pulmonary disease) ([place])   . Myocardial infarction [facility])     Patient Active Problem List   Diagnosis Date Noted  . CHF (congestive heart failure) ([place]) 12/05/2016  . CAP (community acquired pneumonia) 10/05/2016  . Chronic systolic heart failure ([place]) 06/28/2016  . COPD (chronic obstructive pulmonary disease) with emphysema ([place]) 06/28/2016  . Tobacco use 06/28/2016  . Acidosis 09/14/2015  . Hypotension 09/14/2015  . Hypokalemia 09/14/2015  . COPD exacerbation ([place]) 09/01/2015  . Sepsis ([place]) 08/27/2015    Past Surgical History:  Procedure Laterality Date  . ABDOMINAL HYSTERECTOMY    . BREAST SURGERY    . BYPASS GRAFT  2007   triple  . CHOLECYSTECTOMY    . CORONARY ARTERY BYPASS GRAFT    . TONSILLECTOMY      Prior to Admission medications   Medication Sig Start Date End Date Taking? Authorizing Provider  acetaminophen (TYLENOL) 325 MG tablet Take 2 tablets (650 mg total) by mouth every 6  (six) hours as needed for mild pain (or Fever >/= 101). Patient not taking: Reported on 10/22/2017 10/10/16   Nicholes Mango, MD  albuterol (PROVENTIL HFA;VENTOLIN HFA) 108 (90 Base) MCG/ACT inhaler Inhale 2 puffs into the lungs every 6 (six) hours as needed for wheezing or shortness of breath.    [provider]  albuterol (PROVENTIL) (2.5 MG/3ML) 0.083% nebulizer solution Take 2.5 mg by nebulization 4 (four) times daily as needed for wheezing or shortness of breath.    [provider]  ALPRAZolam Duanne Moron) 0.5 MG tablet Take 1 tablet (0.5 mg total) by mouth 2 (two) times daily as needed. 12/08/16   Bettey Costa, MD  aspirin 81 MG chewable tablet Chew by mouth every morning.    [provider]  atorvastatin (LIPITOR) 10 MG tablet Take 10 mg by mouth every evening. 05/16/16   [provider]  benzonatate (TESSALON) 100 MG capsule Take 100 mg by mouth 3 (three) times daily as needed for cough.     [provider]  bisacodyl (DULCOLAX) 10 MG suppository Place 0.5 suppositories (5 mg total) rectally daily as needed for moderate constipation. Patient not taking: Reported on 10/22/2017 10/10/16   Nicholes Mango, MD  Calcium Carbonate-Vitamin D (OYSTERCAL 500 + D PO) Take 1 tablet by mouth 2 (two) times daily.    [provider]  celecoxib (CELEBREX) 200 MG capsule Take 200 mg by mouth 2 (two) times daily. 09/09/17   [provider]  clopidogrel (PLAVIX) 75 MG  tablet Take 75 mg by mouth every morning.     [provider]  docusate sodium (COLACE) 50 MG capsule Take 50 mg by mouth daily as needed.     [provider]  feeding supplement, ENSURE ENLIVE, (ENSURE ENLIVE) LIQD Take 237 mLs by mouth 2 (two) times daily between meals. 12/08/16   Bettey Costa, MD  fexofenadine (ALLEGRA) 180 MG tablet Take 180 mg by mouth daily as needed for allergies.     [provider]  FLUoxetine (PROZAC) 20 MG capsule Take 1 capsule (20 mg total) by  mouth daily. 09/01/15   Aldean Jewett, MD  furosemide (LASIX) 40 MG tablet Take 1 tablet (40 mg total) by mouth daily. Start on TUesday 4/3 12/08/16   Bettey Costa, MD  guaiFENesin-dextromethorphan (ROBITUSSIN DM) 100-10 MG/5ML syrup Take 10 mLs by mouth every 6 (six) hours as needed for cough. Patient not taking: Reported on 10/22/2017 10/10/16   Nicholes Mango, MD  hydrALAZINE (APRESOLINE) 25 MG tablet Take 1 tablet (25 mg total) by mouth every 8 (eight) hours. 10/10/16   Nicholes Mango, MD  HYDROcodone-acetaminophen (NORCO/VICODIN) 5-325 MG tablet Take 1 tablet by mouth every 6 (six) hours as needed for moderate pain. 10/10/16   Gouru, Illene Silver, MD  ipratropium-albuterol (DUONEB) 0.5-2.5 (3) MG/3ML SOLN Take 3 mLs by nebulization every 4 (four) hours as needed. 10/10/16   Gouru, Illene Silver, MD  ipratropium-albuterol (DUONEB) 0.5-2.5 (3) MG/3ML SOLN Take 3 mLs by nebulization every 4 (four) hours as needed. 12/16/17   Merlyn Lot, MD  latanoprost (XALATAN) 0.005 % ophthalmic solution Place 1 drop into both eyes at bedtime. 05/10/16   [provider]  losartan (COZAAR) 25 MG tablet Take 25 mg by mouth daily.    [provider]  metoprolol (LOPRESSOR) 25 MG tablet Take 1 tablet (25 mg total) by mouth 2 (two) times daily. 10/10/16   Gouru, Illene Silver, MD  mometasone-formoterol (DULERA) 100-5 MCG/ACT AERO Inhale 2 puffs into the lungs 2 (two) times daily. Patient not taking: Reported on 10/22/2017 09/01/15   Aldean Jewett, MD  pantoprazole (PROTONIX) 40 MG tablet Take 40 mg by mouth every morning.     [provider]  predniSONE (DELTASONE) 10 MG tablet Take 1 tablet (10 mg total) by mouth daily. Day 1-2: Take 50 mg  ( 5 pills) Day 3-4 : Take 40 mg (4pills) Day 5-6: Take 30 mg (3 pills) Day 7-8:  Take 20 mg (2 pills) Day 9:  Take 10mg  (1 pill) 12/16/17   Merlyn Lot, MD  predniSONE (DELTASONE) 20 MG tablet Take 3 tablets (60 mg total) by mouth daily. 10/22/17   Hinda Kehr, MD    saccharomyces boulardii (FLORASTOR) 250 MG capsule Take 1 capsule (250 mg total) by mouth 2 (two) times daily. Patient not taking: Reported on 10/22/2017 10/10/16   Nicholes Mango, MD  tiotropium (SPIRIVA HANDIHALER) 18 MCG inhalation capsule Place 1 capsule (18 mcg total) into inhaler and inhale every morning. 09/01/15   Aldean Jewett, MD  tiZANidine (ZANAFLEX) 2 MG tablet Take 2 mg by mouth 3 (three) times daily as needed for muscle spasms.     [provider]  traZODone (DESYREL) 50 MG tablet Take 50 mg by mouth at bedtime.     [provider]    Allergies Codeine  Family History  Problem Relation Age of Onset  . CAD Father     Social History Social History   Tobacco Use  . Smoking status: Former Smoker  Packs/day: 0.50    Years: 55.00    Pack years: 27.50    Last attempt to quit: 11/21/2017    Years since quitting: 0.0  . Smokeless tobacco: Never Used  Substance Use Topics  . Alcohol use: No  . Drug use: No    Review of Systems Level 5 exemption history limited by the patient's clinical condition  ____________________________________________   PHYSICAL EXAM:  VITAL SIGNS: ED Triage Vitals [12/22/17 1244]  Enc Vitals Group     BP      Pulse      Resp      Temp      Temp src      SpO2      Weight 168 lb (76.2 kg)     Height 5\' 4"  (1.626 m)     Head Circumference      Peak Flow      Pain Score 0     Pain Loc      Pain Edu?      Excl. in [place]?     Constitutional: Moderate respiratory distress and ill-appearing speaking in 1-2 word gasps Eyes: PERRL EOMI. Head: Atraumatic. Nose: No congestion/rhinnorhea. Mouth/Throat: No trismus Neck: No stridor.   Cardiovascular: Tachycardic rate, regular rhythm. Grossly normal heart sounds.  Good peripheral circulation. Respiratory: Moderate respiratory distress using accessory muscles with tight lungs with prolonged expiratory phase and wheezing throughout Gastrointestinal: Soft  nontender Musculoskeletal: No lower extremity edema   Neurologic:  No gross focal neurologic deficits are appreciated. Skin:  Skin is warm, dry and intact. No rash noted. Psychiatric: Anxious appearing    ____________________________________________   DIFFERENTIAL includes but not limited to  COPD exacerbation, pneumonia, pneumothorax, pulmonary embolism ____________________________________________   LABS (all labs ordered are listed, but only abnormal results are displayed)  Labs Reviewed  COMPREHENSIVE METABOLIC PANEL - Abnormal; Notable for the following components:      Result Value   Sodium 133 (*)    Chloride 95 (*)    Glucose, Bld 122 (*)    BUN 29 (*)    Creatinine, Ser 1.14 (*)    Calcium 8.2 (*)    ALT 12 (*)    GFR calc non Af Amer 43 (*)    GFR calc Af Amer 50 (*)    All other components within normal limits  CBC WITH DIFFERENTIAL/PLATELET - Abnormal; Notable for the following components:   WBC 19.3 (*)    RBC 3.33 (*)    Hemoglobin 11.7 (*)    HCT 34.0 (*)    MCV 102.0 (*)    MCH 35.0 (*)    RDW 15.1 (*)    Neutro Abs 15.2 (*)    Monocytes Absolute 1.6 (*)    All other components within normal limits  BLOOD GAS, VENOUS - Abnormal; Notable for the following components:   pCO2, Ven 66 (*)    All other components within normal limits  TROPONIN I  BRAIN NATRIURETIC PEPTIDE    Lab work reviewed by me shows elevated white count which is nonspecific and could be secondary to infection or recent steroid use __________________________________________  EKG   ____________________________________________  RADIOLOGY  Chest x-ray reviewed by me with chronic changes but no acute disease ____________________________________________   PROCEDURES  Procedure(s) performed: no  .Critical Care Performed by: Darel Hong, MD Authorized by: Darel Hong, MD   Critical care provider statement:    Critical care time (minutes):  30   Critical care time  was exclusive of:  Separately billable procedures and treating other patients   Critical care was necessary to treat or prevent imminent or life-threatening deterioration of the following conditions:  Respiratory failure   Critical care was time spent personally by me on the following activities:  Development of treatment plan with patient or surrogate, discussions with consultants, evaluation of patient's response to treatment, examination of patient, obtaining history from patient or surrogate, ordering and performing treatments and interventions, ordering and review of laboratory studies, ordering and review of radiographic studies, pulse oximetry, re-evaluation of patient's condition and review of old charts    Critical Care performed: Yes  Observation: no ____________________________________________   INITIAL IMPRESSION / ASSESSMENT AND PLAN / ED COURSE  Pertinent labs & imaging results that were available during my care of the patient were reviewed by me and considered in my medical decision making (see chart for details).  The patient arrives ill-appearing and quite short of breath.  We will continue beta agonist along with magnesium now.  Chest x-ray and a azithromycin for the time being.    ----------------------------------------- 1:47 PM on 12/22/2017 -----------------------------------------  After 3 more duo nebs the patient is hypoxic to the low 80s on room air and remains extremely wheezy.  I recommended further beta agonist however the patient declined stating she does not want more albuterol at this time.  Given her declining respiratory status and do believe she requires BiPAP at this time.  She requires inpatient admission for further beta agonist, steroids, and respiratory support.  The patient verbalizes understanding and agreement the plan.  I then discussed the case with Dr. Bridgett Larsson who has graciously agreed to admit the patient to his  service. ____________________________________________   FINAL CLINICAL IMPRESSION(S) / ED DIAGNOSES  Final diagnoses:  Acute respiratory failure with hypoxia (HCC)  COPD with acute exacerbation (HCC)      NEW MEDICATIONS STARTED DURING THIS VISIT:  New Prescriptions   No medications on file     Note:  This document was prepared using Dragon voice recognition software and may include unintentional dictation errors.     Darel Hong, MD 12/22/17 1350

## 2017-12-22 NOTE — Progress Notes (Signed)
Pt lying in bed transferred to rm [id number] report called to nicki.

## 2017-12-22 NOTE — H&P (Addendum)
[facility] at [place] NAME: Debra [name]    MR#:  [id number]  DATE OF BIRTH:  12-25-1932  DATE OF ADMISSION:  12/22/2017  PRIMARY CARE PHYSICIAN: Jodi Marble, MD   REQUESTING/REFERRING PHYSICIAN: Darel Hong, MD CHIEF COMPLAINT:  No chief complaint on file.  Worsening shortness of breath and cough and wheezing since yesterday. HISTORY OF PRESENT ILLNESS:  Debra [name]  is a 84 y.o. female with a known history of COPD, CHF, hypertension and MI.  The patient presents in ED with above chief complaints.  She denies any fever or chills, no chest pain or palpitation.  She is found hypoxia and put on 4 L oxygen.  She is treated with nebulizer without improvement.  ED physician requested BiPAP and stepdown admission.  Chest x-ray did not show any infiltrate or edema.  PAST MEDICAL HISTORY:   Past Medical History:  Diagnosis Date  . Asthma   . CHF (congestive heart failure) ([place])   . COPD (chronic obstructive pulmonary disease) ([place])   . Myocardial infarction [facility])     PAST SURGICAL HISTORY:   Past Surgical History:  Procedure Laterality Date  . ABDOMINAL HYSTERECTOMY    . BREAST SURGERY    . BYPASS GRAFT  2007   triple  . CHOLECYSTECTOMY    . CORONARY ARTERY BYPASS GRAFT    . TONSILLECTOMY      SOCIAL HISTORY:   Social History   Tobacco Use  . Smoking status: Former Smoker    Packs/day: 0.50    Years: 55.00    Pack years: 27.50    Last attempt to quit: 11/21/2017    Years since quitting: 0.0  . Smokeless tobacco: Never Used  Substance Use Topics  . Alcohol use: No    FAMILY HISTORY:   Family History  Problem Relation Age of Onset  . CAD Father     DRUG ALLERGIES:   Allergies  Allergen Reactions  . Codeine Itching and Rash    REVIEW OF SYSTEMS:   Review of Systems  Constitutional: Positive for malaise/fatigue. Negative for chills and fever.  HENT: Negative for sore throat.   Eyes: Negative for  blurred vision and double vision.  Respiratory: Positive for cough, sputum production, shortness of breath and wheezing. Negative for hemoptysis and stridor.   Cardiovascular: Negative for chest pain, palpitations, orthopnea and leg swelling.  Gastrointestinal: Negative for abdominal pain, blood in stool, diarrhea, melena, nausea and vomiting.  Genitourinary: Negative for dysuria, flank pain and hematuria.  Musculoskeletal: Negative for back pain and joint pain.  Skin: Negative for rash.  Neurological: Negative for dizziness, sensory change, focal weakness, seizures, loss of consciousness, weakness and headaches.  Endo/Heme/Allergies: Negative for polydipsia.  Psychiatric/Behavioral: Negative for depression. The patient is not nervous/anxious.     MEDICATIONS AT HOME:   Prior to Admission medications   Medication Sig Start Date End Date Taking? Authorizing Provider  acetaminophen (TYLENOL) 325 MG tablet Take 2 tablets (650 mg total) by mouth every 6 (six) hours as needed for mild pain (or Fever >/= 101). Patient not taking: Reported on 10/22/2017 10/10/16   Nicholes Mango, MD  albuterol (PROVENTIL HFA;VENTOLIN HFA) 108 (90 Base) MCG/ACT inhaler Inhale 2 puffs into the lungs every 6 (six) hours as needed for wheezing or shortness of breath.    [provider]  albuterol (PROVENTIL) (2.5 MG/3ML) 0.083% nebulizer solution Take 2.5 mg by nebulization 4 (four) times daily as needed for wheezing or shortness of  breath.    [provider]  ALPRAZolam Duanne Moron) 0.5 MG tablet Take 1 tablet (0.5 mg total) by mouth 2 (two) times daily as needed. 12/08/16   Bettey Costa, MD  aspirin 81 MG chewable tablet Chew by mouth every morning.    [provider]  atorvastatin (LIPITOR) 10 MG tablet Take 10 mg by mouth every evening. 05/16/16   [provider]  benzonatate (TESSALON) 100 MG capsule Take 100 mg by mouth 3 (three) times daily as needed for cough.     [provider]    bisacodyl (DULCOLAX) 10 MG suppository Place 0.5 suppositories (5 mg total) rectally daily as needed for moderate constipation. Patient not taking: Reported on 10/22/2017 10/10/16   Nicholes Mango, MD  Calcium Carbonate-Vitamin D (OYSTERCAL 500 + D PO) Take 1 tablet by mouth 2 (two) times daily.    [provider]  celecoxib (CELEBREX) 200 MG capsule Take 200 mg by mouth 2 (two) times daily. 09/09/17   [provider]  clopidogrel (PLAVIX) 75 MG tablet Take 75 mg by mouth every morning.     [provider]  docusate sodium (COLACE) 50 MG capsule Take 50 mg by mouth daily as needed.     [provider]  feeding supplement, ENSURE ENLIVE, (ENSURE ENLIVE) LIQD Take 237 mLs by mouth 2 (two) times daily between meals. 12/08/16   Bettey Costa, MD  fexofenadine (ALLEGRA) 180 MG tablet Take 180 mg by mouth daily as needed for allergies.     [provider]  FLUoxetine (PROZAC) 20 MG capsule Take 1 capsule (20 mg total) by mouth daily. 09/01/15   Aldean Jewett, MD  furosemide (LASIX) 40 MG tablet Take 1 tablet (40 mg total) by mouth daily. Start on TUesday 4/3 12/08/16   Bettey Costa, MD  guaiFENesin-dextromethorphan (ROBITUSSIN DM) 100-10 MG/5ML syrup Take 10 mLs by mouth every 6 (six) hours as needed for cough. Patient not taking: Reported on 10/22/2017 10/10/16   Nicholes Mango, MD  hydrALAZINE (APRESOLINE) 25 MG tablet Take 1 tablet (25 mg total) by mouth every 8 (eight) hours. 10/10/16   Nicholes Mango, MD  HYDROcodone-acetaminophen (NORCO/VICODIN) 5-325 MG tablet Take 1 tablet by mouth every 6 (six) hours as needed for moderate pain. 10/10/16   Gouru, Illene Silver, MD  ipratropium-albuterol (DUONEB) 0.5-2.5 (3) MG/3ML SOLN Take 3 mLs by nebulization every 4 (four) hours as needed. 10/10/16   Gouru, Illene Silver, MD  ipratropium-albuterol (DUONEB) 0.5-2.5 (3) MG/3ML SOLN Take 3 mLs by nebulization every 4 (four) hours as needed. 12/16/17   Merlyn Lot, MD  latanoprost (XALATAN)  0.005 % ophthalmic solution Place 1 drop into both eyes at bedtime. 05/10/16   [provider]  losartan (COZAAR) 25 MG tablet Take 25 mg by mouth daily.    [provider]  metoprolol (LOPRESSOR) 25 MG tablet Take 1 tablet (25 mg total) by mouth 2 (two) times daily. 10/10/16   Gouru, Illene Silver, MD  mometasone-formoterol (DULERA) 100-5 MCG/ACT AERO Inhale 2 puffs into the lungs 2 (two) times daily. Patient not taking: Reported on 10/22/2017 09/01/15   Aldean Jewett, MD  pantoprazole (PROTONIX) 40 MG tablet Take 40 mg by mouth every morning.     [provider]  predniSONE (DELTASONE) 10 MG tablet Take 1 tablet (10 mg total) by mouth daily. Day 1-2: Take 50 mg  ( 5 pills) Day 3-4 : Take 40 mg (4pills) Day 5-6: Take 30 mg (3 pills) Day 7-8:  Take 20 mg (2 pills) Day  9:  Take 10mg  (1 pill) 12/16/17   Merlyn Lot, MD  predniSONE (DELTASONE) 20 MG tablet Take 3 tablets (60 mg total) by mouth daily. Patient not taking: Reported on 12/22/2017 10/22/17   Hinda Kehr, MD  saccharomyces boulardii (FLORASTOR) 250 MG capsule Take 1 capsule (250 mg total) by mouth 2 (two) times daily. Patient not taking: Reported on 10/22/2017 10/10/16   Nicholes Mango, MD  tiotropium (SPIRIVA HANDIHALER) 18 MCG inhalation capsule Place 1 capsule (18 mcg total) into inhaler and inhale every morning. 09/01/15   Aldean Jewett, MD  tiZANidine (ZANAFLEX) 2 MG tablet Take 2 mg by mouth 3 (three) times daily as needed for muscle spasms.     [provider]  traZODone (DESYREL) 50 MG tablet Take 50 mg by mouth at bedtime.     [provider]      VITAL SIGNS:  Blood pressure (!) 98/34, pulse 92, temperature 99.5 F (37.5 C), temperature source Axillary, resp. rate (!) 28, height 5\' 4"  (1.626 m), weight 168 lb (76.2 kg), SpO2 91 %.  PHYSICAL EXAMINATION:  Physical Exam  GENERAL:  84 y.o.-year-old patient lying in the bed with no acute distress.  Obesity. EYES: Pupils equal,  round, reactive to light and accommodation. No scleral icterus. Extraocular muscles intact.  HEENT: Head atraumatic, normocephalic. Oropharynx and nasopharynx clear.  NECK:  Supple, no jugular venous distention. No thyroid enlargement, no tenderness.  LUNGS: Diminished breath sounds bilaterally, expiratory wheezing, rales,rhonchi or crepitation. With use of accessory muscles of respiration.  CARDIOVASCULAR: S1, S2 normal. No murmurs, rubs, or gallops.  ABDOMEN: Soft, nontender, nondistended. Bowel sounds present. No organomegaly or mass.  EXTREMITIES: No pedal edema, cyanosis, or clubbing.  NEUROLOGIC: Cranial nerves II through XII are intact. Muscle strength 5/5 in all extremities. Sensation intact. Gait not checked.  PSYCHIATRIC: The patient is alert and oriented x 3.  SKIN: No obvious rash, lesion, or ulcer.   LABORATORY PANEL:   CBC Recent Labs  Lab 12/22/17 1305  WBC 19.3*  HGB 11.7*  HCT 34.0*  PLT 349   ------------------------------------------------------------------------------------------------------------------  Chemistries  Recent Labs  Lab 12/22/17 1305  NA 133*  K 3.9  CL 95*  CO2 30  GLUCOSE 122*  BUN 29*  CREATININE 1.14*  CALCIUM 8.2*  AST 23  ALT 12*  ALKPHOS 62  BILITOT 0.9   ------------------------------------------------------------------------------------------------------------------  Cardiac Enzymes Recent Labs  Lab 12/22/17 1305  TROPONINI <0.03   ------------------------------------------------------------------------------------------------------------------  RADIOLOGY:  Dg Chest Port 1 View  Result Date: 12/22/2017 CLINICAL DATA:  Labored breathing. History of COPD, CHF, MI. History of smoking. EXAM: PORTABLE CHEST 1 VIEW COMPARISON:  Chest x-rays dated 12/16/2017 and 12/07/2016. FINDINGS: Stable cardiomegaly. Coarse lung markings again noted bilaterally, stable indicating chronic interstitial lung disease. No new lung findings. No  pleural effusion or pneumothorax seen. Prominent pulmonary arteries suggesting chronic pulmonary artery hypertension. IMPRESSION: 1. No acute findings.  No evidence of pneumonia or pulmonary edema. 2. Stable cardiomegaly. 3. Chronic interstitial lung disease. 4. Probable chronic pulmonary artery hypertension. Electronically Signed   By: Franki Cabot M.D.   On: 12/22/2017 13:09      IMPRESSION AND PLAN:   Acute on chronic respiratory failure with hypoxia due to COPD exacerbation. The patient will be admitted to stepdown unit. BiPAP for now, try to wean off BiPAP. Albuterol every 6 hours, IV Solu-Medrol every 6 hours, Robitussin as needed.  Intensivist consult.  Hypertension.  Hold hypertension medication due to low side blood pressure. Hyponatremia and dehydration.  Hold Lasix and start normal saline IV.  Leukocytosis.  Unclear etiology.  Possible due to home prednisone.  Follow-up UA and CBC.  History of chronic diastolic CHF.  Stable.  Hold Lasix due to dehydration and low side blood pressure.  Discussed with Dr. Celesta Aver, intensivist. All the records are reviewed and case discussed with ED provider. Management plans discussed with the patient, her daughter and they are in agreement.  CODE STATUS: DNR  TOTAL CRITICAL TIME TAKING CARE OF THIS PATIENT: 58 minutes.    Demetrios Loll M.D on 12/22/2017 at 2:23 PM  Between 7am to 6pm - Pager - [phone]  After 6pm go to [email or web address] - Proofreader  [facility]  Office  [phone]  CC: Primary care physician; Jodi Marble, MD   Note: This dictation was prepared with Dragon dictation along with smaller phrase technology. Any transcriptional errors that result from this process are unin

## 2017-12-22 NOTE — ED Triage Notes (Signed)
Discharged Monday for COPD exacerbation. Last night worsening breahting.  Arrived labored with increased WOB.

## 2017-12-22 NOTE — ED Notes (Signed)
One unsuccessful attempt by this RN to obtain 2nd IV access. Several unsuccessful attempt were made by another RN. FAmily requested no more attempts at this time.

## 2017-12-23 ENCOUNTER — Ambulatory Visit: Payer: Medicare Other | Admitting: Internal Medicine

## 2017-12-23 DIAGNOSIS — J441 Chronic obstructive pulmonary disease with (acute) exacerbation: Secondary | ICD-10-CM

## 2017-12-23 DIAGNOSIS — J9601 Acute respiratory failure with hypoxia: Secondary | ICD-10-CM

## 2017-12-23 LAB — BASIC METABOLIC PANEL
ANION GAP: 5 (ref 5–15)
BUN: 28 mg/dL — AB (ref 6–20)
CALCIUM: 8.2 mg/dL — AB (ref 8.9–10.3)
CO2: 32 mmol/L (ref 22–32)
CREATININE: 1.01 mg/dL — AB (ref 0.44–1.00)
Chloride: 99 mmol/L — ABNORMAL LOW (ref 101–111)
GFR calc Af Amer: 58 mL/min — ABNORMAL LOW (ref >60)
GFR, EST NON AFRICAN AMERICAN: 50 mL/min — AB (ref >60)
GLUCOSE: 153 mg/dL — AB (ref 65–99)
Potassium: 4.5 mmol/L (ref 3.5–5.1)
Sodium: 136 mmol/L (ref 135–145)

## 2017-12-23 LAB — CBC
HCT: 31.5 % — ABNORMAL LOW (ref 35.0–47.0)
HEMOGLOBIN: 10.7 g/dL — AB (ref 12.0–16.0)
MCH: 34.3 pg — AB (ref 26.0–34.0)
MCHC: 33.8 g/dL (ref 32.0–36.0)
MCV: 101.4 fL — ABNORMAL HIGH (ref 80.0–100.0)
Platelets: 295 10*3/uL (ref 150–440)
RBC: 3.11 MIL/uL — ABNORMAL LOW (ref 3.80–5.20)
RDW: 14.7 % — AB (ref 11.5–14.5)
WBC: 16.2 10*3/uL — ABNORMAL HIGH (ref 3.6–11.0)

## 2017-12-23 MED ORDER — FUROSEMIDE 40 MG PO TABS
40.0000 mg | ORAL_TABLET | Freq: Every day | ORAL | Status: DC
  Administered 2017-12-24 – 2017-12-28 (×5): 40 mg via ORAL
  Filled 2017-12-23 (×5): qty 1

## 2017-12-23 MED ORDER — AZITHROMYCIN 250 MG PO TABS
250.0000 mg | ORAL_TABLET | Freq: Every day | ORAL | Status: AC
  Administered 2017-12-24 – 2017-12-26 (×3): 250 mg via ORAL
  Filled 2017-12-23 (×3): qty 1

## 2017-12-23 MED ORDER — SODIUM CHLORIDE 0.9% FLUSH
3.0000 mL | Freq: Two times a day (BID) | INTRAVENOUS | Status: DC
  Administered 2017-12-24 – 2017-12-28 (×5): 3 mL via INTRAVENOUS

## 2017-12-23 MED ORDER — SODIUM CHLORIDE 0.9% FLUSH: 3.0000 mL | INTRAVENOUS | Status: DC | PRN

## 2017-12-23 MED ORDER — METHYLPREDNISOLONE SODIUM SUCC 40 MG IJ SOLR
40.0000 mg | Freq: Three times a day (TID) | INTRAMUSCULAR | Status: DC
  Administered 2017-12-23 – 2017-12-25 (×6): 40 mg via INTRAVENOUS
  Filled 2017-12-23 (×6): qty 1

## 2017-12-23 MED ORDER — ALBUTEROL SULFATE (2.5 MG/3ML) 0.083% IN NEBU
2.5000 mg | INHALATION_SOLUTION | Freq: Three times a day (TID) | RESPIRATORY_TRACT | Status: DC
  Administered 2017-12-23 – 2017-12-25 (×7): 2.5 mg via RESPIRATORY_TRACT
  Filled 2017-12-23 (×7): qty 3

## 2017-12-23 MED ORDER — SODIUM CHLORIDE 0.9% FLUSH
3.0000 mL | Freq: Two times a day (BID) | INTRAVENOUS | Status: DC
  Administered 2017-12-23 – 2017-12-28 (×9): 3 mL via INTRAVENOUS

## 2017-12-23 NOTE — Consult Note (Signed)
[facility] Consultation      Assessment and Plan:  Acute hypoxic and hypercapnic on chronic respiratory failure. Acute exacerbation of COPD with acute bronchitis.  - Continue IV steroids. -Azithromycin for bronchitis. -Duo nebs -BiPAP nightly while the patient is admitted to the hospital.   Date: 12/23/2017  MRN# [id number] DALEYZA [name] 06-May-1933  Referring Physician: Dr. Bridgett Larsson.   Debra [name] is a 84 y.o. old female seen in consultation for chief complaint of: dyspnea.     HPI:   The patient is a 84 year old female with a history of severe COPD, oxygen dependent.  She has been seen in my office for history of COPD with recurrent exacerbations, as well as chronic systolic congestive heart failure, nicotine abuse.  She has had multiple admissions to the hospital so far this year for cardiac and pulmonary issues, including COPD and CHF exacerbation. She returned to the hospital yesterday for progressive shortness of breath.  She was initially given IV Solu-Medrol, transferred to the ICU and started on BiPAP.  She noted that treatment with BiPAP appeared to help with her breathing.  She feels that her breathing is improving but is still short of breath. Metabolic panel continues to show elevated carbon dioxide levels consistent with chronic hypercapnia.  Imaging personally reviewed, chest x-ray 12/22/17, shows severe hyperinflation consistent with emphysema, mild pulmonary edema.  PMHX:   Past Medical History:  Diagnosis Date  . Asthma   . CHF (congestive heart failure) ([place])   . COPD (chronic obstructive pulmonary disease) ([place])   . Myocardial infarction [facility])    Surgical Hx:  Past Surgical History:  Procedure Laterality Date  . ABDOMINAL HYSTERECTOMY    . BREAST SURGERY    . BYPASS GRAFT  2007   triple  . CHOLECYSTECTOMY    . CORONARY ARTERY BYPASS GRAFT    . TONSILLECTOMY     Family Hx:  Family History  Problem Relation Age of Onset  . CAD  Father    Social Hx:   Social History   Tobacco Use  . Smoking status: Former Smoker    Packs/day: 0.50    Years: 55.00    Pack years: 27.50    Last attempt to quit: 11/21/2017    Years since quitting: 0.0  . Smokeless tobacco: Never Used  Substance Use Topics  . Alcohol use: No  . Drug use: No   Medication:    Current Facility-Administered Medications:  .  0.9 %  sodium chloride infusion, , Intravenous, Continuous, Demetrios Loll, MD, Last Rate: 50 mL/hr at 12/23/17 1121 .  acetaminophen (TYLENOL) tablet 650 mg, 650 mg, Oral, Q6H PRN, 650 mg at 12/23/17 1019 **OR** acetaminophen (TYLENOL) suppository 650 mg, 650 mg, Rectal, Q6H PRN, Demetrios Loll, MD .  albuterol (PROVENTIL) (2.5 MG/3ML) 0.083% nebulizer solution 2.5 mg, 2.5 mg, Nebulization, Q2H PRN, Demetrios Loll, MD, 2.5 mg at 12/23/17 1525 .  albuterol (PROVENTIL) (2.5 MG/3ML) 0.083% nebulizer solution 2.5 mg, 2.5 mg, Nebulization, TID, Demetrios Loll, MD .  ALPRAZolam Duanne Moron) tablet 0.5 mg, 0.5 mg, Oral, BID PRN, Demetrios Loll, MD .  aspirin chewable tablet 81 mg, 81 mg, Oral, Beaulah Dinning, MD, 81 mg at 12/23/17 0505 .  atorvastatin (LIPITOR) tablet 10 mg, 10 mg, Oral, QPM, Demetrios Loll, MD, 10 mg at 12/22/17 1709 .  [START ON 12/24/2017] azithromycin (ZITHROMAX) tablet 250 mg, 250 mg, Oral, Daily, Manuella Ghazi, Vipul, MD .  benzonatate (TESSALON) capsule 100 mg, 100 mg, Oral, TID PRN, Demetrios Loll, MD .  bisacodyl (DULCOLAX) EC tablet 5 mg, 5 mg, Oral, Daily PRN, Demetrios Loll, MD .  clopidogrel (PLAVIX) tablet 75 mg, 75 mg, Oral, Beaulah Dinning, MD, 75 mg at 12/23/17 0504 .  enoxaparin (LOVENOX) injection 40 mg, 40 mg, Subcutaneous, Q24H, Demetrios Loll, MD, 40 mg at 12/22/17 2214 .  FLUoxetine (PROZAC) capsule 20 mg, 20 mg, Oral, Daily, Demetrios Loll, MD, 20 mg at 12/23/17 1001 .  [START ON 12/24/2017] furosemide (LASIX) tablet 40 mg, 40 mg, Oral, Daily, Manuella Ghazi, Vipul, MD .  guaiFENesin (ROBITUSSIN) 100 MG/5ML solution 100 mg, 5 mL, Oral, Q4H PRN, Demetrios Loll, MD, 100 mg at 12/22/17 1940 .  latanoprost (XALATAN) 0.005 % ophthalmic solution 1 drop, 1 drop, Both Eyes, QHS, Demetrios Loll, MD .  methylPREDNISolone sodium succinate (SOLU-MEDROL) 125 mg/2 mL injection 60 mg, 60 mg, Intravenous, Q6H, Demetrios Loll, MD, 60 mg at 12/23/17 1001 .  ondansetron (ZOFRAN) tablet 4 mg, 4 mg, Oral, Q6H PRN **OR** ondansetron (ZOFRAN) injection 4 mg, 4 mg, Intravenous, Q6H PRN, Demetrios Loll, MD .  pantoprazole (PROTONIX) EC tablet 40 mg, 40 mg, Oral, Beaulah Dinning, MD, 40 mg at 12/23/17 0504 .  senna-docusate (Senokot-S) tablet 1 tablet, 1 tablet, Oral, QHS PRN, Demetrios Loll, MD .  sodium chloride flush (NS) 0.9 % injection 3 mL, 3 mL, Intravenous, Q12H, Shah, Vipul, MD .  sodium chloride flush (NS) 0.9 % injection 3 mL, 3 mL, Intravenous, Q12H, Manuella Ghazi, Vipul, MD, 3 mL at 12/23/17 1249 .  sodium chloride flush (NS) 0.9 % injection 3 mL, 3 mL, Intravenous, PRN, Manuella Ghazi, Vipul, MD .  tiotropium (SPIRIVA) inhalation capsule 18 mcg, 18 mcg, Inhalation, q morning - 10a, Demetrios Loll, MD, 18 mcg at 12/23/17 1248 .  tiZANidine (ZANAFLEX) tablet 2 mg, 2 mg, Oral, TID PRN, Demetrios Loll, MD, 2 mg at 12/23/17 1121 .  traZODone (DESYREL) tablet 50 mg, 50 mg, Oral, QHS, Demetrios Loll, MD, 50 mg at 12/22/17 2213   Allergies:  Codeine  Review of Systems: Gen:  Denies  fever, sweats, chills HEENT: Denies blurred vision, double vision. bleeds, sore throat Cvc:  No dizziness, chest pain. Resp:   Denies cough or sputum production. Gi: Denies swallowing difficulty, stomach pain. Gu:  Denies bladder incontinence, burning urine Ext:   No Joint pain, stiffness. Skin: No skin rash,  hives  Endoc:  No polyuria, polydipsia. Psych: No depression, insomnia. Other:  All other systems were reviewed with the patient and were negative other that what is mentioned in the HPI.   Physical Examination:   VS: BP 101/68   Pulse 76   Temp 98.3 F (36.8 C) (Oral)   Resp 18   Ht 5\' 4"  (1.626 m)   Wt  166 lb 3.6 oz (75.4 kg) Comment: pt refusing to stand, per pt " i am too weak to stand"  SpO2 98%   BMI 28.53 kg/m   General Appearance: No distress  Neuro:without focal findings,  speech normal,  HEENT: PERRLA, EOM intact.   Pulmonary: normal breath sounds, scattered bilateral wheezing.  CardiovascularNormal S1,S2.  No m/r/g.   Abdomen: Benign, Soft, non-tender. Renal:  No costovertebral tenderness  GU:  No performed at this time. Endoc: No evident thyromegaly, no signs of acromegaly. Skin:   warm, no rashes, no ecchymosis  Extremities: normal, no cyanosis, clubbing.  Other findings:    LABORATORY PANEL:   CBC Recent Labs  Lab 12/23/17 0423  WBC 16.2*  HGB 10.7*  HCT 31.5*  PLT 295   ------------------------------------------------------------------------------------------------------------------  Chemistries  Recent Labs  Lab 12/22/17 1305 12/23/17 0423  NA 133* 136  K 3.9 4.5  CL 95* 99*  CO2 30 32  GLUCOSE 122* 153*  BUN 29* 28*  CREATININE 1.14* 1.01*  CALCIUM 8.2* 8.2*  MG 1.8  --   AST 23  --   ALT 12*  --   ALKPHOS 62  --   BILITOT 0.9  --    ------------------------------------------------------------------------------------------------------------------  Cardiac Enzymes Recent Labs  Lab 12/22/17 1305  TROPONINI <0.03   ------------------------------------------------------------  RADIOLOGY:  Dg Chest Port 1 View  Result Date: 12/22/2017 CLINICAL DATA:  Labored breathing. History of COPD, CHF, MI. History of smoking. EXAM: PORTABLE CHEST 1 VIEW COMPARISON:  Chest x-rays dated 12/16/2017 and 12/07/2016. FINDINGS: Stable cardiomegaly. Coarse lung markings again noted bilaterally, stable indicating chronic interstitial lung disease. No new lung findings. No pleural effusion or pneumothorax seen. Prominent pulmonary arteries suggesting chronic pulmonary artery hypertension. IMPRESSION: 1. No acute findings.  No evidence of pneumonia or pulmonary  edema. 2. Stable cardiomegaly. 3. Chronic interstitial lung disease. 4. Probable chronic pulmonary artery hypertension. Electronically Signed   By: Franki Cabot M.D.   On: 12/22/2017 13:09       Thank  you for the consultation and for allowing [facility] to assist in the care of your patient. Our recommendations are noted above.  Please contact us if we can be of further service.   Marda Stalker, MD.  Board Certified in Internal Medicine, Pulmonary Medicine, [place], and Sleep Medicine.  [facility] Office Number: [phone]  Patricia Pesa, M.D.  Merton Border, M.D  12/23/2017

## 2017-12-23 NOTE — Progress Notes (Signed)
[place] at [place] NAME: Brylea Pita    MR#:  [id number]  DATE OF BIRTH:  07-11-1933  SUBJECTIVE:  CHIEF COMPLAINT:  No chief complaint on file. still SOB, REVIEW OF SYSTEMS:  Review of Systems  Constitutional: Negative for chills, fever and weight loss.  HENT: Negative for nosebleeds and sore throat.   Eyes: Negative for blurred vision.  Respiratory: Positive for shortness of breath. Negative for cough and wheezing.   Cardiovascular: Negative for chest pain, orthopnea, leg swelling and PND.  Gastrointestinal: Negative for abdominal pain, constipation, diarrhea, heartburn, nausea and vomiting.  Genitourinary: Negative for dysuria and urgency.  Musculoskeletal: Negative for back pain.  Skin: Negative for rash.  Neurological: Negative for dizziness, speech change, focal weakness and headaches.  Endo/Heme/Allergies: Does not bruise/bleed easily.  Psychiatric/Behavioral: Negative for depression.    DRUG ALLERGIES:   Allergies  Allergen Reactions  . Codeine Itching and Rash   VITALS:  Blood pressure 101/68, pulse 76, temperature 98.3 F (36.8 C), temperature source Oral, resp. rate 18, height 5\' 4"  (1.626 m), weight 75.4 kg (166 lb 3.6 oz), SpO2 98 %. PHYSICAL EXAMINATION:  Physical Exam  Constitutional: She is oriented to person, place, and time.  HENT:  Head: Normocephalic and atraumatic.  Eyes: Pupils are equal, round, and reactive to light. Conjunctivae and EOM are normal.  Neck: Normal range of motion. Neck supple. No tracheal deviation present. No thyromegaly present.  Cardiovascular: Normal rate, regular rhythm and normal heart sounds.  Pulmonary/Chest: Effort normal and breath sounds normal. No respiratory distress. She has no wheezes. She exhibits no tenderness.  Abdominal: Soft. Bowel sounds are normal. She exhibits no distension. There is no tenderness.  Musculoskeletal: Normal range of motion.  Neurological: She is  alert and oriented to person, place, and time. No cranial nerve deficit.  Skin: Skin is warm and dry. No rash noted.   LABORATORY PANEL:  Female CBC Recent Labs  Lab 12/23/17 0423  WBC 16.2*  HGB 10.7*  HCT 31.5*  PLT 295   ------------------------------------------------------------------------------------------------------------------ Chemistries  Recent Labs  Lab 12/22/17 1305 12/23/17 0423  NA 133* 136  K 3.9 4.5  CL 95* 99*  CO2 30 32  GLUCOSE 122* 153*  BUN 29* 28*  CREATININE 1.14* 1.01*  CALCIUM 8.2* 8.2*  MG 1.8  --   AST 23  --   ALT 12*  --   ALKPHOS 62  --   BILITOT 0.9  --    RADIOLOGY:  No results found. ASSESSMENT AND PLAN:   * Acute on chronic respiratory failure with hypoxia due to COPD exacerbation. - wean O2 as tolerated - BiPAP prn - continue Albuterol every 6 hours, IV Solu-Medrol every 6 hours, Robitussin as needed.   - Appreciate PCCM input  * Hypertension.  Hold hypertension medication due to low side blood pressure.  * Hyponatremia and dehydration.  Hold Lasix and monitor Na with normal saline IV.  * Leukocytosis.  Unclear etiology.  Possible due to home prednisone.  Follow-up UA and CBC.  * History of chronic diastolic CHF.  Stable.  Hold Lasix due to dehydration and low side blood pressure.       All the records are reviewed and case discussed with Care Management/Social Worker. Management plans discussed with the patient, family and they are in agreement.  CODE STATUS: DNR  TOTAL TIME TAKING CARE OF THIS PATIENT: 35 minutes.   More than 50% of the time was spent in  counseling/coordination of care: YES  POSSIBLE D/C IN 1-2 DAYS, DEPENDING ON CLINICAL CONDITION.   Max Sane M.D on 12/23/2017 at 7:55 PM  Between 7am to 6pm - Pager - [phone]  After 6pm go to [email or web address] - Proofreader  [facility]  Office  [phone]  CC: Primary care physician; Jodi Marble,  MD  Note: This dictation was prepared with Dragon dictation along with smaller phrase technology. Any transcriptional errors that result from this process are unintentional.

## 2017-12-24 LAB — BASIC METABOLIC PANEL
ANION GAP: 5 (ref 5–15)
BUN: 29 mg/dL — AB (ref 6–20)
CHLORIDE: 100 mmol/L — AB (ref 101–111)
CO2: 29 mmol/L (ref 22–32)
Calcium: 8.6 mg/dL — ABNORMAL LOW (ref 8.9–10.3)
Creatinine, Ser: 0.9 mg/dL (ref 0.44–1.00)
GFR calc Af Amer: >60 mL/min (ref >60)
GFR, EST NON AFRICAN AMERICAN: 58 mL/min — AB (ref >60)
Glucose, Bld: 155 mg/dL — ABNORMAL HIGH (ref 65–99)
POTASSIUM: 4.4 mmol/L (ref 3.5–5.1)
Sodium: 134 mmol/L — ABNORMAL LOW (ref 135–145)

## 2017-12-24 LAB — CBC
HCT: 32.9 % — ABNORMAL LOW (ref 35.0–47.0)
HEMOGLOBIN: 11.1 g/dL — AB (ref 12.0–16.0)
MCH: 34.3 pg — AB (ref 26.0–34.0)
MCHC: 33.7 g/dL (ref 32.0–36.0)
MCV: 101.8 fL — ABNORMAL HIGH (ref 80.0–100.0)
PLATELETS: 346 10*3/uL (ref 150–440)
RBC: 3.23 MIL/uL — AB (ref 3.80–5.20)
RDW: 15.4 % — ABNORMAL HIGH (ref 11.5–14.5)
WBC: 18.2 10*3/uL — AB (ref 3.6–11.0)

## 2017-12-24 MED ORDER — HYDROCODONE-ACETAMINOPHEN 5-325 MG PO TABS
1.0000 | ORAL_TABLET | Freq: Four times a day (QID) | ORAL | Status: DC | PRN
  Administered 2017-12-24 – 2017-12-26 (×3): 1 via ORAL
  Filled 2017-12-24 (×3): qty 1

## 2017-12-24 MED ORDER — LOSARTAN POTASSIUM 25 MG PO TABS
25.0000 mg | ORAL_TABLET | Freq: Every day | ORAL | Status: DC
  Administered 2017-12-24 – 2017-12-25 (×2): 25 mg via ORAL
  Filled 2017-12-24 (×2): qty 1

## 2017-12-24 MED ORDER — PNEUMOCOCCAL VAC POLYVALENT 25 MCG/0.5ML IJ INJ
0.5000 mL | INJECTION | INTRAMUSCULAR | Status: AC
Start: 2017-12-25 — End: 2017-12-25
  Administered 2017-12-25: 0.5 mL via INTRAMUSCULAR
  Filled 2017-12-24: qty 0.5

## 2017-12-24 NOTE — Progress Notes (Addendum)
No psychiatrist  on call, called MD Clapac, per MD Clapac he will see pt in the AM.

## 2017-12-24 NOTE — Progress Notes (Signed)
[place] at [place] NAME: Debra [name]    MR#:  [id number]  DATE OF BIRTH:  Oct 31, 1932  SUBJECTIVE:  CHIEF COMPLAINT:  No chief complaint on file. seems anxious, still c/o SOB REVIEW OF SYSTEMS:  Review of Systems  Constitutional: Negative for chills, fever and weight loss.  HENT: Negative for nosebleeds and sore throat.   Eyes: Negative for blurred vision.  Respiratory: Positive for shortness of breath. Negative for cough and wheezing.   Cardiovascular: Negative for chest pain, orthopnea, leg swelling and PND.  Gastrointestinal: Negative for abdominal pain, constipation, diarrhea, heartburn, nausea and vomiting.  Genitourinary: Negative for dysuria and urgency.  Musculoskeletal: Negative for back pain.  Skin: Negative for rash.  Neurological: Negative for dizziness, speech change, focal weakness and headaches.  Endo/Heme/Allergies: Does not bruise/bleed easily.  Psychiatric/Behavioral: Negative for depression.   DRUG ALLERGIES:   Allergies  Allergen Reactions  . Codeine Itching and Rash   VITALS:  Blood pressure 112/76, pulse 78, temperature 97.7 F (36.5 C), resp. rate (!) 24, height 5\' 4"  (1.626 m), weight 77.1 kg (169 lb 15.6 oz), SpO2 91 %. PHYSICAL EXAMINATION:  Physical Exam  Constitutional: She is oriented to person, place, and time.  HENT:  Head: Normocephalic and atraumatic.  Eyes: Pupils are equal, round, and reactive to light. Conjunctivae and EOM are normal.  Neck: Normal range of motion. Neck supple. No tracheal deviation present. No thyromegaly present.  Cardiovascular: Normal rate, regular rhythm and normal heart sounds.  Pulmonary/Chest: Effort normal and breath sounds normal. No respiratory distress. She has no wheezes. She exhibits no tenderness.  Abdominal: Soft. Bowel sounds are normal. She exhibits no distension. There is no tenderness.  Musculoskeletal: Normal range of motion.  Neurological: She is  alert and oriented to person, place, and time. No cranial nerve deficit.  Skin: Skin is warm and dry. No rash noted.  Psychiatric: Her mood appears anxious.   LABORATORY PANEL:  Female CBC Recent Labs  Lab 12/24/17 0729  WBC 18.2*  HGB 11.1*  HCT 32.9*  PLT 346   ------------------------------------------------------------------------------------------------------------------ Chemistries  Recent Labs  Lab 12/22/17 1305  12/24/17 0729  NA 133*   < > 134*  K 3.9   < > 4.4  CL 95*   < > 100*  CO2 30   < > 29  GLUCOSE 122*   < > 155*  BUN 29*   < > 29*  CREATININE 1.14*   < > 0.90  CALCIUM 8.2*   < > 8.6*  MG 1.8  --   --   AST 23  --   --   ALT 12*  --   --   ALKPHOS 62  --   --   BILITOT 0.9  --   --    < > = values in this interval not displayed.   RADIOLOGY:  No results found. ASSESSMENT AND PLAN:   * Acute on chronic respiratory failure with hypoxia due to COPD exacerbation. - wean O2 as tolerated - BiPAP prn - continue Albuterol every 6 hours, IV Solu-Medrol every 6 hours, Robitussin as needed.   - Appreciate PCCM input  * Hypertension.  Hold hypertension medication due to low side blood pressure.  * Hyponatremia and dehydration.  Hold Lasix and monitor Na with normal saline IV.  * Leukocytosis.  Unclear etiology.  Possible due to home prednisone.  Follow-up UA and CBC.  * History of chronic diastolic CHF.  Stable.  Hold Lasix due to dehydration and low side blood pressure.  * Anxiety/Depression:  - continue prozac, trazodone and prn xanax - c/s psych for eval and meds adjustment if need  * h/o recurrent syncope in 2017 - await PT eval  My preference is to get this patient safely home with HHPT and other services if possible. Patient in agreement.   All the records are reviewed and case discussed with Care Management/Social Worker. Management plans discussed with the patient, nursing and they are in agreement.  CODE STATUS: DNR  TOTAL TIME  TAKING CARE OF THIS PATIENT: 35 minutes.   More than 50% of the time was spent in counseling/coordination of care: YES  POSSIBLE D/C IN 1-2 DAYS, DEPENDING ON CLINICAL CONDITION.   Max Sane M.D on 12/24/2017 at 6:44 PM  Between 7am to 6pm - Pager - [phone]  After 6pm go to [email or web address] - Proofreader  [facility]  Office  [phone]  CC: Primary care physician; Jodi Marble, MD  Note: This dictation was prepared with Dragon dictation along with smaller phrase technology. Any transcriptional errors that result from this process are unintentional.

## 2017-12-24 NOTE — Consult Note (Signed)
[facility] Consultation      Assessment and Plan:  Acute hypoxic and hypercapnic on chronic respiratory failure. Acute exacerbation of COPD with acute bronchitis.  - Can change to PO steroids.  -Azithromycin for bronchitis. -Duo nebs -BiPAP nightly while the patient is admitted to the hospital.  --Now down to 2L [place].  Can DC with oral steroid taper, follow up outpatient in 2-4 weeks.    Date: 12/24/2017  MRN# [id number] Debra [name] [birth year]  Referring Physician: Dr. Bridgett Larsson.   Debra MUSOLINO is a 84 y.o. old female seen in consultation for chief complaint of: dyspnea.     HPI:   The patient is a 84 year old female with a history of severe COPD, oxygen dependent.  She has been seen in my office for history of COPD with recurrent exacerbations, as well as chronic systolic congestive heart failure, nicotine abuse.  She has had multiple admissions to the hospital so far this year for cardiac and pulmonary issues, including COPD and CHF exacerbation. She returned to the hospital yesterday for progressive shortness of breath.  She was initially given IV Solu-Medrol, transferred to the ICU and started on BiPAP.  She noted that treatment with BiPAP appeared to help with her breathing.  She feels that her breathing is improving but is still short of breath. Metabolic panel continues to show elevated carbon dioxide levels consistent with chronic hypercapnia.  Imaging personally reviewed, chest x-ray 12/22/17, shows severe hyperinflation consistent with emphysema, mild pulmonary edema. Medication:    Current Facility-Administered Medications:  .  0.9 %  sodium chloride infusion, , Intravenous, Continuous, Demetrios Loll, MD, Last Rate: 50 mL/hr at 12/24/17 1211 .  acetaminophen (TYLENOL) tablet 650 mg, 650 mg, Oral, Q6H PRN, 650 mg at 12/23/17 1019 **OR** acetaminophen (TYLENOL) suppository 650 mg, 650 mg, Rectal, Q6H PRN, Demetrios Loll, MD .  albuterol (PROVENTIL) (2.5  MG/3ML) 0.083% nebulizer solution 2.5 mg, 2.5 mg, Nebulization, Q2H PRN, Demetrios Loll, MD, 2.5 mg at 12/23/17 1525 .  albuterol (PROVENTIL) (2.5 MG/3ML) 0.083% nebulizer solution 2.5 mg, 2.5 mg, Nebulization, TID, Demetrios Loll, MD, 2.5 mg at 12/24/17 1327 .  ALPRAZolam Duanne Moron) tablet 0.5 mg, 0.5 mg, Oral, BID PRN, Demetrios Loll, MD .  aspirin chewable tablet 81 mg, 81 mg, Oral, Beaulah Dinning, MD, 81 mg at 12/24/17 0759 .  atorvastatin (LIPITOR) tablet 10 mg, 10 mg, Oral, QPM, Demetrios Loll, MD, 10 mg at 12/23/17 1731 .  azithromycin [facility]) tablet 250 mg, 250 mg, Oral, Daily, Max Sane, MD, 250 mg at 12/24/17 0939 .  benzonatate (TESSALON) capsule 100 mg, 100 mg, Oral, TID PRN, Demetrios Loll, MD .  bisacodyl (DULCOLAX) EC tablet 5 mg, 5 mg, Oral, Daily PRN, Demetrios Loll, MD .  clopidogrel (PLAVIX) tablet 75 mg, 75 mg, Oral, Beaulah Dinning, MD, 75 mg at 12/24/17 0758 .  enoxaparin (LOVENOX) injection 40 mg, 40 mg, Subcutaneous, Q24H, Demetrios Loll, MD, 40 mg at 12/23/17 2105 .  FLUoxetine (PROZAC) capsule 20 mg, 20 mg, Oral, Daily, Demetrios Loll, MD, 20 mg at 12/24/17 4259 .  furosemide (LASIX) tablet 40 mg, 40 mg, Oral, Daily, Max Sane, MD, 40 mg at 12/24/17 0938 .  guaiFENesin (ROBITUSSIN) 100 MG/5ML solution 100 mg, 5 mL, Oral, Q4H PRN, Demetrios Loll, MD, 100 mg at 12/23/17 2106 .  HYDROcodone-acetaminophen (NORCO/VICODIN) 5-325 MG per tablet 1 tablet, 1 tablet, Oral, Q6H PRN, Manuella Ghazi, Vipul, MD .  latanoprost (XALATAN) 0.005 % ophthalmic solution 1 drop, 1 drop, Both Eyes, QHS,  Demetrios Loll, MD, 1 drop at 12/23/17 2152 .  losartan (COZAAR) tablet 25 mg, 25 mg, Oral, Daily, Manuella Ghazi, Vipul, MD .  methylPREDNISolone sodium succinate (SOLU-MEDROL) 40 mg/mL injection 40 mg, 40 mg, Intravenous, Q8H, [place] Sarinana, MD, 40 mg at 12/24/17 1434 .  ondansetron (ZOFRAN) tablet 4 mg, 4 mg, Oral, Q6H PRN **OR** ondansetron (ZOFRAN) injection 4 mg, 4 mg, Intravenous, Q6H PRN, Demetrios Loll, MD, 4 mg at 12/24/17 [id number] .   pantoprazole (PROTONIX) EC tablet 40 mg, 40 mg, Oral, Beaulah Dinning, MD, 40 mg at 12/24/17 0759 .  [START ON 12/25/2017] pneumococcal 23 valent vaccine (PNU-IMMUNE) injection 0.5 mL, 0.5 mL, Intramuscular, Tomorrow-1000, Shah, Vipul, MD .  senna-docusate (Senokot-S) tablet 1 tablet, 1 tablet, Oral, QHS PRN, Demetrios Loll, MD .  sodium chloride flush (NS) 0.9 % injection 3 mL, 3 mL, Intravenous, Q12H, Shah, Vipul, MD .  sodium chloride flush (NS) 0.9 % injection 3 mL, 3 mL, Intravenous, Q12H, Max Sane, MD, 3 mL at 12/24/17 0937 .  sodium chloride flush (NS) 0.9 % injection 3 mL, 3 mL, Intravenous, PRN, Manuella Ghazi, Vipul, MD .  tiotropium (SPIRIVA) inhalation capsule 18 mcg, 18 mcg, Inhalation, q morning - 10a, Demetrios Loll, MD, 18 mcg at 12/24/17 0940 .  tiZANidine (ZANAFLEX) tablet 2 mg, 2 mg, Oral, TID PRN, Demetrios Loll, MD, 2 mg at 12/24/17 0759 .  traZODone (DESYREL) tablet 50 mg, 50 mg, Oral, QHS, Demetrios Loll, MD, 50 mg at 12/23/17 2105   Allergies:  Codeine  Review of Systems: Gen:  Denies  fever, sweats, chills HEENT: Denies blurred vision, double vision. bleeds, sore throat Cvc:  No dizziness, chest pain. Resp:   Denies cough or sputum production. Gi: Denies swallowing difficulty, stomach pain. Gu:  Denies bladder incontinence, burning urine Ext:   No Joint pain, stiffness. Skin: No skin rash,  hives  Endoc:  No polyuria, polydipsia. Psych: No depression, insomnia. Other:  All other systems were reviewed with the patient and were negative other that what is mentioned in the HPI.   Physical Examination:   VS: BP (!) 166/70 (BP Location: Left Arm)   Pulse 71   Temp 97.7 F (36.5 C)   Resp 18   Ht 5\' 4"  (1.626 m)   Wt 169 lb 15.6 oz (77.1 kg)   SpO2 96%   BMI 29.18 kg/m   General Appearance: No distress  Neuro:without focal findings,  speech normal,  HEENT: PERRLA, EOM intact.   Pulmonary: normal breath sounds, scattered bilateral wheezing.  CardiovascularNormal S1,S2.  No  m/r/g.   Abdomen: Benign, Soft, non-tender. Renal:  No costovertebral tenderness  GU:  No performed at this time. Endoc: No evident thyromegaly, no signs of acromegaly. Skin:   warm, no rashes, no ecchymosis  Extremities: normal, no cyanosis, clubbing.  Other findings:    LABORATORY PANEL:   CBC Recent Labs  Lab 12/24/17 0729  WBC 18.2*  HGB 11.1*  HCT 32.9*  PLT 346   ------------------------------------------------------------------------------------------------------------------  Chemistries  Recent Labs  Lab 12/22/17 1305  12/24/17 0729  NA 133*   < > 134*  K 3.9   < > 4.4  CL 95*   < > 100*  CO2 30   < > 29  GLUCOSE 122*   < > 155*  BUN 29*   < > 29*  CREATININE 1.14*   < > 0.90  CALCIUM 8.2*   < > 8.6*  MG 1.8  --   --   AST 23  --   --  ALT 12*  --   --   ALKPHOS 62  --   --   BILITOT 0.9  --   --    < > = values in this interval not displayed.   ------------------------------------------------------------------------------------------------------------------  Cardiac Enzymes Recent Labs  Lab 12/22/17 1305  TROPONINI <0.03   ------------------------------------------------------------  RADIOLOGY:  No results found.     Thank  you for the consultation and for allowing [facility] to assist in the care of your patient. Our recommendations are noted above.  Please contact us if we can be of further service.   Marda Stalker, MD.  Board Certified in Internal Medicine, Pulmonary Medicine, [place], and Sleep Medicine.  [facility] Office Number: [phone]  Patricia Pesa, M.D.  Merton Border, M.D  12/24/2017

## 2017-12-24 NOTE — Progress Notes (Signed)
PT Cancellation Note  Patient Details Name: Debra [name] MRN: [id number] DOB: June 16, 1933   Cancelled Treatment:    Reason Eval/Treat Not Completed: Fatigue/lethargy limiting ability to participate(Consult received and chart reviewed.  Patient sleeping upon arrival to room, but awakenes to voice/light touch.  Reports not sleeping well last night and requests therapist allow her to rest, return at later time/date.  Will continue efforts as appropriate.)   Van Seymore H. Owens Shark, PT, DPT, NCS 12/24/17, 10:17 AM [phone]

## 2017-12-24 NOTE — Progress Notes (Signed)
Patient has not been out of bed since admission.  She says she is very weak and unable to do so.  She is reluctant to try.  PT evaluated ordered.

## 2017-12-25 DIAGNOSIS — F451 Undifferentiated somatoform disorder: Secondary | ICD-10-CM

## 2017-12-25 DIAGNOSIS — F331 Major depressive disorder, recurrent, moderate: Secondary | ICD-10-CM

## 2017-12-25 DIAGNOSIS — F4322 Adjustment disorder with anxiety: Secondary | ICD-10-CM

## 2017-12-25 LAB — BASIC METABOLIC PANEL
Anion gap: 2 — ABNORMAL LOW (ref 5–15)
BUN: 32 mg/dL — ABNORMAL HIGH (ref 6–20)
CO2: 36 mmol/L — ABNORMAL HIGH (ref 22–32)
Calcium: 8.5 mg/dL — ABNORMAL LOW (ref 8.9–10.3)
Chloride: 99 mmol/L — ABNORMAL LOW (ref 101–111)
Creatinine, Ser: 1.07 mg/dL — ABNORMAL HIGH (ref 0.44–1.00)
GFR calc Af Amer: 54 mL/min — ABNORMAL LOW (ref >60)
GFR, EST NON AFRICAN AMERICAN: 47 mL/min — AB (ref >60)
GLUCOSE: 141 mg/dL — AB (ref 65–99)
POTASSIUM: 5.5 mmol/L — AB (ref 3.5–5.1)
Sodium: 137 mmol/L (ref 135–145)

## 2017-12-25 LAB — CBC
HCT: 33 % — ABNORMAL LOW (ref 35.0–47.0)
Hemoglobin: 11.3 g/dL — ABNORMAL LOW (ref 12.0–16.0)
MCH: 34.9 pg — ABNORMAL HIGH (ref 26.0–34.0)
MCHC: 34.2 g/dL (ref 32.0–36.0)
MCV: 102.1 fL — AB (ref 80.0–100.0)
PLATELETS: 324 10*3/uL (ref 150–440)
RBC: 3.23 MIL/uL — AB (ref 3.80–5.20)
RDW: 15 % — AB (ref 11.5–14.5)
WBC: 13.2 10*3/uL — ABNORMAL HIGH (ref 3.6–11.0)

## 2017-12-25 LAB — POTASSIUM: Potassium: 4.5 mmol/L (ref 3.5–5.1)

## 2017-12-25 MED ORDER — CELECOXIB 200 MG PO CAPS
200.0000 mg | ORAL_CAPSULE | Freq: Two times a day (BID) | ORAL | Status: DC
  Administered 2017-12-25 – 2017-12-28 (×7): 200 mg via ORAL
  Filled 2017-12-25 (×8): qty 1

## 2017-12-25 MED ORDER — IPRATROPIUM-ALBUTEROL 0.5-2.5 (3) MG/3ML IN SOLN
3.0000 mL | Freq: Three times a day (TID) | RESPIRATORY_TRACT | Status: DC
  Administered 2017-12-26 – 2017-12-28 (×7): 3 mL via RESPIRATORY_TRACT
  Filled 2017-12-25 (×8): qty 3

## 2017-12-25 MED ORDER — DOCUSATE SODIUM 100 MG PO CAPS
100.0000 mg | ORAL_CAPSULE | Freq: Every day | ORAL | Status: DC | PRN
Start: 2017-12-25 — End: 2017-12-28

## 2017-12-25 MED ORDER — FUROSEMIDE 10 MG/ML IJ SOLN
40.0000 mg | Freq: Once | INTRAMUSCULAR | Status: AC
  Administered 2017-12-25: 40 mg via INTRAVENOUS
  Filled 2017-12-25: qty 4

## 2017-12-25 MED ORDER — ALPRAZOLAM 0.5 MG PO TABS
0.5000 mg | ORAL_TABLET | Freq: Two times a day (BID) | ORAL | Status: DC
  Administered 2017-12-25 – 2017-12-28 (×7): 0.5 mg via ORAL
  Filled 2017-12-25 (×7): qty 1

## 2017-12-25 MED ORDER — FLUOXETINE HCL 20 MG PO CAPS
30.0000 mg | ORAL_CAPSULE | Freq: Every day | ORAL | Status: DC
  Filled 2017-12-25: qty 1

## 2017-12-25 MED ORDER — METHYLPREDNISOLONE SODIUM SUCC 40 MG IJ SOLR
40.0000 mg | Freq: Two times a day (BID) | INTRAMUSCULAR | Status: DC
  Administered 2017-12-26 (×2): 40 mg via INTRAVENOUS
  Filled 2017-12-25 (×2): qty 1

## 2017-12-25 NOTE — Progress Notes (Signed)
Talked to Dr. Manuella Ghazi about patient's potassium 5.5 this morning and was not address this morning. Physical therapy would not ambulate patient due to her potassium level. Order for stat potassium to be check. No other concern at the moment. RN will continue to monitor.

## 2017-12-25 NOTE — Care Management Important Message (Signed)
Copy of signed IM left in patient's room.    

## 2017-12-25 NOTE — Plan of Care (Signed)
  Problem: Safety: Goal: Ability to remain free from injury will improve Outcome: Progressing Note:  Fall precautions in place, non skid socks when oob   Problem: Clinical Measurements: Goal: Will remain free from infection Outcome: Not Progressing Note:  Remains on po antibiotics Goal: Diagnostic test results will improve Outcome: Not Progressing Note:  K 5.5, WBC 13.2 BUN 23/1.07 Goal: Respiratory complications will improve Outcome: Not Progressing Note:  2L Oxygen at this time, remains on nebs, IV steroids

## 2017-12-25 NOTE — Progress Notes (Signed)
Family Meeting Note  Advance Directive:yes  Today a meeting took place with the Patient and Daughter at bedside.  The following clinical team members were present during this meeting:MD  The following were discussed:Patient's diagnosis:    84 y.o. female with a known history of COPD, CHF, hypertension and MI admitted for Acute on chronic respiratory failure with hypoxia due to COPD exacerbation  Patient's progosis: > 12 months and Goals for treatment: DNR  Additional follow-up to be provided: Consider Palliative care services at home, HHPT  Time spent during discussion:20 minutes  Max Sane, MD

## 2017-12-25 NOTE — Progress Notes (Signed)
Debra [name] at Debra [name] NAME: Debra [name]    MR#:  [id number]  DATE OF BIRTH:  [birth year]  SUBJECTIVE:  CHIEF COMPLAINT:  No chief complaint on file. seems anxious, still c/o SOB REVIEW OF SYSTEMS:  Review of Systems  Constitutional: Negative for chills, fever and weight loss.  HENT: Negative for nosebleeds and sore throat.   Eyes: Negative for blurred vision.  Respiratory: Positive for shortness of breath. Negative for cough and wheezing.   Cardiovascular: Negative for chest pain, orthopnea, leg swelling and PND.  Gastrointestinal: Negative for abdominal pain, constipation, diarrhea, heartburn, nausea and vomiting.  Genitourinary: Negative for dysuria and urgency.  Musculoskeletal: Negative for back pain.  Skin: Negative for rash.  Neurological: Negative for dizziness, speech change, focal weakness and headaches.  Endo/Heme/Allergies: Does not bruise/bleed easily.  Psychiatric/Behavioral: Negative for depression.   DRUG ALLERGIES:   Allergies  Allergen Reactions  . Codeine Itching and Rash   VITALS:  Blood pressure 136/74, pulse 75, temperature (!) 97.5 F (36.4 C), temperature source Oral, resp. rate 18, height 5\' 4"  (1.626 m), weight 76.2 kg (167 lb 14.4 oz), SpO2 92 %. PHYSICAL EXAMINATION:  Physical Exam  Constitutional: She is oriented to person, place, and time.  HENT:  Head: Normocephalic and atraumatic.  Eyes: Pupils are equal, round, and reactive to light. Conjunctivae and EOM are normal.  Neck: Normal range of motion. Neck supple. No tracheal deviation present. No thyromegaly present.  Cardiovascular: Normal rate, regular rhythm and normal heart sounds.  Pulmonary/Chest: Effort normal and breath sounds normal. No respiratory distress. She has no wheezes. She exhibits no tenderness.  Abdominal: Soft. Bowel sounds are normal. She exhibits no distension. There is no tenderness.  Musculoskeletal: Normal range of motion.    Neurological: She is alert and oriented to person, place, and time. No cranial nerve deficit.  Skin: Skin is warm and dry. No rash noted.  Psychiatric: Her mood appears anxious. She exhibits a depressed mood.   LABORATORY PANEL:  Female CBC Recent Labs  Lab 12/25/17 0436  WBC 13.2*  HGB 11.3*  HCT 33.0*  PLT 324   ------------------------------------------------------------------------------------------------------------------ Chemistries  Recent Labs  Lab 12/22/17 1305  12/25/17 0436 12/25/17 1856  NA 133*   < > 137  --   K 3.9   < > 5.5* 4.5  CL 95*   < > 99*  --   CO2 30   < > 36*  --   GLUCOSE 122*   < > 141*  --   BUN 29*   < > 32*  --   CREATININE 1.14*   < > 1.07*  --   CALCIUM 8.2*   < > 8.5*  --   MG 1.8  --   --   --   AST 23  --   --   --   ALT 12*  --   --   --   ALKPHOS 62  --   --   --   BILITOT 0.9  --   --   --    < > = values in this interval not displayed.   RADIOLOGY:  No results found. ASSESSMENT AND PLAN:   * Acute on chronic respiratory failure with hypoxia due to COPD exacerbation. - wean O2 as tolerated, still on 2-3 liters O2 - BiPAP prn - continue Albuterol every 6 hours, IV Solu-Medrol every 12 hours (tapering from Q 8 hrs), Robitussin as needed.   -  Appreciate PCCM input  * Hyperkalemia: K 5.5 - give extra lasix 40 mg STAT, recheck K 4.5  * Hypertension.  Hold hypertension medication due to low side/normal blood pressure.  * Hyponatremia and dehydration: resolve. Resumed lasix 40 mg daily  * Leukocytosis.  Unclear etiology.  Possible due to home prednisone.  Follow-up UA and CBC.  * History of chronic diastolic CHF.  Stable.  Hold Lasix due to dehydration and low side blood pressure.  * Anxiety/Major Depression:  - Increase prozac to 30 mg daily, trazodone  - Xanax changed to scheduled dose per psych - Appreciate Psych assistance  * h/o recurrent syncope in 2017 - Avoid controlling BP too tight as prone for  orthostasis     My preference is to get this patient safely home with HHPT and other services if possible. Daughter wants her to be placed as she is planning to go [place]. Patient upset.    All the records are reviewed and case discussed with Care Management/Social Worker. Management plans discussed with the patient, Daughter, Debra [name] daughter at bedside and they are in agreement.  CODE STATUS: DNR  TOTAL TIME TAKING CARE OF THIS PATIENT: 35 minutes.   More than 50% of the time was spent in counseling/coordination of care: YES  POSSIBLE D/C IN 1-2 DAYS, DEPENDING ON CLINICAL CONDITION.   Debra [name] M.D on 12/25/2017 at 8:15 PM  Between 7am to 6pm - Pager - [phone]  After 6pm go to [email or web address] - Proofreader  [facility]  Office  [phone]  CC: Primary care physician; Debra Marble, MD  Note: This dictation was prepared with Dragon dictation along with smaller phrase technology. Any transcriptional errors that result from this process are unintentional.

## 2017-12-25 NOTE — Consult Note (Signed)
[facility] Face-to-Face Psychiatry Consult   Reason for Consult: Consult for this 84 year old woman who has congestive heart failure and COPD.  Concern about anxiety and depression Referring Physician:  Manuella Ghazi Patient Identification: Debra [name] MRN:  [id number] Principal Diagnosis: Moderate recurrent major depression ([place]) Diagnosis:   Patient Active Problem List   Diagnosis Date Noted  . Moderate recurrent major depression ([place]) [F33.1] 12/25/2017  . Acute adjustment disorder with anxiety [F43.22] 12/25/2017  . Somatic symptom disorder [F45.1] 12/25/2017  . Acute respiratory failure with hypoxia ([place]) [J96.01] 12/22/2017  . CHF (congestive heart failure) ([place]) [I50.9] 12/05/2016  . CAP (community acquired pneumonia) [J18.9] 10/05/2016  . Chronic systolic heart failure ([place]) [I50.22] 06/28/2016  . COPD (chronic obstructive pulmonary disease) with emphysema ([place]) [J43.9] 06/28/2016  . Tobacco use [Z72.0] 06/28/2016  . Acidosis [E87.2] 09/14/2015  . Hypotension [I95.9] 09/14/2015  . Hypokalemia [E87.6] 09/14/2015  . COPD exacerbation ([place]) [J44.1] 09/01/2015  . Sepsis ([place]) [A41.9] 08/27/2015    Total Time spent with patient: 1 hour  Subjective:   Debra [name] is a 84 y.o. female patient admitted with "I am not doing good".  HPI: Patient seen chart reviewed.  84 year old woman in the hospital with COPD and congestive heart failure.  Patient tells me that she feels like she is still very sick and does not feel ready to go home.  She is upset that she feels like she is being discharged home tomorrow inappropriately.  She says that she feels like she needs to go to rehab instead.  She is worried about her home situation.  She is not able to move around inside the house very much even with her oxygen and she says that her granddaughter who lives with her does not really do much to take care of her.  Patient says her mood is mostly nervous.  She does have some trouble sleeping at night both  here and at home.  Appetite is okay.  Patient says she has long been wishing that she would die because of the pain that she is in but she absolutely denies any thought of ever trying to kill herself.  Denies any psychotic symptoms.  Patient does not appear to have a significant degree of dementia.  She is on fluoxetine 20 mg at home and also takes Xanax 1/2 mg twice a day as needed usually taking at least 1 of them a day.  Social history: Her granddaughter lives with her.  Patient says that her granddaughter is on drugs and does not really do a very good job taking care of her.  Medical history: Congestive heart failure COPD history of myocardial infarction and bypass.  Substance abuse history: Does not drink no history noted of any drug abuse concerns  Past Psychiatric History: Patient has never seen a psychiatrist or therapist that she knows of.  No history of hospitalization.  No history of suicide attempts.  Has been prescribed Prozac chronically.  She is not even aware of it probably been there for years.  Xanax as needed looks like it gets filled regularly every month.  She thinks that she takes it at least once a day.  Risk to Self: Is patient at risk for suicide?: No Risk to Others:   Prior Inpatient Therapy:   Prior Outpatient Therapy:    Past Medical History:  Past Medical History:  Diagnosis Date  . Asthma   . CHF (congestive heart failure) ([place])   . COPD (chronic obstructive pulmonary disease) ([place])   .  Myocardial infarction [facility])     Past Surgical History:  Procedure Laterality Date  . ABDOMINAL HYSTERECTOMY    . BREAST SURGERY    . BYPASS GRAFT  2007   triple  . CHOLECYSTECTOMY    . CORONARY ARTERY BYPASS GRAFT    . TONSILLECTOMY     Family History:  Family History  Problem Relation Age of Onset  . CAD Father    Family Psychiatric  History: She says several members of her family have drug abuse problems Social History:  Social History   Substance and Sexual  Activity  Alcohol Use No     Social History   Substance and Sexual Activity  Drug Use No    Social History   Socioeconomic History  . Marital status: Widowed    Spouse name: Not on file  . Number of children: Not on file  . Years of education: Not on file  . Highest education level: Not on file  Occupational History  . Not on file  Social Needs  . Financial resource strain: Not on file  . Food insecurity:    Worry: Not on file    Inability: Not on file  . Transportation needs:    Medical: Not on file    Non-medical: Not on file  Tobacco Use  . Smoking status: Former Smoker    Packs/day: 0.50    Years: 55.00    Pack years: 27.50    Last attempt to quit: 11/21/2017    Years since quitting: 0.0  . Smokeless tobacco: Never Used  Substance and Sexual Activity  . Alcohol use: No  . Drug use: No  . Sexual activity: Not on file  Lifestyle  . Physical activity:    Days per week: Not on file    Minutes per session: Not on file  . Stress: Not on file  Relationships  . Social connections:    Talks on phone: Not on file    Gets together: Not on file    Attends religious service: Not on file    Active member of club or organization: Not on file    Attends meetings of clubs or organizations: Not on file    Relationship status: Not on file  Other Topics Concern  . Not on file  Social History Narrative  . Not on file   Additional Social History:    Allergies:   Allergies  Allergen Reactions  . Codeine Itching and Rash    Labs:  Results for orders placed or performed during the hospital encounter of 12/22/17 (from the past 48 hour(s))  CBC     Status: Abnormal   Collection Time: 12/24/17  7:29 AM  Result Value Ref Range   WBC 18.2 (H) 3.6 - 11.0 K/uL   RBC 3.23 (L) 3.80 - 5.20 MIL/uL   Hemoglobin 11.1 (L) 12.0 - 16.0 g/dL   HCT 32.9 (L) 35.0 - 47.0 %   MCV 101.8 (H) 80.0 - 100.0 fL   MCH 34.3 (H) 26.0 - 34.0 pg   MCHC 33.7 32.0 - 36.0 g/dL   RDW 15.4 (H) 11.5  - 14.5 %   Platelets 346 150 - 440 K/uL    Comment: Performed at [facility], [address] [place], [place] [address]  Basic metabolic panel     Status: Abnormal   Collection Time: 12/24/17  7:29 AM  Result Value Ref Range   Sodium 134 (L) 135 - 145 mmol/L   Potassium 4.4 3.5 - 5.1  mmol/L   Chloride 100 (L) 101 - 111 mmol/L   CO2 29 22 - 32 mmol/L   Glucose, Bld 155 (H) 65 - 99 mg/dL   BUN 29 (H) 6 - 20 mg/dL   Creatinine, Ser 0.90 0.44 - 1.00 mg/dL   Calcium 8.6 (L) 8.9 - 10.3 mg/dL   GFR calc non Af Amer 58 (L) >60 mL/min   GFR calc Af Amer >60 >60 mL/min    Comment: (NOTE) The eGFR has been calculated using the CKD EPI equation. This calculation has not been validated in all clinical situations. eGFR's persistently <60 mL/min signify possible Chronic Kidney Disease.    Anion gap 5 5 - 15    Comment: Performed at [facility], [address] [place], [place] [address]  CBC     Status: Abnormal   Collection Time: 12/25/17  4:36 AM  Result Value Ref Range   WBC 13.2 (H) 3.6 - 11.0 K/uL   RBC 3.23 (L) 3.80 - 5.20 MIL/uL   Hemoglobin 11.3 (L) 12.0 - 16.0 g/dL   HCT 33.0 (L) 35.0 - 47.0 %   MCV 102.1 (H) 80.0 - 100.0 fL   MCH 34.9 (H) 26.0 - 34.0 pg   MCHC 34.2 32.0 - 36.0 g/dL   RDW 15.0 (H) 11.5 - 14.5 %   Platelets 324 150 - 440 K/uL    Comment: Performed at [facility], [address] [place], [place] [address]  Basic metabolic panel     Status: Abnormal   Collection Time: 12/25/17  4:36 AM  Result Value Ref Range   Sodium 137 135 - 145 mmol/L    Comment: REPEATED ELECTROLYTES SDR   Potassium 5.5 (H) 3.5 - 5.1 mmol/L   Chloride 99 (L) 101 - 111 mmol/L   CO2 36 (H) 22 - 32 mmol/L   Glucose, Bld 141 (H) 65 - 99 mg/dL   BUN 32 (H) 6 - 20 mg/dL   Creatinine, Ser 1.07 (H) 0.44 - 1.00 mg/dL   Calcium 8.5 (L) 8.9 - 10.3 mg/dL   GFR calc non Af Amer 47 (L) >60 mL/min   GFR calc Af Amer 54 (L) >60 mL/min    Comment: (NOTE) The eGFR has  been calculated using the CKD EPI equation. This calculation has not been validated in all clinical situations. eGFR's persistently <60 mL/min signify possible Chronic Kidney Disease.    Anion gap 2 (L) 5 - 15    Comment: Performed at [facility], [address] [place], [place] [address]    Current Facility-Administered Medications  Medication Dose Route Frequency Provider Last Rate Last Dose  . acetaminophen (TYLENOL) tablet 650 mg  650 mg Oral Q6H PRN Demetrios Loll, MD   650 mg at 12/23/17 1019   Or  . acetaminophen (TYLENOL) suppository 650 mg  650 mg Rectal Q6H PRN Demetrios Loll, MD      . albuterol (PROVENTIL) (2.5 MG/3ML) 0.083% nebulizer solution 2.5 mg  2.5 mg Nebulization Q2H PRN Demetrios Loll, MD   2.5 mg at 12/23/17 1525  . albuterol (PROVENTIL) (2.5 MG/3ML) 0.083% nebulizer solution 2.5 mg  2.5 mg Nebulization TID Demetrios Loll, MD   2.5 mg at 12/25/17 1305  . ALPRAZolam Duanne Moron) tablet 0.5 mg  0.5 mg Oral BID Ayerim Berquist T, MD      . aspirin chewable tablet 81 mg  81 mg Oral Inez Catalina, Sheppard Evens, MD   81 mg at 12/25/17 0941  . atorvastatin (LIPITOR) tablet 10 mg  10 mg Oral QPM Demetrios Loll, MD   10 mg at 12/24/17 1805  . azithromycin [facility]) tablet 250 mg  250 mg Oral Daily Max Sane, MD   250 mg at 12/25/17 0941  . benzonatate (TESSALON) capsule 100 mg  100 mg Oral TID PRN Demetrios Loll, MD      . bisacodyl (DULCOLAX) EC tablet 5 mg  5 mg Oral Daily PRN Demetrios Loll, MD      . celecoxib (CELEBREX) capsule 200 mg  200 mg Oral BID Max Sane, MD   200 mg at 12/25/17 1336  . clopidogrel (PLAVIX) tablet 75 mg  75 mg Oral Inez Catalina, Sheppard Evens, MD   75 mg at 12/25/17 0941  . docusate sodium (COLACE) capsule 100 mg  100 mg Oral Daily PRN Max Sane, MD      . enoxaparin (LOVENOX) injection 40 mg  40 mg Subcutaneous Q24H Demetrios Loll, MD   40 mg at 12/24/17 2050  . [START ON 12/26/2017] FLUoxetine (PROZAC) capsule 30 mg  30 mg Oral Daily Abubakar Crispo T, MD      . furosemide (LASIX)  tablet 40 mg  40 mg Oral Daily Max Sane, MD   40 mg at 12/25/17 0941  . guaiFENesin (ROBITUSSIN) 100 MG/5ML solution 100 mg  5 mL Oral Q4H PRN Demetrios Loll, MD   100 mg at 12/24/17 2050  . HYDROcodone-acetaminophen (NORCO/VICODIN) 5-325 MG per tablet 1 tablet  1 tablet Oral Q6H PRN Max Sane, MD   1 tablet at 12/24/17 2050  . latanoprost (XALATAN) 0.005 % ophthalmic solution 1 drop  1 drop Both Eyes QHS Demetrios Loll, MD   1 drop at 12/24/17 2051  . methylPREDNISolone sodium succinate (SOLU-MEDROL) 40 mg/mL injection 40 mg  40 mg Intravenous Q8H Laverle Hobby, MD   40 mg at 12/25/17 1307  . ondansetron (ZOFRAN) tablet 4 mg  4 mg Oral Q6H PRN Demetrios Loll, MD       Or  . ondansetron [facility]) injection 4 mg  4 mg Intravenous Q6H PRN Demetrios Loll, MD   4 mg at 12/24/17 [phone]  . pantoprazole (PROTONIX) EC tablet 40 mg  40 mg Oral Inez Catalina, Sheppard Evens, MD   40 mg at 12/25/17 0941  . senna-docusate (Senokot-S) tablet 1 tablet  1 tablet Oral QHS PRN Demetrios Loll, MD      . sodium chloride flush (NS) 0.9 % injection 3 mL  3 mL Intravenous Q12H Max Sane, MD   3 mL at 12/24/17 2051  . sodium chloride flush (NS) 0.9 % injection 3 mL  3 mL Intravenous Q12H Max Sane, MD   3 mL at 12/25/17 0942  . sodium chloride flush (NS) 0.9 % injection 3 mL  3 mL Intravenous PRN Max Sane, MD      . tiotropium (SPIRIVA) inhalation capsule 18 mcg  18 mcg Inhalation q morning - 10a Demetrios Loll, MD   18 mcg at 12/25/17 0940  . tiZANidine (ZANAFLEX) tablet 2 mg  2 mg Oral TID PRN Demetrios Loll, MD   2 mg at 12/24/17 0759  . traZODone (DESYREL) tablet 50 mg  50 mg Oral QHS Demetrios Loll, MD   50 mg at 12/24/17 2050    Musculoskeletal: Strength & Muscle Tone: decreased Gait & Station: unsteady Patient leans: N/A  Psychiatric Specialty Exam: Physical Exam  Nursing note and vitals reviewed. Constitutional: She appears well-developed and well-nourished.  HENT:  Head: Normocephalic and atraumatic.  Eyes: Pupils are equal,  round, and reactive to light.  Conjunctivae are normal.  Neck: Normal range of motion.  Cardiovascular: Regular rhythm and normal heart sounds.  Respiratory: She is in respiratory distress.  GI: Soft.  Musculoskeletal: Normal range of motion.  Neurological: She is alert.  Skin: Skin is warm and dry.  Psychiatric: Her speech is normal and behavior is normal. Judgment normal. Her mood appears anxious. Cognition and memory are normal. She expresses suicidal ideation. She expresses no suicidal plans.    Review of Systems  Constitutional: Negative.   HENT: Negative.   Eyes: Negative.   Respiratory: Positive for shortness of breath.   Cardiovascular: Positive for chest pain.  Gastrointestinal: Negative.   Musculoskeletal: Negative.   Skin: Negative.   Neurological: Positive for weakness.  Psychiatric/Behavioral: Positive for depression and suicidal ideas. Negative for hallucinations, memory loss and substance abuse. The patient is nervous/anxious and has insomnia.     Blood pressure (!) 161/86, pulse 65, temperature 98.2 F (36.8 C), temperature source Oral, resp. rate 17, height '5\' 4"'  (1.626 m), weight 167 lb 14.4 oz (76.2 kg), SpO2 94 %.Body mass index is 28.82 kg/m.  General Appearance: Casual  Eye Contact:  Good  Speech:  Clear and Coherent  Volume:  Normal  Mood:  Anxious and Depressed  Affect:  Congruent  Thought Process:  Goal Directed  Orientation:  Full (Time, Place, and Person)  Thought Content:  Logical  Suicidal Thoughts:  Yes.  without intent/plan  Homicidal Thoughts:  No  Memory:  Immediate;   Fair Recent;   Fair Remote;   Fair  Judgement:  Fair  Insight:  Fair  Psychomotor Activity:  Decreased  Concentration:  Concentration: Fair  Recall:  [employer] of Knowledge:  Fair  Language:  Fair  Akathisia:  No  Handed:  Right  AIMS (if indicated):     Assets:  Desire for Improvement Housing Resilience  ADL's:  Impaired  Cognition:  WNL  Sleep:         Treatment Plan Summary: Medication management and Plan 84 year old woman who has symptoms of anxiety and depression.  Some of them can be thought of as being in reaction to her chronic medical conditions although she also meets criteria for major depression.  Patient has thoughts of wishing that the [facility] would take her but is absolutely adamant that she would never try to kill herself.  Patient is probably not at high risk of suicide and does not need psychiatric hospitalization.  I do think it would be worthwhile to pay attention to her mood and adjust her antidepressant.  I am increasing the dose of the Prozac to 30 mg a day.  Additionally she normally takes some of her Xanax at home and here in the hospital because it is listed as as needed has not gotten any in the last couple days.  I went ahead and changed it to a standing dose to see how she tolerates that.  If that makes her oversedated that would increase my concern that perhaps her medicine is being diverted at home but if we can get it to her here in the hospital may be that we will relieve some of the acute anxiety.  Finally, the patient absolutely insists that she is not safe to go home.  I see that physical therapy was consulted but was not able to complete her consultation yesterday.  They said in their note they will be back again today so we can hope that follows through.  Patient is requesting to go to rehab.  I am just passing this along to the treatment team.  I will follow up as needed.  Disposition: Patient does not meet criteria for psychiatric inpatient admission. Supportive therapy provided about ongoing stressors.  Alethia Berthold, MD 12/25/2017 3:19 PM

## 2017-12-25 NOTE — Progress Notes (Signed)
PT Cancellation Note  Patient Details Name: Debra [name] MRN: [id number] DOB: 1932/10/24   Cancelled Treatment:    Reason Eval/Treat Not Completed: Medical issues which prohibited therapy(Chart reviewed for re-attempt at evaluation.  Patient noted with critically elevated potassium levels; no orders/medications to address noted.  Discussed with primary RN who was to speak with attending MD.  Will continue efforts next date as medically appropriate.)  Nataniel Gasper H. Owens Shark, PT, DPT, NCS 12/25/17, 4:36 PM [phone]

## 2017-12-26 LAB — BASIC METABOLIC PANEL
Anion gap: 7 (ref 5–15)
BUN: 40 mg/dL — AB (ref 6–20)
CHLORIDE: 93 mmol/L — AB (ref 101–111)
CO2: 35 mmol/L — AB (ref 22–32)
Calcium: 8.6 mg/dL — ABNORMAL LOW (ref 8.9–10.3)
Creatinine, Ser: 1.29 mg/dL — ABNORMAL HIGH (ref 0.44–1.00)
GFR calc Af Amer: 43 mL/min — ABNORMAL LOW (ref >60)
GFR calc non Af Amer: 37 mL/min — ABNORMAL LOW (ref >60)
Glucose, Bld: 136 mg/dL — ABNORMAL HIGH (ref 65–99)
POTASSIUM: 5 mmol/L (ref 3.5–5.1)
SODIUM: 135 mmol/L (ref 135–145)

## 2017-12-26 LAB — CBC
HCT: 35.3 % (ref 35.0–47.0)
Hemoglobin: 12.1 g/dL (ref 12.0–16.0)
MCH: 35.3 pg — AB (ref 26.0–34.0)
MCHC: 34.4 g/dL (ref 32.0–36.0)
MCV: 102.8 fL — AB (ref 80.0–100.0)
Platelets: 325 10*3/uL (ref 150–440)
RBC: 3.44 MIL/uL — AB (ref 3.80–5.20)
RDW: 14.8 % — ABNORMAL HIGH (ref 11.5–14.5)
WBC: 10.7 10*3/uL (ref 3.6–11.0)

## 2017-12-26 MED ORDER — METHYLPREDNISOLONE SODIUM SUCC 40 MG IJ SOLR
40.0000 mg | INTRAMUSCULAR | Status: DC
  Administered 2017-12-27: 40 mg via INTRAVENOUS
  Filled 2017-12-26: qty 1

## 2017-12-26 MED ORDER — FLUOXETINE HCL 10 MG PO CAPS
30.0000 mg | ORAL_CAPSULE | Freq: Every day | ORAL | Status: DC
  Administered 2017-12-26 – 2017-12-28 (×3): 30 mg via ORAL
  Filled 2017-12-26 (×3): qty 3

## 2017-12-26 MED ORDER — HYDROCODONE-ACETAMINOPHEN 5-325 MG PO TABS
1.0000 | ORAL_TABLET | Freq: Four times a day (QID) | ORAL | Status: DC | PRN
  Administered 2017-12-26 – 2017-12-28 (×4): 1 via ORAL
  Filled 2017-12-26 (×4): qty 1

## 2017-12-26 NOTE — Evaluation (Signed)
Physical Therapy Evaluation Patient Details Name: Debra [name] MRN: [id number] DOB: 02-22-1933 Today's Date: 12/26/2017   History of Present Illness  presented to ER secondary to worsening SOB, wheezing; admitted with acute/chronic respiratory failure secondary to COPD exacerbation.  Clinical Impression  Upon evaluation, patient alert and oriented; follows all commands and demonstrates good effort with all functional activities.  Patient generally weak and deconditioned throughout all extremities due to acute illness (and limited/no OOB activities since hospitalization).  Currently requiring min assist for bed mobility; min assist for sit/stand, basic transfers and very short-distance gait (10').  Poor standing balance, poor activity tolerance; requires seated rest after 10' distance due to significant SOB, fatigue (required increase in supplemental O2 to 3L to maintain sats >89% with exertion).  Unable to demonstrate ability to safely and consistently complete household distances required to facilitate safe discharge home at this time.  Will continue to monitor and progress as tolerated throughout stay.  Did encourage patient and nursing to refrain from use of external catheter and encourage OOB to BSC/chair as tolerated for increased mobility outside of therapy time.  Patient voiced understanding. Would benefit from skilled PT to address above deficits and promote optimal return to PLOF; recommend transition to STR upon discharge from acute hospitalization.     Follow Up Recommendations SNF    Equipment Recommendations  Rolling walker with 5" wheels    Recommendations for Other Services       Precautions / Restrictions Precautions Precautions: Fall Restrictions Weight Bearing Restrictions: No      Mobility  Bed Mobility Overal bed mobility: Needs Assistance Bed Mobility: Supine to Sit     Supine to sit: Min assist        Transfers Overall transfer level: Needs  assistance Equipment used: Rolling walker (2 wheeled) Transfers: Sit to/from Stand Sit to Stand: Min assist            Ambulation/Gait Ambulation/Gait assistance: Min assist;+2 safety/equipment Ambulation Distance (Feet): 10 Feet Assistive device: Rolling walker (2 wheeled)       General Gait Details: forward flexed posture, slow and guarded; poor balance reactions, poor activity tolerance.  Unsafe to attempt without hands-on assist at all times.  Requires seated rest after 10' due to significant SOB, fatigue (sats 89% on 3L supplemental O2); BORG 6-7/10 after distance.  Stairs            Wheelchair Mobility    Modified Rankin (Stroke Patients Only)       Balance Overall balance assessment: Needs assistance Sitting-balance support: No upper extremity supported;Feet supported Sitting balance-Leahy Scale: Good     Standing balance support: Bilateral upper extremity supported Standing balance-Leahy Scale: Poor                               Pertinent Vitals/Pain Pain Assessment: Faces Faces Pain Scale: Hurts little more Pain Location: back Pain Descriptors / Indicators: Aching;Grimacing;Guarding Pain Intervention(s): Limited activity within patient's tolerance;Monitored during session;Repositioned    Home Living Family/patient expects to be discharged to:: Private residence Living Arrangements: Other relatives(granddaughter, available 24 hours/day) Available Help at Discharge: Family;Available 24 hours/day Type of Home: Mobile home Home Access: Ramped entrance     Home Layout: One level Home Equipment: [place] - 4 wheels;Bedside commode;Shower seat      Prior Function Level of Independence: Needs assistance         Comments: Mod indep for limited household distances with 4WRW; granddaughter assists with  ADLs, household chores/activity as needed.  Home O2 at 2L.  Denies fall history within previous six months.     Hand Dominance    Dominant Hand: Right    Extremity/Trunk Assessment   Upper Extremity Assessment Upper Extremity Assessment: Overall WFL for tasks assessed    Lower Extremity Assessment Lower Extremity Assessment: Generalized weakness(grossly 4-/5 throughout)       Communication   Communication: No difficulties  Cognition Arousal/Alertness: Awake/alert Behavior During Therapy: WFL for tasks assessed/performed Overall Cognitive Status: Within Functional Limits for tasks assessed                                        General Comments      Exercises     Assessment/Plan    PT Assessment Patient needs continued PT services  PT Problem List Decreased strength;Decreased activity tolerance;Decreased balance;Decreased mobility;Decreased coordination;Decreased knowledge of use of DME;Decreased safety awareness;Decreased knowledge of precautions;Cardiopulmonary status limiting activity       PT Treatment Interventions DME instruction;Gait training;Functional mobility training;Therapeutic activities;Therapeutic exercise;Balance training;Patient/family education    PT Goals (Current goals can be found in the Care Plan section)  Acute Rehab PT Goals Patient Stated Goal: to get my strength back PT Goal Formulation: With patient Time For Goal Achievement: 01/09/18 Potential to Achieve Goals: Good    Frequency Min 2X/week   Barriers to discharge        Co-evaluation               AM-PAC PT "6 Clicks" Daily Activity  Outcome Measure Difficulty turning over in bed (including adjusting bedclothes, sheets and blankets)?: Unable Difficulty moving from lying on back to sitting on the side of the bed? : Unable Difficulty sitting down on and standing up from a chair with arms (e.g., wheelchair, bedside commode, etc,.)?: Unable Help needed moving to and from a bed to chair (including a wheelchair)?: A Little Help needed walking in hospital room?: A Little Help needed climbing  3-5 steps with a railing? : A Lot 6 Click Score: 11    End of Session Equipment Utilized During Treatment: Gait belt;Oxygen Activity Tolerance: Patient limited by fatigue Patient left: in chair;with call bell/phone within reach;with chair alarm set Nurse Communication: Mobility status PT Visit Diagnosis: Muscle weakness (generalized) (M62.81);Difficulty in walking, not elsewhere classified (R26.2)    Time: 5974-1638 PT Time Calculation (min) (ACUTE ONLY): 27 min   Charges:   PT Evaluation $PT Eval Moderate Complexity: 1 Mod PT Treatments $Therapeutic Activity: 8-22 mins   PT G Codes:        Cloteal Isaacson H. Owens Shark, PT, DPT, NCS 12/26/17, 9:36 AM [phone]

## 2017-12-26 NOTE — Consult Note (Signed)
[place] [facility] Consult   Reason for Consult: Follow-up 84 year old woman with a history of depression Referring Physician:  Gouru Patient Identification: Debra [name] MRN:  [id number] Principal Diagnosis: Moderate recurrent major depression ([place]) Diagnosis:   Patient Active Problem List   Diagnosis Date Noted  . Moderate recurrent major depression ([place]) [F33.1] 12/25/2017  . Acute adjustment disorder with anxiety [F43.22] 12/25/2017  . Somatic symptom disorder [F45.1] 12/25/2017  . Acute respiratory failure with hypoxia ([place]) [J96.01] 12/22/2017  . CHF (congestive heart failure) ([place]) [I50.9] 12/05/2016  . CAP (community acquired pneumonia) [J18.9] 10/05/2016  . Chronic systolic heart failure ([place]) [I50.22] 06/28/2016  . COPD (chronic obstructive pulmonary disease) with emphysema ([place]) [J43.9] 06/28/2016  . Tobacco use [Z72.0] 06/28/2016  . Acidosis [E87.2] 09/14/2015  . Hypotension [I95.9] 09/14/2015  . Hypokalemia [E87.6] 09/14/2015  . COPD exacerbation ([place]) [J44.1] 09/01/2015  . Sepsis ([place]) [A41.9] 08/27/2015    Total Time spent with patient: 30 minutes  Subjective:   Debra [name] is a 84 y.o. female patient admitted with "I guess I am a little better".  HPI: See previous notes.  Patient with a history of depression and anxiety.  Today feeling a little bit better.  She still is feeling very weak and was not able to be fully active with physical therapy.  She is very relieved however to know that plans are a foot to get her into skilled nursing.  Still has some talk about at times wishing that the [facility] would take her but once again absolutely denies there is any suicidality.  Past Psychiatric History: History of depression on long-standing medication.  Risk to Self: Is patient at risk for suicide?: No Risk to Others:   Prior Inpatient Therapy:   Prior Outpatient Therapy:    Past Medical History:  Past Medical History:  Diagnosis Date  . Asthma   . CHF  (congestive heart failure) ([place])   . COPD (chronic obstructive pulmonary disease) ([place])   . Myocardial infarction [facility])     Past Surgical History:  Procedure Laterality Date  . ABDOMINAL HYSTERECTOMY    . BREAST SURGERY    . BYPASS GRAFT  2007   triple  . CHOLECYSTECTOMY    . CORONARY ARTERY BYPASS GRAFT    . TONSILLECTOMY     Family History:  Family History  Problem Relation Age of Onset  . CAD Father    Family Psychiatric  History: Positive for anxiety Social History:  Social History   Substance and Sexual Activity  Alcohol Use No     Social History   Substance and Sexual Activity  Drug Use No    Social History   Socioeconomic History  . Marital status: Widowed    Spouse name: Not on file  . Number of children: Not on file  . Years of education: Not on file  . Highest education level: Not on file  Occupational History  . Not on file  Social Needs  . Financial resource strain: Not on file  . Food insecurity:    Worry: Not on file    Inability: Not on file  . Transportation needs:    Medical: Not on file    Non-medical: Not on file  Tobacco Use  . Smoking status: Former Smoker    Packs/day: 0.50    Years: 55.00    Pack years: 27.50    Last attempt to quit: 11/21/2017    Years since quitting: 0.0  . Smokeless tobacco: Never Used  Substance  and Sexual Activity  . Alcohol use: No  . Drug use: No  . Sexual activity: Not on file  Lifestyle  . Physical activity:    Days per week: Not on file    Minutes per session: Not on file  . Stress: Not on file  Relationships  . Social connections:    Talks on phone: Not on file    Gets together: Not on file    Attends religious service: Not on file    Active member of club or organization: Not on file    Attends meetings of clubs or organizations: Not on file    Relationship status: Not on file  Other Topics Concern  . Not on file  Social History Narrative  . Not on file   Additional Social History:     Allergies:   Allergies  Allergen Reactions  . Codeine Itching and Rash    Labs:  Results for orders placed or performed during the hospital encounter of 12/22/17 (from the past 48 hour(s))  CBC     Status: Abnormal   Collection Time: 12/25/17  4:36 AM  Result Value Ref Range   WBC 13.2 (H) 3.6 - 11.0 K/uL   RBC 3.23 (L) 3.80 - 5.20 MIL/uL   Hemoglobin 11.3 (L) 12.0 - 16.0 g/dL   HCT 33.0 (L) 35.0 - 47.0 %   MCV 102.1 (H) 80.0 - 100.0 fL   MCH 34.9 (H) 26.0 - 34.0 pg   MCHC 34.2 32.0 - 36.0 g/dL   RDW 15.0 (H) 11.5 - 14.5 %   Platelets 324 150 - 440 K/uL    Comment: Performed at [facility], [address] [place], [place] [address]  Basic metabolic panel     Status: Abnormal   Collection Time: 12/25/17  4:36 AM  Result Value Ref Range   Sodium 137 135 - 145 mmol/L    Comment: REPEATED ELECTROLYTES SDR   Potassium 5.5 (H) 3.5 - 5.1 mmol/L   Chloride 99 (L) 101 - 111 mmol/L   CO2 36 (H) 22 - 32 mmol/L   Glucose, Bld 141 (H) 65 - 99 mg/dL   BUN 32 (H) 6 - 20 mg/dL   Creatinine, Ser 1.07 (H) 0.44 - 1.00 mg/dL   Calcium 8.5 (L) 8.9 - 10.3 mg/dL   GFR calc non Af Amer 47 (L) >60 mL/min   GFR calc Af Amer 54 (L) >60 mL/min    Comment: (NOTE) The eGFR has been calculated using the CKD EPI equation. This calculation has not been validated in all clinical situations. eGFR's persistently <60 mL/min signify possible Chronic Kidney Disease.    Anion gap 2 (L) 5 - 15    Comment: Performed at [facility], [address] [place], [place] [address]  Potassium     Status: None   Collection Time: 12/25/17  6:56 PM  Result Value Ref Range   Potassium 4.5 3.5 - 5.1 mmol/L    Comment: Performed at [facility], [address] [place], [place] [address]  CBC     Status: Abnormal   Collection Time: 12/26/17  6:10 AM  Result Value Ref Range   WBC 10.7 3.6 - 11.0 K/uL   RBC 3.44 (L) 3.80 - 5.20 MIL/uL   Hemoglobin 12.1 12.0 - 16.0 g/dL   HCT 35.3  35.0 - 47.0 %   MCV 102.8 (H) 80.0 - 100.0 fL   MCH 35.3 (H) 26.0 - 34.0 pg   MCHC 34.4 32.0 -  36.0 g/dL   RDW 14.8 (H) 11.5 - 14.5 %   Platelets 325 150 - 440 K/uL    Comment: Performed at [facility], [address] [place], [place] [address]  Basic metabolic panel     Status: Abnormal   Collection Time: 12/26/17  6:10 AM  Result Value Ref Range   Sodium 135 135 - 145 mmol/L   Potassium 5.0 3.5 - 5.1 mmol/L   Chloride 93 (L) 101 - 111 mmol/L   CO2 35 (H) 22 - 32 mmol/L   Glucose, Bld 136 (H) 65 - 99 mg/dL   BUN 40 (H) 6 - 20 mg/dL   Creatinine, Ser 1.29 (H) 0.44 - 1.00 mg/dL   Calcium 8.6 (L) 8.9 - 10.3 mg/dL   GFR calc non Af Amer 37 (L) >60 mL/min   GFR calc Af Amer 43 (L) >60 mL/min    Comment: (NOTE) The eGFR has been calculated using the CKD EPI equation. This calculation has not been validated in all clinical situations. eGFR's persistently <60 mL/min signify possible Chronic Kidney Disease.    Anion gap 7 5 - 15    Comment: Performed at [facility], [address] [place], [place] [address]    Current Facility-Administered Medications  Medication Dose Route Frequency Provider Last Rate Last Dose  . acetaminophen (TYLENOL) tablet 650 mg  650 mg Oral Q6H PRN Demetrios Loll, MD   650 mg at 12/23/17 1019   Or  . acetaminophen (TYLENOL) suppository 650 mg  650 mg Rectal Q6H PRN Demetrios Loll, MD      . albuterol (PROVENTIL) (2.5 MG/3ML) 0.083% nebulizer solution 2.5 mg  2.5 mg Nebulization Q2H PRN Demetrios Loll, MD   2.5 mg at 12/25/17 1646  . ALPRAZolam Duanne Moron) tablet 0.5 mg  0.5 mg Oral BID Lonetta Blassingame, Madie Reno, MD   0.5 mg at 12/26/17 0853  . aspirin chewable tablet 81 mg  81 mg Oral Inez Catalina, Sheppard Evens, MD   81 mg at 12/26/17 0853  . atorvastatin (LIPITOR) tablet 10 mg  10 mg Oral QPM Demetrios Loll, MD   10 mg at 12/26/17 1720  . benzonatate (TESSALON) capsule 100 mg  100 mg Oral TID PRN Demetrios Loll, MD      . bisacodyl (DULCOLAX) EC tablet 5 mg  5 mg Oral Daily PRN  Demetrios Loll, MD      . celecoxib (CELEBREX) capsule 200 mg  200 mg Oral BID Max Sane, MD   200 mg at 12/26/17 0853  . clopidogrel (PLAVIX) tablet 75 mg  75 mg Oral Inez Catalina, Sheppard Evens, MD   75 mg at 12/26/17 0853  . docusate sodium (COLACE) capsule 100 mg  100 mg Oral Daily PRN Max Sane, MD      . enoxaparin (LOVENOX) injection 40 mg  40 mg Subcutaneous Q24H Demetrios Loll, MD   40 mg at 12/25/17 2053  . FLUoxetine (PROZAC) capsule 30 mg  30 mg Oral Daily Gouru, Aruna, MD   30 mg at 12/26/17 1149  . furosemide (LASIX) tablet 40 mg  40 mg Oral Daily Max Sane, MD   40 mg at 12/26/17 0858  . guaiFENesin (ROBITUSSIN) 100 MG/5ML solution 100 mg  5 mL Oral Q4H PRN Demetrios Loll, MD   100 mg at 12/25/17 2100  . HYDROcodone-acetaminophen (NORCO/VICODIN) 5-325 MG per tablet 1 tablet  1 tablet Oral Q6H PRN Gouru, Aruna, MD      . ipratropium-albuterol (DUONEB) 0.5-2.5 (3) MG/3ML nebulizer solution 3 mL  3  mL Nebulization TID Max Sane, MD   3 mL at 12/26/17 1033  . latanoprost (XALATAN) 0.005 % ophthalmic solution 1 drop  1 drop Both Eyes QHS Demetrios Loll, MD   1 drop at 12/25/17 2053  . methylPREDNISolone sodium succinate (SOLU-MEDROL) 40 mg/mL injection 40 mg  40 mg Intravenous Q12H Max Sane, MD   40 mg at 12/26/17 1323  . ondansetron (ZOFRAN) tablet 4 mg  4 mg Oral Q6H PRN Demetrios Loll, MD       Or  . ondansetron [facility]) injection 4 mg  4 mg Intravenous Q6H PRN Demetrios Loll, MD   4 mg at 12/24/17 [phone]  . pantoprazole (PROTONIX) EC tablet 40 mg  40 mg Oral Inez Catalina, Sheppard Evens, MD   40 mg at 12/26/17 0854  . senna-docusate (Senokot-S) tablet 1 tablet  1 tablet Oral QHS PRN Demetrios Loll, MD      . sodium chloride flush (NS) 0.9 % injection 3 mL  3 mL Intravenous Q12H Manuella Ghazi, Vipul, MD   3 mL at 12/26/17 0900  . sodium chloride flush (NS) 0.9 % injection 3 mL  3 mL Intravenous Q12H Max Sane, MD   3 mL at 12/26/17 0859  . sodium chloride flush (NS) 0.9 % injection 3 mL  3 mL Intravenous PRN Max Sane, MD       . tiotropium (SPIRIVA) inhalation capsule 18 mcg  18 mcg Inhalation q morning - 10a Demetrios Loll, MD   18 mcg at 12/26/17 [phone]  . tiZANidine (ZANAFLEX) tablet 2 mg  2 mg Oral TID PRN Demetrios Loll, MD   2 mg at 12/24/17 0759  . traZODone (DESYREL) tablet 50 mg  50 mg Oral QHS Demetrios Loll, MD   50 mg at 12/25/17 2053    Musculoskeletal: Strength & Muscle Tone: within normal limits Gait & Station: unable to stand Patient leans: N/A  Psychiatric Specialty Exam: Physical Exam  Nursing note and vitals reviewed. Constitutional: She appears well-developed and well-nourished.  HENT:  Head: Normocephalic and atraumatic.  Eyes: Pupils are equal, round, and reactive to light. Conjunctivae are normal.  Neck: Normal range of motion.  Cardiovascular: Regular rhythm and normal heart sounds.  Respiratory: Effort normal. No respiratory distress.  GI: Soft.  Musculoskeletal: Normal range of motion.  Neurological: She is alert.  Skin: Skin is warm and dry.  Psychiatric: She has a normal mood and affect. Her speech is normal and behavior is normal. Judgment and thought content normal. Cognition and memory are normal.    Review of Systems  Constitutional: Positive for malaise/fatigue.  HENT: Negative.   Eyes: Negative.   Respiratory: Negative.   Cardiovascular: Negative.   Gastrointestinal: Negative.   Musculoskeletal: Positive for falls.  Skin: Negative.   Neurological: Positive for weakness.  Psychiatric/Behavioral: Negative for depression, hallucinations, memory loss, substance abuse and suicidal ideas. The patient is not nervous/anxious and does not have insomnia.     Blood pressure 138/85, pulse 63, temperature 98.1 F (36.7 C), resp. rate 18, height '5\' 4"'  (1.626 m), weight 168 lb 1.6 oz (76.2 kg), SpO2 (!) 87 %.Body mass index is 28.85 kg/m.  General Appearance: Casual  Eye Contact:  Fair  Speech:  Slow  Volume:  Decreased  Mood:  Euthymic  Affect:  Constricted  Thought Process:  Goal  Directed  Orientation:  Full (Time, Place, and Person)  Thought Content:  Logical  Suicidal Thoughts:  No  Homicidal Thoughts:  No  Memory:  Immediate;   Fair Recent;   Fair  Remote;   Fair  Judgement:  Fair  Insight:  Fair  Psychomotor Activity:  Decreased  Concentration:  Concentration: Fair  Recall:  [employer] of Knowledge:  Fair  Language:  Fair  Akathisia:  No  Handed:  Right  AIMS (if indicated):     Assets:  Desire for Improvement  ADL's:  Impaired  Cognition:  WNL  Sleep:        Treatment Plan Summary: Plan No change to medication.  Supportive therapy.  Encourage patient to continue working on getting her strength back.  Follow up just as needed.  Disposition: No evidence of imminent risk to self or others at present.   Patient does not meet criteria for psychiatric inpatient admission.  Alethia Berthold, MD 12/26/2017 5:24 PM

## 2017-12-26 NOTE — Progress Notes (Addendum)
[place] [place] NAME: Stella Bortle    MR#:  [id number]  DATE OF BIRTH:  1933/04/05  SUBJECTIVE:  CHIEF COMPLAINT:  No chief complaint on file. seems anxious, shortness of breath and cough are slightly better but getting worse with minimal exertion.  Son at bedside. REVIEW OF SYSTEMS:  Review of Systems  Constitutional: Negative for chills, fever and weight loss.  HENT: Negative for nosebleeds and sore throat.   Eyes: Negative for blurred vision.  Respiratory: Positive for shortness of breath. Negative for cough and wheezing.   Cardiovascular: Negative for chest pain, orthopnea, leg swelling and PND.  Gastrointestinal: Negative for abdominal pain, constipation, diarrhea, heartburn, nausea and vomiting.  Genitourinary: Negative for dysuria and urgency.  Musculoskeletal: Negative for back pain.  Skin: Negative for rash.  Neurological: Negative for dizziness, speech change, focal weakness and headaches.  Endo/Heme/Allergies: Does not bruise/bleed easily.  Psychiatric/Behavioral: Negative for depression.   DRUG ALLERGIES:   Allergies  Allergen Reactions  . Codeine Itching and Rash   VITALS:  Blood pressure (!) 128/57, pulse 82, temperature 98.1 F (36.7 C), temperature source Oral, resp. rate 18, height 5\' 4"  (1.626 m), weight 76.2 kg (168 lb 1.6 oz), SpO2 92 %. PHYSICAL EXAMINATION:  Physical Exam  Constitutional: She is oriented to person, place, and time.  HENT:  Head: Normocephalic and atraumatic.  Eyes: Pupils are equal, round, and reactive to light. Conjunctivae and EOM are normal.  Neck: Normal range of motion. Neck supple. No tracheal deviation present. No thyromegaly present.  Cardiovascular: Normal rate, regular rhythm and normal heart sounds.  Pulmonary/Chest: Effort normal and breath sounds normal. No respiratory distress. She has no wheezes. She exhibits no tenderness.  Abdominal: Soft. Bowel sounds are normal. She  exhibits no distension. There is no tenderness.  Musculoskeletal: Normal range of motion.  Neurological: She is alert and oriented to person, place, and time. No cranial nerve deficit.  Skin: Skin is warm and dry. No rash noted.  Psychiatric: Her mood appears anxious. She exhibits a depressed mood.   LABORATORY PANEL:  Female CBC Recent Labs  Lab 12/26/17 0610  WBC 10.7  HGB 12.1  HCT 35.3  PLT 325   ------------------------------------------------------------------------------------------------------------------ Chemistries  Recent Labs  Lab 12/22/17 1305  12/26/17 0610  NA 133*   < > 135  K 3.9   < > 5.0  CL 95*   < > 93*  CO2 30   < > 35*  GLUCOSE 122*   < > 136*  BUN 29*   < > 40*  CREATININE 1.14*   < > 1.29*  CALCIUM 8.2*   < > 8.6*  MG 1.8  --   --   AST 23  --   --   ALT 12*  --   --   ALKPHOS 62  --   --   BILITOT 0.9  --   --    < > = values in this interval not displayed.   RADIOLOGY:  No results found. ASSESSMENT AND PLAN:   * Acute on chronic respiratory failure with hypoxia due to COPD exacerbation. - wean O2 as tolerated, still on 2-3 liters O2 - BiPAP prn - continue Albuterol every 6 hours, IV Solu-Medrol every 24 hours (tapering from Q 8 hrs), Robitussin as needed.   - Appreciate PCCM input -Incentive spirometry  * Hyperkalemia: -Potassium at 5.0, will check a.m. labs  * Hypertension.  Hold hypertension medication due to low  side/normal blood pressure.  * Hyponatremia and dehydration: resolved. Resumed lasix 40 mg daily  * Leukocytosis.   Possible due to home prednisone.  Follow-up UA and CBC.  * History of chronic diastolic CHF.  Stable.  Hold Lasix due to dehydration and low side blood pressure.  * Anxiety/Major Depression:  -Not at imminent danger to herself - Increase prozac to 30 mg daily, trazodone  - Xanax changed to scheduled dose per psych - Appreciate Psych assistance  * h/o recurrent syncope in 2017 - Avoid controlling BP  too tight as prone for orthostasis   Physical therapy is recommending SNF  My preference is to get this patient safely home with HHPT and other services if possible.  I Had a discussion with son today, he might consider taking patient his home and to provide home health PT at his home   All the records are reviewed and case discussed with Care Management/Social Worker. Management plans discussed with the patient, son at bedside and they are in agreement.  CODE STATUS: DNR  TOTAL TIME TAKING CARE OF THIS PATIENT: 35 minutes.   More than 50% of the time was spent in counseling/coordination of care: YES  POSSIBLE D/C IN 1-2 DAYS, DEPENDING ON CLINICAL CONDITION.   Nicholes Mango M.D on 12/26/2017 at 9:39 PM  Between 7am to 6pm - Pager - [phone] After 6pm go to [email or web address] - Proofreader  [facility] [place]  Office  [phone]  CC: Primary care physician; Jodi Marble, MD  Note: This dictation was prepared with Dragon dictation along with smaller phrase technology. Any transcriptional errors that result from this process are unintentional.

## 2017-12-26 NOTE — Plan of Care (Signed)
  Problem: Clinical Measurements: Goal: Ability to maintain clinical measurements within normal limits will improve Outcome: Progressing Goal: Will remain free from infection Outcome: Progressing   Problem: Coping: Goal: Level of anxiety will decrease Outcome: Progressing   Problem: Pain Managment: Goal: General experience of comfort will improve Outcome: Progressing   Problem: Safety: Goal: Ability to remain free from injury will improve Outcome: Progressing

## 2017-12-26 NOTE — Care Management (Signed)
RNCM consult for "Patient does not feel safe going home, patient leaves with grand daughter but she does not help patient with her care."  PT recommending SNF.  RNCM following should there be needs at discharge.

## 2017-12-26 NOTE — NC FL2 (Signed)
[place] LEVEL OF CARE SCREENING TOOL     IDENTIFICATION  Patient Name: ROLLA SERVIDIO Birthdate: [birth year] Sex: female Admission Date (Current Location): 12/22/2017  [place] and [place] Number:  Engineering geologist and Address:  [facility], [address], [place],  [address]      Provider Number: [id number]  Attending Physician Name and Address:  Nicholes Mango, MD  Relative Name and Phone Number:  [place], Devine Daughter [phone]  [phone] or Lylla, Eifler [phone]     Current Level of Care: Hospital Recommended Level of Care: [place] Prior Approval Number:    Date Approved/Denied:   PASRR Number: [id number]  Discharge Plan: SNF    Current Diagnoses: Patient Active Problem List   Diagnosis Date Noted  . Moderate recurrent major depression ([place]) 12/25/2017  . Acute adjustment disorder with anxiety 12/25/2017  . Somatic symptom disorder 12/25/2017  . Acute respiratory failure with hypoxia ([place]) 12/22/2017  . CHF (congestive heart failure) ([place]) 12/05/2016  . CAP (community acquired pneumonia) 10/05/2016  . Chronic systolic heart failure ([place]) 06/28/2016  . COPD (chronic obstructive pulmonary disease) with emphysema ([place]) 06/28/2016  . Tobacco use 06/28/2016  . Acidosis 09/14/2015  . Hypotension 09/14/2015  . Hypokalemia 09/14/2015  . COPD exacerbation ([place]) 09/01/2015  . Sepsis ([place]) 08/27/2015    Orientation RESPIRATION BLADDER Height & Weight     Self, Time, Situation, Place  O2(3L) Incontinent Weight: 168 lb 1.6 oz (76.2 kg) Height:  5\' 4"  (162.6 cm)  BEHAVIORAL SYMPTOMS/MOOD NEUROLOGICAL BOWEL NUTRITION STATUS      Continent Diet  AMBULATORY STATUS COMMUNICATION OF NEEDS Skin   Limited Assist Verbally Surgical wounds                       Personal Care Assistance Level of Assistance  Bathing, Feeding, Dressing Bathing Assistance: Limited assistance Feeding  assistance: Limited assistance Dressing Assistance: Limited assistance     Functional Limitations Info  Sight, Hearing, Speech Sight Info: Adequate Hearing Info: Adequate Speech Info: Adequate    SPECIAL CARE FACTORS FREQUENCY  PT (By licensed PT), OT (By licensed OT)     PT Frequency: 5x a week OT Frequency: 5x a week            Contractures Contractures Info: Not present    Additional Factors Info  Code Status, Allergies, Psychotropic Code Status Info: DNR Allergies Info: Codeine Psychotropic Info: ALPRAZolam (XANAX) tablet 0.5 mg and FLUoxetine (PROZAC) capsule 30 mg and traZODone (DESYREL) tablet 50 mg          Current Medications (12/26/2017):  This is the current hospital active medication list Current Facility-Administered Medications  Medication Dose Route Frequency Provider Last Rate Last Dose  . acetaminophen (TYLENOL) tablet 650 mg  650 mg Oral Q6H PRN Demetrios Loll, MD   650 mg at 12/23/17 1019   Or  . acetaminophen (TYLENOL) suppository 650 mg  650 mg Rectal Q6H PRN Demetrios Loll, MD      . albuterol (PROVENTIL) (2.5 MG/3ML) 0.083% nebulizer solution 2.5 mg  2.5 mg Nebulization Q2H PRN Demetrios Loll, MD   2.5 mg at 12/25/17 1646  . ALPRAZolam Duanne Moron) tablet 0.5 mg  0.5 mg Oral BID Clapacs, Madie Reno, MD   0.5 mg at 12/26/17 0853  . aspirin chewable tablet 81 mg  81 mg Oral Inez Catalina, Sheppard Evens, MD   81 mg at 12/26/17 0853  . atorvastatin (LIPITOR) tablet 10 mg  10 mg Oral QPM  Demetrios Loll, MD   10 mg at 12/25/17 1747  . benzonatate (TESSALON) capsule 100 mg  100 mg Oral TID PRN Demetrios Loll, MD      . bisacodyl (DULCOLAX) EC tablet 5 mg  5 mg Oral Daily PRN Demetrios Loll, MD      . celecoxib (CELEBREX) capsule 200 mg  200 mg Oral BID Max Sane, MD   200 mg at 12/26/17 0853  . clopidogrel (PLAVIX) tablet 75 mg  75 mg Oral Inez Catalina, Sheppard Evens, MD   75 mg at 12/26/17 0853  . docusate sodium (COLACE) capsule 100 mg  100 mg Oral Daily PRN Max Sane, MD      . enoxaparin (LOVENOX)  injection 40 mg  40 mg Subcutaneous Q24H Demetrios Loll, MD   40 mg at 12/25/17 2053  . FLUoxetine (PROZAC) capsule 30 mg  30 mg Oral Daily Gouru, Aruna, MD   30 mg at 12/26/17 1149  . furosemide (LASIX) tablet 40 mg  40 mg Oral Daily Max Sane, MD   40 mg at 12/26/17 0858  . guaiFENesin (ROBITUSSIN) 100 MG/5ML solution 100 mg  5 mL Oral Q4H PRN Demetrios Loll, MD   100 mg at 12/25/17 2100  . HYDROcodone-acetaminophen (NORCO/VICODIN) 5-325 MG per tablet 1 tablet  1 tablet Oral Q6H PRN Gouru, Aruna, MD      . ipratropium-albuterol (DUONEB) 0.5-2.5 (3) MG/3ML nebulizer solution 3 mL  3 mL Nebulization TID Max Sane, MD   3 mL at 12/26/17 1033  . latanoprost (XALATAN) 0.005 % ophthalmic solution 1 drop  1 drop Both Eyes QHS Demetrios Loll, MD   1 drop at 12/25/17 2053  . methylPREDNISolone sodium succinate (SOLU-MEDROL) 40 mg/mL injection 40 mg  40 mg Intravenous Q12H Max Sane, MD   40 mg at 12/26/17 1323  . ondansetron (ZOFRAN) tablet 4 mg  4 mg Oral Q6H PRN Demetrios Loll, MD       Or  . ondansetron [facility]) injection 4 mg  4 mg Intravenous Q6H PRN Demetrios Loll, MD   4 mg at 12/24/17 [phone]  . pantoprazole (PROTONIX) EC tablet 40 mg  40 mg Oral Inez Catalina, Sheppard Evens, MD   40 mg at 12/26/17 0854  . senna-docusate (Senokot-S) tablet 1 tablet  1 tablet Oral QHS PRN Demetrios Loll, MD      . sodium chloride flush (NS) 0.9 % injection 3 mL  3 mL Intravenous Q12H Manuella Ghazi, Vipul, MD   3 mL at 12/26/17 0900  . sodium chloride flush (NS) 0.9 % injection 3 mL  3 mL Intravenous Q12H Max Sane, MD   3 mL at 12/26/17 0859  . sodium chloride flush (NS) 0.9 % injection 3 mL  3 mL Intravenous PRN Max Sane, MD      . tiotropium (SPIRIVA) inhalation capsule 18 mcg  18 mcg Inhalation q morning - 10a Demetrios Loll, MD   18 mcg at 12/26/17 [phone]  . tiZANidine (ZANAFLEX) tablet 2 mg  2 mg Oral TID PRN Demetrios Loll, MD   2 mg at 12/24/17 0759  . traZODone (DESYREL) tablet 50 mg  50 mg Oral QHS Demetrios Loll, MD   50 mg at 12/25/17 2053      Discharge Medications: Please see discharge summary for a list of discharge medications.  Relevant Imaging Results:  Relevant Lab Results:   Additional Information SSN [id number]  Ross Ludwig, [place]

## 2017-12-27 LAB — BASIC METABOLIC PANEL
ANION GAP: 5 (ref 5–15)
BUN: 45 mg/dL — AB (ref 6–20)
CO2: 37 mmol/L — ABNORMAL HIGH (ref 22–32)
Calcium: 8.5 mg/dL — ABNORMAL LOW (ref 8.9–10.3)
Chloride: 95 mmol/L — ABNORMAL LOW (ref 101–111)
Creatinine, Ser: 1.31 mg/dL — ABNORMAL HIGH (ref 0.44–1.00)
GFR, EST AFRICAN AMERICAN: 42 mL/min — AB (ref >60)
GFR, EST NON AFRICAN AMERICAN: 37 mL/min — AB (ref >60)
Glucose, Bld: 109 mg/dL — ABNORMAL HIGH (ref 65–99)
POTASSIUM: 4.8 mmol/L (ref 3.5–5.1)
SODIUM: 137 mmol/L (ref 135–145)

## 2017-12-27 MED ORDER — SODIUM CHLORIDE 0.9 % IV SOLN: INTRAVENOUS | Status: DC

## 2017-12-27 MED ORDER — SODIUM CHLORIDE 0.9 % IV SOLN
INTRAVENOUS | Status: AC
  Administered 2017-12-27: 11:00:00 via INTRAVENOUS

## 2017-12-27 MED ORDER — PREDNISONE 50 MG PO TABS
50.0000 mg | ORAL_TABLET | Freq: Every day | ORAL | Status: DC
  Administered 2017-12-28: 50 mg via ORAL
  Filled 2017-12-27: qty 1

## 2017-12-27 NOTE — Progress Notes (Signed)
Pt IV keeps beeping. IV team was consulted. Will continue to monitor.

## 2017-12-27 NOTE — Plan of Care (Signed)
  Problem: Clinical Measurements: Goal: Ability to maintain clinical measurements within normal limits will improve Outcome: Progressing Goal: Will remain free from infection Outcome: Progressing   Problem: Nutrition: Goal: Adequate nutrition will be maintained Outcome: Progressing   Problem: Pain Managment: Goal: General experience of comfort will improve Outcome: Progressing   Problem: Safety: Goal: Ability to remain free from injury will improve Outcome: Progressing

## 2017-12-27 NOTE — Progress Notes (Signed)
[facility] [place] NAME: Debra [name]    MR#:  [id number]  DATE OF BIRTH:  1932-11-18  SUBJECTIVE:  CHIEF COMPLAINT:  No chief complaint on file. pts sob is better while resting.  Reporting exertional dyspnea.  Really concerned and feeling depressed to go home as 1 of the family members is doing drugs at home.  Patient is getting very anxious and feels unsafe to go home, patient's son is aware of it REVIEW OF SYSTEMS:  Review of Systems  Constitutional: Negative for chills, fever and weight loss.  HENT: Negative for nosebleeds and sore throat.   Eyes: Negative for blurred vision.  Respiratory: Positive for shortness of breath. Negative for cough and wheezing.   Cardiovascular: Negative for chest pain, orthopnea, leg swelling and PND.  Gastrointestinal: Negative for abdominal pain, constipation, diarrhea, heartburn, nausea and vomiting.  Genitourinary: Negative for dysuria and urgency.  Musculoskeletal: Negative for back pain.  Skin: Negative for rash.  Neurological: Negative for dizziness, speech change, focal weakness and headaches.  Endo/Heme/Allergies: Does not bruise/bleed easily.  Psychiatric/Behavioral: Negative for depression.   DRUG ALLERGIES:   Allergies  Allergen Reactions  . Codeine Itching and Rash   VITALS:  Blood pressure 107/69, pulse 62, temperature 97.8 F (36.6 C), resp. rate 18, height 5\' 4"  (1.626 m), weight 74.9 kg (165 lb 1.6 oz), SpO2 94 %. PHYSICAL EXAMINATION:  Physical Exam  Constitutional: She is oriented to person, place, and time.  HENT:  Head: Normocephalic and atraumatic.  Eyes: Pupils are equal, round, and reactive to light. Conjunctivae and EOM are normal.  Neck: Normal range of motion. Neck supple. No tracheal deviation present. No thyromegaly present.  Cardiovascular: Normal rate, regular rhythm and normal heart sounds.  Pulmonary/Chest: Effort normal and breath sounds normal. No respiratory  distress. She has no wheezes. She exhibits no tenderness.  Abdominal: Soft. Bowel sounds are normal. She exhibits no distension. There is no tenderness.  Musculoskeletal: Normal range of motion.  Neurological: She is alert and oriented to person, place, and time. No cranial nerve deficit.  Skin: Skin is warm and dry. No rash noted.  Psychiatric: Her mood appears anxious. She exhibits a depressed mood.   LABORATORY PANEL:  Female CBC Recent Labs  Lab 12/26/17 0610  WBC 10.7  HGB 12.1  HCT 35.3  PLT 325   ------------------------------------------------------------------------------------------------------------------ Chemistries  Recent Labs  Lab 12/22/17 1305  12/27/17 0558  NA 133*   < > 137  K 3.9   < > 4.8  CL 95*   < > 95*  CO2 30   < > 37*  GLUCOSE 122*   < > 109*  BUN 29*   < > 45*  CREATININE 1.14*   < > 1.31*  CALCIUM 8.2*   < > 8.5*  MG 1.8  --   --   AST 23  --   --   ALT 12*  --   --   ALKPHOS 62  --   --   BILITOT 0.9  --   --    < > = values in this interval not displayed.   RADIOLOGY:  No results found. ASSESSMENT AND PLAN:   * Acute on chronic respiratory failure with hypoxia due to COPD exacerbation. - wean O2 as tolerated, still on 2- liters O2 wean off oxygen as tolerated - BiPAP prn - continue Albuterol every 6 hours, IV Solu-Medrol every 24 hours (tapering from Q 8 hrs), will be  changed to p.o. Prednisone  Robitussin as needed.   - Appreciate PCCM input -Incentive spirometry -IS -Out of bed to chair and activity as tolerated  * Hyperkalemia: -Potassium at 5.0, will check a.m. labs  * Hypertension.  Hold hypertension medication due to low side/normal blood pressure.  * Hyponatremia and dehydration: resolved. Resumed lasix 40 mg daily  * Leukocytosis.   Possible due to home prednisone.  Follow-up UA and CBC.  * History of chronic diastolic CHF.  Stable.  Hold Lasix due to dehydration and low side blood pressure.  * Anxiety/Major  Depression:  -Not at imminent danger to herself - Increase prozac to 30 mg daily, trazodone  - Xanax changed to scheduled dose per psych - Appreciate Psych assistance  * h/o recurrent syncope in 2017 - Avoid controlling BP too tight as prone for orthostasis   Physical therapy is recommending SNF  My preference is to get this patient safely home with HHPT and other services if possible.  Patient is feeling depressed and anxious to go home as her family member  living with her is taking drugs does not feel safe to go home and agreeable to go to skilled nursing facility and preferably [facility] the records are reviewed and case discussed with Care Management/Social Worker. Management plans discussed with the patient, son at bedside and they are in agreement.  CODE STATUS: DNR  TOTAL TIME TAKING CARE OF THIS PATIENT: 35 minutes.   More than 50% of the time was spent in counseling/coordination of care: YES  POSSIBLE D/C IN 1-2 DAYS, DEPENDING ON CLINICAL CONDITION.   Nicholes Mango M.D on 12/27/2017 at 3:45 PM  Between 7am to 6pm - Pager - [phone] After 6pm go to [email or web address] - Proofreader  [facility]  Office  [phone]  CC: Primary care physician; Jodi Marble, MD  Note: This dictation was prepared with Dragon dictation along with smaller phrase technology. Any transcriptional errors that result from this process are unintentional.

## 2017-12-27 NOTE — Clinical Social Work Note (Signed)
Clinical Social Work Assessment  Patient Details  Name: Debra [name] MRN: [id number] Date of Birth: 05/30/1933  Date of referral:  12/27/17               Reason for consult:  Facility Placement                Permission sought to share information with:  Family Supports, Customer service manager Permission granted to share information::  Yes, Verbal Permission Granted  Name::     Lesle, [place] Daughter [phone]  [phone] or Lilymae, Swiech [phone]    Agency::  SNF admissions  Relationship::     Contact Information:     Housing/Transportation Living arrangements for the past 2 months:  Single Family Home Source of Information:  Patient Patient Interpreter Needed:  None Criminal Activity/Legal Involvement Pertinent to Current Situation/Hospitalization:  No - Comment as needed Significant Relationships:  Adult Children Lives with:  Relatives Do you feel safe going back to the place where you live?  No Need for family participation in patient care:  No (Coment)  Care giving concerns: Patient feels she needs some short term rehab before she is able to return back home.   Social Worker assessment / plan:  Patient is an 84 year old female who is alert and oriented x4.  Patient states she has been to rehab a few years ago at [facility], and she was not pleased with the experience.  CSW explained how insurance will pay for stay at SNF and what to expect.  CSW explained the process for trying to find a bed placement, patient expressed understanding.  Patient stated she would prefer [facility], [place] explained if [facility] does not have anything available she will have to look at other facilities and patient stated she understood.  Patient did not have any other questions, and gave CSW permission to begin bed search in [facility].  Employment status:  Retired Nurse, adult PT Recommendations:  [place] / Referral to community resources:  [place]  Patient/Family's Response to care:  Patient is in agreement to going to SNF for short term rehab.  Patient/Family's Understanding of and Emotional Response to Diagnosis, Current Treatment, and Prognosis:  Patient stated she is nervous about going to SNF again, but she is willing to try it again.  Patient stated she did not have a good experience last time, CSW stated hopefully this time will be better for her.  Patient expressed that she is hopeful it will be better too.  Emotional Assessment Appearance:  Appears stated age Attitude/Demeanor/Rapport:    Affect (typically observed):  Appropriate, Pleasant Orientation:  Oriented to Self, Oriented to Place, Oriented to  Time, Oriented to Situation Alcohol / Substance use:  Not Applicable Psych involvement (Current and /or in the community):  No (Comment)  Discharge Needs  Concerns to be addressed:  Care Coordination, Lack of Support Readmission within the last 30 days:  No Current discharge risk:  Lack of support system Barriers to Discharge:  Continued Medical Work up   Anell Barr 12/27/2017, 5:43 PM

## 2017-12-27 NOTE — Clinical Social Work Note (Signed)
CSW presented bed offers to patient and she chose [facility].  [place] was updated, and they have received insurance authorization for patient to go to SNF once she is medically ready for discharge and orders have been received.  If patient is ready over the weekend they can accept her.  CSW to continue to follow patient's progress throughout discharge planning.  Jones Broom. [place], MSW, [place]  12/27/2017 5:40 PM

## 2017-12-28 LAB — BASIC METABOLIC PANEL
Anion gap: 5 (ref 5–15)
BUN: 46 mg/dL — AB (ref 6–20)
CHLORIDE: 96 mmol/L — AB (ref 101–111)
CO2: 35 mmol/L — ABNORMAL HIGH (ref 22–32)
Calcium: 8.3 mg/dL — ABNORMAL LOW (ref 8.9–10.3)
Creatinine, Ser: 1.3 mg/dL — ABNORMAL HIGH (ref 0.44–1.00)
GFR, EST AFRICAN AMERICAN: 43 mL/min — AB (ref >60)
GFR, EST NON AFRICAN AMERICAN: 37 mL/min — AB (ref >60)
Glucose, Bld: 116 mg/dL — ABNORMAL HIGH (ref 65–99)
POTASSIUM: 4.9 mmol/L (ref 3.5–5.1)
SODIUM: 136 mmol/L (ref 135–145)

## 2017-12-28 MED ORDER — HYDROCODONE-ACETAMINOPHEN 5-325 MG PO TABS: 1.0000 | ORAL_TABLET | Freq: Four times a day (QID) | ORAL | 0 refills | Status: DC | PRN

## 2017-12-28 MED ORDER — PREDNISONE 10 MG (21) PO TBPK: 10.0000 mg | ORAL_TABLET | Freq: Every day | ORAL | 0 refills | Status: DC

## 2017-12-28 MED ORDER — SENNOSIDES-DOCUSATE SODIUM 8.6-50 MG PO TABS: 1.0000 | ORAL_TABLET | Freq: Every evening | ORAL | Status: AC | PRN

## 2017-12-28 MED ORDER — DOCUSATE SODIUM 100 MG PO CAPS: 100.0000 mg | ORAL_CAPSULE | Freq: Every day | ORAL | 0 refills | Status: AC | PRN

## 2017-12-28 MED ORDER — ALPRAZOLAM 0.5 MG PO TABS: 0.5000 mg | ORAL_TABLET | Freq: Two times a day (BID) | ORAL | 0 refills | Status: AC

## 2017-12-28 MED ORDER — IPRATROPIUM-ALBUTEROL 0.5-2.5 (3) MG/3ML IN SOLN: 3.0000 mL | Freq: Four times a day (QID) | RESPIRATORY_TRACT | 0 refills | Status: AC | PRN

## 2017-12-28 MED ORDER — TIZANIDINE HCL 2 MG PO TABS: 2.0000 mg | ORAL_TABLET | Freq: Three times a day (TID) | ORAL | 0 refills | Status: DC | PRN

## 2017-12-28 MED ORDER — GUAIFENESIN 100 MG/5ML PO SOLN: 5.0000 mL | ORAL | 0 refills | Status: DC | PRN

## 2017-12-28 NOTE — Progress Notes (Signed)
Patient was discharged and transferred by ambulance to [facility].  Phillis Knack, RN

## 2017-12-28 NOTE — Clinical Social Work Note (Signed)
The CSW is aware that the patient is discharging today. The CSW will deliver the discharge packet once the discharge summary is available. CSW is following.  Santiago Bumpers, MSW, [place]anya Presser [phone]

## 2017-12-28 NOTE — Discharge Summary (Signed)
[place] [facility] [place] NAME: Debra [name]    MR#:  [id number]  DATE OF BIRTH:  03/22/1933  DATE OF ADMISSION:  12/22/2017 ADMITTING PHYSICIAN: Demetrios Loll, MD  DATE OF DISCHARGE:  12/28/17  PRIMARY CARE PHYSICIAN: Jodi Marble, MD    ADMISSION DIAGNOSIS:  Acute respiratory failure with hypoxia (HCC) [J96.01] COPD with acute exacerbation (HCC) [J44.1]  DISCHARGE DIAGNOSIS:  Acute respiratory failure with hypoxia Acute COPD exacerbation Depression with anxiety  SECONDARY DIAGNOSIS:   Past Medical History:  Diagnosis Date  . Asthma   . CHF (congestive heart failure) ([place])   . COPD (chronic obstructive pulmonary disease) ([place])   . Myocardial infarction [facility]     HOSPITAL COURSE:   HPI  Debra [name]  is a 84 y.o. female with a known history of COPD, CHF, hypertension and MI.  The patient presents in ED with above chief complaints.  She denies any fever or chills, no chest pain or palpitation.  She is found hypoxia and put on 4 L oxygen.  She is treated with nebulizer without improvement.  ED physician requested BiPAP and stepdown admission.  Chest x-ray did not show any infiltrate or edema  * Acute on chronic respiratory failure with hypoxia due to COPD exacerbation. - wean O2 as tolerated, still on 2- liters O2 wean off oxygen as tolerated - continue Albuterol every 6 hours, IV Solu-Medrol every 24 hours (tapering from Q 8 hrs),  changed to p.o. Prednisone  Robitussin as needed.  - Appreciate PCCM input -Incentive spirometry -IS -Out of bed to chair and activity as tolerated  * Hyperkalemia: -Resolved potassium at 4.9  * Hypertension. Hold hypertension medication due to low side/normal blood pressure.  * Hyponatremia and dehydration: resolved. Resumed lasix 40 mg daily  * Leukocytosis.  Possible due to home prednisone. Follow-up UA andCBC.  * History of chronic diastolic CHF. Stable.  Lasix   *  Anxiety/Major Depression:  -Not at imminent danger to herself - Increase prozac to 30 mg daily, trazodone  - Xanax changed to scheduled dose per psych - Appreciate Psych assistance  * h/o recurrent syncope in 2017 - Avoid controlling BP too tight as prone for orthostasis -Discontinued hydralazine and Cozaar in view of soft blood pressure and recurrent syncope.  Continue metoprolol  Physical therapy is recommending SNF  My preference is to get this patient safely home with HHPT and other services if possible.  Patient is feeling depressed and anxious to go home as her grand daughter  living with her is taking drugs, pt  does not feel safe to go home and agreeable to go to skilled nursing facility at [place]:   stable  CONSULTS OBTAINED:  Treatment Team:  Clapacs, Madie Reno, MD   PROCEDURES  NONE  DRUG ALLERGIES:   Allergies  Allergen Reactions  . Codeine Itching and Rash    DISCHARGE MEDICATIONS:   Allergies as of 12/28/2017      Reactions   Codeine Itching, Rash      Medication List    STOP taking these medications   benzonatate 100 MG capsule Commonly known as:  TESSALON   bisacodyl 10 MG suppository Commonly known as:  DULCOLAX   fexofenadine 180 MG tablet Commonly known as:  ALLEGRA   guaiFENesin-dextromethorphan 100-10 MG/5ML syrup Commonly known as:  ROBITUSSIN DM   hydrALAZINE 25 MG tablet Commonly known as:  APRESOLINE   losartan 25 MG tablet  Commonly known as:  COZAAR   mometasone-formoterol 100-5 MCG/ACT Aero Commonly known as:  DULERA   pantoprazole 40 MG tablet Commonly known as:  PROTONIX   predniSONE 10 MG tablet Commonly known as:  DELTASONE   predniSONE 20 MG tablet Commonly known as:  DELTASONE Replaced by:  predniSONE 10 MG (21) Tbpk tablet   saccharomyces boulardii 250 MG capsule Commonly known as:  FLORASTOR   traZODone 50 MG tablet Commonly known as:  DESYREL     TAKE these  medications   acetaminophen 325 MG tablet Commonly known as:  TYLENOL Take 2 tablets (650 mg total) by mouth every 6 (six) hours as needed for mild pain (or Fever >/= 101).   albuterol (2.5 MG/3ML) 0.083% nebulizer solution Commonly known as:  PROVENTIL Take 2.5 mg by nebulization 4 (four) times daily as needed for wheezing or shortness of breath. What changed:  Another medication with the same name was removed. Continue taking this medication, and follow the directions you see here.   ALPRAZolam 0.5 MG tablet Commonly known as:  XANAX Take 1 tablet (0.5 mg total) by mouth 2 (two) times daily. What changed:    when to take this  reasons to take this   aspirin 81 MG chewable tablet Chew by mouth every morning.   atorvastatin 10 MG tablet Commonly known as:  LIPITOR Take 10 mg by mouth every evening.   celecoxib 200 MG capsule Commonly known as:  CELEBREX Take 200 mg by mouth 2 (two) times daily.   clopidogrel 75 MG tablet Commonly known as:  PLAVIX Take 75 mg by mouth every morning.   docusate sodium 100 MG capsule Commonly known as:  COLACE Take 1 capsule (100 mg total) by mouth daily as needed for mild constipation. What changed:    medication strength  how much to take   feeding supplement (ENSURE ENLIVE) Liqd Take 237 mLs by mouth 2 (two) times daily between meals.   FLUoxetine 20 MG capsule Commonly known as:  PROZAC Take 1 capsule (20 mg total) by mouth daily.   furosemide 40 MG tablet Commonly known as:  LASIX Take 1 tablet (40 mg total) by mouth daily. Start on TUesday 4/3   guaiFENesin 100 MG/5ML Soln Commonly known as:  ROBITUSSIN Take 5 mLs (100 mg total) by mouth every 4 (four) hours as needed for cough or to loosen phlegm.   HYDROcodone-acetaminophen 5-325 MG tablet Commonly known as:  NORCO/VICODIN Take 1 tablet by mouth every 6 (six) hours as needed for moderate pain.   ipratropium-albuterol 0.5-2.5 (3) MG/3ML Soln Commonly known as:   DUONEB Take 3 mLs by nebulization every 6 (six) hours as needed. What changed:    when to take this  Another medication with the same name was removed. Continue taking this medication, and follow the directions you see here.   latanoprost 0.005 % ophthalmic solution Commonly known as:  XALATAN Place 1 drop into both eyes at bedtime.   metoprolol tartrate 25 MG tablet Commonly known as:  LOPRESSOR Take 1 tablet (25 mg total) by mouth 2 (two) times daily.   OYSTERCAL 500 + D PO Take 1 tablet by mouth 2 (two) times daily.   predniSONE 10 MG (21) Tbpk tablet Commonly known as:  STERAPRED UNI-PAK 21 TAB Take 1 tablet (10 mg total) by mouth daily. Take 6 tablets by mouth for 1 day followed by  5 tablets by mouth for 1 day followed by  4 tablets by mouth for 1 day  followed by  3 tablets by mouth for 1 day followed by  2 tablets by mouth for 1 day followed by  1 tablet by mouth for a day and stop Replaces:  predniSONE 20 MG tablet   senna-docusate 8.6-50 MG tablet Commonly known as:  Senokot-S Take 1 tablet by mouth at bedtime as needed for mild constipation.   tiotropium 18 MCG inhalation capsule Commonly known as:  SPIRIVA HANDIHALER Place 1 capsule (18 mcg total) into inhaler and inhale every morning.   tiZANidine 2 MG tablet Commonly known as:  ZANAFLEX Take 1 tablet (2 mg total) by mouth 3 (three) times daily as needed for muscle spasms.        DISCHARGE INSTRUCTIONS:   F/u with primary care physician at the facility in 3 to 5 days or sooner as needed  DIET:  Cardiac diet  DISCHARGE CONDITION:  Fair  ACTIVITY:  Activity as tolerated  OXYGEN:  Home Oxygen: Yes.     Oxygen Delivery: 2  liters/min via Patient connected to nasal cannula oxygen  DISCHARGE LOCATION:  nursing home   If you experience worsening of your admission symptoms, develop shortness of breath, life threatening emergency, suicidal or homicidal thoughts you must seek medical attention  immediately by calling 911 or calling your MD immediately  if symptoms less severe.  You Must read complete instructions/literature along with all the possible adverse reactions/side effects for all the Medicines you take and that have been prescribed to you. Take any new Medicines after you have completely understood and accpet all the possible adverse reactions/side effects.   Please note  You were cared for by a hospitalist during your hospital stay. If you have any questions about your discharge medications or the care you received while you were in the hospital after you are discharged, you can call the unit and asked to speak with the hospitalist on call if the hospitalist that took care of you is not available. Once you are discharged, your primary care physician will handle any further medical issues. Please note that NO REFILLS for any discharge medications will be authorized once you are discharged, as it is imperative that you return to your primary care physician (or establish a relationship with a primary care physician if you do not have one) for your aftercare needs so that they can reassess your need for medications and monitor your lab values.     Today  No chief complaint on file.  Patient is feeling better.  Shortness of breath better.  Intermittent episodes of cough  ROS:  CONSTITUTIONAL: Denies fevers, chills. Denies any fatigue, weakness.  EYES: Denies blurry vision, double vision, eye pain. EARS, NOSE, THROAT: Denies tinnitus, ear pain, hearing loss. RESPIRATORY:  reports some cough, denies wheeze, shortness of breath.  CARDIOVASCULAR: Denies chest pain, palpitations, edema.  GASTROINTESTINAL: Denies nausea, vomiting, diarrhea, abdominal pain. Denies bright red blood per rectum. GENITOURINARY: Denies dysuria, hematuria. ENDOCRINE: Denies nocturia or thyroid problems. HEMATOLOGIC AND LYMPHATIC: Denies easy bruising or bleeding. SKIN: Denies rash or  lesion. MUSCULOSKELETAL: Denies pain in neck, back, shoulder, knees, hips or arthritic symptoms.  NEUROLOGIC: Denies paralysis, paresthesias.  PSYCHIATRIC: Denies anxiety or depressive symptoms.   VITAL SIGNS:  Blood pressure (!) 142/59, pulse 69, temperature 97.9 F (36.6 C), temperature source Oral, resp. rate 18, height 5\' 4"  (1.626 m), weight 75.6 kg (166 lb 11.2 oz), SpO2 92 %.  I/O:    Intake/Output Summary (Last 24 hours) at 12/28/2017 1342 Last data filed at 12/28/2017  1031 Gross per 24 hour  Intake 843 ml  Output 980 ml  Net -137 ml    PHYSICAL EXAMINATION:  GENERAL:  84 y.o.-year-old patient lying in the bed with no acute distress.  EYES: Pupils equal, round, reactive to light and accommodation. No scleral icterus. Extraocular muscles intact.  HEENT: Head atraumatic, normocephalic. Oropharynx and nasopharynx clear.  NECK:  Supple, no jugular venous distention. No thyroid enlargement, no tenderness.  LUNGS: Normal breath sounds bilaterally, no wheezing, rales,rhonchi or crepitation. No use of accessory muscles of respiration.  CARDIOVASCULAR: S1, S2 normal. No murmurs, rubs, or gallops.  ABDOMEN: Soft, non-tender, non-distended. Bowel sounds present. No organomegaly or mass.  EXTREMITIES: No pedal edema, cyanosis, or clubbing.  NEUROLOGIC: Cranial nerves II through XII are intact. Muscle strength 5/5 in all extremities. Sensation intact. Gait not checked.  PSYCHIATRIC: The patient is alert and oriented x 3.  SKIN: No obvious rash, lesion, or ulcer.   DATA REVIEW:   CBC Recent Labs  Lab 12/26/17 0610  WBC 10.7  HGB 12.1  HCT 35.3  PLT 325    Chemistries  Recent Labs  Lab 12/22/17 1305  12/28/17 0414  NA 133*   < > 136  K 3.9   < > 4.9  CL 95*   < > 96*  CO2 30   < > 35*  GLUCOSE 122*   < > 116*  BUN 29*   < > 46*  CREATININE 1.14*   < > 1.30*  CALCIUM 8.2*   < > 8.3*  MG 1.8  --   --   AST 23  --   --   ALT 12*  --   --   ALKPHOS 62  --   --    BILITOT 0.9  --   --    < > = values in this interval not displayed.    Cardiac Enzymes Recent Labs  Lab 12/22/17 1305  TROPONINI <0.03    Microbiology Results  Results for orders placed or performed during the hospital encounter of 10/05/16  Culture, sputum-assessment     Status: None   Collection Time: 10/06/16  9:12 AM  Result Value Ref Range Status   Specimen Description SPUTUM  Final   Special Requests NONE  Final   Sputum evaluation THIS SPECIMEN IS ACCEPTABLE FOR SPUTUM CULTURE  Final   Report Status Nov 13, 202018 FINAL  Final  Culture, respiratory (NON-Expectorated)     Status: None   Collection Time: 10/06/16  9:12 AM  Result Value Ref Range Status   Specimen Description SPUTUM  Final   Special Requests NONE Reflexed from [id number]  Final   Gram Stain   Final    MODERATE WBC PRESENT, PREDOMINANTLY PMN ABUNDANT GRAM POSITIVE COCCI IN PAIRS FEW GRAM POSITIVE COCCI IN CHAINS Performed at [facility], [place] [address] [place], [place] [address]    Culture MULTIPLE ORGANISMS PRESENT, NONE PREDOMINANT  Final   Report Status 10/08/2016 FINAL  Final  MRSA PCR Screening     Status: None   Collection Time: 10/09/16  6:09 PM  Result Value Ref Range Status   MRSA by PCR NEGATIVE NEGATIVE Final    Comment:        The GeneXpert MRSA Assay (FDA approved for NASAL specimens only), is one component of a comprehensive MRSA colonization surveillance program. It is not intended to diagnose MRSA infection nor to guide or monitor treatment for MRSA infections.     RADIOLOGY:  No results found.  EKG:   Orders placed or performed during the hospital encounter of 12/22/17  . ED EKG  . ED EKG      Management plans discussed with the patient, family and they are in agreement.  CODE STATUS:     Code Status Orders  (From admission, onward)        Start     Ordered   12/22/17 1631  Do not attempt resuscitation (DNR)  Continuous    Question Answer Comment  In  the event of cardiac or respiratory ARREST Do not call a "code blue"   In the event of cardiac or respiratory ARREST Do not perform Intubation, CPR, defibrillation or ACLS   In the event of cardiac or respiratory ARREST Use medication by any route, position, wound care, and other measures to relive pain and suffering. May use oxygen, suction and manual treatment of airway obstruction as needed for comfort.      12/22/17 1630    Code Status History    Date Active Date Inactive Code Status Order ID Comments User Context   12/05/2016 1102 12/08/2016 1724 DNR [id number]  Hillary Bow, MD ED   10/05/2016 2302 10/10/2016 2354 Full Code [id number]  Lance Coon, MD Inpatient   06/10/2016 1032 06/10/2016 1050 Full Code [id number]  Theodoro Grist, MD Inpatient   09/14/2015 2019 09/16/2015 2034 Full Code [id number]  Theodoro Grist, MD Inpatient   08/27/2015 0805 09/01/2015 2042 Full Code [id number]  Harrie Foreman, MD Inpatient      TOTAL TIME TAKING CARE OF THIS PATIENT: 45  minutes.   Note: This dictation was prepared with Dragon dictation along with smaller phrase technology. Any transcriptional errors that result from this process are unintentional.   @[facility] @  on 12/28/2017 at 1:42 PM  Between 7am to 6pm - Pager - [phone]  After 6pm go to [email or web address] - password [email or web address] [facility]  Office  [phone]  CC: Primary care physician; Jodi Marble, MD

## 2017-12-28 NOTE — Clinical Social Work Note (Signed)
The patient will discharge today to [facility] via non-emergent EMS. The patient and the facility are aware and in agreement. The CSW has delivered the discharge packet and is signing off. Please consult should additional needs arise.  Santiago Bumpers, MSW, Latanya Presser [phone]

## 2017-12-28 NOTE — Clinical Social Work Placement (Signed)
   CLINICAL SOCIAL WORK PLACEMENT  NOTE  Date:  12/28/2017  Patient Details  Name: Debra [name] MRN: [id number] Date of Birth: [birth year]  Clinical Social Work is seeking post-discharge placement for this patient at the [place] level of care (*CSW will initial, date and re-position this form in  chart as items are completed):  Yes   Patient/family provided with [place] Work Department's list of facilities offering this level of care within the geographic area requested by the patient (or if unable, by the patient's family).  Yes   Patient/family informed of their freedom to choose among providers that offer the needed level of care, that participate in Medicare, Medicaid or managed care program needed by the patient, have an available bed and are willing to accept the patient.  Yes   Patient/family informed of [facility]'s ownership interest in [facility] and [facility], as well as of the fact that they are under no obligation to receive care at these facilities.  PASRR submitted to EDS on 12/27/17     PASRR number received on       Existing PASRR number confirmed on 12/27/17     FL2 transmitted to all facilities in geographic area requested by pt/family on 12/27/17     FL2 transmitted to all facilities within larger geographic area on       Patient informed that his/her managed care company has contracts with or will negotiate with certain facilities, including the following:        Yes   Patient/family informed of bed offers received.  Patient chooses bed at [facility]     Physician recommends and patient chooses bed at      Patient to be transferred to [facility] on  .  Patient to be transferred to facility by [facility]     Patient family notified on   of transfer.  Name of family member notified:        PHYSICIAN Please sign FL2, Please sign DNR     Additional Comment:     _______________________________________________ Zettie Pho, LCSW 12/28/2017, 10:28 AM

## 2017-12-28 NOTE — Discharge Instructions (Signed)
F/u with primary care physician at the facility in 3 to 5 days or sooner as needed

## 2018-01-22 NOTE — Progress Notes (Deleted)
[facility]     Assessment and Plan:  COPD with exacerbation.  -Appears to be recovering, but not yet back to baseline. -Continue current inhaled medications.  Nicotine abuse.  -She has not been smoking as she is currently in a rehabilitation center. -She is committed to quitting smoking, however, and does not want to restart when she goes home. We discussed the importance of not restarting smoking when she goes home. Greater than 3 minutes spent in smoking cessation discussion.  Dyspnea with chronic hypoxic respiratory failure.  -Continue on oxygen at 2 L.  Chronic systolic congestive heart failure. -Most recent echocardiogram showed ejection fraction equals 45%. -Likely contributing to dyspnea.  Date: 01/22/2018  MRN# [id number] Debra [name] 1933/06/21   Debra [name] is a 84 y.o. old female seen in consultation for chief complaint of:    No chief complaint on file.   HPI:  The patient is an 84 year old female with a history of COPD, she has previous hospital admissions for COPD exacerbation COPD exacerbation in October 2017 and  on 08/2015.  Last visit she was seen as a hospital discharge follow-up, she was in a rehab center, using Spiriva, Proventil, oxygen at 2 L.   Echocardiogram 06/10/16: Severe LVH, EF equals 45%  PMHX:   Past Medical History:  Diagnosis Date  . Asthma   . CHF (congestive heart failure) ([place])   . COPD (chronic obstructive pulmonary disease) ([place])   . Myocardial infarction [facility])    Surgical Hx:  Past Surgical History:  Procedure Laterality Date  . ABDOMINAL HYSTERECTOMY    . BREAST SURGERY    . BYPASS GRAFT  2007   triple  . CHOLECYSTECTOMY    . CORONARY ARTERY BYPASS GRAFT    . TONSILLECTOMY     Family Hx:  Family History  Problem Relation Age of Onset  . CAD Father    Social Hx:   Social History   Tobacco Use  . Smoking status: Former Smoker    Packs/day: 0.50    Years: 55.00    Pack years: 27.50   Last attempt to quit: 11/21/2017    Years since quitting: 0.1  . Smokeless tobacco: Never Used  Substance Use Topics  . Alcohol use: No  . Drug use: No   Medication:   Reviewed    Allergies:  Codeine  Review of Systems: Gen:  Denies  fever, sweats, chills HEENT: Denies blurred vision, double vision. bleeds, sore throat Cvc:  No dizziness, chest pain. Resp:   Denies cough or sputum production, shortness of breath Gi: Denies swallowing difficulty, stomach pain. Gu:  Denies bladder incontinence, burning urine Ext:   No Joint pain, stiffness. Skin: No skin rash,  hives  Endoc:  No polyuria, polydipsia. Psych: No depression, insomnia. Other:  All other systems were reviewed with the patient and were negative other that what is mentioned in the HPI.   Physical Examination:   VS: There were no vitals taken for this visit.  General Appearance: No distress  Neuro:without focal findings,  speech normal,  HEENT: PERRLA, EOM intact.   Pulmonary: normal breath sounds, No wheezing.  CardiovascularNormal S1,S2.  No m/r/g.   Abdomen: Benign, Soft, non-tender. Renal:  No costovertebral tenderness  GU:  No performed at this time. Endoc: No evident thyromegaly, no signs of acromegaly. Skin:   warm, no rashes, no ecchymosis  Extremities: normal, no cyanosis, clubbing.  Other findings:    LABORATORY PANEL:   CBC No results for input(s):  WBC, HGB, HCT, PLT in the last 168 hours. ------------------------------------------------------------------------------------------------------------------  Chemistries  No results for input(s): NA, K, CL, CO2, GLUCOSE, BUN, CREATININE, CALCIUM, MG, AST, ALT, ALKPHOS, BILITOT in the last 168 hours.  Invalid input(s): GFRCGP ------------------------------------------------------------------------------------------------------------------  Cardiac Enzymes No results for input(s): TROPONINI in the last 168  hours. ------------------------------------------------------------  RADIOLOGY:  No results found.     Thank  you for the consultation and for allowing [facility] to assist in the care of your patient. Our recommendations are noted above.  Please contact us if we can be of further service.   Marda Stalker, MD.  Board Certified in Internal Medicine, Pulmonary Medicine, [place], and Sleep Medicine.  [facility] Office Number: [phone]  Patricia Pesa, M.D.  Vilinda Boehringer, M.D.  Merton Border, M.D  01/22/2018

## 2018-01-24 ENCOUNTER — Ambulatory Visit: Payer: Medicare Other | Admitting: Internal Medicine

## 2018-01-25 ENCOUNTER — Emergency Department: Payer: Medicare Other

## 2018-01-25 ENCOUNTER — Other Ambulatory Visit: Payer: Self-pay

## 2018-01-25 ENCOUNTER — Encounter: Payer: Self-pay | Admitting: Emergency Medicine

## 2018-01-25 ENCOUNTER — Observation Stay
Admission: EM | Admit: 2018-01-25 | Discharge: 2018-01-27 | Disposition: A | Discharge status: Home / Self Care | Payer: Medicare Other | Attending: Internal Medicine | Admitting: Internal Medicine

## 2018-01-25 DIAGNOSIS — I5022 Chronic systolic (congestive) heart failure: Secondary | ICD-10-CM | POA: Diagnosis not present

## 2018-01-25 DIAGNOSIS — I951 Orthostatic hypotension: Principal | ICD-10-CM | POA: Insufficient documentation

## 2018-01-25 DIAGNOSIS — Z885 Allergy status to narcotic agent status: Secondary | ICD-10-CM | POA: Insufficient documentation

## 2018-01-25 DIAGNOSIS — Z7982 Long term (current) use of aspirin: Secondary | ICD-10-CM | POA: Diagnosis not present

## 2018-01-25 DIAGNOSIS — R42 Dizziness and giddiness: Secondary | ICD-10-CM

## 2018-01-25 DIAGNOSIS — Z87891 Personal history of nicotine dependence: Secondary | ICD-10-CM | POA: Insufficient documentation

## 2018-01-25 DIAGNOSIS — Z79899 Other long term (current) drug therapy: Secondary | ICD-10-CM | POA: Insufficient documentation

## 2018-01-25 DIAGNOSIS — I252 Old myocardial infarction: Secondary | ICD-10-CM | POA: Diagnosis not present

## 2018-01-25 DIAGNOSIS — I700 Atherosclerosis of aorta: Secondary | ICD-10-CM | POA: Insufficient documentation

## 2018-01-25 DIAGNOSIS — J9610 Chronic respiratory failure, unspecified whether with hypoxia or hypercapnia: Secondary | ICD-10-CM | POA: Diagnosis not present

## 2018-01-25 DIAGNOSIS — I959 Hypotension, unspecified: Secondary | ICD-10-CM | POA: Diagnosis present

## 2018-01-25 DIAGNOSIS — Z66 Do not resuscitate: Secondary | ICD-10-CM | POA: Diagnosis not present

## 2018-01-25 DIAGNOSIS — E860 Dehydration: Secondary | ICD-10-CM | POA: Insufficient documentation

## 2018-01-25 DIAGNOSIS — I110 Hypertensive heart disease with heart failure: Secondary | ICD-10-CM | POA: Insufficient documentation

## 2018-01-25 DIAGNOSIS — Z9981 Dependence on supplemental oxygen: Secondary | ICD-10-CM | POA: Diagnosis not present

## 2018-01-25 DIAGNOSIS — R2681 Unsteadiness on feet: Secondary | ICD-10-CM | POA: Diagnosis not present

## 2018-01-25 DIAGNOSIS — I2510 Atherosclerotic heart disease of native coronary artery without angina pectoris: Secondary | ICD-10-CM | POA: Diagnosis not present

## 2018-01-25 DIAGNOSIS — Z951 Presence of aortocoronary bypass graft: Secondary | ICD-10-CM | POA: Diagnosis not present

## 2018-01-25 DIAGNOSIS — N179 Acute kidney failure, unspecified: Secondary | ICD-10-CM | POA: Insufficient documentation

## 2018-01-25 DIAGNOSIS — Z7902 Long term (current) use of antithrombotics/antiplatelets: Secondary | ICD-10-CM | POA: Diagnosis not present

## 2018-01-25 LAB — BASIC METABOLIC PANEL
ANION GAP: 10 (ref 5–15)
BUN: 34 mg/dL — ABNORMAL HIGH (ref 6–20)
CALCIUM: 9 mg/dL (ref 8.9–10.3)
CO2: 31 mmol/L (ref 22–32)
Chloride: 93 mmol/L — ABNORMAL LOW (ref 101–111)
Creatinine, Ser: 1.34 mg/dL — ABNORMAL HIGH (ref 0.44–1.00)
GFR, EST AFRICAN AMERICAN: 41 mL/min — AB (ref >60)
GFR, EST NON AFRICAN AMERICAN: 36 mL/min — AB (ref >60)
Glucose, Bld: 121 mg/dL — ABNORMAL HIGH (ref 65–99)
Potassium: 4.5 mmol/L (ref 3.5–5.1)
Sodium: 134 mmol/L — ABNORMAL LOW (ref 135–145)

## 2018-01-25 LAB — URINALYSIS, COMPLETE (UACMP) WITH MICROSCOPIC
BILIRUBIN URINE: NEGATIVE
Bacteria, UA: NONE SEEN
GLUCOSE, UA: NEGATIVE mg/dL
HGB URINE DIPSTICK: NEGATIVE
Ketones, ur: NEGATIVE mg/dL
Leukocytes, UA: NEGATIVE
NITRITE: NEGATIVE
PH: 6 (ref 5.0–8.0)
Protein, ur: NEGATIVE mg/dL
SPECIFIC GRAVITY, URINE: 1.016 (ref 1.005–1.030)

## 2018-01-25 LAB — CBC WITH DIFFERENTIAL/PLATELET
BASOS PCT: 0 %
Basophils Absolute: 0 10*3/uL (ref 0–0.1)
Eosinophils Absolute: 0.2 10*3/uL (ref 0–0.7)
Eosinophils Relative: 2 %
HEMATOCRIT: 34.2 % — AB (ref 35.0–47.0)
Hemoglobin: 11.6 g/dL — ABNORMAL LOW (ref 12.0–16.0)
Lymphocytes Relative: 26 %
Lymphs Abs: 2.8 10*3/uL (ref 1.0–3.6)
MCH: 35 pg — ABNORMAL HIGH (ref 26.0–34.0)
MCHC: 33.8 g/dL (ref 32.0–36.0)
MCV: 103.5 fL — ABNORMAL HIGH (ref 80.0–100.0)
MONOS PCT: 10 %
Monocytes Absolute: 1.1 10*3/uL — ABNORMAL HIGH (ref 0.2–0.9)
Neutro Abs: 6.8 10*3/uL — ABNORMAL HIGH (ref 1.4–6.5)
Neutrophils Relative %: 62 %
PLATELETS: 322 10*3/uL (ref 150–440)
RBC: 3.31 MIL/uL — ABNORMAL LOW (ref 3.80–5.20)
RDW: 15.3 % — AB (ref 11.5–14.5)
WBC: 10.9 10*3/uL (ref 3.6–11.0)

## 2018-01-25 LAB — LACTIC ACID, PLASMA: Lactic Acid, Venous: 1.1 mmol/L (ref 0.5–1.9)

## 2018-01-25 MED ORDER — ENSURE ENLIVE PO LIQD
237.0000 mL | Freq: Two times a day (BID) | ORAL | Status: DC
  Administered 2018-01-26 – 2018-01-27 (×4): 237 mL via ORAL

## 2018-01-25 MED ORDER — TIOTROPIUM BROMIDE MONOHYDRATE 18 MCG IN CAPS
18.0000 ug | ORAL_CAPSULE | Freq: Every morning | RESPIRATORY_TRACT | Status: DC
  Administered 2018-01-26 – 2018-01-27 (×2): 18 ug via RESPIRATORY_TRACT
  Filled 2018-01-25: qty 5

## 2018-01-25 MED ORDER — HEPARIN SODIUM (PORCINE) 5000 UNIT/ML IJ SOLN
5000.0000 [IU] | Freq: Three times a day (TID) | INTRAMUSCULAR | Status: DC
  Administered 2018-01-25 – 2018-01-27 (×6): 5000 [IU] via SUBCUTANEOUS
  Filled 2018-01-25 (×6): qty 1

## 2018-01-25 MED ORDER — IPRATROPIUM-ALBUTEROL 0.5-2.5 (3) MG/3ML IN SOLN: 3.0000 mL | Freq: Four times a day (QID) | RESPIRATORY_TRACT | Status: DC | PRN

## 2018-01-25 MED ORDER — ASPIRIN 81 MG PO CHEW
81.0000 mg | CHEWABLE_TABLET | ORAL | Status: DC
  Administered 2018-01-26 – 2018-01-27 (×2): 81 mg via ORAL
  Filled 2018-01-25 (×2): qty 1

## 2018-01-25 MED ORDER — DOCUSATE SODIUM 100 MG PO CAPS: 100.0000 mg | ORAL_CAPSULE | Freq: Every day | ORAL | Status: DC | PRN

## 2018-01-25 MED ORDER — GUAIFENESIN 100 MG/5ML PO SOLN
5.0000 mL | ORAL | Status: DC | PRN
  Filled 2018-01-25: qty 5

## 2018-01-25 MED ORDER — CLOPIDOGREL BISULFATE 75 MG PO TABS
75.0000 mg | ORAL_TABLET | ORAL | Status: DC
  Administered 2018-01-27: 75 mg via ORAL
  Filled 2018-01-25 (×2): qty 1

## 2018-01-25 MED ORDER — CELECOXIB 200 MG PO CAPS
200.0000 mg | ORAL_CAPSULE | Freq: Two times a day (BID) | ORAL | Status: DC
  Administered 2018-01-25 – 2018-01-27 (×4): 200 mg via ORAL
  Filled 2018-01-25 (×4): qty 1

## 2018-01-25 MED ORDER — SODIUM CHLORIDE 0.9 % IV SOLN
INTRAVENOUS | Status: DC
  Administered 2018-01-25 – 2018-01-26 (×2): via INTRAVENOUS

## 2018-01-25 MED ORDER — HYDROCODONE-ACETAMINOPHEN 5-325 MG PO TABS
1.0000 | ORAL_TABLET | Freq: Four times a day (QID) | ORAL | Status: DC | PRN
  Administered 2018-01-27: 1 via ORAL
  Filled 2018-01-25: qty 1

## 2018-01-25 MED ORDER — ALPRAZOLAM 0.5 MG PO TABS
0.5000 mg | ORAL_TABLET | Freq: Two times a day (BID) | ORAL | Status: DC | PRN
  Administered 2018-01-27: 0.5 mg via ORAL
  Filled 2018-01-25: qty 1

## 2018-01-25 MED ORDER — ORAL CARE MOUTH RINSE: 15.0000 mL | Freq: Two times a day (BID) | OROMUCOSAL | Status: DC

## 2018-01-25 MED ORDER — ALBUTEROL SULFATE (2.5 MG/3ML) 0.083% IN NEBU: 2.5000 mg | INHALATION_SOLUTION | Freq: Four times a day (QID) | RESPIRATORY_TRACT | Status: DC | PRN

## 2018-01-25 MED ORDER — SENNOSIDES-DOCUSATE SODIUM 8.6-50 MG PO TABS: 1.0000 | ORAL_TABLET | Freq: Every evening | ORAL | Status: DC | PRN

## 2018-01-25 MED ORDER — ACETAMINOPHEN 325 MG PO TABS
650.0000 mg | ORAL_TABLET | Freq: Four times a day (QID) | ORAL | Status: DC | PRN
Start: 2018-01-25 — End: 2018-01-27

## 2018-01-25 MED ORDER — SODIUM CHLORIDE 0.9 % IV BOLUS
500.0000 mL | Freq: Once | INTRAVENOUS | Status: AC
  Administered 2018-01-25: 500 mL via INTRAVENOUS

## 2018-01-25 MED ORDER — FLUOXETINE HCL 20 MG PO CAPS
20.0000 mg | ORAL_CAPSULE | Freq: Every day | ORAL | Status: DC
  Administered 2018-01-26 – 2018-01-27 (×2): 20 mg via ORAL
  Filled 2018-01-25 (×3): qty 1

## 2018-01-25 MED ORDER — LATANOPROST 0.005 % OP SOLN
1.0000 [drp] | Freq: Every day | OPHTHALMIC | Status: DC
  Administered 2018-01-25 – 2018-01-26 (×2): 1 [drp] via OPHTHALMIC
  Filled 2018-01-25: qty 2.5

## 2018-01-25 MED ORDER — DOCUSATE SODIUM 100 MG PO CAPS: 100.0000 mg | ORAL_CAPSULE | Freq: Two times a day (BID) | ORAL | Status: DC | PRN

## 2018-01-25 MED ORDER — TIZANIDINE HCL 2 MG PO TABS
2.0000 mg | ORAL_TABLET | Freq: Three times a day (TID) | ORAL | Status: DC | PRN
  Filled 2018-01-25: qty 1

## 2018-01-25 MED ORDER — ATORVASTATIN CALCIUM 10 MG PO TABS
10.0000 mg | ORAL_TABLET | Freq: Every evening | ORAL | Status: DC
  Administered 2018-01-26: 10 mg via ORAL
  Filled 2018-01-25: qty 1

## 2018-01-25 NOTE — ED Notes (Signed)
Attempted to ambulate pt with her oxygen per dr order. Pt was orthostatic and symptomatic with standing. Dr made aware. Pt states unable to give urine sample at this time.

## 2018-01-25 NOTE — H&P (Addendum)
Debra [name] at [place] NAME: Debra [name]    MR#:  [id number]  DATE OF BIRTH:  [birth year]  DATE OF ADMISSION:  01/25/2018  PRIMARY CARE PHYSICIAN: Jodi Marble, MD   REQUESTING/REFERRING PHYSICIAN: [place]  CHIEF COMPLAINT:   Chief Complaint  Patient presents with  . Hypotension    HISTORY OF PRESENT ILLNESS: Debra [name]  is a 84 y.o. female with a known history of CHF- EF 45% in 2017, CAD, Htn, COPD- was in hospital last month for hypotension- discharged after stopping some BP meds, but advised to continue metoprolol. She was sent to rehab, not much improvement- sent home with walker and a wheelchair. For last 2-3 days again feeling dizzi and visiting nurse noted - she is hypotensive so advised to go to ER. Pt denies any other complains.  PAST MEDICAL HISTORY:   Past Medical History:  Diagnosis Date  . Asthma   . CHF (congestive heart failure) ([place])   . COPD (chronic obstructive pulmonary disease) ([place])   . Myocardial infarction [facility])     PAST SURGICAL HISTORY:  Past Surgical History:  Procedure Laterality Date  . ABDOMINAL HYSTERECTOMY    . BREAST SURGERY    . BYPASS GRAFT  2007   triple  . CHOLECYSTECTOMY    . CORONARY ARTERY BYPASS GRAFT    . TONSILLECTOMY      SOCIAL HISTORY:  Social History   Tobacco Use  . Smoking status: Former Smoker    Packs/day: 0.50    Years: 55.00    Pack years: 27.50    Last attempt to quit: 11/21/2017    Years since quitting: 0.1  . Smokeless tobacco: Never Used  Substance Use Topics  . Alcohol use: No    FAMILY HISTORY:  Family History  Problem Relation Age of Onset  . CAD Father     DRUG ALLERGIES:  Allergies  Allergen Reactions  . Codeine Itching and Rash    REVIEW OF SYSTEMS:   CONSTITUTIONAL: No fever, fatigue or weakness.  EYES: No blurred or double vision.  EARS, NOSE, AND THROAT: No tinnitus or ear pain.  RESPIRATORY: No cough, shortness of breath,  wheezing or hemoptysis.  CARDIOVASCULAR: No chest pain, orthopnea, edema.  GASTROINTESTINAL: No nausea, vomiting, diarrhea or abdominal pain.  GENITOURINARY: No dysuria, hematuria.  ENDOCRINE: No polyuria, nocturia,  HEMATOLOGY: No anemia, easy bruising or bleeding SKIN: No rash or lesion. MUSCULOSKELETAL: No joint pain or arthritis.   NEUROLOGIC: No tingling, numbness, weakness.  PSYCHIATRY: No anxiety or depression.   MEDICATIONS AT HOME:  Prior to Admission medications   Medication Sig Start Date End Date Taking? Authorizing Provider  albuterol (PROVENTIL) (2.5 MG/3ML) 0.083% nebulizer solution Take 2.5 mg by nebulization 4 (four) times daily as needed for wheezing or shortness of breath.   Yes [provider]  ALPRAZolam (XANAX) 0.5 MG tablet Take 1 tablet (0.5 mg total) by mouth 2 (two) times daily. 12/28/17  Yes Gouru, Illene Silver, MD  aspirin 81 MG chewable tablet Chew by mouth every morning.   Yes [provider]  atorvastatin (LIPITOR) 10 MG tablet Take 10 mg by mouth every evening. 05/16/16  Yes [provider]  Calcium Carbonate-Vitamin D (OYSTERCAL 500 + D PO) Take 1 tablet by mouth 2 (two) times daily.   Yes [provider]  celecoxib (CELEBREX) 200 MG capsule Take 200 mg by mouth 2 (two) times daily. 09/09/17  Yes [provider]  clopidogrel (PLAVIX) 75 MG tablet  Take 75 mg by mouth every morning.    Yes [provider]  docusate sodium (COLACE) 100 MG capsule Take 1 capsule (100 mg total) by mouth daily as needed for mild constipation. 12/28/17  Yes Gouru, Illene Silver, MD  feeding supplement, ENSURE ENLIVE, (ENSURE ENLIVE) LIQD Take 237 mLs by mouth 2 (two) times daily between meals. 12/08/16  Yes Mody, Ulice Bold, MD  FLUoxetine (PROZAC) 20 MG capsule Take 1 capsule (20 mg total) by mouth daily. 09/01/15  Yes Aldean Jewett, MD  furosemide (LASIX) 40 MG tablet Take 1 tablet (40 mg total) by mouth daily. Start on Debra 4/3 12/08/16  Yes  Mody, Sital, MD  ipratropium-albuterol (DUONEB) 0.5-2.5 (3) MG/3ML SOLN Take 3 mLs by nebulization every 6 (six) hours as needed. 12/28/17  Yes Gouru, Aruna, MD  latanoprost (XALATAN) 0.005 % ophthalmic solution Place 1 drop into both eyes at bedtime. 05/10/16  Yes [provider]  metoprolol (LOPRESSOR) 25 MG tablet Take 1 tablet (25 mg total) by mouth 2 (two) times daily. 10/10/16  Yes Gouru, Illene Silver, MD  tiotropium (SPIRIVA HANDIHALER) 18 MCG inhalation capsule Place 1 capsule (18 mcg total) into inhaler and inhale every morning. 09/01/15  Yes Aldean Jewett, MD  acetaminophen (TYLENOL) 325 MG tablet Take 2 tablets (650 mg total) by mouth every 6 (six) hours as needed for mild pain (or Fever >/= 101). Patient not taking: Reported on 10/22/2017 10/10/16   Nicholes Mango, MD  guaiFENesin (ROBITUSSIN) 100 MG/5ML SOLN Take 5 mLs (100 mg total) by mouth every 4 (four) hours as needed for cough or to loosen phlegm. 12/28/17   Nicholes Mango, MD  HYDROcodone-acetaminophen (NORCO/VICODIN) 5-325 MG tablet Take 1 tablet by mouth every 6 (six) hours as needed for moderate pain. 12/28/17   Gouru, Illene Silver, MD  predniSONE (STERAPRED UNI-PAK 21 TAB) 10 MG (21) TBPK tablet Take 1 tablet (10 mg total) by mouth daily. Take 6 tablets by mouth for 1 day followed by  5 tablets by mouth for 1 day followed by  4 tablets by mouth for 1 day followed by  3 tablets by mouth for 1 day followed by  2 tablets by mouth for 1 day followed by  1 tablet by mouth for a day and stop Patient not taking: Reported on 01/25/2018 12/28/17   Nicholes Mango, MD  senna-docusate (SENOKOT-S) 8.6-50 MG tablet Take 1 tablet by mouth at bedtime as needed for mild constipation. 12/28/17   Gouru, Illene Silver, MD  tiZANidine (ZANAFLEX) 2 MG tablet Take 1 tablet (2 mg total) by mouth 3 (three) times daily as needed for muscle spasms. 12/28/17   Nicholes Mango, MD      PHYSICAL EXAMINATION:   VITAL SIGNS: Blood pressure 123/64, pulse (!) 57, temperature 97.6  F (36.4 C), temperature source Oral, resp. rate (!) 23, height 5\' 1"  (1.549 m), weight 74.4 kg (164 lb), SpO2 97 %.  GENERAL:  84 y.o.-year-old patient lying in the bed with no acute distress.  EYES: Pupils equal, round, reactive to light and accommodation. No scleral icterus. Extraocular muscles intact.  HEENT: Head atraumatic, normocephalic. Oropharynx and nasopharynx clear.  NECK:  Supple, no jugular venous distention. No thyroid enlargement, no tenderness.  LUNGS: Normal breath sounds bilaterally, no wheezing, rales,rhonchi or crepitation. No use of accessory muscles of respiration.  CARDIOVASCULAR: S1, S2 normal. No murmurs, rubs, or gallops.  ABDOMEN: Soft, nontender, nondistended. Bowel sounds present. No organomegaly or mass.  EXTREMITIES: No pedal edema, cyanosis, or clubbing.  NEUROLOGIC: Cranial nerves II  through XII are intact. Muscle strength 4/5 in all extremities. Sensation intact. Gait not checked.  PSYCHIATRIC: The patient is alert and oriented x 3.  SKIN: No obvious rash, lesion, or ulcer.   LABORATORY PANEL:   CBC Recent Labs  Lab 01/25/18 1059  WBC 10.9  HGB 11.6*  HCT 34.2*  PLT 322  MCV 103.5*  MCH 35.0*  MCHC 33.8  RDW 15.3*  LYMPHSABS 2.8  MONOABS 1.1*  EOSABS 0.2  BASOSABS 0.0   ------------------------------------------------------------------------------------------------------------------  Chemistries  Recent Labs  Lab 01/25/18 1059  NA 134*  K 4.5  CL 93*  CO2 31  GLUCOSE 121*  BUN 34*  CREATININE 1.34*  CALCIUM 9.0   ------------------------------------------------------------------------------------------------------------------ estimated creatinine clearance is 29.3 mL/min (A) (by C-G formula based on SCr of 1.34 mg/dL (H)). ------------------------------------------------------------------------------------------------------------------ No results for input(s): TSH, T4TOTAL, T3FREE, THYROIDAB in the last 72 hours.  Invalid  input(s): FREET3   Coagulation profile No results for input(s): INR, PROTIME in the last 168 hours. ------------------------------------------------------------------------------------------------------------------- No results for input(s): DDIMER in the last 72 hours. -------------------------------------------------------------------------------------------------------------------  Cardiac Enzymes No results for input(s): CKMB, TROPONINI, MYOGLOBIN in the last 168 hours.  Invalid input(s): CK ------------------------------------------------------------------------------------------------------------------ Invalid input(s): POCBNP  ---------------------------------------------------------------------------------------------------------------  Urinalysis    Component Value Date/Time   COLORURINE YELLOW (A) 01/25/2018 1519   APPEARANCEUR CLEAR (A) 01/25/2018 1519   APPEARANCEUR Cloudy 09/13/2014 1625   LABSPEC 1.016 01/25/2018 1519   LABSPEC 1.027 09/13/2014 1625   PHURINE 6.0 01/25/2018 1519   GLUCOSEU NEGATIVE 01/25/2018 1519   GLUCOSEU Negative 09/13/2014 1625   HGBUR NEGATIVE 01/25/2018 1519   BILIRUBINUR NEGATIVE 01/25/2018 1519   BILIRUBINUR Negative 09/13/2014 1625   KETONESUR NEGATIVE 01/25/2018 1519   PROTEINUR NEGATIVE 01/25/2018 1519   NITRITE NEGATIVE 01/25/2018 1519   LEUKOCYTESUR NEGATIVE 01/25/2018 1519   LEUKOCYTESUR Negative 09/13/2014 1625     RADIOLOGY: Dg Chest 2 View  Result Date: 01/25/2018 CLINICAL DATA:  Decreased O2 sats, hypotension, weakness. EXAM: CHEST - 2 VIEW COMPARISON:  12/22/2017, 10/22/2017 and 12/07/2016. FINDINGS: Trachea is midline. Heart is enlarged, stable. Thoracic aorta is calcified. Mild prominence of the pulmonary markings appears chronic. No airspace consolidation or pleural fluid. Vertebral body augmentations. Lower thoracic or upper lumbar mild compression deformities, as on 10/22/2017. IMPRESSION: 1. No acute findings. 2.   Aortic atherosclerosis (ICD10-170.0). Electronically Signed   By: Lorin Picket M.D.   On: 01/25/2018 11:54    EKG: Orders placed or performed during the hospital encounter of 01/25/18  . EKG 12-Lead  . EKG 12-Lead  . EKG 12-Lead  . EKG 12-Lead    IMPRESSION AND PLAN:  * Hypotension causing dizziness   Hold metoprolol and lasix, she may not need it on discharge   Monitor on tele.   Get Echocardiogram and monitor orthostatic vitals  * CAD   Cont ASA, Plavix, lipitor   No betablocker due to hypotension  * COPD   Cont home inhalers and nebs  * anxiety   Cont xanax  * Chronic back pain   Cont oxycodone  * Ch respi failure   Due to COPD on nasal canula oxygen  * Ch systolic CHF   Stable, Stop lasix.  All the records are reviewed and case discussed with ED provider. Management plans discussed with the patient, family and they are in agreement.  CODE STATUS: DNR Code Status History    Date Active Date Inactive Code Status Order ID Comments User Context   12/22/2017 1631 12/28/2017 2218 DNR [id number]  Bridgett Larsson,  Sheppard Evens, [facility]   12/05/2016 1102 12/08/2016 1724 DNR [id number]  Hillary Bow, MD ED   10/05/2016 2302 10/10/2016 2354 Full Code [id number]  Lance Coon, MD Inpatient   06/10/2016 1032 06/10/2016 1050 Full Code [id number]  Theodoro Grist, MD Inpatient   09/14/2015 2019 09/16/2015 2034 Full Code [id number]  Theodoro Grist, MD Inpatient   08/27/2015 0805 09/01/2015 2042 Full Code [id number]  Harrie Foreman, MD Inpatient    Questions for Most Recent Historical Code Status (Order [id number])    Question Answer Comment   In the event of cardiac or respiratory ARREST Do not call a "code blue"    In the event of cardiac or respiratory ARREST Do not perform Intubation, CPR, defibrillation or ACLS    In the event of cardiac or respiratory ARREST Use medication by any route, position, wound care, and other measures to relive pain and suffering. May use oxygen, suction and manual  treatment of airway obstruction as needed for comfort.        TOTAL TIME TAKING CARE OF THIS PATIENT: 50 minutes.  Spoke to daughter in room.  [name] M.D on 01/25/2018   Between 7am to 6pm - Pager - [phone]  After 6pm go to [email or web address] - password [email or web address] [facility]  Office  [phone]  CC: Primary care physician; Jodi Marble, MD   Note: This dictation was prepared with Dragon dictation along with smaller phrase technology. Any transcriptional errors that result from this process are unintentional.

## 2018-01-25 NOTE — ED Notes (Signed)
Patient placed on 2L [place] which is what she uses at home.

## 2018-01-25 NOTE — ED Notes (Signed)
Pt given lunch tray.

## 2018-01-25 NOTE — ED Provider Notes (Signed)
-----------------------------------------   4:15 PM on 01/25/2018 -----------------------------------------  I took over care of this patient from Dr. Archie Balboa.  The patient continues to have hypotension after fluids, and continues to feel orthostatic.  UA is negative.Patient's other vital signs are stable.  Given the persistent hypotension, patient will require admission for further monitoring and IV hydration.  I signed the patient out to the hospitalist.   Arta Silence, MD 01/25/18 1615

## 2018-01-25 NOTE — ED Triage Notes (Addendum)
Patient presents to the ED via EMS from home for complaint of hypotension and weakness.  Patient was recently discharged from rehab and is being seen by home health.  Patient was at rehab for breathing problems per EMS.  Per EMS, sitting and standing to transfer onto EMS stretcher caused patient's oxygen saturation to drop to 81%.  EMS placed patient on 4L.  Patient fell on Wednesday and hit her head but did not get care post fall.  Patient did take metoprolol this morning.

## 2018-01-25 NOTE — ED Provider Notes (Signed)
[facility] Emergency Department Provider Note  ____________________________________________   I have reviewed the triage vital signs and the nursing notes.   HISTORY  Chief Complaint Dizziness  History limited by: Not Limited   HPI Debra [name] is a 84 y.o. female who presents to the emergency department today because of concerns for dizziness.  She states she has been dizzy for the past few days.  It does come and go.  Is feeling lightheadedness.  She recently had admission for breathing difficulty and COPD and then went to rehab.  The patient did have a fall 3 days ago but denies hitting her head or any subsequent headache or pain.  She denies any new shortness of breath but does have some chronic shortness of breath and is on 3 L.  She states that she does turn it down to 2 L from time to time because it feels like it blows too much.  She denies any urinary difficulty or change in urination. She does state that she took her blood pressure medication this morning.    Per medical record review patient has a history of COPD, CHF.  Past Medical History:  Diagnosis Date  . Asthma   . CHF (congestive heart failure) ([place])   . COPD (chronic obstructive pulmonary disease) ([place])   . Myocardial infarction [facility])     Patient Active Problem List   Diagnosis Date Noted  . Moderate recurrent major depression ([place]) 12/25/2017  . Acute adjustment disorder with anxiety 12/25/2017  . Somatic symptom disorder 12/25/2017  . Acute respiratory failure with hypoxia ([place]) 12/22/2017  . CHF (congestive heart failure) ([place]) 12/05/2016  . CAP (community acquired pneumonia) 10/05/2016  . Chronic systolic heart failure ([place]) 06/28/2016  . COPD (chronic obstructive pulmonary disease) with emphysema ([place]) 06/28/2016  . Tobacco use 06/28/2016  . Acidosis 09/14/2015  . Hypotension 09/14/2015  . Hypokalemia 09/14/2015  . COPD exacerbation ([place]) 09/01/2015  . Sepsis ([place])  08/27/2015    Past Surgical History:  Procedure Laterality Date  . ABDOMINAL HYSTERECTOMY    . BREAST SURGERY    . BYPASS GRAFT  2007   triple  . CHOLECYSTECTOMY    . CORONARY ARTERY BYPASS GRAFT    . TONSILLECTOMY      Prior to Admission medications   Medication Sig Start Date End Date Taking? Authorizing Provider  acetaminophen (TYLENOL) 325 MG tablet Take 2 tablets (650 mg total) by mouth every 6 (six) hours as needed for mild pain (or Fever >/= 101). Patient not taking: Reported on 10/22/2017 10/10/16   Nicholes Mango, MD  albuterol (PROVENTIL) (2.5 MG/3ML) 0.083% nebulizer solution Take 2.5 mg by nebulization 4 (four) times daily as needed for wheezing or shortness of breath.    [provider]  ALPRAZolam Duanne Moron) 0.5 MG tablet Take 1 tablet (0.5 mg total) by mouth 2 (two) times daily. 12/28/17   Nicholes Mango, MD  aspirin 81 MG chewable tablet Chew by mouth every morning.    [provider]  atorvastatin (LIPITOR) 10 MG tablet Take 10 mg by mouth every evening. 05/16/16   [provider]  Calcium Carbonate-Vitamin D (OYSTERCAL 500 + D PO) Take 1 tablet by mouth 2 (two) times daily.    [provider]  celecoxib (CELEBREX) 200 MG capsule Take 200 mg by mouth 2 (two) times daily. 09/09/17   [provider]  clopidogrel (PLAVIX) 75 MG tablet Take 75 mg by mouth every morning.     [provider]  docusate sodium (COLACE) 100 MG capsule Take 1 capsule (100 mg total) by mouth daily as needed for mild constipation. 12/28/17   Gouru, Illene Silver, MD  feeding supplement, ENSURE ENLIVE, (ENSURE ENLIVE) LIQD Take 237 mLs by mouth 2 (two) times daily between meals. 12/08/16   Bettey Costa, MD  FLUoxetine (PROZAC) 20 MG capsule Take 1 capsule (20 mg total) by mouth daily. 09/01/15   Aldean Jewett, MD  furosemide (LASIX) 40 MG tablet Take 1 tablet (40 mg total) by mouth daily. Start on TUesday 4/3 12/08/16   Bettey Costa, MD  guaiFENesin (ROBITUSSIN) 100  MG/5ML SOLN Take 5 mLs (100 mg total) by mouth every 4 (four) hours as needed for cough or to loosen phlegm. 12/28/17   Nicholes Mango, MD  HYDROcodone-acetaminophen (NORCO/VICODIN) 5-325 MG tablet Take 1 tablet by mouth every 6 (six) hours as needed for moderate pain. 12/28/17   Gouru, Illene Silver, MD  ipratropium-albuterol (DUONEB) 0.5-2.5 (3) MG/3ML SOLN Take 3 mLs by nebulization every 6 (six) hours as needed. 12/28/17   Gouru, Illene Silver, MD  latanoprost (XALATAN) 0.005 % ophthalmic solution Place 1 drop into both eyes at bedtime. 05/10/16   [provider]  metoprolol (LOPRESSOR) 25 MG tablet Take 1 tablet (25 mg total) by mouth 2 (two) times daily. 10/10/16   Gouru, Illene Silver, MD  predniSONE (STERAPRED UNI-PAK 21 TAB) 10 MG (21) TBPK tablet Take 1 tablet (10 mg total) by mouth daily. Take 6 tablets by mouth for 1 day followed by  5 tablets by mouth for 1 day followed by  4 tablets by mouth for 1 day followed by  3 tablets by mouth for 1 day followed by  2 tablets by mouth for 1 day followed by  1 tablet by mouth for a day and stop 12/28/17   Gouru, Illene Silver, MD  senna-docusate (SENOKOT-S) 8.6-50 MG tablet Take 1 tablet by mouth at bedtime as needed for mild constipation. 12/28/17   Gouru, Illene Silver, MD  tiotropium (SPIRIVA HANDIHALER) 18 MCG inhalation capsule Place 1 capsule (18 mcg total) into inhaler and inhale every morning. 09/01/15   Aldean Jewett, MD  tiZANidine (ZANAFLEX) 2 MG tablet Take 1 tablet (2 mg total) by mouth 3 (three) times daily as needed for muscle spasms. 12/28/17   Nicholes Mango, MD    Allergies Codeine  Family History  Problem Relation Age of Onset  . CAD Father     Social History Social History   Tobacco Use  . Smoking status: Former Smoker    Packs/day: 0.50    Years: 55.00    Pack years: 27.50    Last attempt to quit: 11/21/2017    Years since quitting: 0.1  . Smokeless tobacco: Never Used  Substance Use Topics  . Alcohol use: No  . Drug use: No    Review of  Systems Constitutional: No fever/chills Eyes: No visual changes. ENT: No sore throat. Cardiovascular: Denies chest pain. Respiratory: Denies shortness of breath. Gastrointestinal: No abdominal pain.  No nausea, no vomiting.  No diarrhea.   Genitourinary: Negative for dysuria. Musculoskeletal: Negative for back pain. Skin: Negative for rash. Neurological: Positive for lightheadedness.  ____________________________________________   PHYSICAL EXAM:  VITAL SIGNS: ED Triage Vitals  Enc Vitals Group     BP 01/25/18 1048 (!) 82/71     Pulse Rate 01/25/18 1048 (!) 54     Resp 01/25/18 1048 (!) 25     Temp 01/25/18 1048 97.6 F (36.4 C)     Temp Source 01/25/18 1048  Oral     SpO2 01/25/18 1048 (!) 88 %     Weight 01/25/18 1051 164 lb (74.4 kg)     Height 01/25/18 1051 5\' 1"  (1.549 m)     Head Circumference --      Peak Flow --      Pain Score 01/25/18 1051 0   Constitutional: Alert and oriented. Well appearing and in no distress. Eyes: Conjunctivae are normal.  ENT   Head: Normocephalic and atraumatic.   Nose: No congestion/rhinnorhea.   Mouth/Throat: Mucous membranes are moist.   Neck: No stridor. Hematological/Lymphatic/Immunilogical: No cervical lymphadenopathy. Cardiovascular: Bradycardic, regular rhythm.  No murmurs, rubs, or gallops.  Respiratory: Normal respiratory effort without tachypnea nor retractions. Breath sounds are clear and equal bilaterally. No wheezes/rales/rhonchi. Gastrointestinal: Soft and non tender. No rebound. No guarding.  Genitourinary: Deferred Musculoskeletal: Normal range of motion in all extremities. No lower extremity edema. Neurologic:  Normal speech and language. No gross focal neurologic deficits are appreciated.  Skin:  Skin is warm, dry and intact. No rash noted. Psychiatric: Mood and affect are normal. Speech and behavior are normal. Patient exhibits appropriate insight and  judgment.  ____________________________________________    LABS (pertinent positives/negatives)  Lactic 1.1 CBC wbc 10.9, hgb 11.6, plt 322 BMP na 134, cl 93, glu 121, cr 1.34 ____________________________________________   EKG  I, Nance Pear, attending physician, personally viewed and interpreted this EKG  EKG Time: 1048 Rate: 56 Rhythm: sinus bradycardia  Axis: normal Intervals: qtc 431 QRS: narrow, q waves V1 ST changes: no st elevation Impression: abnormal ekg  ____________________________________________    RADIOLOGY  CXR No pneumonia   ____________________________________________   PROCEDURES  Procedures  ____________________________________________   INITIAL IMPRESSION / ASSESSMENT AND PLAN / ED COURSE  Pertinent labs & imaging results that were available during my care of the patient were reviewed by me and considered in my medical decision making (see chart for details).  Patient presents to the emergency department today because of concerns for dizziness and weakness since returning home from rehab.  Differential would be broad including anemia, infection, hypoxia, orthostatic hypotension, medication side effect amongst other etiologies.  Patient will be given gentle fluids.  Blood work without any significant anemia.  Patient without any leukocytosis.  Awaiting urine at time of signout.    ____________________________________________   FINAL CLINICAL IMPRESSION(S) / ED DIAGNOSES  Dizziness Hypotension  Note: This dictation was prepared with Dragon dictation. Any transcriptional errors that result from this process are unintentional     Nance Pear, MD 01/26/18 1512

## 2018-01-26 ENCOUNTER — Observation Stay
Admit: 2018-01-26 | Discharge: 2018-01-26 | Disposition: A | Discharge status: Home / Self Care | Payer: Medicare Other | Attending: Internal Medicine | Admitting: Internal Medicine

## 2018-01-26 ENCOUNTER — Other Ambulatory Visit: Payer: Self-pay

## 2018-01-26 LAB — ECHOCARDIOGRAM COMPLETE
Height: 61 in
WEIGHTICAEL: 2624 [oz_av]

## 2018-01-26 LAB — BASIC METABOLIC PANEL
Anion gap: 5 (ref 5–15)
BUN: 29 mg/dL — AB (ref 6–20)
CHLORIDE: 100 mmol/L — AB (ref 101–111)
CO2: 33 mmol/L — AB (ref 22–32)
CREATININE: 1.09 mg/dL — AB (ref 0.44–1.00)
Calcium: 8.4 mg/dL — ABNORMAL LOW (ref 8.9–10.3)
GFR calc non Af Amer: 46 mL/min — ABNORMAL LOW (ref >60)
GFR, EST AFRICAN AMERICAN: 53 mL/min — AB (ref >60)
Glucose, Bld: 97 mg/dL (ref 65–99)
POTASSIUM: 4.9 mmol/L (ref 3.5–5.1)
Sodium: 138 mmol/L (ref 135–145)

## 2018-01-26 LAB — CBC
HEMATOCRIT: 33.9 % — AB (ref 35.0–47.0)
Hemoglobin: 11.6 g/dL — ABNORMAL LOW (ref 12.0–16.0)
MCH: 35.4 pg — AB (ref 26.0–34.0)
MCHC: 34.1 g/dL (ref 32.0–36.0)
MCV: 103.6 fL — AB (ref 80.0–100.0)
PLATELETS: 276 10*3/uL (ref 150–440)
RBC: 3.27 MIL/uL — AB (ref 3.80–5.20)
RDW: 15.2 % — ABNORMAL HIGH (ref 11.5–14.5)
WBC: 8.6 10*3/uL (ref 3.6–11.0)

## 2018-01-26 MED ORDER — ONDANSETRON HCL 4 MG/2ML IJ SOLN
4.0000 mg | Freq: Four times a day (QID) | INTRAMUSCULAR | Status: DC | PRN
Start: 2018-01-26 — End: 2018-01-27
  Administered 2018-01-26: 4 mg via INTRAVENOUS
  Filled 2018-01-26: qty 2

## 2018-01-26 MED ORDER — MECLIZINE HCL 12.5 MG PO TABS
12.5000 mg | ORAL_TABLET | Freq: Three times a day (TID) | ORAL | Status: DC
  Administered 2018-01-26 – 2018-01-27 (×4): 12.5 mg via ORAL
  Filled 2018-01-26 (×5): qty 1

## 2018-01-26 NOTE — Care Management Note (Signed)
Case Management Note  Patient Details  Name: Debra [name] MRN: [id number] Date of Birth: 1933-06-23  Subjective/Objective:   Admitted to [facility] under observation status with the diagnosis of dizziness. Granddaughter [facility] lives in the home, Daughter is Chrissa [phone]), Last seen Dr. Westley Gambles April 1st. Prescriptions are filled at [facility]. No Home Health. Skilled nursing per [facility] x 2, Just discharged from [facility] 01/17/18. [place] x 2. Home oxygen per [facility] x 3 years. Uses 2-3 liters continuous. Home nebulizer, BP cuff, Saturation reader, rolling walker and wheelchair in the home. Self feed. Granddaughter helps with dressing and baths. Last fall was Wednesday. Good appetite. Family will transport.                 Action/Plan: Will continue to follow for discharge plans  Expected Discharge Date:                  Expected Discharge Plan:     In-House Referral:     Discharge planning Services     Post Acute Care Choice:    Choice offered to:     DME Arranged:    DME Agency:     HH Arranged:    HH Agency:     Status of Service:     If discussed at [employer] of Stay Meetings, dates discussed:    Additional Comments:  Shelbie Ammons, RN MSN CCM Care Management [phone] 01/26/2018, 10:01 AM

## 2018-01-26 NOTE — Progress Notes (Signed)
[facility] [place] NAME: Debra [name]    MR#:  [id number]  DATE OF BIRTH:  1933/03/01  SUBJECTIVE:   Came in after having dizziness on and off with a fall last week. c/o of some noise in her ears. Denies any respiratory tract infection. Patient tells me the room started spinning when she is trying to get up REVIEW OF SYSTEMS:   Review of Systems  Constitutional: Negative for chills, fever and weight loss.  HENT: Positive for tinnitus. Negative for ear discharge, ear pain and nosebleeds.   Eyes: Negative for blurred vision, pain and discharge.  Respiratory: Negative for sputum production, shortness of breath, wheezing and stridor.   Cardiovascular: Negative for chest pain, palpitations, orthopnea and PND.  Gastrointestinal: Negative for abdominal pain, diarrhea, nausea and vomiting.  Genitourinary: Negative for frequency and urgency.  Musculoskeletal: Negative for back pain and joint pain.  Neurological: Positive for dizziness. Negative for sensory change, speech change, focal weakness and weakness.  Psychiatric/Behavioral: Negative for depression and hallucinations. The patient is not nervous/anxious.    Tolerating Diet:yesTolerating PT: yes  DRUG ALLERGIES:   Allergies  Allergen Reactions  . Codeine Itching and Rash    VITALS:  Blood pressure 112/71, pulse (!) 57, temperature 98.3 F (36.8 C), resp. rate 18, height 5\' 1"  (1.549 m), weight 74.4 kg (164 lb), SpO2 97 %.  PHYSICAL EXAMINATION:   Physical Exam  GENERAL:  84 y.o.-year-old patient lying in the bed with no acute distress.  EYES: Pupils equal, round, reactive to light and accommodation. No scleral icterus. Extraocular muscles intact.  HEENT: Head atraumatic, normocephalic. Oropharynx and nasopharynx clear.  NECK:  Supple, no jugular venous distention. No thyroid enlargement, no tenderness.  LUNGS: Normal breath sounds bilaterally, no wheezing, rales, rhonchi.  No use of accessory muscles of respiration.  CARDIOVASCULAR: S1, S2 normal. No murmurs, rubs, or gallops.  ABDOMEN: Soft, nontender, nondistended. Bowel sounds present. No organomegaly or mass.  EXTREMITIES: No cyanosis, clubbing or edema b/l.    NEUROLOGIC: Cranial nerves II through XII are intact. No focal Motor or sensory deficits b/l.   PSYCHIATRIC:  patient is alert and oriented x 3.  SKIN: No obvious rash, lesion, or ulcer.   LABORATORY PANEL:  CBC Recent Labs  Lab 01/26/18 0427  WBC 8.6  HGB 11.6*  HCT 33.9*  PLT 276    Chemistries  Recent Labs  Lab 01/26/18 0427  NA 138  K 4.9  CL 100*  CO2 33*  GLUCOSE 97  BUN 29*  CREATININE 1.09*  CALCIUM 8.4*   Cardiac Enzymes No results for input(s): TROPONINI in the last 168 hours. RADIOLOGY:  Dg Chest 2 View  Result Date: 01/25/2018 CLINICAL DATA:  Decreased O2 sats, hypotension, weakness. EXAM: CHEST - 2 VIEW COMPARISON:  12/22/2017, 10/22/2017 and 12/07/2016. FINDINGS: Trachea is midline. Heart is enlarged, stable. Thoracic aorta is calcified. Mild prominence of the pulmonary markings appears chronic. No airspace consolidation or pleural fluid. Vertebral body augmentations. Lower thoracic or upper lumbar mild compression deformities, as on 10/22/2017. IMPRESSION: 1. No acute findings. 2.  Aortic atherosclerosis (ICD10-170.0). Electronically Signed   By: Lorin Picket M.D.   On: 01/25/2018 11:54   ASSESSMENT AND PLAN:  Debra [name]  is a 84 y.o. female with a known history of CHF- EF 45% in 2017, CAD, Htn, COPD- was in hospital last month for hypotension- discharged after stopping some BP meds, but advised to continue metoprolol. She was sent to rehab,  not much improvement- sent home with walker and a wheelchair. For last 2-3 days again feeling dizzi and visiting nurse noted - she is hypotensive so advised to go to ER.  * Hypotension causing dizziness   Hold metoprolol and lasix, she may not need it on discharge  -echo  looks within normal limits. EF 55 to 60%.  -Patient appeared to be dehydrated from labs. Her creatinine was 1.34.----Received IV fluids. Creatinine is down to 1.0 -pressure much improved  *Suspected vertigo -patient has some symptoms of tinnitus. She denies any URI. -Will give trial of meclizine 12.5 mg TID. If no improvement then consider MRI of the brain  * CAD   Cont ASA, Plavix, lipitor   No betablocker due to hypotension  * COPD   Cont home inhalers and nebs  * anxiety   Cont xanax  * Chronic back pain   Cont oxycodone  * Ch respi failure   Due to COPD on nasal canula oxygen  * Ch systolic CHF   Stable, Stop lasix.  this was discussed with patient and patient's son in the room   Case discussed with Care Management/Social Worker. Management plans discussed with the patient, family and they are in agreement.  CODE STATUS: full  DVT Prophylaxis: lovenox  TOTAL TIME TAKING CARE OF THIS PATIENT: *40* minutes.  >50% time spent on counselling and coordination of care  POSSIBLE D/C IN *1-2 DAYS, DEPENDING ON CLINICAL CONDITION.  Note: This dictation was prepared with Dragon dictation along with smaller phrase technology. Any transcriptional errors that result from this process are unintentional.  Debra [name] M.D on 01/26/2018 at 1:32 PM  Between 7am to 6pm - Pager - [phone]  After 6pm go to [email or web address] - password [email or web address] [facility]  Office  [phone]  CC: Primary care physician; Jodi Marble, MDPatient ID: Debra [name], female   DOB: 20-Dec-1932, 84 y.o.   MRN: [id number]

## 2018-01-26 NOTE — Care Management Obs Status (Signed)
[facility] NOTIFICATION   Patient Details  Name: Debra [name] MRN: [id number] Date of Birth: Feb 14, 1933   Medicare Observation Status Notification Given:  Yes permission to x    Shelbie Ammons, RN 01/26/2018, 10:09 AM

## 2018-01-27 ENCOUNTER — Observation Stay: Payer: Medicare Other

## 2018-01-27 MED ORDER — MECLIZINE HCL 12.5 MG PO TABS: 12.5000 mg | ORAL_TABLET | Freq: Three times a day (TID) | ORAL | 0 refills | Status: AC | PRN

## 2018-01-27 MED ORDER — METOPROLOL TARTRATE 25 MG PO TABS: 25.0000 mg | ORAL_TABLET | Freq: Every day | ORAL | 0 refills | Status: AC

## 2018-01-27 NOTE — Care Management (Signed)
Ronalee Belts with [facility] health updated that patient has discharge to home order in place.  No other RNCM needs.

## 2018-01-27 NOTE — Care Management (Signed)
RNCM met with patient and oldest son Major regarding transition of care. Patient states room is spinning but "not as bad".  MRI pending. Feel sick on stomach.She has limited mobility at home but does does get up for short distances to bathroom with walker. Sometimes she is alone at home but her grandaughter lives with herSharyn [name].  She is on chronic O2 at [facility]. She has home health which is who called 911 to get patient to hospital.  They do not know the name of agency. RNCM will continue to follow.

## 2018-01-27 NOTE — Progress Notes (Signed)
Discharge instructions and prescriptions given to pt. DNR form given back to pt and daughter. IV removed. Pt dressed, daughter arrived for transport and has patient's home oxygen. No other questions or concerns at this time.

## 2018-01-27 NOTE — Progress Notes (Signed)
PRN xanax given before MRI.

## 2018-01-27 NOTE — Clinical Social Work Note (Signed)
Clinical Social Work Assessment  Patient Details  Name: Debra [name] MRN: [id number] Date of Birth: Apr 17, 1933  Date of referral:  01/27/18               Reason for consult:  Discharge Planning                Permission sought to share information with:    Permission granted to share information::     Name::        Agency::     Relationship::     Contact Information:     Housing/Transportation Living arrangements for the past 2 months:  Single Family Home Source of Information:  Patient Patient Interpreter Needed:  None Criminal Activity/Legal Involvement Pertinent to Current Situation/Hospitalization:  No - Comment as needed Significant Relationships:  Adult Children, Other(Comment)(granddaughter [name]. ) Lives with:  Other (Comment)(granddaughter ) Do you feel safe going back to the place where you live?  Yes Need for family participation in patient care:  Yes (Comment)  Care giving concerns:  Patient lives in [place] with her granddaughter [name].    Social Worker assessment / plan:  Holiday representative ([place]) reviewed chart and noted that patient recently discharged from [facility]. Per [facility] admissions coordinator at [facility] patient discharged home from the facility on 01/17/18 due to co-pays. Per [facility] patient used 20 days and could not pay the co-pays. CSW met with patient alone at bedside to discuss D/C plan. Patient was alert and oriented X4 and was laying in the bed. CSW introduced self and explained role of CSW department. Patient reported that her granddaughter [name] provides 24/7 care for her. Patient reported that she also has 2 adult sons and 1 adult daughter that provide support. Patient reported that she left [facility] because she could not afford the $165 per day co-pay. Patient reported that she will D/C home from [facility]. RN case manager aware of above. Please reconsult if future social work needs arise. CSW  signing off.   Employment status:  Disabled (Comment on whether or not currently receiving Disability), Retired Nurse, adult PT Recommendations:  Home with [place] / Referral to community resources:     Patient/Family's Response to care:  Patient chose to D/C home.   Patient/Family's Understanding of and Emotional Response to Diagnosis, Current Treatment, and Prognosis:  Patient was very pleasant and thanked CSW for visit.   Emotional Assessment Appearance:  Appears stated age Attitude/Demeanor/Rapport:    Affect (typically observed):  Accepting, Adaptable, Pleasant Orientation:  Oriented to Self, Oriented to Place, Oriented to  Time, Oriented to Situation Alcohol / Substance use:  Not Applicable Psych involvement (Current and /or in the community):  No (Comment)  Discharge Needs  Concerns to be addressed:  Discharge Planning Concerns Readmission within the last 30 days:  No Current discharge risk:  Dependent with Mobility Barriers to Discharge:  Continued Medical Work up   [employer], Veronia Beets, LCSW 01/27/2018, 12:51 PM

## 2018-01-27 NOTE — Discharge Summary (Signed)
[facility] [place] NAME: Debra [name]    MR#:  [id number]  DATE OF BIRTH:  [birth year]  DATE OF ADMISSION:  01/25/2018 ADMITTING PHYSICIAN: [name], MD  DATE OF DISCHARGE: 01/27/2018  PRIMARY CARE PHYSICIAN: Jodi Marble, MD    ADMISSION DIAGNOSIS:  Orthostatic hypotension [I95.1]  DISCHARGE DIAGNOSIS:  orthostatic hypotension need to dehydration positional vertigo acute renal failure secondary to dehydration chronic respiratory failure due to COPD on home oxygen SECONDARY DIAGNOSIS:   Past Medical History:  Diagnosis Date  . Asthma   . CHF (congestive heart failure) ([place])   . COPD (chronic obstructive pulmonary disease) ([place])   . Myocardial infarction [facility])     HOSPITAL COURSE:  DorothyAshbyis a84 y.o.femalewith a known history of CHF- EF 45% in 2017, CAD, Htn, COPD- was in hospital last month for hypotension- discharged after stopping some BP meds, but advised to continue metoprolol. She was sent to rehab, not much improvement- sent home with walker and a wheelchair. For last 2-3 days again feeling dizzi and visiting nurse noted - she is hypotensive so advised to go to ER.  * Hypotension now resolved -decreased BB to 25 mg daily and hold  and lasix -echo looks within normal limits. EF 55 to 60%.  -Patient appeared to be dehydrated from labs. Her creatinine was 1.34.----Received IV fluids. Creatinine is down to 1.0 -pressures much improved  *Postional  vertigo (BPV) -patient has some symptoms of tinnitus. She denies any URI. -Will give trial of meclizine 12.5 mg TID. Since   -MRI brain negative -ENT as out pt  * CAD Cont ASA, Plavix, lipitor  * COPD Cont home inhalers and nebs  * anxiety Cont xanax  * Chronic back pain Cont oxycodone  * Ch respi failure Due to COPD on nasal canula oxygen  * Ch systolic CHF Stable, Stop lasix.  PT recommends HHPT D/w  dter Casondra brown on the phone D/c home   CONSULTS OBTAINED:    DRUG ALLERGIES:   Allergies  Allergen Reactions  . Codeine Itching and Rash    DISCHARGE MEDICATIONS:   Allergies as of 01/27/2018      Reactions   Codeine Itching, Rash      Medication List    STOP taking these medications   acetaminophen 325 MG tablet Commonly known as:  TYLENOL   furosemide 40 MG tablet Commonly known as:  LASIX   predniSONE 10 MG (21) Tbpk tablet Commonly known as:  STERAPRED UNI-PAK 21 TAB     TAKE these medications   albuterol (2.5 MG/3ML) 0.083% nebulizer solution Commonly known as:  PROVENTIL Take 2.5 mg by nebulization 4 (four) times daily as needed for wheezing or shortness of breath.   ALPRAZolam 0.5 MG tablet Commonly known as:  XANAX Take 1 tablet (0.5 mg total) by mouth 2 (two) times daily.   aspirin 81 MG chewable tablet Chew by mouth every morning.   atorvastatin 10 MG tablet Commonly known as:  LIPITOR Take 10 mg by mouth every evening.   celecoxib 200 MG capsule Commonly known as:  CELEBREX Take 200 mg by mouth 2 (two) times daily.   clopidogrel 75 MG tablet Commonly known as:  PLAVIX Take 75 mg by mouth every morning.   docusate sodium 100 MG capsule Commonly known as:  COLACE Take 1 capsule (100 mg total) by mouth daily as needed for mild constipation.   feeding supplement (ENSURE ENLIVE) Liqd Take 237 mLs by mouth  2 (two) times daily between meals.   FLUoxetine 20 MG capsule Commonly known as:  PROZAC Take 1 capsule (20 mg total) by mouth daily.   guaiFENesin 100 MG/5ML Soln Commonly known as:  ROBITUSSIN Take 5 mLs (100 mg total) by mouth every 4 (four) hours as needed for cough or to loosen phlegm.   HYDROcodone-acetaminophen 5-325 MG tablet Commonly known as:  NORCO/VICODIN Take 1 tablet by mouth every 6 (six) hours as needed for moderate pain.   ipratropium-albuterol 0.5-2.5 (3) MG/3ML Soln Commonly known as:  DUONEB Take 3 mLs by  nebulization every 6 (six) hours as needed.   latanoprost 0.005 % ophthalmic solution Commonly known as:  XALATAN Place 1 drop into both eyes at bedtime.   meclizine 12.5 MG tablet Commonly known as:  ANTIVERT Take 1 tablet (12.5 mg total) by mouth 3 (three) times daily as needed for dizziness.   metoprolol tartrate 25 MG tablet Commonly known as:  LOPRESSOR Take 1 tablet (25 mg total) by mouth at bedtime. What changed:  when to take this   OYSTERCAL 500 + D PO Take 1 tablet by mouth 2 (two) times daily.   senna-docusate 8.6-50 MG tablet Commonly known as:  Senokot-S Take 1 tablet by mouth at bedtime as needed for mild constipation.   tiotropium 18 MCG inhalation capsule Commonly known as:  SPIRIVA HANDIHALER Place 1 capsule (18 mcg total) into inhaler and inhale every morning.   tiZANidine 2 MG tablet Commonly known as:  ZANAFLEX Take 1 tablet (2 mg total) by mouth 3 (three) times daily as needed for muscle spasms.       If you experience worsening of your admission symptoms, develop shortness of breath, life threatening emergency, suicidal or homicidal thoughts you must seek medical attention immediately by calling 911 or calling your MD immediately  if symptoms less severe.  You Must read complete instructions/literature along with all the possible adverse reactions/side effects for all the Medicines you take and that have been prescribed to you. Take any new Medicines after you have completely understood and accept all the possible adverse reactions/side effects.   Please note  You were cared for by a hospitalist during your hospital stay. If you have any questions about your discharge medications or the care you received while you were in the hospital after you are discharged, you can call the unit and asked to speak with the hospitalist on call if the hospitalist that took care of you is not available. Once you are discharged, your primary care physician will handle any  further medical issues. Please note that NO REFILLS for any discharge medications will be authorized once you are discharged, as it is imperative that you return to your primary care physician (or establish a relationship with a primary care physician if you do not have one) for your aftercare needs so that they can reassess your need for medications and monitor your lab values. Today   SUBJECTIVE   Some positional dizziness. No vomiting  VITAL SIGNS:  Blood pressure (!) 151/59, pulse 63, temperature 98.2 F (36.8 C), temperature source Oral, resp. rate 18, height 5\' 1"  (1.549 m), weight 74.4 kg (164 lb), SpO2 98 %.  I/O:    Intake/Output Summary (Last 24 hours) at 01/27/2018 1406 Last data filed at 01/27/2018 1132 Gross per 24 hour  Intake 1781.67 ml  Output 1200 ml  Net 581.67 ml    PHYSICAL EXAMINATION:  GENERAL:  84 y.o.-year-old patient lying in the bed with no  acute distress.  EYES: Pupils equal, round, reactive to light and accommodation. No scleral icterus. Extraocular muscles intact.  HEENT: Head atraumatic, normocephalic. Oropharynx and nasopharynx clear.  NECK:  Supple, no jugular venous distention. No thyroid enlargement, no tenderness.  LUNGS: Normal breath sounds bilaterally, no wheezing, rales,rhonchi or crepitation. No use of accessory muscles of respiration.  CARDIOVASCULAR: S1, S2 normal. No murmurs, rubs, or gallops.  ABDOMEN: Soft, non-tender, non-distended. Bowel sounds present. No organomegaly or mass.  EXTREMITIES: No pedal edema, cyanosis, or clubbing.  NEUROLOGIC: Cranial nerves II through XII are intact. Muscle strength 5/5 in all extremities. Sensation intact. Gait not checked.  PSYCHIATRIC: The patient is alert and oriented x 3.  SKIN: No obvious rash, lesion, or ulcer.   DATA REVIEW:   CBC  Recent Labs  Lab 01/26/18 0427  WBC 8.6  HGB 11.6*  HCT 33.9*  PLT 276    Chemistries  Recent Labs  Lab 01/26/18 0427  NA 138  K 4.9  CL 100*  CO2  33*  GLUCOSE 97  BUN 29*  CREATININE 1.09*  CALCIUM 8.4*    Microbiology Results   No results found for this or any previous visit (from the past 240 hour(s)).  RADIOLOGY:  Mr Brain Wo Contrast  Result Date: 01/27/2018 CLINICAL DATA:  Vertigo, episodic, peripheral EXAM: MRI HEAD WITHOUT CONTRAST TECHNIQUE: Multiplanar, multiecho pulse sequences of the brain and surrounding structures were obtained without intravenous contrast. COMPARISON:  02/23/2011 FINDINGS: Brain: No acute infarction, hemorrhage, hydrocephalus, extra-axial collection or mass effect. 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 Mild for age cerebral volume loss. Mild chronic small vessel ischemia in the cerebral white matter. No findings along the labyrinth, CP angle cisterns, or brainstem to explain vertigo. Vascular: Major flow voids are preserved, including vertebrobasilar Skull and upper cervical spine: Negative for marrow lesion. Sinuses/Orbits: Chronic right mastoid opacification with mild increase from 2012. Negative nasopharynx. IMPRESSION: 1. No acute intracranial finding. 2. Chronic right mastoid opacification with mild increased since 2012. 3. Incidental pericallosal lipoma. Electronically Signed   By: Monte Fantasia M.D.   On: 01/27/2018 13:29     Management plans discussed with the patient, family and they are in agreement.  CODE STATUS:     Code Status Orders  (From admission, onward)        Start     Ordered   01/25/18 1811  Do not attempt resuscitation (DNR)  Continuous    Question Answer Comment  In the event of cardiac or respiratory ARREST Do not call a "code blue"   In the event of cardiac or respiratory ARREST Do not perform Intubation, CPR, defibrillation or ACLS   In the event of cardiac or respiratory ARREST Use medication by any route, position, wound care, and other measures to relive pain and suffering. May use oxygen, suction and manual treatment of airway obstruction as  needed for comfort.      01/25/18 1810    Code Status History    Date Active Date Inactive Code Status Order ID Comments User Context   12/22/2017 1631 12/28/2017 2218 DNR [id number]  Demetrios Loll, MD Inpatient   12/05/2016 1102 12/08/2016 1724 DNR [id number]  Hillary Bow, MD ED   10/05/2016 2302 10/10/2016 2354 Full Code [id number]  Lance Coon, MD Inpatient   06/10/2016 1032 06/10/2016 1050 Full Code [id number]  Theodoro Grist, MD Inpatient   09/14/2015 2019 09/16/2015 2034 Full Code [id number]  Theodoro Grist, MD Inpatient   08/27/2015 0805 09/01/2015 2042 Full Code  [id number]  Harrie Foreman, MD Inpatient      TOTAL TIME TAKING CARE OF THIS PATIENT: *40 minutes.    Fritzi Mandes M.D on 01/27/2018 at 2:06 PM  Between 7am to 6pm - Pager - [phone] After 6pm go to [email or web address] - password [email or web address]  [facility]  Office  [phone]  CC: Primary care physician; Jodi Marble, MD

## 2018-01-27 NOTE — Evaluation (Signed)
Physical Therapy Evaluation Patient Details Name: Debra [name] MRN: [id number] DOB: 01-13-1933 Today's Date: 01/27/2018   History of Present Illness  Patient is an 84 year old female admitted from home with orthostatic hypotension.  PMH includes MI, COPD, CHF and asthma.  Clinical Impression  Patient is an 84 year old female who lives in a one story home with her granddaughter.  She has recently DC'd from SNF and has been using a WC for most of her mobility.  Pt in bed and reports no pain or HA, but dizziness, upon PT arrival.  She is able to perform bed mobility mod I and STS with supervision. Pt presents with mildly decreased strength of UE and WNL strength of LE.  She is able to manage RW and walk 5 ft with CGA but reports abrupt increase in back pain, asking to sit in her recliner. Pt is able to perform STS transfer safely and demonstrate knowledge of use of RW.  Pt able to perform sitting and standing balance with BUE's and no assistance from PT.  No formal balance testing performed due to pt reported severe dizziness throughout evaluation.  Pt will benefit from skilled PT with focus on strength, tolerance to activity, pain management and functional mobility.    Follow Up Recommendations Home health PT    Equipment Recommendations  None recommended by PT    Recommendations for Other Services       Precautions / Restrictions Precautions Precautions: Fall Restrictions Weight Bearing Restrictions: No      Mobility  Bed Mobility Overal bed mobility: Modified Independent             General bed mobility comments: Increased time and use of bedrail.  Transfers Overall transfer level: Needs assistance Equipment used: Rolling walker (2 wheeled) Transfers: Sit to/from Stand Sit to Stand: Supervision         General transfer comment: Good management of RW, able to stand without physical assist.  Supervision for fall prevention due to pt reporting still being  dizzy.  Ambulation/Gait Ambulation/Gait assistance: Min guard Ambulation Distance (Feet): 5 Feet Assistive device: Rolling walker (2 wheeled)     Gait velocity interpretation: <1.8 ft/sec, indicate of risk for recurrent falls General Gait Details: Low foot clearance, decreased hip and knee flexion during swing phase, flexed posture, good management of RW.  Pt reports severe increase in back pain at 5 ft and asks to sit in chair.  Able to back to chair and sit with controlled descent.  Stairs            Wheelchair Mobility    Modified Rankin (Stroke Patients Only)       Balance Overall balance assessment: Modified Independent                                           Pertinent Vitals/Pain Pain Assessment: No/denies pain(Reports no HA)    Home Living Family/patient expects to be discharged to:: Private residence Living Arrangements: Children(Granddaughter) Available Help at Discharge: Family;Available 24 hours/day Type of Home: House Home Access: Ramped entrance     Home Layout: One level Home Equipment: Bedside commode;Wheelchair - [employer] - 2 wheels      Prior Function Level of Independence: Needs assistance   Gait / Transfers Assistance Needed: Has a RW but has been using WC for mobility.  ADL's / Homemaking Assistance Needed: Receives assistance with  ADL's such as bathing and dressing.        Hand Dominance        Extremity/Trunk Assessment   Upper Extremity Assessment Upper Extremity Assessment: Overall WFL for tasks assessed(Grossly 4-/5 bilaterally.  Mildly limited shoulder abduction with pain at 150 degrees.)    Lower Extremity Assessment Lower Extremity Assessment: Overall WFL for tasks assessed(Grossly 4/5 bilaterally.)    Cervical / Trunk Assessment Cervical / Trunk Assessment: Normal  Communication   Communication: No difficulties  Cognition Arousal/Alertness: Awake/alert Behavior During Therapy: WFL for tasks  assessed/performed Overall Cognitive Status: Within Functional Limits for tasks assessed                                 General Comments: Pt reports dizziness and visual deficit throughout evaluation.  A&O x4 and able to follow commands consistently.      General Comments      Exercises     Assessment/Plan    PT Assessment Patient needs continued PT services  PT Problem List Decreased strength;Decreased mobility;Decreased activity tolerance       PT Treatment Interventions DME instruction;Therapeutic activities;Gait training;Therapeutic exercise;Patient/family education;Stair training;Balance training;Functional mobility training;Neuromuscular re-education    PT Goals (Current goals can be found in the Care Plan section)  Acute Rehab PT Goals Patient Stated Goal: To return home and continue to work with PT. PT Goal Formulation: With patient Time For Goal Achievement: 02/10/18 Potential to Achieve Goals: Good    Frequency Min 2X/week   Barriers to discharge        Co-evaluation               AM-PAC PT "6 Clicks" Daily Activity  Outcome Measure Difficulty turning over in bed (including adjusting bedclothes, sheets and blankets)?: A Little Difficulty moving from lying on back to sitting on the side of the bed? : A Little Difficulty sitting down on and standing up from a chair with arms (e.g., wheelchair, bedside commode, etc,.)?: A Little Help needed moving to and from a bed to chair (including a wheelchair)?: A Little Help needed walking in hospital room?: A Lot Help needed climbing 3-5 steps with a railing? : A Lot 6 Click Score: 16    End of Session Equipment Utilized During Treatment: Gait belt;Oxygen Activity Tolerance: Patient limited by pain;Treatment limited secondary to medical complications (Comment)(Dizziness reported throughout evaluation.) Patient left: in chair;with call bell/phone within reach;with chair alarm set Nurse  Communication: Mobility status PT Visit Diagnosis: Unsteadiness on feet (R26.81);Pain Pain - part of body: (Low back pain)    Time: 1000-1025 PT Time Calculation (min) (ACUTE ONLY): 25 min   Charges:   PT Evaluation $PT Eval Low Complexity: 1 Low     PT G Codes:   PT G-Codes **NOT FOR INPATIENT CLASS** Functional Assessment Tool Used: AM-PAC 6 Clicks Basic Mobility    Roxanne Gates, PT, DPT   Roxanne Gates 01/27/2018, 10:46 AM

## 2018-01-27 NOTE — Progress Notes (Signed)
[facility] [place] NAME: Debra [name]    MR#:  [id number]  DATE OF BIRTH:  [birth year]  SUBJECTIVE:   Came in after having dizziness on and off with a fall last week. c/o of some noise in her ears. Denies any respiratory tract infection. Patient tells me the room started spinning when she is trying to get up. She continues to be dizzy with change in position REVIEW OF SYSTEMS:   Review of Systems  Constitutional: Negative for chills, fever and weight loss.  HENT: Positive for tinnitus. Negative for ear discharge, ear pain and nosebleeds.   Eyes: Negative for blurred vision, pain and discharge.  Respiratory: Negative for sputum production, shortness of breath, wheezing and stridor.   Cardiovascular: Negative for chest pain, palpitations, orthopnea and PND.  Gastrointestinal: Negative for abdominal pain, diarrhea, nausea and vomiting.  Genitourinary: Negative for frequency and urgency.  Musculoskeletal: Negative for back pain and joint pain.  Neurological: Positive for dizziness. Negative for sensory change, speech change, focal weakness and weakness.  Psychiatric/Behavioral: Negative for depression and hallucinations. The patient is not nervous/anxious.    Tolerating Diet:yesTolerating PT: pending  DRUG ALLERGIES:   Allergies  Allergen Reactions  . Codeine Itching and Rash    VITALS:  Blood pressure (!) 151/59, pulse 63, temperature 98.2 F (36.8 C), temperature source Oral, resp. rate 18, height 5\' 1"  (1.549 m), weight 74.4 kg (164 lb), SpO2 98 %.  PHYSICAL EXAMINATION:   Physical Exam  GENERAL:  84 y.o.-year-old patient lying in the bed with no acute distress.  EYES: Pupils equal, round, reactive to light and accommodation. No scleral icterus. Extraocular muscles intact.  HEENT: Head atraumatic, normocephalic. Oropharynx and nasopharynx clear.  NECK:  Supple, no jugular venous distention. No thyroid enlargement, no  tenderness.  LUNGS: Normal breath sounds bilaterally, no wheezing, rales, rhonchi. No use of accessory muscles of respiration.  CARDIOVASCULAR: S1, S2 normal. No murmurs, rubs, or gallops.  ABDOMEN: Soft, nontender, nondistended. Bowel sounds present. No organomegaly or mass.  EXTREMITIES: No cyanosis, clubbing or edema b/l.    NEUROLOGIC: Cranial nerves II through XII are intact. No focal Motor or sensory deficits b/l.   PSYCHIATRIC:  patient is alert and oriented x 3.  SKIN: No obvious rash, lesion, or ulcer.   LABORATORY PANEL:  CBC Recent Labs  Lab 01/26/18 0427  WBC 8.6  HGB 11.6*  HCT 33.9*  PLT 276    Chemistries  Recent Labs  Lab 01/26/18 0427  NA 138  K 4.9  CL 100*  CO2 33*  GLUCOSE 97  BUN 29*  CREATININE 1.09*  CALCIUM 8.4*   Cardiac Enzymes No results for input(s): TROPONINI in the last 168 hours. RADIOLOGY:  Dg Chest 2 View  Result Date: 01/25/2018 CLINICAL DATA:  Decreased O2 sats, hypotension, weakness. EXAM: CHEST - 2 VIEW COMPARISON:  12/22/2017, 10/22/2017 and 12/07/2016. FINDINGS: Trachea is midline. Heart is enlarged, stable. Thoracic aorta is calcified. Mild prominence of the pulmonary markings appears chronic. No airspace consolidation or pleural fluid. Vertebral body augmentations. Lower thoracic or upper lumbar mild compression deformities, as on 10/22/2017. IMPRESSION: 1. No acute findings. 2.  Aortic atherosclerosis (ICD10-170.0). Electronically Signed   By: Lorin Picket M.D.   On: 01/25/2018 11:54   ASSESSMENT AND PLAN:  Debra [name]  is a 84 y.o. female with a known history of CHF- EF 45% in 2017, CAD, Htn, COPD- was in hospital last month for hypotension- discharged after stopping some  BP meds, but advised to continue metoprolol. She was sent to rehab, not much improvement- sent home with walker and a wheelchair. For last 2-3 days again feeling dizzi and visiting nurse noted - she is hypotensive so advised to go to ER.  * Hypotension now  resolved   Hold metoprolol and lasix, she may not need it on discharge  -echo looks within normal limits. EF 55 to 60%.  -Patient appeared to be dehydrated from labs. Her creatinine was 1.34.----Received IV fluids. Creatinine is down to 1.0 -pressures much improved  *Suspected vertigo (BPV) -patient has some symptoms of tinnitus. She denies any URI. -Will give trial of meclizine 12.5 mg TID. Since  Not much  improvement will do MRI of the brain. If that is negative then ENT consult as out pt  * CAD   Cont ASA, Plavix, lipitor   No betablocker due to hypotension  * COPD   Cont home inhalers and nebs  * anxiety   Cont xanax  * Chronic back pain   Cont oxycodone  * Ch respi failure   Due to COPD on nasal canula oxygen  * Ch systolic CHF   Stable, Stop lasix.  this was discussed with patient and patient's son in the room   Case discussed with Care Management/Social Worker. Management plans discussed with the patient, family and they are in agreement.  CODE STATUS: full  DVT Prophylaxis: lovenox  TOTAL TIME TAKING CARE OF THIS PATIENT: *30* minutes.  >50% time spent on counselling and coordination of care  POSSIBLE D/C IN *1-2 DAYS, DEPENDING ON CLINICAL CONDITION.  Note: This dictation was prepared with Dragon dictation along with smaller phrase technology. Any transcriptional errors that result from this process are unintentional.  Fritzi Mandes M.D on 01/27/2018 at 7:50 AM  Between 7am to 6pm - Pager - [phone]  After 6pm go to [email or web address] - password [email or web address] [facility]  Office  [phone]  CC: Primary care physician; Jodi Marble, MDPatient ID: Debra [name], female   DOB: [birth year], 84 y.o.   MRN: [id number]

## 2018-01-27 NOTE — Progress Notes (Signed)
PT Cancellation Note  Patient Details Name: Debra [name] MRN: [id number] DOB: [birth year]   Cancelled Treatment:    Reason Eval/Treat Not Completed: Patient at procedure or test/unavailable.  Order received.  Chart reviewed.  Pt with NA.  Will re-attempt at at later time.   Roxanne Gates, PT, DPT 01/27/2018, 10:02 AM

## 2018-02-27 ENCOUNTER — Encounter: Payer: Self-pay | Admitting: Internal Medicine

## 2018-06-23 ENCOUNTER — Emergency Department: Payer: Medicare Other

## 2018-06-23 ENCOUNTER — Other Ambulatory Visit: Payer: Self-pay

## 2018-06-23 ENCOUNTER — Observation Stay
Admission: EM | Admit: 2018-06-23 | Discharge: 2018-06-25 | Disposition: A | Discharge status: Skilled Nursing Facility | Payer: Medicare Other | Attending: Internal Medicine | Admitting: Internal Medicine

## 2018-06-23 DIAGNOSIS — S300XXA Contusion of lower back and pelvis, initial encounter: Secondary | ICD-10-CM | POA: Insufficient documentation

## 2018-06-23 DIAGNOSIS — S06340A Traumatic hemorrhage of right cerebrum without loss of consciousness, initial encounter: Secondary | ICD-10-CM | POA: Diagnosis not present

## 2018-06-23 DIAGNOSIS — J449 Chronic obstructive pulmonary disease, unspecified: Secondary | ICD-10-CM | POA: Diagnosis not present

## 2018-06-23 DIAGNOSIS — F4322 Adjustment disorder with anxiety: Secondary | ICD-10-CM | POA: Insufficient documentation

## 2018-06-23 DIAGNOSIS — S329XXA Fracture of unspecified parts of lumbosacral spine and pelvis, initial encounter for closed fracture: Secondary | ICD-10-CM

## 2018-06-23 DIAGNOSIS — Z9981 Dependence on supplemental oxygen: Secondary | ICD-10-CM | POA: Insufficient documentation

## 2018-06-23 DIAGNOSIS — J9610 Chronic respiratory failure, unspecified whether with hypoxia or hypercapnia: Secondary | ICD-10-CM | POA: Insufficient documentation

## 2018-06-23 DIAGNOSIS — Z951 Presence of aortocoronary bypass graft: Secondary | ICD-10-CM | POA: Insufficient documentation

## 2018-06-23 DIAGNOSIS — Z8249 Family history of ischemic heart disease and other diseases of the circulatory system: Secondary | ICD-10-CM | POA: Diagnosis not present

## 2018-06-23 DIAGNOSIS — Z79899 Other long term (current) drug therapy: Secondary | ICD-10-CM | POA: Diagnosis not present

## 2018-06-23 DIAGNOSIS — Z7982 Long term (current) use of aspirin: Secondary | ICD-10-CM | POA: Insufficient documentation

## 2018-06-23 DIAGNOSIS — Z66 Do not resuscitate: Secondary | ICD-10-CM | POA: Insufficient documentation

## 2018-06-23 DIAGNOSIS — S32502A Unspecified fracture of left pubis, initial encounter for closed fracture: Secondary | ICD-10-CM | POA: Insufficient documentation

## 2018-06-23 DIAGNOSIS — M25552 Pain in left hip: Secondary | ICD-10-CM

## 2018-06-23 DIAGNOSIS — Z23 Encounter for immunization: Secondary | ICD-10-CM | POA: Diagnosis not present

## 2018-06-23 DIAGNOSIS — I252 Old myocardial infarction: Secondary | ICD-10-CM | POA: Insufficient documentation

## 2018-06-23 DIAGNOSIS — Z87891 Personal history of nicotine dependence: Secondary | ICD-10-CM | POA: Diagnosis not present

## 2018-06-23 DIAGNOSIS — I2510 Atherosclerotic heart disease of native coronary artery without angina pectoris: Secondary | ICD-10-CM | POA: Diagnosis not present

## 2018-06-23 DIAGNOSIS — R42 Dizziness and giddiness: Secondary | ICD-10-CM

## 2018-06-23 DIAGNOSIS — Z7902 Long term (current) use of antithrombotics/antiplatelets: Secondary | ICD-10-CM | POA: Diagnosis not present

## 2018-06-23 DIAGNOSIS — W19XXXA Unspecified fall, initial encounter: Secondary | ICD-10-CM | POA: Diagnosis not present

## 2018-06-23 DIAGNOSIS — I629 Nontraumatic intracranial hemorrhage, unspecified: Secondary | ICD-10-CM

## 2018-06-23 LAB — CBC WITH DIFFERENTIAL/PLATELET
ABS IMMATURE GRANULOCYTES: 0.07 10*3/uL (ref 0.00–0.07)
BASOS ABS: 0 10*3/uL (ref 0.0–0.1)
BASOS PCT: 0 %
EOS PCT: 8 %
Eosinophils Absolute: 0.7 10*3/uL — ABNORMAL HIGH (ref 0.0–0.5)
HCT: 33.3 % — ABNORMAL LOW (ref 36.0–46.0)
HEMOGLOBIN: 10.5 g/dL — AB (ref 12.0–15.0)
Immature Granulocytes: 1 %
LYMPHS PCT: 20 %
Lymphs Abs: 1.7 10*3/uL (ref 0.7–4.0)
MCH: 32.9 pg (ref 26.0–34.0)
MCHC: 31.5 g/dL (ref 30.0–36.0)
MCV: 104.4 fL — ABNORMAL HIGH (ref 80.0–100.0)
Monocytes Absolute: 0.5 10*3/uL (ref 0.1–1.0)
Monocytes Relative: 6 %
NEUTROS ABS: 5.4 10*3/uL (ref 1.7–7.7)
NRBC: 0 % (ref 0.0–0.2)
Neutrophils Relative %: 65 %
PLATELETS: 218 10*3/uL (ref 150–400)
RBC: 3.19 MIL/uL — AB (ref 3.87–5.11)
RDW: 13.5 % (ref 11.5–15.5)
WBC: 8.4 10*3/uL (ref 4.0–10.5)

## 2018-06-23 LAB — URINALYSIS, COMPLETE (UACMP) WITH MICROSCOPIC
BILIRUBIN URINE: NEGATIVE
Bacteria, UA: NONE SEEN
GLUCOSE, UA: 50 mg/dL — AB
HGB URINE DIPSTICK: NEGATIVE
Ketones, ur: NEGATIVE mg/dL
Leukocytes, UA: NEGATIVE
NITRITE: NEGATIVE
Protein, ur: NEGATIVE mg/dL
SPECIFIC GRAVITY, URINE: 1.015 (ref 1.005–1.030)
pH: 6 (ref 5.0–8.0)

## 2018-06-23 LAB — BASIC METABOLIC PANEL
ANION GAP: 9 (ref 5–15)
BUN: 15 mg/dL (ref 8–23)
CHLORIDE: 100 mmol/L (ref 98–111)
CO2: 30 mmol/L (ref 22–32)
Calcium: 8.7 mg/dL — ABNORMAL LOW (ref 8.9–10.3)
Creatinine, Ser: 1.16 mg/dL — ABNORMAL HIGH (ref 0.44–1.00)
GFR calc non Af Amer: 42 mL/min — ABNORMAL LOW (ref >60)
GFR, EST AFRICAN AMERICAN: 49 mL/min — AB (ref >60)
Glucose, Bld: 142 mg/dL — ABNORMAL HIGH (ref 70–99)
Potassium: 3.6 mmol/L (ref 3.5–5.1)
SODIUM: 139 mmol/L (ref 135–145)

## 2018-06-23 LAB — TROPONIN I
Troponin I: <0.03 ng/mL (ref <0.03)
Troponin I: <0.03 ng/mL (ref <0.03)

## 2018-06-23 MED ORDER — MECLIZINE HCL 25 MG PO TABS
25.0000 mg | ORAL_TABLET | Freq: Once | ORAL | Status: AC
  Administered 2018-06-23: 25 mg via ORAL
  Filled 2018-06-23: qty 1

## 2018-06-23 MED ORDER — DOCUSATE SODIUM 100 MG PO CAPS
100.0000 mg | ORAL_CAPSULE | Freq: Two times a day (BID) | ORAL | Status: DC | PRN
  Administered 2018-06-25: 100 mg via ORAL
  Filled 2018-06-23: qty 1

## 2018-06-23 MED ORDER — TIOTROPIUM BROMIDE MONOHYDRATE 18 MCG IN CAPS
18.0000 ug | ORAL_CAPSULE | Freq: Every morning | RESPIRATORY_TRACT | Status: DC
  Administered 2018-06-23 – 2018-06-25 (×3): 18 ug via RESPIRATORY_TRACT
  Filled 2018-06-23: qty 5

## 2018-06-23 MED ORDER — PANTOPRAZOLE SODIUM 40 MG PO TBEC
40.0000 mg | DELAYED_RELEASE_TABLET | Freq: Every day | ORAL | Status: DC
  Administered 2018-06-24 – 2018-06-25 (×2): 40 mg via ORAL
  Filled 2018-06-23 (×2): qty 1

## 2018-06-23 MED ORDER — ALPRAZOLAM 0.5 MG PO TABS
0.5000 mg | ORAL_TABLET | Freq: Two times a day (BID) | ORAL | Status: DC
  Administered 2018-06-23 – 2018-06-25 (×3): 0.5 mg via ORAL
  Filled 2018-06-23 (×3): qty 1

## 2018-06-23 MED ORDER — ENSURE ENLIVE PO LIQD
237.0000 mL | Freq: Two times a day (BID) | ORAL | Status: DC
  Administered 2018-06-24 – 2018-06-25 (×4): 237 mL via ORAL

## 2018-06-23 MED ORDER — GUAIFENESIN 100 MG/5ML PO SOLN
5.0000 mL | ORAL | Status: DC | PRN
  Administered 2018-06-23 – 2018-06-25 (×2): 100 mg via ORAL
  Filled 2018-06-23 (×3): qty 5

## 2018-06-23 MED ORDER — INFLUENZA VAC SPLIT HIGH-DOSE 0.5 ML IM SUSY
0.5000 mL | PREFILLED_SYRINGE | INTRAMUSCULAR | Status: AC
  Administered 2018-06-25: 0.5 mL via INTRAMUSCULAR
  Filled 2018-06-23: qty 0.5

## 2018-06-23 MED ORDER — ALBUTEROL SULFATE (2.5 MG/3ML) 0.083% IN NEBU: 2.5000 mg | INHALATION_SOLUTION | Freq: Four times a day (QID) | RESPIRATORY_TRACT | Status: DC | PRN

## 2018-06-23 MED ORDER — ATORVASTATIN CALCIUM 10 MG PO TABS
10.0000 mg | ORAL_TABLET | Freq: Every evening | ORAL | Status: DC
  Administered 2018-06-23 – 2018-06-24 (×2): 10 mg via ORAL
  Filled 2018-06-23 (×2): qty 1

## 2018-06-23 MED ORDER — CALCIUM CARBONATE-VITAMIN D 500-200 MG-UNIT PO TABS
1.0000 | ORAL_TABLET | Freq: Two times a day (BID) | ORAL | Status: DC
  Administered 2018-06-23 – 2018-06-25 (×4): 1 via ORAL
  Filled 2018-06-23 (×4): qty 1

## 2018-06-23 MED ORDER — FLUOXETINE HCL 20 MG PO CAPS
20.0000 mg | ORAL_CAPSULE | Freq: Every day | ORAL | Status: DC
  Administered 2018-06-24 – 2018-06-25 (×2): 20 mg via ORAL
  Filled 2018-06-23 (×2): qty 1

## 2018-06-23 MED ORDER — TRAZODONE HCL 50 MG PO TABS: 50.0000 mg | ORAL_TABLET | Freq: Every evening | ORAL | Status: DC | PRN

## 2018-06-23 MED ORDER — MECLIZINE HCL 12.5 MG PO TABS
12.5000 mg | ORAL_TABLET | Freq: Three times a day (TID) | ORAL | Status: DC | PRN
  Filled 2018-06-23: qty 1

## 2018-06-23 MED ORDER — METOPROLOL TARTRATE 25 MG PO TABS
25.0000 mg | ORAL_TABLET | Freq: Every day | ORAL | Status: DC
  Administered 2018-06-23: 25 mg via ORAL
  Filled 2018-06-23 (×2): qty 1

## 2018-06-23 MED ORDER — DOCUSATE SODIUM 100 MG PO CAPS: 100.0000 mg | ORAL_CAPSULE | Freq: Every day | ORAL | Status: DC | PRN

## 2018-06-23 MED ORDER — AZELASTINE HCL 0.1 % NA SOLN
1.0000 | Freq: Every day | NASAL | Status: DC
  Administered 2018-06-23 – 2018-06-25 (×3): 1 via NASAL
  Filled 2018-06-23: qty 30

## 2018-06-23 MED ORDER — HYDROCODONE-ACETAMINOPHEN 5-325 MG PO TABS
1.0000 | ORAL_TABLET | Freq: Four times a day (QID) | ORAL | Status: DC | PRN
  Administered 2018-06-24 – 2018-06-25 (×4): 1 via ORAL
  Filled 2018-06-23 (×4): qty 1

## 2018-06-23 MED ORDER — TIZANIDINE HCL 2 MG PO TABS
2.0000 mg | ORAL_TABLET | Freq: Three times a day (TID) | ORAL | Status: DC | PRN
  Administered 2018-06-23: 2 mg via ORAL
  Filled 2018-06-23 (×2): qty 1

## 2018-06-23 MED ORDER — SODIUM CHLORIDE 0.9 % IV BOLUS
1000.0000 mL | Freq: Once | INTRAVENOUS | Status: AC
  Administered 2018-06-23: 1000 mL via INTRAVENOUS

## 2018-06-23 MED ORDER — ONDANSETRON HCL 4 MG/2ML IJ SOLN
4.0000 mg | Freq: Once | INTRAMUSCULAR | Status: AC
  Administered 2018-06-23: 4 mg via INTRAVENOUS
  Filled 2018-06-23: qty 2

## 2018-06-23 MED ORDER — IPRATROPIUM-ALBUTEROL 0.5-2.5 (3) MG/3ML IN SOLN: 3.0000 mL | Freq: Four times a day (QID) | RESPIRATORY_TRACT | Status: DC | PRN

## 2018-06-23 MED ORDER — LATANOPROST 0.005 % OP SOLN
1.0000 [drp] | Freq: Every day | OPHTHALMIC | Status: DC
  Administered 2018-06-23 – 2018-06-24 (×2): 1 [drp] via OPHTHALMIC
  Filled 2018-06-23: qty 2.5

## 2018-06-23 MED ORDER — SENNOSIDES-DOCUSATE SODIUM 8.6-50 MG PO TABS: 1.0000 | ORAL_TABLET | Freq: Every evening | ORAL | Status: DC | PRN

## 2018-06-23 NOTE — ED Notes (Signed)
Pt c/o dizziness that is worse with position change - pt also states that when she fell she hit her head on the floor - pt is A&O x3

## 2018-06-23 NOTE — ED Provider Notes (Addendum)
[facility] Emergency Department Provider Note ____________________________________________   I have reviewed the triage vital signs and the triage nursing note.  HISTORY  Chief Complaint Fall; Hip Pain; and Dizziness   Historian Patient  HPI Debra [name] is a 84 y.o. female reports fall from standing, states that she felt a little bit dizzy and then sort of tripped.  Sound like mostly mechanical fall.  No recent illnesses.  No coughing.  No chest pain.  No focal weakness or numbness.  States that she did strike her head and it bounced like a basketball, but no specific headache right now.  No neck pain or posterior C-spine pain.  She is having pain at her  left hip at rest and with motion.    Past Medical History:  Diagnosis Date  . Asthma   . CHF (congestive heart failure) ([place])   . COPD (chronic obstructive pulmonary disease) ([place])   . Myocardial infarction [facility])     Patient Active Problem List   Diagnosis Date Noted  . Pelvic fracture ([place]) 06/23/2018  . Dizziness 01/25/2018  . Moderate recurrent major depression ([place]) 12/25/2017  . Acute adjustment disorder with anxiety 12/25/2017  . Somatic symptom disorder 12/25/2017  . Acute respiratory failure with hypoxia ([place]) 12/22/2017  . CHF (congestive heart failure) ([place]) 12/05/2016  . CAP (community acquired pneumonia) 10/05/2016  . Chronic systolic heart failure ([place]) 06/28/2016  . COPD (chronic obstructive pulmonary disease) with emphysema ([place]) 06/28/2016  . Tobacco use 06/28/2016  . Acidosis 09/14/2015  . Hypotension 09/14/2015  . Hypokalemia 09/14/2015  . COPD exacerbation ([place]) 09/01/2015  . Sepsis ([place]) 08/27/2015    Past Surgical History:  Procedure Laterality Date  . ABDOMINAL HYSTERECTOMY    . BREAST SURGERY    . BYPASS GRAFT  2007   triple  . CHOLECYSTECTOMY    . CORONARY ARTERY BYPASS GRAFT    . TONSILLECTOMY      Prior to Admission medications   Medication Sig  Start Date End Date Taking? Authorizing Provider  albuterol (PROVENTIL HFA;VENTOLIN HFA) 108 (90 Base) MCG/ACT inhaler Inhale 1-2 puffs into the lungs every 6 (six) hours as needed for wheezing or shortness of breath.   Yes [provider]  albuterol (PROVENTIL) (2.5 MG/3ML) 0.083% nebulizer solution Take 2.5 mg by nebulization 4 (four) times daily as needed for wheezing or shortness of breath.   Yes [provider]  ALPRAZolam (XANAX) 0.5 MG tablet Take 1 tablet (0.5 mg total) by mouth 2 (two) times daily. 12/28/17  Yes Gouru, Illene Silver, MD  aspirin 81 MG chewable tablet Chew by mouth every morning.   Yes [provider]  atorvastatin (LIPITOR) 10 MG tablet Take 10 mg by mouth every evening. 05/16/16  Yes [provider]  azelastine (ASTELIN) 0.1 % nasal spray Place 1 spray into both nostrils daily. 05/30/18  Yes [provider]  clopidogrel (PLAVIX) 75 MG tablet Take 75 mg by mouth every morning.    Yes [provider]  diclofenac sodium (VOLTAREN) 1 % GEL Apply 1 g topically 2 (two) times daily. Apply to knees 06/02/18  Yes [provider]  docusate sodium (COLACE) 100 MG capsule Take 1 capsule (100 mg total) by mouth daily as needed for mild constipation. 12/28/17  Yes Gouru, Illene Silver, MD  feeding supplement, ENSURE ENLIVE, (ENSURE ENLIVE) LIQD Take 237 mLs by mouth 2 (two) times daily between meals. 12/08/16  Yes Mody, Ulice Bold, MD  FLUoxetine (PROZAC) 20 MG capsule Take 1 capsule (20  mg total) by mouth daily. 09/01/15  Yes Aldean Jewett, MD  guaiFENesin (ROBITUSSIN) 100 MG/5ML SOLN Take 5 mLs (100 mg total) by mouth every 4 (four) hours as needed for cough or to loosen phlegm. 12/28/17  Yes Gouru, Illene Silver, MD  HYDROcodone-acetaminophen (NORCO/VICODIN) 5-325 MG tablet Take 1 tablet by mouth every 6 (six) hours as needed for moderate pain. 12/28/17  Yes Gouru, Aruna, MD  ipratropium-albuterol (DUONEB) 0.5-2.5 (3) MG/3ML SOLN Take 3 mLs by nebulization  every 6 (six) hours as needed. 12/28/17  Yes Gouru, Illene Silver, MD  meclizine (ANTIVERT) 12.5 MG tablet Take 1 tablet (12.5 mg total) by mouth 3 (three) times daily as needed for dizziness. 01/27/18  Yes Fritzi Mandes, MD  metoprolol tartrate (LOPRESSOR) 25 MG tablet Take 1 tablet (25 mg total) by mouth at bedtime. 01/27/18  Yes Fritzi Mandes, MD  OS-CAL CALCIUM + D3 500-200 MG-UNIT TABS Take 1 tablet by mouth 2 (two) times daily. 05/05/18  Yes [provider]  pantoprazole (PROTONIX) 40 MG tablet Take 40 mg by mouth daily.   Yes [provider]  senna-docusate (SENOKOT-S) 8.6-50 MG tablet Take 1 tablet by mouth at bedtime as needed for mild constipation. 12/28/17  Yes Gouru, Illene Silver, MD  tiotropium (SPIRIVA HANDIHALER) 18 MCG inhalation capsule Place 1 capsule (18 mcg total) into inhaler and inhale every morning. 09/01/15  Yes Aldean Jewett, MD  traZODone (DESYREL) 50 MG tablet Take 50-100 mg by mouth at bedtime as needed for sleep. 06/16/18  Yes [provider]  latanoprost (XALATAN) 0.005 % ophthalmic solution Place 1 drop into both eyes at bedtime. 05/10/16   [provider]  tiZANidine (ZANAFLEX) 2 MG tablet Take 1 tablet (2 mg total) by mouth 3 (three) times daily as needed for muscle spasms. Patient not taking: Reported on 06/23/2018 12/28/17   Nicholes Mango, MD    Allergies  Allergen Reactions  . Codeine Itching and Rash    Family History  Problem Relation Age of Onset  . CAD Father     Social History Social History   Tobacco Use  . Smoking status: Former Smoker    Packs/day: 0.50    Years: 55.00    Pack years: 27.50    Last attempt to quit: 11/21/2017    Years since quitting: 0.5  . Smokeless tobacco: Never Used  Substance Use Topics  . Alcohol use: No  . Drug use: No    Review of Systems  Constitutional: Negative for fever. Eyes: Negative for visual changes. ENT: Negative for sore throat. Cardiovascular: Negative for chest pain. Respiratory:  Negative for shortness of breath. Gastrointestinal: Negative for abdominal pain, vomiting and diarrhea. Genitourinary: Negative for dysuria. Musculoskeletal: Negative for back pain.  Positive for left hip pain as per HPI. Skin: Negative for rash. Neurological: Negative for headache.  ____________________________________________   PHYSICAL EXAM:  VITAL SIGNS: ED Triage Vitals  Enc Vitals Group     BP 06/23/18 1112 101/78     Pulse Rate 06/23/18 1112 74     Resp 06/23/18 1112 19     Temp 06/23/18 1112 98.1 F (36.7 C)     Temp Source 06/23/18 1112 Oral     SpO2 06/23/18 1112 (!) 89 %     Weight 06/23/18 1110 169 lb (76.7 kg)     Height 06/23/18 1110 5\' 1"  (1.549 m)     Head Circumference --      Peak Flow --      Pain Score 06/23/18 1109 0  Pain Loc --      Pain Edu? --      Excl. in [place]? --      Constitutional: Alert and oriented.  HEENT      Head: Normocephalic and atraumatic.      Eyes: Conjunctivae are normal. Pupils equal and round.       Ears:         Nose: No congestion/rhinnorhea.      Mouth/Throat: Mucous membranes are moist.      Neck: No stridor.  No posterior midline C-spine tenderness to palpation or range of motion. Cardiovascular/Chest: Normal rate, regular rhythm.  No murmurs, rubs, or gallops. Respiratory: Normal respiratory effort without tachypnea nor retractions. Breath sounds are clear and equal bilaterally. No wheezes/rales/rhonchi. Gastrointestinal: Soft. No distention, no guarding, no rebound. Nontender.    Genitourinary/rectal:Deferred Musculoskeletal: Pelvis stable.  Left hip pain laterally to palpation and with range motion.  No shortening or rotation.  Neurovascularly intact.  No other extremity pain or deformities. No joint effusions.  No lower extremity tenderness.  No edema. Neurologic:  Normal speech and language. No gross or focal neurologic deficits are appreciated. Skin:  Skin is warm, dry and intact. No rash noted. Psychiatric: Mood  and affect are normal. Speech and behavior are normal. Patient exhibits appropriate insight and judgment.   ____________________________________________  LABS (pertinent positives/negatives) I, Lisa Roca, MD the attending physician have reviewed the labs noted below.  Labs Reviewed  BASIC METABOLIC PANEL - Abnormal; Notable for the following components:      Result Value   Glucose, Bld 142 (*)    Creatinine, Ser 1.16 (*)    Calcium 8.7 (*)    GFR calc non Af Amer 42 (*)    GFR calc Af Amer 49 (*)    All other components within normal limits  CBC WITH DIFFERENTIAL/PLATELET - Abnormal; Notable for the following components:   RBC 3.19 (*)    Hemoglobin 10.5 (*)    HCT 33.3 (*)    MCV 104.4 (*)    Eosinophils Absolute 0.7 (*)    All other components within normal limits  URINALYSIS, COMPLETE (UACMP) WITH MICROSCOPIC - Abnormal; Notable for the following components:   Color, Urine YELLOW (*)    APPearance CLEAR (*)    Glucose, UA 50 (*)    All other components within normal limits  TROPONIN I  TROPONIN I  TROPONIN I  TROPONIN I    ____________________________________________    EKG I, Lisa Roca, MD, the attending physician have personally viewed and interpreted all ECGs.  78 bpm.  Normal sinus rhythm.  Narrow QS.  Normal axis.  Nonspecific ST and T wave. ____________________________________________  RADIOLOGY   CT head without contrast, radiologist report reviewed:  IMPRESSION: 1. No acute intracranial abnormality or acute traumatic injury identified. 2. Chronic right mastoid effusion. 3. Chronic white matter changes, most commonly due to small vessel disease. 4. Incidental pericallosal lipoma (normal variant).  Hip left x-ray reviewed by me and interpreted by radiologist:  IMPRESSION: 1. No acute fracture or dislocation identified about the left hip or pelvis. If occult hip fracture is suspected or if the patient is unable to weightbear, MRI is the  preferred modality for further evaluation. 2. Possible chronic AVN of the right femoral head with increased subchondral sclerosis since 2016. 3. Iliofemoral and Aortic Atherosclerosis (ICD10-I70.0).   Mri brain: discussed with radiologist:  IMPRESSION: 1. Evidence of trace intraventricular hemorrhage layering in the right occipital horn, most likely posttraumatic. 2. But  no other intracranial hemorrhage or acute intracranial abnormality is identified; otherwise stable noncontrast MRI appearance of the brain since May. 3. Chronic mastoid effusions, mostly on the right.  Left hip mri:   Discussed with radiologist: IMPRESSION: 1. Left pubic body fracture with prominent hemorrhage in the left side of the pelvis. 2. Strains of the left abductor muscles. 3. 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 4. Probable hairline fracture of the left ilium adjacent to the left SI joint posteriorly. 5. Critical Value/emergent results were called by telephone at the time of interpretation on 06/23/2018 at 5:16 pm to Dr. Lisa Roca , who verbally acknowledged these results.   __________________________________________  PROCEDURES  Procedure(s) performed: None  Procedures  Critical Care performed: CRITICAL CARE Performed by: Lisa Roca   Total critical care time: 30 minutes  Critical care time was exclusive of separately billable procedures and treating other patients.  Critical care was necessary to treat or prevent imminent or life-threatening deterioration.  Critical care was time spent personally by me on the following activities: development of treatment plan with patient and/or surrogate as well as nursing, discussions with consultants, evaluation of patient's response to treatment, examination of patient, obtaining history from patient or surrogate, ordering and performing treatments and interventions, ordering and review of laboratory studies, ordering and review  of radiographic studies, pulse oximetry and re-evaluation of patient's condition.    ____________________________________________  ED COURSE / ASSESSMENT AND PLAN  Pertinent labs & imaging results that were available during my care of the patient were reviewed by me and considered in my medical decision making (see chart for details).     Complaint was for evaluation of left hip pain after fall.  It sound like the fall was due to dizziness.  Initially felt because of dizziness was most likely peripheral vertigo.  Patient given treatment for peripheral vertigo.  X-ray shows no acute fracture.  Patient really unable to stand up secondary to pain in that left hip will need additional advanced imaging to ensure no occult fracture.  In addition patient continues to be quite symptomatically the dizziness.  Head CT showed no intracranial source of dizziness or mass or tumor or stroke.  Will obtain MRI to ensure no posterior circulation stroke as a source.  Discussed with hospitalist to admit.  Patient is so symptomatic she is unable to return home at this point time.  We will continue to work for symptomatic relief.    CONSULTATIONS:   Hospitalist for admission, Dr. Modena Nunnery.   Patient / Family / Caregiver informed of clinical course, medical decision-making process, and agree with plan.   Addended to include I took phone call from radiologist regarding pelvic fracture and associated pelvic bleeding.  Patient is not unstable.  I took phone call from radiologist regarding MRI result with punctate area of intracranial bleed likely traumatic.  I spoke with the neurosurgeon on-call, Dr. Lacinda Axon who will consult in the hospital.  I updated hospitalist. ___________________________________________   FINAL CLINICAL IMPRESSION(S) / ED DIAGNOSES   Final diagnoses:  Fall, initial encounter  Left hip pain  Vertigo  Intracranial hemorrhage (HCC)  Closed fracture of left pubis, unspecified  portion of pubis, initial encounter ([place])      ___________________________________________         Note: This dictation was prepared with Dragon dictation. Any transcriptional errors that result from this process are unintentional    Lisa Roca, MD 06/23/18 1640    Lisa Roca, MD 06/23/18 [id number]

## 2018-06-23 NOTE — ED Notes (Signed)
Pt vomiting and unable to keep liquids down. Oral medications held at this time. MD notified.

## 2018-06-23 NOTE — Progress Notes (Signed)
Chaplain responded to an OR for an AD. Pt was alert . Son was at the bedside. Pt had a sad expression. Chaplain used humor to bring out a reaction in the Pt. Chaplain provided education and invitation for prayer. Pt smiled and accepted the invitation. Chaplain prayer for her comfort and peach having lost 2 daughters to death. Chaplain will follow up   06/23/18 1900  Clinical Encounter Type  Visited With Patient and family together  Visit Type Initial  Referral From Nurse  Spiritual Encounters  Spiritual Needs Brochure;Prayer

## 2018-06-23 NOTE — ED Notes (Signed)
Patient transported to MRI 

## 2018-06-23 NOTE — ED Triage Notes (Signed)
Pt arrived via ems for report of fall - pt got up to go to bathroom becmae dizzy and lost footing - she now c/o left hip pain

## 2018-06-23 NOTE — H&P (Signed)
[facility] NAME: Debra [name]    MR#:  [id number]  DATE OF BIRTH:  Nov 04, 1932  DATE OF ADMISSION:  06/23/2018  PRIMARY CARE PHYSICIAN: Jodi Marble, MD   REQUESTING/REFERRING PHYSICIAN: Lord  CHIEF COMPLAINT:   Chief Complaint  Patient presents with  . Fall  . Hip Pain  . Dizziness    HISTORY OF PRESENT ILLNESS: Debra [name]  is a 84 y.o. female with a known history of asthma, CHF, COPD, myocardial infarction-lives with her granddaughter in a trailer home and have complaints of intermittent dizziness in the past. Today was in the bathroom and trying to stand up felt dizzy and lost her balance and fell on her left side.  Since then was hurting excessively in her left side hip and also feeling dizzy with trying to stand up every time so came to emergency room. Initial CT scan on the head was negative urinalysis was negative.  But patient continued to feel excessively dizzy and could not stand up due to pain in left hip so suggested to do more work-up and admit to hospitalist service. ER physician ordered MRI on the brain and pelvis, it reported to have a small hemorrhage in the ventricle and pelvic fracture with some hemorrhage. ER physician already spoke to neurosurgical physician on call and he suggested to just keep the patient over here and they will follow-up.  PAST MEDICAL HISTORY:   Past Medical History:  Diagnosis Date  . Asthma   . CHF (congestive heart failure) ([place])   . COPD (chronic obstructive pulmonary disease) ([place])   . Myocardial infarction [facility])     PAST SURGICAL HISTORY:  Past Surgical History:  Procedure Laterality Date  . ABDOMINAL HYSTERECTOMY    . BREAST SURGERY    . BYPASS GRAFT  2007   triple  . CHOLECYSTECTOMY    . CORONARY ARTERY BYPASS GRAFT    . TONSILLECTOMY      SOCIAL HISTORY:  Social History   Tobacco Use  . Smoking status: Former Smoker    Packs/day: 0.50    Years: 55.00     Pack years: 27.50    Last attempt to quit: 11/21/2017    Years since quitting: 0.5  . Smokeless tobacco: Never Used  Substance Use Topics  . Alcohol use: No    FAMILY HISTORY:  Family History  Problem Relation Age of Onset  . CAD Father     DRUG ALLERGIES:  Allergies  Allergen Reactions  . Codeine Itching and Rash    REVIEW OF SYSTEMS:   CONSTITUTIONAL: No fever, fatigue or weakness.  EYES: No blurred or double vision.  EARS, NOSE, AND THROAT: No tinnitus or ear pain.  RESPIRATORY: No cough, shortness of breath, wheezing or hemoptysis.  CARDIOVASCULAR: No chest pain, orthopnea, edema.  GASTROINTESTINAL: No nausea, vomiting, diarrhea or abdominal pain.  GENITOURINARY: No dysuria, hematuria.  ENDOCRINE: No polyuria, nocturia,  HEMATOLOGY: No anemia, easy bruising or bleeding SKIN: No rash or lesion. MUSCULOSKELETAL: No joint pain or arthritis.   NEUROLOGIC: No tingling, numbness, weakness.  Has excessive dizziness. PSYCHIATRY: No anxiety or depression.   MEDICATIONS AT HOME:  Prior to Admission medications   Medication Sig Start Date End Date Taking? Authorizing Provider  albuterol (PROVENTIL HFA;VENTOLIN HFA) 108 (90 Base) MCG/ACT inhaler Inhale 1-2 puffs into the lungs every 6 (six) hours as needed for wheezing or shortness of breath.   Yes [provider]  albuterol (PROVENTIL) (2.5 MG/3ML) 0.083%  nebulizer solution Take 2.5 mg by nebulization 4 (four) times daily as needed for wheezing or shortness of breath.   Yes [provider]  ALPRAZolam (XANAX) 0.5 MG tablet Take 1 tablet (0.5 mg total) by mouth 2 (two) times daily. 12/28/17  Yes Gouru, Illene Silver, MD  aspirin 81 MG chewable tablet Chew by mouth every morning.   Yes [provider]  atorvastatin (LIPITOR) 10 MG tablet Take 10 mg by mouth every evening. 05/16/16  Yes [provider]  azelastine (ASTELIN) 0.1 % nasal spray Place 1 spray into both nostrils daily. 05/30/18  Yes [provider]  clopidogrel (PLAVIX) 75 MG tablet Take 75 mg by mouth every morning.    Yes [provider]  diclofenac sodium (VOLTAREN) 1 % GEL Apply 1 g topically 2 (two) times daily. Apply to knees 06/02/18  Yes [provider]  docusate sodium (COLACE) 100 MG capsule Take 1 capsule (100 mg total) by mouth daily as needed for mild constipation. 12/28/17  Yes Gouru, Illene Silver, MD  feeding supplement, ENSURE ENLIVE, (ENSURE ENLIVE) LIQD Take 237 mLs by mouth 2 (two) times daily between meals. 12/08/16  Yes Mody, Ulice Bold, MD  FLUoxetine (PROZAC) 20 MG capsule Take 1 capsule (20 mg total) by mouth daily. 09/01/15  Yes Aldean Jewett, MD  guaiFENesin (ROBITUSSIN) 100 MG/5ML SOLN Take 5 mLs (100 mg total) by mouth every 4 (four) hours as needed for cough or to loosen phlegm. 12/28/17  Yes Gouru, Illene Silver, MD  HYDROcodone-acetaminophen (NORCO/VICODIN) 5-325 MG tablet Take 1 tablet by mouth every 6 (six) hours as needed for moderate pain. 12/28/17  Yes Gouru, Aruna, MD  ipratropium-albuterol (DUONEB) 0.5-2.5 (3) MG/3ML SOLN Take 3 mLs by nebulization every 6 (six) hours as needed. 12/28/17  Yes Gouru, Illene Silver, MD  meclizine (ANTIVERT) 12.5 MG tablet Take 1 tablet (12.5 mg total) by mouth 3 (three) times daily as needed for dizziness. 01/27/18  Yes Fritzi Mandes, MD  metoprolol tartrate (LOPRESSOR) 25 MG tablet Take 1 tablet (25 mg total) by mouth at bedtime. 01/27/18  Yes Fritzi Mandes, MD  OS-CAL CALCIUM + D3 500-200 MG-UNIT TABS Take 1 tablet by mouth 2 (two) times daily. 05/05/18  Yes [provider]  pantoprazole (PROTONIX) 40 MG tablet Take 40 mg by mouth daily.   Yes [provider]  senna-docusate (SENOKOT-S) 8.6-50 MG tablet Take 1 tablet by mouth at bedtime as needed for mild constipation. 12/28/17  Yes Gouru, Illene Silver, MD  tiotropium (SPIRIVA HANDIHALER) 18 MCG inhalation capsule Place 1 capsule (18 mcg total) into inhaler and inhale every morning. 09/01/15  Yes Aldean Jewett, MD   traZODone (DESYREL) 50 MG tablet Take 50-100 mg by mouth at bedtime as needed for sleep. 06/16/18  Yes [provider]  latanoprost (XALATAN) 0.005 % ophthalmic solution Place 1 drop into both eyes at bedtime. 05/10/16   [provider]  tiZANidine (ZANAFLEX) 2 MG tablet Take 1 tablet (2 mg total) by mouth 3 (three) times daily as needed for muscle spasms. Patient not taking: Reported on 06/23/2018 12/28/17   Nicholes Mango, MD      PHYSICAL EXAMINATION:   VITAL SIGNS: Blood pressure (!) 118/91, pulse 75, temperature 98.1 F (36.7 C), temperature source Oral, resp. rate 20, height 5\' 1"  (1.549 m), weight 76.7 kg, SpO2 92 %.  GENERAL:  84 y.o.-year-old patient lying in the bed with no acute distress.  EYES: Pupils equal, round, reactive to light and accommodation. No scleral icterus. Extraocular muscles intact.  HEENT:  Head atraumatic, normocephalic. Oropharynx and nasopharynx clear.  NECK:  Supple, no jugular venous distention. No thyroid enlargement, no tenderness.  LUNGS: Normal breath sounds bilaterally, no wheezing, rales,rhonchi or crepitation. No use of accessory muscles of respiration.  CARDIOVASCULAR: S1, S2 normal. No murmurs, rubs, or gallops.  ABDOMEN: Soft, nontender, nondistended. Bowel sounds present. No organomegaly or mass.  EXTREMITIES: No pedal edema, cyanosis, or clubbing.  NEUROLOGIC: Cranial nerves II through XII are intact. Muscle strength 4/5 in both upper extremities 1 out of 5 in both lower extremities as she is not moving much due to severe pain. Sensation intact. Gait not checked.  PSYCHIATRIC: The patient is alert and oriented x 3.  SKIN: No obvious rash, lesion, or ulcer.   LABORATORY PANEL:   CBC Recent Labs  Lab 06/23/18 1118  WBC 8.4  HGB 10.5*  HCT 33.3*  PLT 218  MCV 104.4*  MCH 32.9  MCHC 31.5  RDW 13.5  LYMPHSABS 1.7  MONOABS 0.5  EOSABS 0.7*  BASOSABS 0.0    ------------------------------------------------------------------------------------------------------------------  Chemistries  Recent Labs  Lab 06/23/18 1118  NA 139  K 3.6  CL 100  CO2 30  GLUCOSE 142*  BUN 15  CREATININE 1.16*  CALCIUM 8.7*   ------------------------------------------------------------------------------------------------------------------ estimated creatinine clearance is 34.5 mL/min (A) (by C-G formula based on SCr of 1.16 mg/dL (H)). ------------------------------------------------------------------------------------------------------------------ No results for input(s): TSH, T4TOTAL, T3FREE, THYROIDAB in the last 72 hours.  Invalid input(s): FREET3   Coagulation profile No results for input(s): INR, PROTIME in the last 168 hours. ------------------------------------------------------------------------------------------------------------------- No results for input(s): DDIMER in the last 72 hours. -------------------------------------------------------------------------------------------------------------------  Cardiac Enzymes Recent Labs  Lab 06/23/18 1118  TROPONINI <0.03   ------------------------------------------------------------------------------------------------------------------ Invalid input(s): POCBNP  ---------------------------------------------------------------------------------------------------------------  Urinalysis    Component Value Date/Time   COLORURINE YELLOW (A) 06/23/2018 1402   APPEARANCEUR CLEAR (A) 06/23/2018 1402   APPEARANCEUR Cloudy 09/13/2014 1625   LABSPEC 1.015 06/23/2018 1402   LABSPEC 1.027 09/13/2014 1625   PHURINE 6.0 06/23/2018 1402   GLUCOSEU 50 (A) 06/23/2018 1402   GLUCOSEU Negative 09/13/2014 1625   HGBUR NEGATIVE 06/23/2018 1402   BILIRUBINUR NEGATIVE 06/23/2018 1402   BILIRUBINUR Negative 09/13/2014 1625   KETONESUR NEGATIVE 06/23/2018 1402   PROTEINUR NEGATIVE 06/23/2018 1402   NITRITE  NEGATIVE 06/23/2018 1402   LEUKOCYTESUR NEGATIVE 06/23/2018 1402   LEUKOCYTESUR Negative 09/13/2014 1625     RADIOLOGY: Ct Head Wo Contrast  Result Date: 06/23/2018 CLINICAL DATA:  84 year old female with dizziness and fall striking head on floor. EXAM: CT HEAD WITHOUT CONTRAST TECHNIQUE: Contiguous axial images were obtained from the base of the skull through the vertex without intravenous contrast. COMPARISON:  Brain MRI 01/27/2018, 02/23/2011. FINDINGS: Brain: Small congenital pericallosal lipoma (normal variant). Cavum septum pellucidum, normal variant. Cerebral volume and ventricle size are stable since the May MRI and within normal limits for age. Patchy and confluent bilateral cerebral white matter hypodensity. No midline shift, ventriculomegaly, mass effect, evidence of mass lesion, intracranial hemorrhage or evidence of cortically based acute infarction. No cortical encephalomalacia identified. Vascular: Calcified atherosclerosis at the skull base. No suspicious intracranial vascular hyperdensity. Skull: Intact. Sinuses/Orbits: Right mastoid effusion is chronic. Negative visible nasopharynx. The remaining visualized paranasal sinuses and mastoids are stable and well pneumatized. Other: No scalp hematoma identified. Orbits soft tissues are within normal limits. IMPRESSION: 1. No acute intracranial abnormality or acute traumatic injury identified. 2. Chronic right mastoid effusion. 3. Chronic white matter changes, most commonly due to small vessel disease. 4. Incidental pericallosal lipoma (normal  variant). Electronically Signed   By: Genevie Ann M.D.   On: 06/23/2018 12:00   Mr Brain Wo Contrast  Result Date: 06/23/2018 CLINICAL DATA:  84 year old female with dizziness, fall and struck head on the floor. EXAM: MRI HEAD WITHOUT CONTRAST TECHNIQUE: Multiplanar, multiecho pulse sequences of the brain and surrounding structures were obtained without intravenous contrast. COMPARISON:  Head CT without  contrast today.  Brain MRI 01/27/2018. FINDINGS: Brain: Midline small pericallosal lipoma and cavum septum pellucidum (normal variants) redemonstrated. No restricted diffusion to suggest acute infarction. No midline shift, mass effect, evidence of mass lesion, ventriculomegaly, extra-axial collection. Cervicomedullary junction and pituitary are within normal limits. Patchy nonspecific cerebral white matter T2 and FLAIR hyperintensity is stable. There is trace layering hemorrhage in the right occipital horn (series 8, image 14) which is new. No other intraventricular or intracranial hemorrhage identified. The deep gray matter nuclei, brainstem, and cerebellum remain normal for age. Vascular: Major intracranial vascular flow voids are stable and preserved. Skull and upper cervical spine: Negative visible cervical spine. Visualized bone marrow signal is within normal limits. Sinuses/Orbits: Stable and negative. Other: Right mastoid effusion has not significantly changed. There is a small volume of retained secretions in the nasopharynx. Trace left mastoid effusion is stable. Grossly normal visible internal auditory structures. Scalp and face soft tissues appear negative. IMPRESSION: 1. Evidence of trace intraventricular hemorrhage layering in the right occipital horn, most likely posttraumatic. 2. But no other intracranial hemorrhage or acute intracranial abnormality is identified; otherwise stable noncontrast MRI appearance of the brain since May. 3. Chronic mastoid effusions, mostly on the right. Study discussed by telephone with Dr. Wells Guiles LORD on 06/23/2018 at 17:10 . Electronically Signed   By: Genevie Ann M.D.   On: 06/23/2018 17:12   Mr Hip Left Wo Contrast  Result Date: 06/23/2018 CLINICAL DATA:  Left hip pain since a fall today. EXAM: MR OF THE LEFT HIP WITHOUT CONTRAST TECHNIQUE: Multiplanar, multisequence MR imaging was performed. No intravenous contrast was administered. COMPARISON:  Radiographs dated  06/23/2018 FINDINGS: Bones: There is a subtle fracture of the left pubic body with edema extending into the left inferior pubic ramus. There are focal areas of stage I avascular necrosis of both femoral heads. There is a serpiginous area of edema in the posteromedial aspect of the left ilium best seen on image 9 of series 14 which may represent a subcortical fracture adjacent to the left SI joint. Old compression fracture of L4 treated with vertebroplasty. Articular cartilage and labrum Articular cartilage:  Slight diffuse joint space narrowing. Labrum:  No discrete tear. Joint or bursal effusion Joint effusion:  No effusion. Bursae: No bursitis. Muscles and tendons Muscles and tendons: Prominent edema in the adductor muscles of the left hip consistent with muscle strain. Other findings Miscellaneous: Prominent hemorrhage in the left side of the pelvis with a mass effect upon the bladder. Hemorrhage extends into the presacral space and extends anterior to the bladder and across the midline. IMPRESSION: 1. Left pubic body fracture with prominent hemorrhage in the left side of the pelvis. 2. Strains of the left abductor muscles. 3. 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 4. Probable hairline fracture of the left ilium adjacent to the left SI joint posteriorly. 5. Critical Value/emergent results were called by telephone at the time of interpretation on 06/23/2018 at 5:16 pm to Dr. Lisa Roca , who verbally acknowledged these results. Electronically Signed   By: Lorriane Shire M.D.   On: 06/23/2018 17:18  Dg Hip Unilat W Or Wo Pelvis 2-3 Views Left  Result Date: 06/23/2018 CLINICAL DATA:  84 year old female status post fall at home. Left hip pain. EXAM: DG HIP (WITH OR WITHOUT PELVIS) 2-3V LEFT COMPARISON:  CT Abdomen and Pelvis 08/27/2015. FINDINGS: Chronic aortic and iliofemoral calcified atherosclerosis. Previous lower lumbar vertebral body augmentation. The left femoral head is normally  located. The proximal left femur appears intact. The proximal right femur appears grossly intact although a geographic area of right femoral head subchondral sclerosis has increased since 2016. No pelvic fracture identified. Negative visible bowel gas pattern. IMPRESSION: 1. No acute fracture or dislocation identified about the left hip or pelvis. If occult hip fracture is suspected or if the patient is unable to weightbear, MRI is the preferred modality for further evaluation. 2. Possible chronic AVN of the right femoral head with increased subchondral sclerosis since 2016. 3. Iliofemoral and Aortic Atherosclerosis (ICD10-I70.0). Electronically Signed   By: Genevie Ann M.D.   On: 06/23/2018 11:48    EKG: Orders placed or performed during the hospital encounter of 06/23/18  . EKG 12-Lead  . EKG 12-Lead  . EKG 12-Lead  . EKG 12-Lead    IMPRESSION AND PLAN:  *Dizziness Could be cardiac arrhythmia Will monitor on telemetry and follow serial troponin. UA is negative. No signs of infection. Check orthostatic vitals.  *Intracranial hemorrhage Small hemorrhage possibly due to fall ER physician spoke to Dr. Harlan Stains He suggested to monitor here and they will follow-up inpatient. I will hold aspirin and Plavix for now.  *Pelvic fracture with hematoma Physical therapy and orthopedic consult Pain management with hydrocodone Hold aspirin and Plavix.  *History of coronary artery disease Hold antiplatelets currently because of hemorrhage Continue atorvastatin and metoprolol.  *COPD with chronic respiratory failure On 3 L oxygen at home. Continue nebulizer and inhalers as home.  All the records are reviewed and case discussed with ED provider. Management plans discussed with the patient, family and they are in agreement.  CODE STATUS: DNR Code Status History    Date Active Date Inactive Code Status Order ID Comments User Context   01/25/2018 1810 01/27/2018 1940 DNR [id number]   [name], MD Inpatient   12/22/2017 1631 12/28/2017 2218 DNR [id number]  Demetrios Loll, MD Inpatient   12/05/2016 1102 12/08/2016 1724 DNR [id number]  Hillary Bow, MD ED   10/05/2016 2302 10/10/2016 2354 Full Code [id number]  Lance Coon, MD Inpatient   06/10/2016 1032 06/10/2016 1050 Full Code [id number]  Theodoro Grist, MD Inpatient   09/14/2015 2019 09/16/2015 2034 Full Code [id number]  Theodoro Grist, MD Inpatient   08/27/2015 0805 09/01/2015 2042 Full Code [id number]  Harrie Foreman, MD Inpatient    Questions for Most Recent Historical Code Status (Order [id number])    Question Answer Comment   In the event of cardiac or respiratory ARREST Do not call a "code blue"    In the event of cardiac or respiratory ARREST Do not perform Intubation, CPR, defibrillation or ACLS    In the event of cardiac or respiratory ARREST Use medication by any route, position, wound care, and other measures to relive pain and suffering. May use oxygen, suction and manual treatment of airway obstruction as needed for comfort.        TOTAL TIME TAKING CARE OF THIS PATIENT: 45 minutes.    [name] M.D on 06/23/2018   Between 7am to 6pm - Pager - [phone]  After 6pm go to [email or web address] - password [email or web address]  [employer]  Office  [phone]  CC: Primary care physician; Jodi Marble, MD   Note: This dictation was prepared with Dragon dictation along with smaller phrase technology. Any transcriptional errors that result from this process are unintentional.

## 2018-06-23 NOTE — ED Notes (Signed)
Pt gave permission for updates to be given to daughter, Mitzie Na. Her contact number is [phone].

## 2018-06-23 NOTE — Progress Notes (Signed)
Family Meeting Note  Advance Directive:yes  Today a meeting took place with the Patient.  The following clinical team members were present during this meeting:MD  The following were discussed:Patient's diagnosis: CAD, Intracranial hemorrhage, pelvic fracture, dizziness , Patient's progosis: Unable to determine and Goals for treatment: DNR  Additional follow-up to be provided: Orthopedic surgery  Time spent during discussion:20 minutes  Debra Basta, MD

## 2018-06-23 NOTE — ED Notes (Signed)
Attempted to stand pt. Pt complained of left hip pain and severe dizziness with attempt. Pt unable to stand independently. MD notified.

## 2018-06-24 LAB — BASIC METABOLIC PANEL
Anion gap: 5 (ref 5–15)
BUN: 16 mg/dL (ref 8–23)
CALCIUM: 8.3 mg/dL — AB (ref 8.9–10.3)
CO2: 30 mmol/L (ref 22–32)
CREATININE: 1.23 mg/dL — AB (ref 0.44–1.00)
Chloride: 105 mmol/L (ref 98–111)
GFR calc non Af Amer: 39 mL/min — ABNORMAL LOW (ref >60)
GFR, EST AFRICAN AMERICAN: 46 mL/min — AB (ref >60)
Glucose, Bld: 110 mg/dL — ABNORMAL HIGH (ref 70–99)
Potassium: 4.2 mmol/L (ref 3.5–5.1)
Sodium: 140 mmol/L (ref 135–145)

## 2018-06-24 LAB — TROPONIN I

## 2018-06-24 LAB — CBC
HCT: 27.3 % — ABNORMAL LOW (ref 36.0–46.0)
Hemoglobin: 8.7 g/dL — ABNORMAL LOW (ref 12.0–15.0)
MCH: 33.7 pg (ref 26.0–34.0)
MCHC: 31.9 g/dL (ref 30.0–36.0)
MCV: 105.8 fL — ABNORMAL HIGH (ref 80.0–100.0)
NRBC: 0 % (ref 0.0–0.2)
PLATELETS: 185 10*3/uL (ref 150–400)
RBC: 2.58 MIL/uL — ABNORMAL LOW (ref 3.87–5.11)
RDW: 13.7 % (ref 11.5–15.5)
WBC: 8.9 10*3/uL (ref 4.0–10.5)

## 2018-06-24 MED ORDER — HEPARIN SODIUM (PORCINE) 5000 UNIT/ML IJ SOLN
5000.0000 [IU] | Freq: Three times a day (TID) | INTRAMUSCULAR | Status: DC
  Administered 2018-06-24 – 2018-06-25 (×4): 5000 [IU] via SUBCUTANEOUS
  Filled 2018-06-24 (×4): qty 1

## 2018-06-24 NOTE — Progress Notes (Signed)
[facility] [place] NAME: Debra [name]    MR#:  [id number]  DATE OF BIRTH:  1933-01-04  SUBJECTIVE:  came in after she had all fall at home. She is complaining of pain on her left hip. Family in the room. She felt dizzy. Daughter stays patient has been having falls at home.  REVIEW OF SYSTEMS:   Review of Systems  Constitutional: Negative for chills, fever and weight loss.  HENT: Negative for ear discharge, ear pain and nosebleeds.   Eyes: Negative for blurred vision, pain and discharge.  Respiratory: Negative for sputum production, shortness of breath, wheezing and stridor.   Cardiovascular: Negative for chest pain, palpitations, orthopnea and PND.  Gastrointestinal: Negative for abdominal pain, diarrhea, nausea and vomiting.  Genitourinary: Negative for frequency and urgency.  Musculoskeletal: Negative for back pain and joint pain.  Neurological: Negative for sensory change, speech change, focal weakness and weakness.  Psychiatric/Behavioral: Negative for depression and hallucinations. The patient is not nervous/anxious.    Tolerating Diet:yes Tolerating PT: pending  DRUG ALLERGIES:   Allergies  Allergen Reactions  . Codeine Itching and Rash    VITALS:  Blood pressure (!) 115/41, pulse 66, temperature 97.9 F (36.6 C), temperature source Oral, resp. rate 18, height 5\' 1"  (1.549 m), weight 76.7 kg, SpO2 95 %.  PHYSICAL EXAMINATION:   Physical Exam  GENERAL:  84 y.o.-year-old patient lying in the bed with no acute distress.  EYES: Pupils equal, round, reactive to light and accommodation. No scleral icterus. Extraocular muscles intact.  HEENT: Head atraumatic, normocephalic. Oropharynx and nasopharynx clear.  NECK:  Supple, no jugular venous distention. No thyroid enlargement, no tenderness.  LUNGS:coarse breath sounds bilaterally, no wheezing, rales, rhonchi. No use of accessory muscles of respiration.  CARDIOVASCULAR:  S1, S2 normal. No murmurs, rubs, or gallops.  ABDOMEN: Soft, nontender, nondistended. Bowel sounds present. No organomegaly or mass.  EXTREMITIES: No cyanosis, clubbing or edema b/l.    NEUROLOGIC: Cranial nerves II through XII are intact. No focal Motor or sensory deficits b/l.   PSYCHIATRIC:  patient is alert and oriented x 3.  SKIN: No obvious rash, lesion, or ulcer.   LABORATORY PANEL:  CBC Recent Labs  Lab 06/24/18 0217  WBC 8.9  HGB 8.7*  HCT 27.3*  PLT 185    Chemistries  Recent Labs  Lab 06/24/18 0217  NA 140  K 4.2  CL 105  CO2 30  GLUCOSE 110*  BUN 16  CREATININE 1.23*  CALCIUM 8.3*   Cardiac Enzymes Recent Labs  Lab 06/24/18 0217  TROPONINI <0.03   RADIOLOGY:  Ct Head Wo Contrast  Result Date: 06/23/2018 CLINICAL DATA:  84 year old female with dizziness and fall striking head on floor. EXAM: CT HEAD WITHOUT CONTRAST TECHNIQUE: Contiguous axial images were obtained from the base of the skull through the vertex without intravenous contrast. COMPARISON:  Brain MRI 01/27/2018, 02/23/2011. FINDINGS: Brain: Small congenital pericallosal lipoma (normal variant). Cavum septum pellucidum, normal variant. Cerebral volume and ventricle size are stable since the May MRI and within normal limits for age. Patchy and confluent bilateral cerebral white matter hypodensity. No midline shift, ventriculomegaly, mass effect, evidence of mass lesion, intracranial hemorrhage or evidence of cortically based acute infarction. No cortical encephalomalacia identified. Vascular: Calcified atherosclerosis at the skull base. No suspicious intracranial vascular hyperdensity. Skull: Intact. Sinuses/Orbits: Right mastoid effusion is chronic. Negative visible nasopharynx. The remaining visualized paranasal sinuses and mastoids are stable and well pneumatized. Other: No scalp hematoma  identified. Orbits soft tissues are within normal limits. IMPRESSION: 1. No acute intracranial abnormality or acute  traumatic injury identified. 2. Chronic right mastoid effusion. 3. Chronic white matter changes, most commonly due to small vessel disease. 4. Incidental pericallosal lipoma (normal variant). Electronically Signed   By: Genevie Ann M.D.   On: 06/23/2018 12:00   Mr Brain Wo Contrast  Result Date: 06/23/2018 CLINICAL DATA:  84 year old female with dizziness, fall and struck head on the floor. EXAM: MRI HEAD WITHOUT CONTRAST TECHNIQUE: Multiplanar, multiecho pulse sequences of the brain and surrounding structures were obtained without intravenous contrast. COMPARISON:  Head CT without contrast today.  Brain MRI 01/27/2018. FINDINGS: Brain: Midline small pericallosal lipoma and cavum septum pellucidum (normal variants) redemonstrated. No restricted diffusion to suggest acute infarction. No midline shift, mass effect, evidence of mass lesion, ventriculomegaly, extra-axial collection. Cervicomedullary junction and pituitary are within normal limits. Patchy nonspecific cerebral white matter T2 and FLAIR hyperintensity is stable. There is trace layering hemorrhage in the right occipital horn (series 8, image 14) which is new. No other intraventricular or intracranial hemorrhage identified. The deep gray matter nuclei, brainstem, and cerebellum remain normal for age. Vascular: Major intracranial vascular flow voids are stable and preserved. Skull and upper cervical spine: Negative visible cervical spine. Visualized bone marrow signal is within normal limits. Sinuses/Orbits: Stable and negative. Other: Right mastoid effusion has not significantly changed. There is a small volume of retained secretions in the nasopharynx. Trace left mastoid effusion is stable. Grossly normal visible internal auditory structures. Scalp and face soft tissues appear negative. IMPRESSION: 1. Evidence of trace intraventricular hemorrhage layering in the right occipital horn, most likely posttraumatic. 2. But no other intracranial hemorrhage or  acute intracranial abnormality is identified; otherwise stable noncontrast MRI appearance of the brain since May. 3. Chronic mastoid effusions, mostly on the right. Study discussed by telephone with Dr. Wells Guiles LORD on 06/23/2018 at 17:10 . Electronically Signed   By: Genevie Ann M.D.   On: 06/23/2018 17:12   Mr Hip Left Wo Contrast  Result Date: 06/23/2018 CLINICAL DATA:  Left hip pain since a fall today. EXAM: MR OF THE LEFT HIP WITHOUT CONTRAST TECHNIQUE: Multiplanar, multisequence MR imaging was performed. No intravenous contrast was administered. COMPARISON:  Radiographs dated 06/23/2018 FINDINGS: Bones: There is a subtle fracture of the left pubic body with edema extending into the left inferior pubic ramus. There are focal areas of stage I avascular necrosis of both femoral heads. There is a serpiginous area of edema in the posteromedial aspect of the left ilium best seen on image 9 of series 14 which may represent a subcortical fracture adjacent to the left SI joint. Old compression fracture of L4 treated with vertebroplasty. Articular cartilage and labrum Articular cartilage:  Slight diffuse joint space narrowing. Labrum:  No discrete tear. Joint or bursal effusion Joint effusion:  No effusion. Bursae: No bursitis. Muscles and tendons Muscles and tendons: Prominent edema in the adductor muscles of the left hip consistent with muscle strain. Other findings Miscellaneous: Prominent hemorrhage in the left side of the pelvis with a mass effect upon the bladder. Hemorrhage extends into the presacral space and extends anterior to the bladder and across the midline. IMPRESSION: 1. Left pubic body fracture with prominent hemorrhage in the left side of the pelvis. 2. Strains of the left abductor muscles. 3. 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 4. Probable hairline fracture of the left ilium adjacent to the left SI joint posteriorly. 5. Critical  Value/emergent results were called by telephone  at the time of interpretation on 06/23/2018 at 5:16 pm to Dr. Lisa Roca , who verbally acknowledged these results. Electronically Signed   By: Lorriane Shire M.D.   On: 06/23/2018 17:18   Dg Hip Unilat W Or Wo Pelvis 2-3 Views Left  Result Date: 06/23/2018 CLINICAL DATA:  84 year old female status post fall at home. Left hip pain. EXAM: DG HIP (WITH OR WITHOUT PELVIS) 2-3V LEFT COMPARISON:  CT Abdomen and Pelvis 08/27/2015. FINDINGS: Chronic aortic and iliofemoral calcified atherosclerosis. Previous lower lumbar vertebral body augmentation. The left femoral head is normally located. The proximal left femur appears intact. The proximal right femur appears grossly intact although a geographic area of right femoral head subchondral sclerosis has increased since 2016. No pelvic fracture identified. Negative visible bowel gas pattern. IMPRESSION: 1. No acute fracture or dislocation identified about the left hip or pelvis. If occult hip fracture is suspected or if the patient is unable to weightbear, MRI is the preferred modality for further evaluation. 2. Possible chronic AVN of the right femoral head with increased subchondral sclerosis since 2016. 3. Iliofemoral and Aortic Atherosclerosis (ICD10-I70.0). Electronically Signed   By: Genevie Ann M.D.   On: 06/23/2018 11:48   ASSESSMENT AND PLAN:    Abelina Ketron  is a 84 y.o. female with a known history of asthma, CHF, COPD, myocardial infarction-lives with her granddaughter in a trailer home and have complaints of intermittent dizziness in the past. Today was in the bathroom and trying to stand up felt dizzy and lost her balance and fell on her left side.  Since then was hurting excessively in her left side hip and also feeling dizzy with trying to stand up every time so came to emergency room.  *Dizziness Could be cardiac arrhythmia--pt is is Sinus rthym Will monitor on telemetry and follow serial troponin. UA is negative. No signs of  infection.  *Intracranial hemorrhage/Small hemorrhage possibly due to fall patient was evaluated by neurosurgery. Continue to monitor. Hold aspirin Plavix. Start physical therapy. Patient neurologically intact.  *Left Pelvic fracture with hematoma Physical therapy and orthopedic consult with Dr Sabra Heck appreciated-- recommends physical therapy weight-bearing as tolerated. Pain management with hydrocodone Hold aspirin and Plavix. Monitor hemoglobin so far stable. Transfuse as needed.  *History of coronary artery disease Hold antiplatelets currently because of hemorrhage Continue atorvastatin and metoprolol.  *COPD with chronic respiratory failure On 3 L oxygen at home. Continue nebulizer and inhalers as home.  Case discussed with Care Management/Social Worker. Management plans discussed with the patient, family and they are in agreement.  CODE STATUS: DNR  DVT Prophylaxis: heparin SQ  TOTAL TIME TAKING CARE OF THIS PATIENT: 30 minutes.  >50% time spent on counselling and coordination of care  POSSIBLE D/C IN 2-3* DAYS, DEPENDING ON CLINICAL CONDITION.  Note: This dictation was prepared with Dragon dictation along with smaller phrase technology. Any transcriptional errors that result from this process are unintentional.  Fritzi Mandes M.D on 06/24/2018 at 2:43 PM  Between 7am to 6pm - Pager - [phone]  After 6pm go to [email or web address] - password [email or web address] [facility]  Office  [phone]  CC: Primary care physician; Jodi Marble, MDPatient ID: Debra [name], female   DOB: Nov 12, 1932, 84 y.o.   MRN: [id number]

## 2018-06-24 NOTE — Consult Note (Signed)
ORTHOPAEDIC CONSULTATION  REQUESTING PHYSICIAN: Fritzi Mandes, MD  Chief Complaint: Left hip pain  HPI: [name] is a 84 y.o. female who complains of left hip pain after a fall at home yesterday.  The patient got dizzy and fell in her bathroom and was unable to ambulate.  She was brought to the emergency room where exam, x-rays, and MRI of the pelvis showed nondisplaced fractures of the left superior and inferior pubic rami.  Is admitted for pain control and PT.  May need placement.  She has a history of asthma congestive heart failure COPD and prior heart attack.  Past Medical History:  Diagnosis Date  . Asthma   . CHF (congestive heart failure) ([place])   . COPD (chronic obstructive pulmonary disease) ([place])   . Myocardial infarction [facility])    Past Surgical History:  Procedure Laterality Date  . ABDOMINAL HYSTERECTOMY    . BREAST SURGERY    . BYPASS GRAFT  2007   triple  . CHOLECYSTECTOMY    . CORONARY ARTERY BYPASS GRAFT    . TONSILLECTOMY     Social History   Socioeconomic History  . Marital status: Widowed    Spouse name: Not on file  . Number of children: Not on file  . Years of education: Not on file  . Highest education level: Not on file  Occupational History  . Not on file  Social Needs  . Financial resource strain: Not on file  . Food insecurity:    Worry: Not on file    Inability: Not on file  . Transportation needs:    Medical: Not on file    Non-medical: Not on file  Tobacco Use  . Smoking status: Former Smoker    Packs/day: 0.50    Years: 55.00    Pack years: 27.50    Last attempt to quit: 11/21/2017    Years since quitting: 0.5  . Smokeless tobacco: Never Used  Substance and Sexual Activity  . Alcohol use: No  . Drug use: No  . Sexual activity: Not on file  Lifestyle  . Physical activity:    Days per week: Not on file    Minutes per session: Not on file  . Stress: Not on file  Relationships  . Social connections:    Talks on phone: Not  on file    Gets together: Not on file    Attends religious service: Not on file    Active member of club or organization: Not on file    Attends meetings of clubs or organizations: Not on file    Relationship status: Not on file  Other Topics Concern  . Not on file  Social History Narrative  . Not on file   Family History  Problem Relation Age of Onset  . CAD Father    Allergies  Allergen Reactions  . Codeine Itching and Rash   Prior to Admission medications   Medication Sig Start Date End Date Taking? Authorizing Provider  albuterol (PROVENTIL HFA;VENTOLIN HFA) 108 (90 Base) MCG/ACT inhaler Inhale 1-2 puffs into the lungs every 6 (six) hours as needed for wheezing or shortness of breath.   Yes [provider]  albuterol (PROVENTIL) (2.5 MG/3ML) 0.083% nebulizer solution Take 2.5 mg by nebulization 4 (four) times daily as needed for wheezing or shortness of breath.   Yes [provider]  ALPRAZolam (XANAX) 0.5 MG tablet Take 1 tablet (0.5 mg total) by mouth 2 (two) times daily. 12/28/17  Yes Gouru, Illene Silver,  MD  aspirin 81 MG chewable tablet Chew by mouth every morning.   Yes [provider]  atorvastatin (LIPITOR) 10 MG tablet Take 10 mg by mouth every evening. 05/16/16  Yes [provider]  azelastine (ASTELIN) 0.1 % nasal spray Place 1 spray into both nostrils daily. 05/30/18  Yes [provider]  clopidogrel (PLAVIX) 75 MG tablet Take 75 mg by mouth every morning.    Yes [provider]  diclofenac sodium (VOLTAREN) 1 % GEL Apply 1 g topically 2 (two) times daily. Apply to knees 06/02/18  Yes [provider]  docusate sodium (COLACE) 100 MG capsule Take 1 capsule (100 mg total) by mouth daily as needed for mild constipation. 12/28/17  Yes Gouru, Illene Silver, MD  feeding supplement, ENSURE ENLIVE, (ENSURE ENLIVE) LIQD Take 237 mLs by mouth 2 (two) times daily between meals. 12/08/16  Yes Mody, Ulice Bold, MD  FLUoxetine (PROZAC) 20 MG  capsule Take 1 capsule (20 mg total) by mouth daily. 09/01/15  Yes Aldean Jewett, MD  guaiFENesin (ROBITUSSIN) 100 MG/5ML SOLN Take 5 mLs (100 mg total) by mouth every 4 (four) hours as needed for cough or to loosen phlegm. 12/28/17  Yes Gouru, Illene Silver, MD  HYDROcodone-acetaminophen (NORCO/VICODIN) 5-325 MG tablet Take 1 tablet by mouth every 6 (six) hours as needed for moderate pain. 12/28/17  Yes Gouru, Aruna, MD  ipratropium-albuterol (DUONEB) 0.5-2.5 (3) MG/3ML SOLN Take 3 mLs by nebulization every 6 (six) hours as needed. 12/28/17  Yes Gouru, Illene Silver, MD  meclizine (ANTIVERT) 12.5 MG tablet Take 1 tablet (12.5 mg total) by mouth 3 (three) times daily as needed for dizziness. 01/27/18  Yes Fritzi Mandes, MD  metoprolol tartrate (LOPRESSOR) 25 MG tablet Take 1 tablet (25 mg total) by mouth at bedtime. 01/27/18  Yes Fritzi Mandes, MD  OS-CAL CALCIUM + D3 500-200 MG-UNIT TABS Take 1 tablet by mouth 2 (two) times daily. 05/05/18  Yes [provider]  pantoprazole (PROTONIX) 40 MG tablet Take 40 mg by mouth daily.   Yes [provider]  senna-docusate (SENOKOT-S) 8.6-50 MG tablet Take 1 tablet by mouth at bedtime as needed for mild constipation. 12/28/17  Yes Gouru, Illene Silver, MD  tiotropium (SPIRIVA HANDIHALER) 18 MCG inhalation capsule Place 1 capsule (18 mcg total) into inhaler and inhale every morning. 09/01/15  Yes Aldean Jewett, MD  traZODone (DESYREL) 50 MG tablet Take 50-100 mg by mouth at bedtime as needed for sleep. 06/16/18  Yes [provider]  latanoprost (XALATAN) 0.005 % ophthalmic solution Place 1 drop into both eyes at bedtime. 05/10/16   [provider]  tiZANidine (ZANAFLEX) 2 MG tablet Take 1 tablet (2 mg total) by mouth 3 (three) times daily as needed for muscle spasms. Patient not taking: Reported on 06/23/2018 12/28/17   Nicholes Mango, MD   Ct Head Wo Contrast  Result Date: 06/23/2018 CLINICAL DATA:  84 year old female with dizziness and fall striking  head on floor. EXAM: CT HEAD WITHOUT CONTRAST TECHNIQUE: Contiguous axial images were obtained from the base of the skull through the vertex without intravenous contrast. COMPARISON:  Brain MRI 01/27/2018, 02/23/2011. FINDINGS: Brain: Small congenital pericallosal lipoma (normal variant). Cavum septum pellucidum, normal variant. Cerebral volume and ventricle size are stable since the May MRI and within normal limits for age. Patchy and confluent bilateral cerebral white matter hypodensity. No midline shift, ventriculomegaly, mass effect, evidence of mass lesion, intracranial hemorrhage or evidence of cortically based acute infarction. No cortical encephalomalacia identified. Vascular: Calcified atherosclerosis at the skull  base. No suspicious intracranial vascular hyperdensity. Skull: Intact. Sinuses/Orbits: Right mastoid effusion is chronic. Negative visible nasopharynx. The remaining visualized paranasal sinuses and mastoids are stable and well pneumatized. Other: No scalp hematoma identified. Orbits soft tissues are within normal limits. IMPRESSION: 1. No acute intracranial abnormality or acute traumatic injury identified. 2. Chronic right mastoid effusion. 3. Chronic white matter changes, most commonly due to small vessel disease. 4. Incidental pericallosal lipoma (normal variant). Electronically Signed   By: Genevie Ann M.D.   On: 06/23/2018 12:00   Mr Brain Wo Contrast  Result Date: 06/23/2018 CLINICAL DATA:  84 year old female with dizziness, fall and struck head on the floor. EXAM: MRI HEAD WITHOUT CONTRAST TECHNIQUE: Multiplanar, multiecho pulse sequences of the brain and surrounding structures were obtained without intravenous contrast. COMPARISON:  Head CT without contrast today.  Brain MRI 01/27/2018. FINDINGS: Brain: Midline small pericallosal lipoma and cavum septum pellucidum (normal variants) redemonstrated. No restricted diffusion to suggest acute infarction. No midline shift, mass effect, evidence  of mass lesion, ventriculomegaly, extra-axial collection. Cervicomedullary junction and pituitary are within normal limits. Patchy nonspecific cerebral white matter T2 and FLAIR hyperintensity is stable. There is trace layering hemorrhage in the right occipital horn (series 8, image 14) which is new. No other intraventricular or intracranial hemorrhage identified. The deep gray matter nuclei, brainstem, and cerebellum remain normal for age. Vascular: Major intracranial vascular flow voids are stable and preserved. Skull and upper cervical spine: Negative visible cervical spine. Visualized bone marrow signal is within normal limits. Sinuses/Orbits: Stable and negative. Other: Right mastoid effusion has not significantly changed. There is a small volume of retained secretions in the nasopharynx. Trace left mastoid effusion is stable. Grossly normal visible internal auditory structures. Scalp and face soft tissues appear negative. IMPRESSION: 1. Evidence of trace intraventricular hemorrhage layering in the right occipital horn, most likely posttraumatic. 2. But no other intracranial hemorrhage or acute intracranial abnormality is identified; otherwise stable noncontrast MRI appearance of the brain since May. 3. Chronic mastoid effusions, mostly on the right. Study discussed by telephone with Dr. Wells Guiles LORD on 06/23/2018 at 17:10 . Electronically Signed   By: Genevie Ann M.D.   On: 06/23/2018 17:12   Mr Hip Left Wo Contrast  Result Date: 06/23/2018 CLINICAL DATA:  Left hip pain since a fall today. EXAM: MR OF THE LEFT HIP WITHOUT CONTRAST TECHNIQUE: Multiplanar, multisequence MR imaging was performed. No intravenous contrast was administered. COMPARISON:  Radiographs dated 06/23/2018 FINDINGS: Bones: There is a subtle fracture of the left pubic body with edema extending into the left inferior pubic ramus. There are focal areas of stage I avascular necrosis of both femoral heads. There is a serpiginous area of edema  in the posteromedial aspect of the left ilium best seen on image 9 of series 14 which may represent a subcortical fracture adjacent to the left SI joint. Old compression fracture of L4 treated with vertebroplasty. Articular cartilage and labrum Articular cartilage:  Slight diffuse joint space narrowing. Labrum:  No discrete tear. Joint or bursal effusion Joint effusion:  No effusion. Bursae: No bursitis. Muscles and tendons Muscles and tendons: Prominent edema in the adductor muscles of the left hip consistent with muscle strain. Other findings Miscellaneous: Prominent hemorrhage in the left side of the pelvis with a mass effect upon the bladder. Hemorrhage extends into the presacral space and extends anterior to the bladder and across the midline. IMPRESSION: 1. Left pubic body fracture with prominent hemorrhage in the left side of the pelvis. 2.  Strains of the left abductor muscles. 3. 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 4. Probable hairline fracture of the left ilium adjacent to the left SI joint posteriorly. 5. Critical Value/emergent results were called by telephone at the time of interpretation on 06/23/2018 at 5:16 pm to Dr. Lisa Roca , who verbally acknowledged these results. Electronically Signed   By: Lorriane Shire M.D.   On: 06/23/2018 17:18   Dg Hip Unilat W Or Wo Pelvis 2-3 Views Left  Result Date: 06/23/2018 CLINICAL DATA:  84 year old female status post fall at home. Left hip pain. EXAM: DG HIP (WITH OR WITHOUT PELVIS) 2-3V LEFT COMPARISON:  CT Abdomen and Pelvis 08/27/2015. FINDINGS: Chronic aortic and iliofemoral calcified atherosclerosis. Previous lower lumbar vertebral body augmentation. The left femoral head is normally located. The proximal left femur appears intact. The proximal right femur appears grossly intact although a geographic area of right femoral head subchondral sclerosis has increased since 2016. No pelvic fracture identified. Negative visible bowel  gas pattern. IMPRESSION: 1. No acute fracture or dislocation identified about the left hip or pelvis. If occult hip fracture is suspected or if the patient is unable to weightbear, MRI is the preferred modality for further evaluation. 2. Possible chronic AVN of the right femoral head with increased subchondral sclerosis since 2016. 3. Iliofemoral and Aortic Atherosclerosis (ICD10-I70.0). Electronically Signed   By: Genevie Ann M.D.   On: 06/23/2018 11:48    Positive ROS: All other systems have been reviewed and were otherwise negative with the exception of those mentioned in the HPI and as above.  Physical Exam: General: Alert, no acute distress Cardiovascular: No pedal edema Respiratory: No cyanosis, no use of accessory musculature GI: No organomegaly, abdomen is soft and non-tender Skin: No lesions in the area of chief complaint Neurologic: Sensation intact distally Psychiatric: Patient is competent for consent with normal mood and affect Lymphatic: No axillary or cervical lymphadenopathy  MUSCULOSKELETAL: Patient is lying in bed comfortably.  She is alert and cooperative.  She has pain with movement of the left hip.  Neurovascular status good.  Orientation is normal.  She is tender over the anterior pelvis as well.  Neurovascular status is good.  The right side is normal.  Assessment: Nondisplaced left pubic rami fractures.  Took an MRI to review these.  Plan: PT with weightbearing as tolerated on the left leg with a walker. May need SNF placement. Return to clinic 2 weeks for evaluation and x-rays.    Park Breed, MD [phone]   06/24/2018 1:17 PM

## 2018-06-24 NOTE — Clinical Social Work Note (Signed)
Clinical Social Work Assessment  Patient Details  Name: Debra [name] MRN: [id number] Date of Birth: 05/18/1933  Date of referral:  06/24/18               Reason for consult:  Facility Placement                Permission sought to share information with:  Chartered certified accountant granted to share information::  Yes, Verbal Permission Granted  Name::      [place]::   [place]   Relationship::     Contact Information:     Housing/Transportation Living arrangements for the past 2 months:  [place] of Information:  Patient, Adult Children Patient Interpreter Needed:  None Criminal Activity/Legal Involvement Pertinent to Current Situation/Hospitalization:  No - Comment as needed Significant Relationships:  Adult Children Lives with:  Other (Comment)(granddaughter ) Do you feel safe going back to the place where you live?  Yes Need for family participation in patient care:  Yes (Comment)  Care giving concerns:  Patient lives in [place] with her 82 y.o granddaughter Izell Stokes.    Social Worker assessment / plan:  Clinical Social Education officer, museum (CSW) reviewed chart and noted that patient has a pelvic fracture. PT is pending. Per Ortho MD patient may need SNF placement. CSW met with patient alone at bedside to discuss D/C plan. Patient was alert and oriented X4 and was laying in the bed. CSW introduced self and explained role of CSW department. Per patient she lives with her granddaughter who is her primary caregiver and provides 24/7 supervision. CSW explained that PT will evaluate her and make a recommendation of SNF or home health. Per patient she can't move her leg because it is so painful so she requested to go to [facility]. Per patient she went to [facility] and discharged from there in May 2019. Per patient she has not been in rehab since May 2019. CSW explained to patient that her [facility] will have to approve SNF.  FL2 complete and faxed out.   CSW presented bed offers to patient and she chose [facility]. Per [facility] admissions coordinator at [facility] patient has had her 60 day wellness period so she will not have co-pays for the first 20 days. Per Magda Paganini they will not make her pay co-pays up front. Per Magda Paganini she will start [facility] SNF authorization today. CSW will send PT note to [facility] when available. Patient's daughter Tamarra was at bedside and is aware of above. CSW will continue to follow and assist as needed.     Employment status:  Disabled (Comment on whether or not currently receiving Disability), Retired Nurse, adult PT Recommendations:  Not assessed at this time Information / Referral to community resources:  [facility](SNF vs. [place] )  Patient/Family's Response to care:  Patient requested [facility].   Patient/Family's Understanding of and Emotional Response to Diagnosis, Current Treatment, and Prognosis:  Patient was very pleasant and thanked CSW for assistance.   Emotional Assessment Appearance:  Appears stated age Attitude/Demeanor/Rapport:    Affect (typically observed):  Accepting, Adaptable, Pleasant Orientation:  Oriented to Self, Oriented to Place, Oriented to  Time, Oriented to Situation Alcohol / Substance use:  Not Applicable Psych involvement (Current and /or in the community):  No (Comment)  Discharge Needs  Concerns to be addressed:  Discharge Planning Concerns Readmission within the last 30 days:  No Current discharge  risk:  Dependent with Mobility Barriers to Discharge:  Continued Medical Work up   [employer], Veronia Beets, LCSW 06/24/2018, 1:50 PM

## 2018-06-24 NOTE — Clinical Social Work Placement (Signed)
   CLINICAL SOCIAL WORK PLACEMENT  NOTE  Date:  06/24/2018  Patient Details  Name: Debra [name] MRN: [id number] Date of Birth: 04/23/1933  Clinical Social Work is seeking post-discharge placement for this patient at the [place] level of care (*CSW will initial, date and re-position this form in  chart as items are completed):  Yes   Patient/family provided with [place] Work Department's list of facilities offering this level of care within the geographic area requested by the patient (or if unable, by the patient's family).  Yes   Patient/family informed of their freedom to choose among providers that offer the needed level of care, that participate in Medicare, Medicaid or managed care program needed by the patient, have an available bed and are willing to accept the patient.  Yes   Patient/family informed of [place]'s ownership interest in [facility] and [facility], as well as of the fact that they are under no obligation to receive care at these facilities.  PASRR submitted to EDS on       PASRR number received on       Existing PASRR number confirmed on 06/24/18     FL2 transmitted to all facilities in geographic area requested by pt/family on 06/24/18     FL2 transmitted to all facilities within larger geographic area on       Patient informed that his/her managed care company has contracts with or will negotiate with certain facilities, including the following:        Yes   Patient/family informed of bed offers received.  Patient chooses bed at [facility] )     Physician recommends and patient chooses bed at      Patient to be transferred to   on  .  Patient to be transferred to facility by       Patient family notified on   of transfer.  Name of family member notified:        PHYSICIAN       Additional Comment:    _______________________________________________ Edgardo Petrenko, Veronia Beets, LCSW 06/24/2018,  1:49 PM

## 2018-06-24 NOTE — Progress Notes (Signed)
Pts BP 76/46, recheck 91/48. MD Fritzi Mandes notified. No orders received.

## 2018-06-24 NOTE — NC FL2 (Signed)
[place] LEVEL OF CARE SCREENING TOOL     IDENTIFICATION  Patient Name: Debra [name] Birthdate: 12-08-1932 Sex: female Admission Date (Current Location): 06/23/2018  [place] and [place] Number:  Engineering geologist and Address:  [facility], [address] [place], [place] [address]      Provider Number: [id number]  Attending Physician Name and Address:  Fritzi Mandes, MD  Relative Name and Phone Number:       Current Level of Care: Hospital Recommended Level of Care: [place] Prior Approval Number:    Date Approved/Denied:   PASRR Number: [id number])  Discharge Plan: SNF    Current Diagnoses: Patient Active Problem List   Diagnosis Date Noted  . Pelvic fracture ([place]) 06/23/2018  . Dizziness 01/25/2018  . Moderate recurrent major depression ([place]) 12/25/2017  . Acute adjustment disorder with anxiety 12/25/2017  . Somatic symptom disorder 12/25/2017  . Acute respiratory failure with hypoxia ([place]) 12/22/2017  . CHF (congestive heart failure) ([place]) 12/05/2016  . CAP (community acquired pneumonia) 10/05/2016  . Chronic systolic heart failure ([place]) 06/28/2016  . COPD (chronic obstructive pulmonary disease) with emphysema ([place]) 06/28/2016  . Tobacco use 06/28/2016  . Acidosis 09/14/2015  . Hypotension 09/14/2015  . Hypokalemia 09/14/2015  . COPD exacerbation ([place]) 09/01/2015  . Sepsis ([place]) 08/27/2015    Orientation RESPIRATION BLADDER Height & Weight     Self, Time, Situation, Place  O2(4 Liters Oxygen. ) Continent Weight: 169 lb (76.7 kg) Height:  5\' 1"  (154.9 cm)  BEHAVIORAL SYMPTOMS/MOOD NEUROLOGICAL BOWEL NUTRITION STATUS      Continent Diet(Diet: Heart Healthy )  AMBULATORY STATUS COMMUNICATION OF NEEDS Skin   Extensive Assist Verbally Normal                       Personal Care Assistance Level of Assistance  Bathing, Feeding, Dressing Bathing Assistance: Limited assistance Feeding  assistance: Independent Dressing Assistance: Limited assistance     Functional Limitations Info  Sight, Hearing, Speech Sight Info: Adequate Hearing Info: Adequate Speech Info: Adequate    SPECIAL CARE FACTORS FREQUENCY  PT (By licensed PT), OT (By licensed OT)     PT Frequency: (5) OT Frequency: (5)            Contractures      Additional Factors Info  Code Status, Allergies Code Status Info: (DNR ) Allergies Info: (Codeine)           Current Medications (06/24/2018):  This is the current hospital active medication list Current Facility-Administered Medications  Medication Dose Route Frequency Provider Last Rate Last Dose  . albuterol (PROVENTIL) (2.5 MG/3ML) 0.083% nebulizer solution 2.5 mg  2.5 mg Nebulization QID PRN [name], MD      . ALPRAZolam Duanne Moron) tablet 0.5 mg  0.5 mg Oral BID [name], MD   0.5 mg at 06/23/18 2127  . atorvastatin (LIPITOR) tablet 10 mg  10 mg Oral QPM [name], MD   10 mg at 06/23/18 2033  . azelastine (ASTELIN) 0.1 % nasal spray 1 spray  1 spray Each Nare Daily [name], MD   1 spray at 06/24/18 0940  . calcium-vitamin D (OSCAL WITH D) 500-200 MG-UNIT per tablet 1 tablet  1 tablet Oral BID [name], MD   1 tablet at 06/24/18 0939  . docusate sodium (COLACE) capsule 100 mg  100 mg Oral BID PRN [name], MD      . feeding supplement (ENSURE ENLIVE) (ENSURE  ENLIVE) liquid 237 mL  237 mL Oral BID BM [name], MD   237 mL at 06/24/18 0940  . FLUoxetine (PROZAC) capsule 20 mg  20 mg Oral Daily [name], MD   20 mg at 06/24/18 0940  . guaiFENesin (ROBITUSSIN) 100 MG/5ML solution 100 mg  5 mL Oral Q4H PRN [name], MD   100 mg at 06/23/18 2034  . HYDROcodone-acetaminophen (NORCO/VICODIN) 5-325 MG per tablet 1 tablet  1 tablet Oral Q6H PRN [name], MD   1 tablet at 06/24/18 0115  . Influenza vac split  quadrivalent PF (FLUZONE HIGH-DOSE) injection 0.5 mL  0.5 mL Intramuscular Tomorrow-1000 [name], MD      . ipratropium-albuterol (DUONEB) 0.5-2.5 (3) MG/3ML nebulizer solution 3 mL  3 mL Nebulization Q6H PRN [name], MD      . latanoprost (XALATAN) 0.005 % ophthalmic solution 1 drop  1 drop Both Eyes QHS [name], MD   1 drop at 06/23/18 2126  . meclizine (ANTIVERT) tablet 12.5 mg  12.5 mg Oral TID PRN [name], MD      . metoprolol tartrate (LOPRESSOR) tablet 25 mg  25 mg Oral QHS [name], MD   25 mg at 06/23/18 2125  . pantoprazole (PROTONIX) EC tablet 40 mg  40 mg Oral Daily [name], MD   40 mg at 06/24/18 0939  . senna-docusate (Senokot-S) tablet 1 tablet  1 tablet Oral QHS PRN [name], MD      . tiotropium (SPIRIVA) inhalation capsule ([facility] use ONLY) 18 mcg  18 mcg Inhalation q morning - 10a [name], MD   18 mcg at 06/24/18 0940  . tiZANidine (ZANAFLEX) tablet 2 mg  2 mg Oral TID PRN [name], MD   2 mg at 06/23/18 2131  . traZODone (DESYREL) tablet 50-100 mg  50-100 mg Oral QHS PRN [name], MD         Discharge Medications: Please see discharge summary for a list of discharge medications.  Relevant Imaging Results:  Relevant Lab Results:   Additional Information (SSN: [id number])  Jhett Fretwell, Veronia Beets, LCSW

## 2018-06-24 NOTE — Progress Notes (Signed)
PT Hold Note  Patient Details Name: Debra [name] MRN: [id number] DOB: 08-21-1933   Evaluation Hold:   Reason Eval/Treat Not Completed: Medical issues which prohibited therapy. Per RN note pts BP 76/46, recheck 91/48. Pt fell secondary to reports of dizziness and current BP is too low for safe ambulation. In addition orthopedic consult is still pending. Will hold and attempt PT evaluation once vitals have stabilized and ortho consult is complete.   Lyndel Safe Iram Astorino PT, DPT, GCS  Ronnika Collett 06/24/2018, 9:19 AM

## 2018-06-25 NOTE — Care Management Obs Status (Signed)
[place] NOTIFICATION   Patient Details  Name: Debra [name] MRN: [id number] Date of Birth: 1932-09-20   Medicare Observation Status Notification Given:  Yes    Jolly Mango, RN 06/25/2018, 2:17 PM

## 2018-06-25 NOTE — Clinical Social Work Placement (Signed)
   CLINICAL SOCIAL WORK PLACEMENT  NOTE  Date:  06/25/2018  Patient Details  Name: Debra [name] MRN: [id number] Date of Birth: 19-Apr-1933  Clinical Social Work is seeking post-discharge placement for this patient at the [place] level of care (*CSW will initial, date and re-position this form in  chart as items are completed):  Yes   Patient/family provided with [place] Work Department's list of facilities offering this level of care within the geographic area requested by the patient (or if unable, by the patient's family).  Yes   Patient/family informed of their freedom to choose among providers that offer the needed level of care, that participate in Medicare, Medicaid or managed care program needed by the patient, have an available bed and are willing to accept the patient.  Yes   Patient/family informed of [facility]'s ownership interest in [facility] and [facility], as well as of the fact that they are under no obligation to receive care at these facilities.  PASRR submitted to EDS on       PASRR number received on       Existing PASRR number confirmed on 06/24/18     FL2 transmitted to all facilities in geographic area requested by pt/family on 06/24/18     FL2 transmitted to all facilities within larger geographic area on       Patient informed that his/her managed care company has contracts with or will negotiate with certain facilities, including the following:        Yes   Patient/family informed of bed offers received.  Patient chooses bed at [facility] )     Physician recommends and patient chooses bed at      Patient to be transferred to [facility] ) on 06/25/18.  Patient to be transferred to facility by [facility] )     Patient family notified on 06/25/18 of transfer.  Name of family member notified:  (Patient's daughter Rimsha Trembley is aware of D/C today. )     PHYSICIAN        Additional Comment:    _______________________________________________ Jekhi Bolin, Veronia Beets, LCSW 06/25/2018, 3:12 PM

## 2018-06-25 NOTE — Discharge Summary (Signed)
[facility] NAME: Debra [name]    MR#:  [id number]  DATE OF BIRTH:  1932-12-22  DATE OF ADMISSION:  06/23/2018 ADMITTING PHYSICIAN: Debra Basta, MD  DATE OF DISCHARGE: 06/24/2018  PRIMARY CARE PHYSICIAN: Debra Marble, MD    ADMISSION DIAGNOSIS:  Vertigo [R42] Intracranial hemorrhage ([place]) [I62.9] Left hip pain [M25.552] Fall, initial encounter [W19.XXXA] Closed fracture of left pubis, unspecified portion of pubis, initial encounter ([place]) [S32.502A]  DISCHARGE DIAGNOSIS:  Intraventricular hemorrhage due to fall--Neurologically table Left Pubic rami/Pelvi fracture with hematoma--hgb stable COPD -chronic on 3 liter [place] oxygen SECONDARY DIAGNOSIS:   Past Medical History:  Diagnosis Date  . Asthma   . CHF (congestive heart failure) ([place])   . COPD (chronic obstructive pulmonary disease) ([place])   . Myocardial infarction [facility]     HOSPITAL COURSE:   DorothyAshbyis a84 y.o.femalewith a known history of asthma, CHF, COPD, myocardial infarction-lives with her granddaughter in a trailer home and have complaints of intermittent dizziness in the past. Pt was in the bathroom and trying to stand up felt dizzy and lost her balance and fell on her left side. Since then was hurting excessively in her left side hip and also feeling dizzy with trying to stand up every time so came to emergency room.  *Dizziness Could be cardiac arrhythmia--pt is is Sinus rthym Will monitor on telemetry and follow serial troponin. UA is negative. No signs of infection.  *Intracranial hemorrhage/Small hemorrhage possibly due to fall patient was evaluated by neurosurgery. Continue to Hold aspirin Plavix.= for 7 days and then resume it if hgb stbale  physical therapy--recommends SNF Patient neurologically intact.   *Left Pelvic fracture with hematoma  -Physical therapy and orthopedic consult with Dr Sabra Heck appreciated--  recommends physical therapy weight-bearing as tolerated. -Pain management with hydrocodone -Hold aspirin and Plavix. -Monitor hemoglobin so far stable. Transfuse as needed.  *History of coronary artery disease Hold antiplatelets currently because of hemorrhage Continue atorvastatin and metoprolol.  *COPD with chronic respiratory failure On 3 L oxygen at home. Continue nebulizer and inhalers as home.  PT to see pt and d/c planning  To [facility]  CONSULTS OBTAINED:  Treatment Team:  Debra Leys, MD  DRUG ALLERGIES:   Allergies  Allergen Reactions  . Codeine Itching and Rash    DISCHARGE MEDICATIONS:   Allergies as of 06/25/2018      Reactions   Codeine Itching, Rash      Medication List    STOP taking these medications   aspirin 81 MG chewable tablet   clopidogrel 75 MG tablet Commonly known as:  PLAVIX   tiZANidine 2 MG tablet Commonly known as:  ZANAFLEX     TAKE these medications   albuterol (2.5 MG/3ML) 0.083% nebulizer solution Commonly known as:  PROVENTIL Take 2.5 mg by nebulization 4 (four) times daily as needed for wheezing or shortness of breath.   albuterol 108 (90 Base) MCG/ACT inhaler Commonly known as:  PROVENTIL HFA;VENTOLIN HFA Inhale 1-2 puffs into the lungs every 6 (six) hours as needed for wheezing or shortness of breath.   ALPRAZolam 0.5 MG tablet Commonly known as:  XANAX Take 1 tablet (0.5 mg total) by mouth 2 (two) times daily.   atorvastatin 10 MG tablet Commonly known as:  LIPITOR Take 10 mg by mouth every evening.   azelastine 0.1 % nasal spray Commonly known as:  ASTELIN Place 1 spray into both nostrils daily.   diclofenac sodium 1 %  Gel Commonly known as:  VOLTAREN Apply 1 g topically 2 (two) times daily. Apply to knees   docusate sodium 100 MG capsule Commonly known as:  COLACE Take 1 capsule (100 mg total) by mouth daily as needed for mild constipation.   feeding supplement (ENSURE ENLIVE) Liqd Take 237  mLs by mouth 2 (two) times daily between meals.   FLUoxetine 20 MG capsule Commonly known as:  PROZAC Take 1 capsule (20 mg total) by mouth daily.   guaiFENesin 100 MG/5ML Soln Commonly known as:  ROBITUSSIN Take 5 mLs (100 mg total) by mouth every 4 (four) hours as needed for cough or to loosen phlegm.   HYDROcodone-acetaminophen 5-325 MG tablet Commonly known as:  NORCO/VICODIN Take 1 tablet by mouth every 6 (six) hours as needed for moderate pain.   ipratropium-albuterol 0.5-2.5 (3) MG/3ML Soln Commonly known as:  DUONEB Take 3 mLs by nebulization every 6 (six) hours as needed.   latanoprost 0.005 % ophthalmic solution Commonly known as:  XALATAN Place 1 drop into both eyes at bedtime.   meclizine 12.5 MG tablet Commonly known as:  ANTIVERT Take 1 tablet (12.5 mg total) by mouth 3 (three) times daily as needed for dizziness.   metoprolol tartrate 25 MG tablet Commonly known as:  LOPRESSOR Take 1 tablet (25 mg total) by mouth at bedtime.   OS-CAL CALCIUM + D3 500-200 MG-UNIT Tabs Generic drug:  Calcium Carb-Cholecalciferol Take 1 tablet by mouth 2 (two) times daily.   pantoprazole 40 MG tablet Commonly known as:  PROTONIX Take 40 mg by mouth daily.   senna-docusate 8.6-50 MG tablet Commonly known as:  Senokot-S Take 1 tablet by mouth at bedtime as needed for mild constipation.   tiotropium 18 MCG inhalation capsule Commonly known as:  SPIRIVA Place 1 capsule (18 mcg total) into inhaler and inhale every morning.   traZODone 50 MG tablet Commonly known as:  DESYREL Take 50-100 mg by mouth at bedtime as needed for sleep.       If you experience worsening of your admission symptoms, develop shortness of breath, life threatening emergency, suicidal or homicidal thoughts you must seek medical attention immediately by calling 911 or calling your MD immediately  if symptoms less severe.  You Must read complete instructions/literature along with all the possible  adverse reactions/side effects for all the Medicines you take and that have been prescribed to you. Take any new Medicines after you have completely understood and accept all the possible adverse reactions/side effects.   Please note  You were cared for by a hospitalist during your hospital stay. If you have any questions about your discharge medications or the care you received while you were in the hospital after you are discharged, you can call the unit and asked to speak with the hospitalist on call if the hospitalist that took care of you is not available. Once you are discharged, your primary care physician will handle any further medical issues. Please note that NO REFILLS for any discharge medications will be authorized once you are discharged, as it is imperative that you return to your primary care physician (or establish a relationship with a primary care physician if you do not have one) for your aftercare needs so that they can reassess your need for medications and monitor your lab values.   DATA REVIEW:   CBC  Recent Labs  Lab 06/24/18 0217  WBC 8.9  HGB 8.7*  HCT 27.3*  PLT 185    Chemistries  Recent  Labs  Lab 06/24/18 0217  NA 140  K 4.2  CL 105  CO2 30  GLUCOSE 110*  BUN 16  CREATININE 1.23*  CALCIUM 8.3*    Microbiology Results   No results found for this or any previous visit (from the past 240 hour(s)).  RADIOLOGY:  Mr Brain 75 Contrast  Result Date: 06/23/2018 CLINICAL DATA:  84 year old female with dizziness, fall and struck head on the floor. EXAM: MRI HEAD WITHOUT CONTRAST TECHNIQUE: Multiplanar, multiecho pulse sequences of the brain and surrounding structures were obtained without intravenous contrast. COMPARISON:  Head CT without contrast today.  Brain MRI 01/27/2018. FINDINGS: Brain: Midline small pericallosal lipoma and cavum septum pellucidum (normal variants) redemonstrated. No restricted diffusion to suggest acute infarction. No midline shift,  mass effect, evidence of mass lesion, ventriculomegaly, extra-axial collection. Cervicomedullary junction and pituitary are within normal limits. Patchy nonspecific cerebral white matter T2 and FLAIR hyperintensity is stable. There is trace layering hemorrhage in the right occipital horn (series 8, image 14) which is new. No other intraventricular or intracranial hemorrhage identified. The deep gray matter nuclei, brainstem, and cerebellum remain normal for age. Vascular: Major intracranial vascular flow voids are stable and preserved. Skull and upper cervical spine: Negative visible cervical spine. Visualized bone marrow signal is within normal limits. Sinuses/Orbits: Stable and negative. Other: Right mastoid effusion has not significantly changed. There is a small volume of retained secretions in the nasopharynx. Trace left mastoid effusion is stable. Grossly normal visible internal auditory structures. Scalp and face soft tissues appear negative. IMPRESSION: 1. Evidence of trace intraventricular hemorrhage layering in the right occipital horn, most likely posttraumatic. 2. But no other intracranial hemorrhage or acute intracranial abnormality is identified; otherwise stable noncontrast MRI appearance of the brain since May. 3. Chronic mastoid effusions, mostly on the right. Study discussed by telephone with Dr. Wells Guiles LORD on 06/23/2018 at 17:10 . Electronically Signed   By: Genevie Ann M.D.   On: 06/23/2018 17:12   Mr Hip Left Wo Contrast  Result Date: 06/23/2018 CLINICAL DATA:  Left hip pain since a fall today. EXAM: MR OF THE LEFT HIP WITHOUT CONTRAST TECHNIQUE: Multiplanar, multisequence MR imaging was performed. No intravenous contrast was administered. COMPARISON:  Radiographs dated 06/23/2018 FINDINGS: Bones: There is a subtle fracture of the left pubic body with edema extending into the left inferior pubic ramus. There are focal areas of stage I avascular necrosis of both femoral heads. There is a  serpiginous area of edema in the posteromedial aspect of the left ilium best seen on image 9 of series 14 which may represent a subcortical fracture adjacent to the left SI joint. Old compression fracture of L4 treated with vertebroplasty. Articular cartilage and labrum Articular cartilage:  Slight diffuse joint space narrowing. Labrum:  No discrete tear. Joint or bursal effusion Joint effusion:  No effusion. Bursae: No bursitis. Muscles and tendons Muscles and tendons: Prominent edema in the adductor muscles of the left hip consistent with muscle strain. Other findings Miscellaneous: Prominent hemorrhage in the left side of the pelvis with a mass effect upon the bladder. Hemorrhage extends into the presacral space and extends anterior to the bladder and across the midline. IMPRESSION: 1. Left pubic body fracture with prominent hemorrhage in the left side of the pelvis. 2. Strains of the left abductor muscles. 3. 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 4. Probable hairline fracture of the left ilium adjacent to the left SI joint posteriorly. 5. Critical Value/emergent results were called by  telephone at the time of interpretation on 06/23/2018 at 5:16 pm to Dr. Lisa Roca , who verbally acknowledged these results. Electronically Signed   By: Lorriane Shire M.D.   On: 06/23/2018 17:18     Management plans discussed with the patient, family and they are in agreement.  CODE STATUS:     Code Status Orders  (From admission, onward)         Start     Ordered   06/23/18 1847  Do not attempt resuscitation (DNR)  Continuous    Question Answer Comment  In the event of cardiac or respiratory ARREST Do not call a "code blue"   In the event of cardiac or respiratory ARREST Do not perform Intubation, CPR, defibrillation or ACLS   In the event of cardiac or respiratory ARREST Use medication by any route, position, wound care, and other measures to relive pain and suffering. May use oxygen,  suction and manual treatment of airway obstruction as needed for comfort.      06/23/18 1846        Code Status History    Date Active Date Inactive Code Status Order ID Comments User Context   01/25/2018 1810 01/27/2018 1940 DNR [id number]  Debra Basta, MD Inpatient   12/22/2017 1631 12/28/2017 2218 DNR [id number]  Demetrios Loll, MD Inpatient   12/05/2016 1102 12/08/2016 1724 DNR [id number]  Hillary Bow, MD ED   10/05/2016 2302 10/10/2016 2354 Full Code [id number]  Lance Coon, MD Inpatient   06/10/2016 1032 06/10/2016 1050 Full Code [id number]  Theodoro Grist, MD Inpatient   09/14/2015 2019 09/16/2015 2034 Full Code [id number]  Theodoro Grist, MD Inpatient   08/27/2015 0805 09/01/2015 2042 Full Code [id number]  Harrie Foreman, MD Inpatient      TOTAL TIME TAKING CARE OF THIS PATIENT: 40 minutes.    Fritzi Mandes M.D on 06/25/2018 at 2:12 PM  Between 7am to 6pm - Pager - [phone] After 6pm go to [email or web address] - password [email or web address]  [facility]  Office  [phone]  CC: Primary care physician; Debra Marble, MD

## 2018-06-25 NOTE — Evaluation (Signed)
Physical Therapy Evaluation Patient Details Name: Debra [name] MRN: [id number] DOB: 11/02/1932 Today's Date: 06/25/2018   History of Present Illness  Pt admitted for L nondisplaced pelvic fx. Nonsurgical approach. Of note, small hemorrhage in ventricle however deemed medically stable. HIstory includes asthma, CHF, COPD (3L of O2 chronic), and MI. Pt complains of multiple falls at home.   Clinical Impression  Pt is a pleasant 84 year old female who was admitted for L pelvic fracture. Pt performs bed mobility with max assist +2 and unable to perform further transfers/ambulation. Pt demonstrates deficits with strength/mobility/pain. Once seated at EOB, pt in severe pain and requested to return back to bed. RN notified and will give pain meds as able. Pt motivated to participate in therapy, however is currently not at baseline level at this time. Would benefit from skilled PT to address above deficits and promote optimal return to PLOF; recommend transition to STR upon discharge from acute hospitalization.       Follow Up Recommendations SNF    Equipment Recommendations  None recommended by PT    Recommendations for Other Services       Precautions / Restrictions Precautions Precautions: Fall Restrictions Weight Bearing Restrictions: Yes LLE Weight Bearing: Weight bearing as tolerated      Mobility  Bed Mobility Overal bed mobility: Needs Assistance Bed Mobility: Supine to Sit     Supine to sit: Max assist;+2 for safety/equipment;+2 for physical assistance     General bed mobility comments: attempted bed mobility with +1, however unable to fully transfer. +2 assisted for trunk mobility and B LEs. Once transferred up to side of bed, able to maintain seated balance x 1-2 minutes. Pt then complains of intense pain and nausea, requested to return back to bed. +2 for transfer.  Transfers                 General transfer comment: unable to perform  Ambulation/Gait              General Gait Details: unable to perform  Stairs            Wheelchair Mobility    Modified Rankin (Stroke Patients Only)       Balance Overall balance assessment: Needs assistance;History of Falls Sitting-balance support: Feet supported;Bilateral upper extremity supported Sitting balance-Leahy Scale: Poor Sitting balance - Comments: post lean noted.                                     Pertinent Vitals/Pain Pain Assessment: 0-10 Pain Score: 10-Worst pain ever Pain Location: L LE with movement Pain Descriptors / Indicators: Aching;Dull;Discomfort Pain Intervention(s): Limited activity within patient's tolerance;Premedicated before session    Home Living Family/patient expects to be discharged to:: Private residence Living Arrangements: Children(grandaughter) Available Help at Discharge: Family;Available 24 hours/day Type of Home: House Home Access: Ramped entrance     Home Layout: One level Home Equipment: Walker - 4 wheels;Walker - 2 wheels;Bedside commode      Prior Function Level of Independence: Needs assistance   Gait / Transfers Assistance Needed: reports she has been ambulating short distances using rollater.           Hand Dominance        Extremity/Trunk Assessment   Upper Extremity Assessment Upper Extremity Assessment: Generalized weakness(B UE grossly 3/5)    Lower Extremity Assessment Lower Extremity Assessment: Generalized weakness(L LE grossly 2/5; limited by pain R  LE 3/5)       Communication   Communication: No difficulties  Cognition Arousal/Alertness: Awake/alert Behavior During Therapy: WFL for tasks assessed/performed Overall Cognitive Status: Within Functional Limits for tasks assessed                                        General Comments      Exercises Other Exercises Other Exercises: supine ther-ex performed on B LE including SLR, hip abd/add, and ankle pumps. All  ther-ex performed x 10 reps with mod assist on L LE and min assist on R LE. Very limited by pain.   Assessment/Plan    PT Assessment Patient needs continued PT services  PT Problem List Decreased strength;Decreased balance;Decreased mobility;Decreased activity tolerance;Pain       PT Treatment Interventions DME instruction;Gait training;Therapeutic exercise;Balance training    PT Goals (Current goals can be found in the Care Plan section)  Acute Rehab PT Goals Patient Stated Goal: to go to rehab PT Goal Formulation: With patient Time For Goal Achievement: 07/09/18 Potential to Achieve Goals: Good    Frequency 7X/week   Barriers to discharge        Co-evaluation               AM-PAC PT "6 Clicks" Daily Activity  Outcome Measure Difficulty turning over in bed (including adjusting bedclothes, sheets and blankets)?: Unable Difficulty moving from lying on back to sitting on the side of the bed? : Unable Difficulty sitting down on and standing up from a chair with arms (e.g., wheelchair, bedside commode, etc,.)?: Unable Help needed moving to and from a bed to chair (including a wheelchair)?: Total Help needed walking in hospital room?: Total Help needed climbing 3-5 steps with a railing? : Total 6 Click Score: 6    End of Session Equipment Utilized During Treatment: Gait belt;Oxygen Activity Tolerance: Patient limited by pain Patient left: in bed;with bed alarm set Nurse Communication: Mobility status PT Visit Diagnosis: Unsteadiness on feet (R26.81);Muscle weakness (generalized) (M62.81);History of falling (Z91.81);Difficulty in walking, not elsewhere classified (R26.2);Pain Pain - Right/Left: Left Pain - part of body: Leg    Time: 7253-6644 PT Time Calculation (min) (ACUTE ONLY): 31 min   Charges:   PT Evaluation $PT Eval Moderate Complexity: 1 Mod PT Treatments $Therapeutic Exercise: 8-22 mins        Greggory Stallion, PT,  DPT [phone]   Debra [name] 06/25/2018, 11:45 AM

## 2018-06-25 NOTE — Progress Notes (Signed)
Pt. Discharged to [facility] via EMS. Discharge instructions and medication regimen reviewed with LC. Prescriptions in packet with EMS Patient assessment unchanged from this morning. TELE and IV discontinued per policy.

## 2018-06-25 NOTE — Progress Notes (Signed)
[facility] [place] NAME: Debra [name]    MR#:  [id number]  DATE OF BIRTH:  October 10, 1932  SUBJECTIVE:  came in after she had all fall at home. She is complaining of pain on her left hip.  Patient states she is able to move her left hip better today. She had good breakfast.  REVIEW OF SYSTEMS:   Review of Systems  Constitutional: Negative for chills, fever and weight loss.  HENT: Negative for ear discharge, ear pain and nosebleeds.   Eyes: Negative for blurred vision, pain and discharge.  Respiratory: Negative for sputum production, shortness of breath, wheezing and stridor.   Cardiovascular: Negative for chest pain, palpitations, orthopnea and PND.  Gastrointestinal: Negative for abdominal pain, diarrhea, nausea and vomiting.  Genitourinary: Negative for frequency and urgency.  Musculoskeletal: Negative for back pain and joint pain.  Neurological: Negative for sensory change, speech change, focal weakness and weakness.  Psychiatric/Behavioral: Negative for depression and hallucinations. The patient is not nervous/anxious.    Tolerating Diet:yes Tolerating PT: pending  DRUG ALLERGIES:   Allergies  Allergen Reactions  . Codeine Itching and Rash    VITALS:  Blood pressure (!) 106/52, pulse 73, temperature 98 F (36.7 C), temperature source Oral, resp. rate 20, height 5\' 1"  (1.549 m), weight 76.7 kg, SpO2 (!) 89 %.  PHYSICAL EXAMINATION:   Physical Exam  GENERAL:  84 y.o.-year-old patient lying in the bed with no acute distress.  EYES: Pupils equal, round, reactive to light and accommodation. No scleral icterus. Extraocular muscles intact.  HEENT: Head atraumatic, normocephalic. Oropharynx and nasopharynx clear.  NECK:  Supple, no jugular venous distention. No thyroid enlargement, no tenderness.  LUNGS:coarse breath sounds bilaterally, no wheezing, rales, rhonchi. No use of accessory muscles of respiration.  CARDIOVASCULAR:  S1, S2 normal. No murmurs, rubs, or gallops.  ABDOMEN: Soft, nontender, nondistended. Bowel sounds present. No organomegaly or mass.  EXTREMITIES: No cyanosis, clubbing or edema b/l.   No bruise noted on the hip. Decreased range of motion left hip. NEUROLOGIC: Cranial nerves II through XII are intact. No focal Motor or sensory deficits b/l.   PSYCHIATRIC:  patient is alert and oriented x 3.  SKIN: No obvious rash, lesion, or ulcer.   LABORATORY PANEL:  CBC Recent Labs  Lab 06/24/18 0217  WBC 8.9  HGB 8.7*  HCT 27.3*  PLT 185    Chemistries  Recent Labs  Lab 06/24/18 0217  NA 140  K 4.2  CL 105  CO2 30  GLUCOSE 110*  BUN 16  CREATININE 1.23*  CALCIUM 8.3*   Cardiac Enzymes Recent Labs  Lab 06/24/18 0217  TROPONINI <0.03   RADIOLOGY:  Ct Head Wo Contrast  Result Date: 06/23/2018 CLINICAL DATA:  84 year old female with dizziness and fall striking head on floor. EXAM: CT HEAD WITHOUT CONTRAST TECHNIQUE: Contiguous axial images were obtained from the base of the skull through the vertex without intravenous contrast. COMPARISON:  Brain MRI 01/27/2018, 02/23/2011. FINDINGS: Brain: Small congenital pericallosal lipoma (normal variant). Cavum septum pellucidum, normal variant. Cerebral volume and ventricle size are stable since the May MRI and within normal limits for age. Patchy and confluent bilateral cerebral white matter hypodensity. No midline shift, ventriculomegaly, mass effect, evidence of mass lesion, intracranial hemorrhage or evidence of cortically based acute infarction. No cortical encephalomalacia identified. Vascular: Calcified atherosclerosis at the skull base. No suspicious intracranial vascular hyperdensity. Skull: Intact. Sinuses/Orbits: Right mastoid effusion is chronic. Negative visible nasopharynx. The remaining visualized  paranasal sinuses and mastoids are stable and well pneumatized. Other: No scalp hematoma identified. Orbits soft tissues are within normal  limits. IMPRESSION: 1. No acute intracranial abnormality or acute traumatic injury identified. 2. Chronic right mastoid effusion. 3. Chronic white matter changes, most commonly due to small vessel disease. 4. Incidental pericallosal lipoma (normal variant). Electronically Signed   By: Genevie Ann M.D.   On: 06/23/2018 12:00   Mr Brain Wo Contrast  Result Date: 06/23/2018 CLINICAL DATA:  84 year old female with dizziness, fall and struck head on the floor. EXAM: MRI HEAD WITHOUT CONTRAST TECHNIQUE: Multiplanar, multiecho pulse sequences of the brain and surrounding structures were obtained without intravenous contrast. COMPARISON:  Head CT without contrast today.  Brain MRI 01/27/2018. FINDINGS: Brain: Midline small pericallosal lipoma and cavum septum pellucidum (normal variants) redemonstrated. No restricted diffusion to suggest acute infarction. No midline shift, mass effect, evidence of mass lesion, ventriculomegaly, extra-axial collection. Cervicomedullary junction and pituitary are within normal limits. Patchy nonspecific cerebral white matter T2 and FLAIR hyperintensity is stable. There is trace layering hemorrhage in the right occipital horn (series 8, image 14) which is new. No other intraventricular or intracranial hemorrhage identified. The deep gray matter nuclei, brainstem, and cerebellum remain normal for age. Vascular: Major intracranial vascular flow voids are stable and preserved. Skull and upper cervical spine: Negative visible cervical spine. Visualized bone marrow signal is within normal limits. Sinuses/Orbits: Stable and negative. Other: Right mastoid effusion has not significantly changed. There is a small volume of retained secretions in the nasopharynx. Trace left mastoid effusion is stable. Grossly normal visible internal auditory structures. Scalp and face soft tissues appear negative. IMPRESSION: 1. Evidence of trace intraventricular hemorrhage layering in the right occipital horn, most  likely posttraumatic. 2. But no other intracranial hemorrhage or acute intracranial abnormality is identified; otherwise stable noncontrast MRI appearance of the brain since May. 3. Chronic mastoid effusions, mostly on the right. Study discussed by telephone with Dr. Wells Guiles LORD on 06/23/2018 at 17:10 . Electronically Signed   By: Genevie Ann M.D.   On: 06/23/2018 17:12   Mr Hip Left Wo Contrast  Result Date: 06/23/2018 CLINICAL DATA:  Left hip pain since a fall today. EXAM: MR OF THE LEFT HIP WITHOUT CONTRAST TECHNIQUE: Multiplanar, multisequence MR imaging was performed. No intravenous contrast was administered. COMPARISON:  Radiographs dated 06/23/2018 FINDINGS: Bones: There is a subtle fracture of the left pubic body with edema extending into the left inferior pubic ramus. There are focal areas of stage I avascular necrosis of both femoral heads. There is a serpiginous area of edema in the posteromedial aspect of the left ilium best seen on image 9 of series 14 which may represent a subcortical fracture adjacent to the left SI joint. Old compression fracture of L4 treated with vertebroplasty. Articular cartilage and labrum Articular cartilage:  Slight diffuse joint space narrowing. Labrum:  No discrete tear. Joint or bursal effusion Joint effusion:  No effusion. Bursae: No bursitis. Muscles and tendons Muscles and tendons: Prominent edema in the adductor muscles of the left hip consistent with muscle strain. Other findings Miscellaneous: Prominent hemorrhage in the left side of the pelvis with a mass effect upon the bladder. Hemorrhage extends into the presacral space and extends anterior to the bladder and across the midline. IMPRESSION: 1. Left pubic body fracture with prominent hemorrhage in the left side of the pelvis. 2. Strains of the left abductor muscles. 3. 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 4. Probable hairline fracture  of the left ilium adjacent to the left SI joint  posteriorly. 5. Critical Value/emergent results were called by telephone at the time of interpretation on 06/23/2018 at 5:16 pm to Dr. Lisa Roca , who verbally acknowledged these results. Electronically Signed   By: Lorriane Shire M.D.   On: 06/23/2018 17:18   Dg Hip Unilat W Or Wo Pelvis 2-3 Views Left  Result Date: 06/23/2018 CLINICAL DATA:  84 year old female status post fall at home. Left hip pain. EXAM: DG HIP (WITH OR WITHOUT PELVIS) 2-3V LEFT COMPARISON:  CT Abdomen and Pelvis 08/27/2015. FINDINGS: Chronic aortic and iliofemoral calcified atherosclerosis. Previous lower lumbar vertebral body augmentation. The left femoral head is normally located. The proximal left femur appears intact. The proximal right femur appears grossly intact although a geographic area of right femoral head subchondral sclerosis has increased since 2016. No pelvic fracture identified. Negative visible bowel gas pattern. IMPRESSION: 1. No acute fracture or dislocation identified about the left hip or pelvis. If occult hip fracture is suspected or if the patient is unable to weightbear, MRI is the preferred modality for further evaluation. 2. Possible chronic AVN of the right femoral head with increased subchondral sclerosis since 2016. 3. Iliofemoral and Aortic Atherosclerosis (ICD10-I70.0). Electronically Signed   By: Genevie Ann M.D.   On: 06/23/2018 11:48   ASSESSMENT AND PLAN:    Katilyn Miltenberger  is a 84 y.o. female with a known history of asthma, CHF, COPD, myocardial infarction-lives with her granddaughter in a trailer home and have complaints of intermittent dizziness in the past. Today was in the bathroom and trying to stand up felt dizzy and lost her balance and fell on her left side.  Since then was hurting excessively in her left side hip and also feeling dizzy with trying to stand up every time so came to emergency room.  *Dizziness Could be cardiac arrhythmia--pt is is Sinus rthym Will monitor on telemetry and  follow serial troponin. UA is negative. No signs of infection.  *Intracranial hemorrhage/Small hemorrhage possibly due to fall patient was evaluated by neurosurgery. Continue to monitor. Hold aspirin Plavix. Start physical therapy. Patient neurologically intact.  *Left Pelvic fracture with hematoma  -Physical therapy and orthopedic consult with Dr Sabra Heck appreciated-- recommends physical therapy weight-bearing as tolerated. -Pain management with hydrocodone -Hold aspirin and Plavix. -Monitor hemoglobin so far stable. Transfuse as needed.  *History of coronary artery disease Hold antiplatelets currently because of hemorrhage Continue atorvastatin and metoprolol.  *COPD with chronic respiratory failure On 3 L oxygen at home. Continue nebulizer and inhalers as home.  PT to see pt and d/c planning thereafter  Case discussed with Care Management/Social Worker. Management plans discussed with the patient, family and they are in agreement.  CODE STATUS: DNR  DVT Prophylaxis: heparin SQ  TOTAL TIME TAKING CARE OF THIS PATIENT: 30 minutes.  >50% time spent on counselling and coordination of care  POSSIBLE D/C IN 2-3* DAYS, DEPENDING ON CLINICAL CONDITION.  Note: This dictation was prepared with Dragon dictation along with smaller phrase technology. Any transcriptional errors that result from this process are unintentional.  Fritzi Mandes M.D on 06/25/2018 at 9:08 AM  Between 7am to 6pm - Pager - [phone]  After 6pm go to [email or web address] - password [email or web address] [facility]  Office  [phone]  CC: Primary care physician; Jodi Marble, MDPatient ID: [name], female   DOB: 10/03/1932, 84 y.o.   MRN: [id number]

## 2018-06-25 NOTE — Progress Notes (Signed)
Clinical Education officer, museum (CSW) sent PT note to [facility] today. Per [facility] admissions coordinator at [facility] authorization is still pending.   [employer], LCSW [phone]

## 2018-06-25 NOTE — Care Management (Signed)
RNCM attempted to meet with patient but PT was attempting to work with her. She is receiving supplemental O2 through a face mask currently.

## 2018-06-25 NOTE — Progress Notes (Signed)
Patient is medically stable for D/C to [facility] today. Per [facility] admissions coordinator at [facility] authorization has been received and patient can come today to room 401. RN will call report and arrange EMS for transport. Clinical Education officer, museum (CSW) sent D/C orders to [facility] via [facility]. Patient is aware of above. CSW contacted patient's daughter Fortune Torosian and made her aware of above. Please reconsult if future social work needs arise. CSW signing off.   [employer], LCSW [phone]

## 2018-08-12 ENCOUNTER — Encounter: Payer: Self-pay | Admitting: Emergency Medicine

## 2018-08-12 ENCOUNTER — Emergency Department: Payer: Medicare Other

## 2018-08-12 ENCOUNTER — Inpatient Hospital Stay
Admission: EM | Admit: 2018-08-12 | Discharge: 2018-08-16 | DRG: 180 | Disposition: A | Discharge status: Hospice | Payer: Medicare Other | Attending: Internal Medicine | Admitting: Internal Medicine

## 2018-08-12 ENCOUNTER — Other Ambulatory Visit: Payer: Self-pay

## 2018-08-12 DIAGNOSIS — J441 Chronic obstructive pulmonary disease with (acute) exacerbation: Secondary | ICD-10-CM | POA: Diagnosis present

## 2018-08-12 DIAGNOSIS — I5032 Chronic diastolic (congestive) heart failure: Secondary | ICD-10-CM | POA: Diagnosis present

## 2018-08-12 DIAGNOSIS — Z66 Do not resuscitate: Secondary | ICD-10-CM | POA: Diagnosis present

## 2018-08-12 DIAGNOSIS — Z9981 Dependence on supplemental oxygen: Secondary | ICD-10-CM | POA: Diagnosis not present

## 2018-08-12 DIAGNOSIS — Z6832 Body mass index (BMI) 32.0-32.9, adult: Secondary | ICD-10-CM | POA: Diagnosis not present

## 2018-08-12 DIAGNOSIS — D649 Anemia, unspecified: Secondary | ICD-10-CM | POA: Diagnosis present

## 2018-08-12 DIAGNOSIS — Z951 Presence of aortocoronary bypass graft: Secondary | ICD-10-CM

## 2018-08-12 DIAGNOSIS — J189 Pneumonia, unspecified organism: Secondary | ICD-10-CM | POA: Diagnosis present

## 2018-08-12 DIAGNOSIS — Z515 Encounter for palliative care: Secondary | ICD-10-CM | POA: Diagnosis not present

## 2018-08-12 DIAGNOSIS — Z7902 Long term (current) use of antithrombotics/antiplatelets: Secondary | ICD-10-CM | POA: Diagnosis not present

## 2018-08-12 DIAGNOSIS — J440 Chronic obstructive pulmonary disease with acute lower respiratory infection: Secondary | ICD-10-CM | POA: Diagnosis present

## 2018-08-12 DIAGNOSIS — Z791 Long term (current) use of non-steroidal anti-inflammatories (NSAID): Secondary | ICD-10-CM | POA: Diagnosis not present

## 2018-08-12 DIAGNOSIS — Z90710 Acquired absence of both cervix and uterus: Secondary | ICD-10-CM

## 2018-08-12 DIAGNOSIS — Z9089 Acquired absence of other organs: Secondary | ICD-10-CM | POA: Diagnosis not present

## 2018-08-12 DIAGNOSIS — R0602 Shortness of breath: Secondary | ICD-10-CM | POA: Diagnosis not present

## 2018-08-12 DIAGNOSIS — J188 Other pneumonia, unspecified organism: Secondary | ICD-10-CM | POA: Diagnosis not present

## 2018-08-12 DIAGNOSIS — Z79899 Other long term (current) drug therapy: Secondary | ICD-10-CM | POA: Diagnosis not present

## 2018-08-12 DIAGNOSIS — Z8619 Personal history of other infectious and parasitic diseases: Secondary | ICD-10-CM | POA: Diagnosis not present

## 2018-08-12 DIAGNOSIS — R918 Other nonspecific abnormal finding of lung field: Secondary | ICD-10-CM | POA: Diagnosis not present

## 2018-08-12 DIAGNOSIS — F17211 Nicotine dependence, cigarettes, in remission: Secondary | ICD-10-CM | POA: Diagnosis present

## 2018-08-12 DIAGNOSIS — C3431 Malignant neoplasm of lower lobe, right bronchus or lung: Principal | ICD-10-CM | POA: Diagnosis present

## 2018-08-12 DIAGNOSIS — J9621 Acute and chronic respiratory failure with hypoxia: Secondary | ICD-10-CM | POA: Diagnosis not present

## 2018-08-12 DIAGNOSIS — I2510 Atherosclerotic heart disease of native coronary artery without angina pectoris: Secondary | ICD-10-CM | POA: Diagnosis present

## 2018-08-12 DIAGNOSIS — I252 Old myocardial infarction: Secondary | ICD-10-CM | POA: Diagnosis not present

## 2018-08-12 DIAGNOSIS — E785 Hyperlipidemia, unspecified: Secondary | ICD-10-CM | POA: Diagnosis present

## 2018-08-12 DIAGNOSIS — E669 Obesity, unspecified: Secondary | ICD-10-CM | POA: Diagnosis present

## 2018-08-12 DIAGNOSIS — J9610 Chronic respiratory failure, unspecified whether with hypoxia or hypercapnia: Secondary | ICD-10-CM | POA: Diagnosis not present

## 2018-08-12 DIAGNOSIS — Z885 Allergy status to narcotic agent status: Secondary | ICD-10-CM

## 2018-08-12 DIAGNOSIS — Z8249 Family history of ischemic heart disease and other diseases of the circulatory system: Secondary | ICD-10-CM | POA: Diagnosis not present

## 2018-08-12 LAB — CBC
HCT: 33.9 % — ABNORMAL LOW (ref 36.0–46.0)
HEMOGLOBIN: 10.5 g/dL — AB (ref 12.0–15.0)
MCH: 32.6 pg (ref 26.0–34.0)
MCHC: 31 g/dL (ref 30.0–36.0)
MCV: 105.3 fL — ABNORMAL HIGH (ref 80.0–100.0)
Platelets: 236 10*3/uL (ref 150–400)
RBC: 3.22 MIL/uL — ABNORMAL LOW (ref 3.87–5.11)
RDW: 15 % (ref 11.5–15.5)
WBC: 8.5 10*3/uL (ref 4.0–10.5)
nRBC: 0 % (ref 0.0–0.2)

## 2018-08-12 LAB — BASIC METABOLIC PANEL
Anion gap: 9 (ref 5–15)
BUN: 14 mg/dL (ref 8–23)
CO2: 33 mmol/L — ABNORMAL HIGH (ref 22–32)
Calcium: 9.3 mg/dL (ref 8.9–10.3)
Chloride: 98 mmol/L (ref 98–111)
Creatinine, Ser: 1.06 mg/dL — ABNORMAL HIGH (ref 0.44–1.00)
GFR calc Af Amer: 56 mL/min — ABNORMAL LOW (ref >60)
GFR calc non Af Amer: 49 mL/min — ABNORMAL LOW (ref >60)
GLUCOSE: 117 mg/dL — AB (ref 70–99)
Potassium: 4.3 mmol/L (ref 3.5–5.1)
Sodium: 140 mmol/L (ref 135–145)

## 2018-08-12 LAB — PROTIME-INR
INR: 1.07
Prothrombin Time: 13.8 seconds (ref 11.4–15.2)

## 2018-08-12 LAB — TROPONIN I: Troponin I: <0.03 ng/mL (ref <0.03)

## 2018-08-12 MED ORDER — SODIUM CHLORIDE 0.9 % IV SOLN
2.0000 g | Freq: Two times a day (BID) | INTRAVENOUS | Status: DC
  Administered 2018-08-13: 2 g via INTRAVENOUS
  Filled 2018-08-12: qty 2

## 2018-08-12 MED ORDER — VANCOMYCIN HCL IN DEXTROSE 1-5 GM/200ML-% IV SOLN
1000.0000 mg | Freq: Once | INTRAVENOUS | Status: AC
  Administered 2018-08-12: 1000 mg via INTRAVENOUS
  Filled 2018-08-12: qty 200

## 2018-08-12 MED ORDER — SODIUM CHLORIDE 0.9 % IV SOLN
2.0000 g | Freq: Once | INTRAVENOUS | Status: AC
  Administered 2018-08-12: 2 g via INTRAVENOUS
  Filled 2018-08-12: qty 2

## 2018-08-12 MED ORDER — ONDANSETRON HCL 4 MG PO TABS: 4.0000 mg | ORAL_TABLET | Freq: Four times a day (QID) | ORAL | Status: DC | PRN

## 2018-08-12 MED ORDER — SODIUM CHLORIDE 0.9% FLUSH: 3.0000 mL | INTRAVENOUS | Status: DC | PRN

## 2018-08-12 MED ORDER — VANCOMYCIN HCL IN DEXTROSE 1-5 GM/200ML-% IV SOLN
1000.0000 mg | INTRAVENOUS | Status: DC
  Administered 2018-08-12: 1000 mg via INTRAVENOUS
  Filled 2018-08-12 (×2): qty 200

## 2018-08-12 MED ORDER — ACETAMINOPHEN 650 MG RE SUPP: 650.0000 mg | Freq: Four times a day (QID) | RECTAL | Status: DC | PRN

## 2018-08-12 MED ORDER — HYDROCODONE-ACETAMINOPHEN 5-325 MG PO TABS
1.0000 | ORAL_TABLET | ORAL | Status: DC | PRN
  Administered 2018-08-13 – 2018-08-16 (×10): 1 via ORAL
  Filled 2018-08-12 (×10): qty 1

## 2018-08-12 MED ORDER — IPRATROPIUM-ALBUTEROL 0.5-2.5 (3) MG/3ML IN SOLN
3.0000 mL | Freq: Four times a day (QID) | RESPIRATORY_TRACT | Status: DC
  Administered 2018-08-13 – 2018-08-16 (×14): 3 mL via RESPIRATORY_TRACT
  Filled 2018-08-12 (×14): qty 3

## 2018-08-12 MED ORDER — SODIUM CHLORIDE 0.9 % IV SOLN: 250.0000 mL | INTRAVENOUS | Status: DC | PRN

## 2018-08-12 MED ORDER — GUAIFENESIN ER 600 MG PO TB12
600.0000 mg | ORAL_TABLET | Freq: Two times a day (BID) | ORAL | Status: DC
  Administered 2018-08-12 – 2018-08-16 (×8): 600 mg via ORAL
  Filled 2018-08-12 (×8): qty 1

## 2018-08-12 MED ORDER — SODIUM CHLORIDE 0.9% FLUSH
3.0000 mL | Freq: Two times a day (BID) | INTRAVENOUS | Status: DC
  Administered 2018-08-12 – 2018-08-16 (×6): 3 mL via INTRAVENOUS

## 2018-08-12 MED ORDER — ONDANSETRON HCL 4 MG/2ML IJ SOLN: 4.0000 mg | Freq: Four times a day (QID) | INTRAMUSCULAR | Status: DC | PRN

## 2018-08-12 MED ORDER — METHYLPREDNISOLONE SODIUM SUCC 125 MG IJ SOLR
60.0000 mg | Freq: Four times a day (QID) | INTRAMUSCULAR | Status: DC
  Administered 2018-08-12 – 2018-08-16 (×15): 60 mg via INTRAVENOUS
  Filled 2018-08-12 (×16): qty 2

## 2018-08-12 MED ORDER — ALBUTEROL SULFATE (2.5 MG/3ML) 0.083% IN NEBU
2.5000 mg | INHALATION_SOLUTION | Freq: Four times a day (QID) | RESPIRATORY_TRACT | Status: DC
  Administered 2018-08-12: 2.5 mg via RESPIRATORY_TRACT
  Filled 2018-08-12: qty 3

## 2018-08-12 MED ORDER — IOHEXOL 350 MG/ML SOLN
75.0000 mL | Freq: Once | INTRAVENOUS | Status: AC | PRN
  Administered 2018-08-12: 75 mL via INTRAVENOUS

## 2018-08-12 MED ORDER — ACETAMINOPHEN 325 MG PO TABS: 650.0000 mg | ORAL_TABLET | Freq: Four times a day (QID) | ORAL | Status: DC | PRN

## 2018-08-12 MED ORDER — IPRATROPIUM BROMIDE 0.02 % IN SOLN
0.5000 mg | Freq: Four times a day (QID) | RESPIRATORY_TRACT | Status: DC
  Administered 2018-08-12: 0.5 mg via RESPIRATORY_TRACT
  Filled 2018-08-12: qty 2.5

## 2018-08-12 NOTE — ED Notes (Signed)
Pt given meal tray.

## 2018-08-12 NOTE — ED Notes (Signed)
Patient transported to CT 

## 2018-08-12 NOTE — ED Notes (Signed)
Pt sent with clothes, shoes, and watch on person.

## 2018-08-12 NOTE — ED Triage Notes (Signed)
Pt to ED from home with daughter c/o worsening SOB, hx of COPD and asthma, wears chronic 4L [place] at home, patient oxygen sat 92% on 4L [place] in triage.  Pt also c/o generalized pain to left hip, bilateral shoulders, states had fall in October with fractured pelvis without surgery.  Pt with chronic productive yellow cough.  Pt speaking in complete and coherent sentences, chest rise even and unlabored, skin warm and dry.

## 2018-08-12 NOTE — ED Provider Notes (Signed)
[facility] Emergency Department Provider Note  ____________________________________________  Time seen: Approximately 2:39 PM  I have reviewed the triage vital signs and the nursing notes.   HISTORY  Chief Complaint Shortness of Breath; Hip Pain; and Shoulder Pain    HPI Debra [name] is a 84 y.o. female with a history of COPD, CHF, and MI who complains of shortness of breath for the past 2 days.  Shortness of breath is constant, worse with ambulation, worse lying down at night.  She is more comfortable sitting up or sleeping on her side.  She is also having productive cough which is typical for her.   She also has nonradiating central chest pain with coughing or deep breathing.     Past Medical History:  Diagnosis Date  . Asthma   . CHF (congestive heart failure) ([place])   . COPD (chronic obstructive pulmonary disease) ([place])   . Myocardial infarction [facility])      Patient Active Problem List   Diagnosis Date Noted  . Pelvic fracture ([place]) 06/23/2018  . Dizziness 01/25/2018  . Moderate recurrent major depression ([place]) 12/25/2017  . Acute adjustment disorder with anxiety 12/25/2017  . Somatic symptom disorder 12/25/2017  . Acute respiratory failure with hypoxia ([place]) 12/22/2017  . CHF (congestive heart failure) ([place]) 12/05/2016  . CAP (community acquired pneumonia) 10/05/2016  . Chronic systolic heart failure ([place]) 06/28/2016  . COPD (chronic obstructive pulmonary disease) with emphysema ([place]) 06/28/2016  . Tobacco use 06/28/2016  . Acidosis 09/14/2015  . Hypotension 09/14/2015  . Hypokalemia 09/14/2015  . COPD exacerbation ([place]) 09/01/2015  . Sepsis ([place]) 08/27/2015     Past Surgical History:  Procedure Laterality Date  . ABDOMINAL HYSTERECTOMY    . BREAST SURGERY    . BYPASS GRAFT  2007   triple  . CHOLECYSTECTOMY    . CORONARY ARTERY BYPASS GRAFT    . TONSILLECTOMY       Prior to Admission medications   Medication Sig Start Date  End Date Taking? Authorizing Provider  albuterol (PROVENTIL HFA;VENTOLIN HFA) 108 (90 Base) MCG/ACT inhaler Inhale 1-2 puffs into the lungs every 6 (six) hours as needed for wheezing or shortness of breath.    [provider]  albuterol (PROVENTIL) (2.5 MG/3ML) 0.083% nebulizer solution Take 2.5 mg by nebulization 4 (four) times daily as needed for wheezing or shortness of breath.    [provider]  ALPRAZolam Duanne Moron) 0.5 MG tablet Take 1 tablet (0.5 mg total) by mouth 2 (two) times daily. 12/28/17   Gouru, Illene Silver, MD  atorvastatin (LIPITOR) 10 MG tablet Take 10 mg by mouth every evening. 05/16/16   [provider]  azelastine (ASTELIN) 0.1 % nasal spray Place 1 spray into both nostrils daily. 05/30/18   [provider]  diclofenac sodium (VOLTAREN) 1 % GEL Apply 1 g topically 2 (two) times daily. Apply to knees 06/02/18   [provider]  docusate sodium (COLACE) 100 MG capsule Take 1 capsule (100 mg total) by mouth daily as needed for mild constipation. 12/28/17   Gouru, Illene Silver, MD  feeding supplement, ENSURE ENLIVE, (ENSURE ENLIVE) LIQD Take 237 mLs by mouth 2 (two) times daily between meals. 12/08/16   Bettey Costa, MD  FLUoxetine (PROZAC) 20 MG capsule Take 1 capsule (20 mg total) by mouth daily. 09/01/15   Aldean Jewett, MD  guaiFENesin (ROBITUSSIN) 100 MG/5ML SOLN Take 5 mLs (100 mg total) by mouth every 4 (four) hours as needed for cough or to loosen phlegm.  12/28/17   Nicholes Mango, MD  HYDROcodone-acetaminophen (NORCO/VICODIN) 5-325 MG tablet Take 1 tablet by mouth every 6 (six) hours as needed for moderate pain. 12/28/17   Gouru, Illene Silver, MD  ipratropium-albuterol (DUONEB) 0.5-2.5 (3) MG/3ML SOLN Take 3 mLs by nebulization every 6 (six) hours as needed. 12/28/17   Gouru, Illene Silver, MD  latanoprost (XALATAN) 0.005 % ophthalmic solution Place 1 drop into both eyes at bedtime. 05/10/16   [provider]  meclizine (ANTIVERT) 12.5 MG tablet Take 1 tablet  (12.5 mg total) by mouth 3 (three) times daily as needed for dizziness. 01/27/18   Fritzi Mandes, MD  metoprolol tartrate (LOPRESSOR) 25 MG tablet Take 1 tablet (25 mg total) by mouth at bedtime. 01/27/18   Fritzi Mandes, MD  OS-CAL CALCIUM + D3 500-200 MG-UNIT TABS Take 1 tablet by mouth 2 (two) times daily. 05/05/18   [provider]  pantoprazole (PROTONIX) 40 MG tablet Take 40 mg by mouth daily.    [provider]  senna-docusate (SENOKOT-S) 8.6-50 MG tablet Take 1 tablet by mouth at bedtime as needed for mild constipation. 12/28/17   Gouru, Illene Silver, MD  tiotropium (SPIRIVA HANDIHALER) 18 MCG inhalation capsule Place 1 capsule (18 mcg total) into inhaler and inhale every morning. 09/01/15   Aldean Jewett, MD  traZODone (DESYREL) 50 MG tablet Take 50-100 mg by mouth at bedtime as needed for sleep. 06/16/18   [provider]     Allergies Codeine   Family History  Problem Relation Age of Onset  . CAD Father     Social History Social History   Tobacco Use  . Smoking status: Former Smoker    Packs/day: 0.50    Years: 55.00    Pack years: 27.50    Last attempt to quit: 11/21/2017    Years since quitting: 0.7  . Smokeless tobacco: Never Used  Substance Use Topics  . Alcohol use: No  . Drug use: No    Review of Systems  Constitutional:   No fever or chills.  ENT:   No sore throat. No rhinorrhea. Cardiovascular: Positive as above chest pain without syncope. Respiratory:   Positive shortness of breath and chronic productive cough. Gastrointestinal:   Negative for abdominal pain, vomiting and diarrhea.  Musculoskeletal:   Negative for focal pain or swelling All other systems reviewed and are negative except as documented above in ROS and HPI.  ____________________________________________   PHYSICAL EXAM:  VITAL SIGNS: ED Triage Vitals [08/12/18 1248]  Enc Vitals Group     BP (!) 135/58     Pulse Rate 77     Resp (!) 22     Temp 97.7 F (36.5 C)      Temp Source Oral     SpO2 92 %     Weight 164 lb (74.4 kg)     Height 5\' 1"  (1.549 m)     Head Circumference      Peak Flow      Pain Score 7     Pain Loc      Pain Edu?      Excl. in [place]?     Vital signs reviewed, nursing assessments reviewed.   Constitutional:   Alert and oriented.  Ill-appearing Eyes:   Conjunctivae are normal. EOMI. PERRL. ENT      Head:   Normocephalic and atraumatic.      Nose:   No congestion/rhinnorhea.       Mouth/Throat:   MMM, no pharyngeal erythema. No peritonsillar mass.  Neck:   No meningismus. Full ROM. Hematological/Lymphatic/Immunilogical:   No cervical lymphadenopathy. Cardiovascular:   RRR. Symmetric bilateral radial and DP pulses.  No murmurs. Cap refill less than 2 seconds. Respiratory:   Tachypnea.  Crackles at bilateral bases.  No expiratory wheezing.  Symmetric air entry in all lung fields. Gastrointestinal:   Soft and nontender. Non distended. There is no CVA tenderness.  No rebound, rigidity, or guarding.  Musculoskeletal:   Normal range of motion in all extremities. No joint effusions.  No lower extremity tenderness.  No edema. Neurologic:   Normal speech and language.  Motor grossly intact. No acute focal neurologic deficits are appreciated.  Skin:    Skin is warm, dry and intact. No rash noted.  No petechiae, purpura, or bullae.  ____________________________________________    LABS (pertinent positives/negatives) (all labs ordered are listed, but only abnormal results are displayed) Labs Reviewed  BASIC METABOLIC PANEL - Abnormal; Notable for the following components:      Result Value   CO2 33 (*)    Glucose, Bld 117 (*)    Creatinine, Ser 1.06 (*)    GFR calc non Af Amer 49 (*)    GFR calc Af Amer 56 (*)    All other components within normal limits  CBC - Abnormal; Notable for the following components:   RBC 3.22 (*)    Hemoglobin 10.5 (*)    HCT 33.9 (*)    MCV 105.3 (*)    All other components within  normal limits  TROPONIN I  PROTIME-INR   ____________________________________________   EKG  Interpreted by me Normal sinus rhythm rate of 79, normal axis and intervals.  Normal QRS and ST segments.  T wave inversions in the anterior leads and lead III.  When compared to previous EKG June 23, 2018, the T wave inversions are new.  ____________________________________________    RADIOLOGY  Dg Chest 2 View  Result Date: 08/12/2018 CLINICAL DATA:  Short of breath, history of COPD and asthma EXAM: CHEST - 2 VIEW COMPARISON:  Chest x-ray of 01/25/2018 FINDINGS: Cardiomegaly is present with interstitial markings bilaterally and probable small effusions most consistent with mild CHF. No focal pneumonia is seen. Loculated fluid cannot be excluded posteriorly at the lung base on the lateral view. Median sternotomy sutures are present from prior CABG and cardiomegaly is stable. Multiple thoracic vertebral augmentations are noted. IMPRESSION: 1. Cardiomegaly with prominent interstitial markings and small effusions most consistent with mild CHF. 2. Cannot exclude some loculated fluid posteriorly at the lung base on the lateral view. Electronically Signed   By: Ivar Drape M.D.   On: 08/12/2018 13:41   Dg Pelvis 1-2 Views  Result Date: 08/12/2018 CLINICAL DATA:  Chronic hip pain on the left, fell on October 2019 with pelvic fracture EXAM: PELVIS - 1-2 VIEW COMPARISON:  MR pelvis of 06/23/2018 FINDINGS: Fractures of the left inferior pubic ramus are identified which are subacute. No acute hip fracture is seen. Changes of avascular necrosis are present in the right femoral head with the left femoral head changes as noted by MR not as well visualized by plain film. The SI joints appear well corticated. Vertebral augmentation is noted at the L4 level. IMPRESSION: 1. No acute hip fracture. 2. Subacute fractures of the left inferior pubic ramus. 3. Avascular necrosis of the right femoral head as noted on  recent MR. The right femoral head involvement of AVN noted by MR is not seen by plain film. Electronically Signed   By:  Ivar Drape M.D.   On: 08/12/2018 14:56   Ct Angio Chest Pe W And/or Wo Contrast  Result Date: 08/12/2018 CLINICAL DATA:  Worsening shortness of breath, history of COPD and asthma, recent fall injuring the pelvis EXAM: CT ANGIOGRAPHY CHEST WITH CONTRAST TECHNIQUE: Multidetector CT imaging of the chest was performed using the standard protocol during bolus administration of intravenous contrast. Multiplanar CT image reconstructions and MIPs were obtained to evaluate the vascular anatomy. CONTRAST:  38mL OMNIPAQUE IOHEXOL 350 MG/ML SOLN COMPARISON:  Chest x-ray of 08/12/2018 FINDINGS: Cardiovascular: The pulmonary arteries are well opacified. There is no evidence of pulmonary embolism. There is faint opacification of the thoracic aorta with moderate thoracic aortic atherosclerosis present. Median sternotomy sutures are noted from prior CABG and cardiomegaly is present. No pericardial effusion is noted. Mediastinum/Nodes: There is evidence of mediastinal and hilar adenopathy particularly in the right infrahilar region and within the subcarinal mediastinum. Subcarinal adenopathy measures 24 mm. A pretracheal node on the right measures 11 mm with a right infrahilar node of 11 mm. Slightly prominent prevascular nodes also are present. A superior mediastinal lymph node on image 44 series 5 measures 12 mm. The thyroid gland is unremarkable. The thoracic esophagus is unremarkable. Lungs/Pleura: On lung window images, there is relatively severe centrilobular emphysema present with some paraseptal emphysema as well. Peripheral subpleural reticulation is present consistent with scarring. However, within the right lower lobe there is a rounded mass of 5.6 x 3.8 cm with a height 4.9 cm consistent with primary lung carcinoma. This in retrospect is vaguely visualized on chest x-ray today, superimposed upon  chronic change and probable mild edema. There is some narrowing of the right lower lobe pulmonary artery branches which traverse this mass. There is postobstructive pneumonitis within the right lower lobe. Bilateral pleural effusions are present left larger than right. There is parenchymal opacity within the left lower lobe which could be related to atelectasis and scarring, but metastatic involvement of the left lower lobe cannot be excluded. With cardiomegaly, small effusions, and prominent interstitium, mild superimposed interstitial edema is a consideration. The opacity questioned on lateral chest x-ray posteriorly may represent loculated fluid. Upper Abdomen: On this CT of the chest, no hepatic lesion is seen. In view of the mass in the right lower lobe by CT, CT the abdomen pelvis with IV contrast may be helpful. The portion of the upper abdomen that is visualized is unremarkable. Musculoskeletal: There is slight curvature of the thoracic vertebrae conscious next to the right and several mid and lower thoracic vertebroplasties have been performed. Review of the MIP images confirms the above findings. IMPRESSION: 1. No evidence of acute pulmonary embolism. 2. However, there is a rounded mass of 5.6 x 3.8 x 4.9 cm within the right lower lobe consistent with primary lung carcinoma with postobstructive pneumonitis present. Mediastinal adenopathy. 3. Opacities in the posterior left lower lobe could represent pneumonia or possibly metastatic involvement of the left lung. 4. Small effusions left-greater-than-right with cardiomegaly and somewhat prominent interstitial markings may represent mild interstitial edema superimposed on chronic change. 5. Centrilobular and to a lesser degree paraseptal emphysema. Electronically Signed   By: Ivar Drape M.D.   On: 08/12/2018 15:46    ____________________________________________   PROCEDURES Angiocath insertion Date/Time: 08/12/2018 3:06 PM Performed by: Carrie Mew, MD Authorized by: Carrie Mew, MD  Consent: Verbal consent obtained. Risks and benefits: risks, benefits and alternatives were discussed Consent given by: patient Patient understanding: patient states understanding of the procedure being performed  Patient consent: the patient's understanding of the procedure matches consent given Required items: required blood products, implants, devices, and special equipment available Patient identity confirmed: verbally with patient Local anesthesia used: no  Anesthesia: Local anesthesia used: no  Sedation: Patient sedated: no  Patient tolerance: Patient tolerated the procedure well with no immediate complications Comments: Peripheral IV inserted by me due to multiple failed nursing attempts.  Using continuous ultrasound visualization, a 20-gauge IV placed into cephalic vein just proximal to the right antecubital fossa.     ____________________________________________  DIFFERENTIAL DIAGNOSIS   Non-STEMI, pulmonary embolism, pleurisy, pneumothorax, CHF exacerbation, pneumonia  CLINICAL IMPRESSION / ASSESSMENT AND PLAN / ED COURSE  Pertinent labs & imaging results that were available during my care of the patient were reviewed by me and considered in my medical decision making (see chart for details).    Patient is not in distress but presents with shortness of breath and pleuritic chest pain, most concerning for CHF versus pulmonary embolism.  Chest x-ray shows a possible loculated fluid collection at the base, as well as evidence of scattered pulmonary edema consistent with CHF.  Initial troponin negative.  With diagnostic uncertainty related to the chest x-ray findings, her recent pelvis fracture which has limited her mobility, the pleuritic nature of chest pain, I will obtain a CT angiogram to more closely evaluate the pulmonary findings and evaluate for pulmonary embolism.  Patient is severely symptomatic, maintaining oxygen  level of about 90% on her usual 4 L nasal cannula.    Clinical Course as of Aug 12 1621  Tue Aug 12, 2018  1557 CT chest shows a mass in the right lung concerning for carcinoma with postobstructive pneumonitis and with further evidence of pulmonary metastatic disease as well as some pulmonary edema.  I will start antibiotics and plan to admit.  No PE   [PS]    Clinical Course User Index [PS] Carrie Mew, MD     ____________________________________________   FINAL CLINICAL IMPRESSION(S) / ED DIAGNOSES    Final diagnoses:  Pulmonary mass  Postobstructive pneumonia  Acute on chronic respiratory failure with hypoxia [facility])     ED Discharge Orders    None      Portions of this note were generated with dragon dictation software. Dictation errors may occur despite best attempts at proofreading.    Carrie Mew, MD 08/12/18 1622

## 2018-08-12 NOTE — Progress Notes (Signed)
Advanced care plan.  Purpose of the Encounter: CODE STATUS  Parties in Attendance: Patient herself  Patient's Decision Capacity: Intact  Subjective/Patient's story: Patient is a 84 year old female with history of chronic diastolic CHF, COPD, coronary artery disease, chronic respiratory failure presenting to the hospital with worsening shortness of breath noted to have lung mass   Objective/Medical story I discussed with the patient regarding her desires for cardiac and pulmonary resuscitation   Goals of care determination:  She states that she would like to be a DNR   CODE STATUS: DO NOT RESUSCITATE   Time spent discussing advanced care planning: 16 minutes

## 2018-08-12 NOTE — ED Notes (Signed)
3 IV attempts by this RN. MD aware.

## 2018-08-12 NOTE — ED Notes (Signed)
ED TO INPATIENT HANDOFF REPORT  Name/Age/Gender Debra [name] 84 y.o. female  Code Status Code Status History    Date Active Date Inactive Code Status Order ID Comments User Context   06/23/2018 1846 06/25/2018 2120 DNR [id number]  [name], MD Inpatient   01/25/2018 1810 01/27/2018 1940 DNR [id number]  [name], MD Inpatient   12/22/2017 1631 12/28/2017 2218 DNR [id number]  Demetrios Loll, MD Inpatient   12/05/2016 1102 12/08/2016 1724 DNR [id number]  Hillary Bow, MD ED   10/05/2016 2302 10/10/2016 2354 Full Code [id number]  Lance Coon, MD Inpatient   06/10/2016 1032 06/10/2016 1050 Full Code [id number]  Theodoro Grist, MD Inpatient   09/14/2015 2019 09/16/2015 2034 Full Code [id number]  Theodoro Grist, MD Inpatient   08/27/2015 0805 09/01/2015 2042 Full Code [id number]  Harrie Foreman, MD Inpatient    Questions for Most Recent Historical Code Status (Order [id number])    Question Answer Comment   In the event of cardiac or respiratory ARREST Do not call a "code blue"    In the event of cardiac or respiratory ARREST Do not perform Intubation, CPR, defibrillation or ACLS    In the event of cardiac or respiratory ARREST Use medication by any route, position, wound care, and other measures to relive pain and suffering. May use oxygen, suction and manual treatment of airway obstruction as needed for comfort.       Home/SNF/Other Home  Chief Complaint pelvic pain;hip pain  Level of Care/Admitting Diagnosis ED Disposition    ED Disposition Condition [facility]: [facility] [[id number]]  Level of Care: Med-Surg [16]  Diagnosis: SOB (shortness of breath) [263785]  Admitting Physician: Dustin Flock [[id number]]  Attending Physician: Dustin Flock [[id number]]  Estimated length of stay: past midnight tomorrow  Certification:: I certify this patient will need inpatient services for at least 2 midnights  PT Class (Do Not Modify):  Inpatient [101]  PT Acc Code (Do Not Modify): Private [1]       Medical History Past Medical History:  Diagnosis Date  . Asthma   . CHF (congestive heart failure) ([place])   . COPD (chronic obstructive pulmonary disease) ([place])   . Myocardial infarction ([facility])     Allergies Allergies  Allergen Reactions  . Codeine Itching and Rash    IV Location/Drains/Wounds Patient Lines/Drains/Airways Status   Active Line/Drains/Airways    Name:   Placement date:   Placement time:   Site:   Days:   Peripheral IV 08/12/18 Right Antecubital   08/12/18    1506    Antecubital   less than 1   External Urinary Catheter   12/23/17    0509    -   232   External Urinary Catheter   01/25/18    2336    -   199   Wound / Incision (Open or Dehisced) 12/22/17 Other (Comment) Back Left small avulsion , round .5 cm around   12/22/17    -    Back   233   Wound / Incision (Open or Dehisced) 12/22/17  Back Lateral;Right;Upper   12/22/17    -    Back   233   Wound / Incision (Open or Dehisced) 12/22/17 Back Lateral;Left;Upper   12/22/17    -    Back   233          Labs/Imaging Results for orders placed or performed during the hospital encounter of 08/12/18 (from the past 48 hour(s))  Basic metabolic panel     Status: Abnormal   Collection Time: 08/12/18 12:54 PM  Result Value Ref Range   Sodium 140 135 - 145 mmol/L   Potassium 4.3 3.5 - 5.1 mmol/L   Chloride 98 98 - 111 mmol/L   CO2 33 (H) 22 - 32 mmol/L   Glucose, Bld 117 (H) 70 - 99 mg/dL   BUN 14 8 - 23 mg/dL   Creatinine, Ser 1.06 (H) 0.44 - 1.00 mg/dL   Calcium 9.3 8.9 - 10.3 mg/dL   GFR calc non Af Amer 49 (L) >60 mL/min   GFR calc Af Amer 56 (L) >60 mL/min   Anion gap 9 5 - 15    Comment: Performed at [facility], [address] [place], [place] [address]  CBC     Status: Abnormal   Collection Time: 08/12/18 12:54 PM  Result Value Ref Range   WBC 8.5 4.0 - 10.5 K/uL   RBC 3.22 (L) 3.87 - 5.11 MIL/uL   Hemoglobin 10.5 (L) 12.0 -  15.0 g/dL   HCT 33.9 (L) 36.0 - 46.0 %   MCV 105.3 (H) 80.0 - 100.0 fL   MCH 32.6 26.0 - 34.0 pg   MCHC 31.0 30.0 - 36.0 g/dL   RDW 15.0 11.5 - 15.5 %   Platelets 236 150 - 400 K/uL   nRBC 0.0 0.0 - 0.2 %    Comment: Performed at [facility], [address] [place], [place] [address]  Troponin I - ONCE - STAT     Status: None   Collection Time: 08/12/18 12:54 PM  Result Value Ref Range   Troponin I <0.03 <0.03 ng/mL    Comment: Performed at [facility], [address] [place], [place] [address]  Protime-INR (order if Patient is taking Coumadin / Warfarin)     Status: None   Collection Time: 08/12/18 12:54 PM  Result Value Ref Range   Prothrombin Time 13.8 11.4 - 15.2 seconds   INR 1.07     Comment: Performed at [facility], [address], [place], [place] [address]   Dg Chest 2 View  Result Date: 08/12/2018 CLINICAL DATA:  Short of breath, history of COPD and asthma EXAM: CHEST - 2 VIEW COMPARISON:  Chest x-ray of 01/25/2018 FINDINGS: Cardiomegaly is present with interstitial markings bilaterally and probable small effusions most consistent with mild CHF. No focal pneumonia is seen. Loculated fluid cannot be excluded posteriorly at the lung base on the lateral view. Median sternotomy sutures are present from prior CABG and cardiomegaly is stable. Multiple thoracic vertebral augmentations are noted. IMPRESSION: 1. Cardiomegaly with prominent interstitial markings and small effusions most consistent with mild CHF. 2. Cannot exclude some loculated fluid posteriorly at the lung base on the lateral view. Electronically Signed   By: Ivar Drape M.D.   On: 08/12/2018 13:41   Dg Pelvis 1-2 Views  Result Date: 08/12/2018 CLINICAL DATA:  Chronic hip pain on the left, fell on October 2019 with pelvic fracture EXAM: PELVIS - 1-2 VIEW COMPARISON:  MR pelvis of 06/23/2018 FINDINGS: Fractures of the left inferior pubic ramus are identified which are subacute. No acute  hip fracture is seen. Changes of avascular necrosis are present in the right femoral head with the left femoral head changes as noted by MR not as well visualized by plain film. The SI joints appear well corticated. Vertebral augmentation is noted at the L4 level. IMPRESSION: 1. No acute hip fracture. 2. Subacute fractures  of the left inferior pubic ramus. 3. Avascular necrosis of the right femoral head as noted on recent MR. The right femoral head involvement of AVN noted by MR is not seen by plain film. Electronically Signed   By: Ivar Drape M.D.   On: 08/12/2018 14:56   Ct Angio Chest Pe W And/or Wo Contrast  Result Date: 08/12/2018 CLINICAL DATA:  Worsening shortness of breath, history of COPD and asthma, recent fall injuring the pelvis EXAM: CT ANGIOGRAPHY CHEST WITH CONTRAST TECHNIQUE: Multidetector CT imaging of the chest was performed using the standard protocol during bolus administration of intravenous contrast. Multiplanar CT image reconstructions and MIPs were obtained to evaluate the vascular anatomy. CONTRAST:  73mL OMNIPAQUE IOHEXOL 350 MG/ML SOLN COMPARISON:  Chest x-ray of 08/12/2018 FINDINGS: Cardiovascular: The pulmonary arteries are well opacified. There is no evidence of pulmonary embolism. There is faint opacification of the thoracic aorta with moderate thoracic aortic atherosclerosis present. Median sternotomy sutures are noted from prior CABG and cardiomegaly is present. No pericardial effusion is noted. Mediastinum/Nodes: There is evidence of mediastinal and hilar adenopathy particularly in the right infrahilar region and within the subcarinal mediastinum. Subcarinal adenopathy measures 24 mm. A pretracheal node on the right measures 11 mm with a right infrahilar node of 11 mm. Slightly prominent prevascular nodes also are present. A superior mediastinal lymph node on image 44 series 5 measures 12 mm. The thyroid gland is unremarkable. The thoracic esophagus is unremarkable.  Lungs/Pleura: On lung window images, there is relatively severe centrilobular emphysema present with some paraseptal emphysema as well. Peripheral subpleural reticulation is present consistent with scarring. However, within the right lower lobe there is a rounded mass of 5.6 x 3.8 cm with a height 4.9 cm consistent with primary lung carcinoma. This in retrospect is vaguely visualized on chest x-ray today, superimposed upon chronic change and probable mild edema. There is some narrowing of the right lower lobe pulmonary artery branches which traverse this mass. There is postobstructive pneumonitis within the right lower lobe. Bilateral pleural effusions are present left larger than right. There is parenchymal opacity within the left lower lobe which could be related to atelectasis and scarring, but metastatic involvement of the left lower lobe cannot be excluded. With cardiomegaly, small effusions, and prominent interstitium, mild superimposed interstitial edema is a consideration. The opacity questioned on lateral chest x-ray posteriorly may represent loculated fluid. Upper Abdomen: On this CT of the chest, no hepatic lesion is seen. In view of the mass in the right lower lobe by CT, CT the abdomen pelvis with IV contrast may be helpful. The portion of the upper abdomen that is visualized is unremarkable. Musculoskeletal: There is slight curvature of the thoracic vertebrae conscious next to the right and several mid and lower thoracic vertebroplasties have been performed. Review of the MIP images confirms the above findings. IMPRESSION: 1. No evidence of acute pulmonary embolism. 2. However, there is a rounded mass of 5.6 x 3.8 x 4.9 cm within the right lower lobe consistent with primary lung carcinoma with postobstructive pneumonitis present. Mediastinal adenopathy. 3. Opacities in the posterior left lower lobe could represent pneumonia or possibly metastatic involvement of the left lung. 4. Small effusions  left-greater-than-right with cardiomegaly and somewhat prominent interstitial markings may represent mild interstitial edema superimposed on chronic change. 5. Centrilobular and to a lesser degree paraseptal emphysema. Electronically Signed   By: Ivar Drape M.D.   On: 08/12/2018 15:46    Pending Labs Unresulted Labs (From admission, onward)  Start     Ordered   08/15/18 2230  Vancomycin, trough  Once-Timed,   STAT     08/12/18 1727   08/13/18 0500  Creatinine, serum  Daily,   STAT     08/12/18 1705   Signed and Held  Culture, sputum-assessment  Once,   R     Signed and Held   Signed and Held  CBC  Tomorrow morning,   R     Signed and Held   Signed and Held  Basic metabolic panel  Tomorrow morning,   R     Signed and Held          Vitals/Pain Today's Vitals   08/12/18 1730 08/12/18 1830 08/12/18 1900 08/12/18 1930  BP: (!) 167/76 (!) 148/65 (!) 142/74 (!) 146/70  Pulse: 83 76 83 82  Resp: (!) 22 17 17 20   Temp:      TempSrc:      SpO2: (!) 89% 92% 92% 94%  Weight:      Height:      PainSc:        Isolation Precautions No active isolations  Medications Medications  methylPREDNISolone sodium succinate (SOLU-MEDROL) 125 mg/2 mL injection 60 mg (60 mg Intravenous Given 08/12/18 1741)  ceFEPIme (MAXIPIME) 2 g in sodium chloride 0.9 % 100 mL IVPB (has no administration in time range)  vancomycin (VANCOCIN) IVPB 1000 mg/200 mL premix (has no administration in time range)  iohexol (OMNIPAQUE) 350 MG/ML injection 75 mL (75 mLs Intravenous Contrast Given 08/12/18 1514)  vancomycin (VANCOCIN) IVPB 1000 mg/200 mL premix ( Intravenous Stopped 08/12/18 1808)  ceFEPIme (MAXIPIME) 2 g in sodium chloride 0.9 % 100 mL IVPB ( Intravenous Stopped 08/12/18 1704)    Mobility walks with person assist

## 2018-08-12 NOTE — Consult Note (Addendum)
Pharmacy Antibiotic Note  Debra [name] is a 84 y.o. female admitted on 08/12/2018 with pneumonia.  Pharmacy has been consulted for Vancomycin and Cefepime dosing.  Plan: Vancomycin 1000mg  IV every 24 hours.  Goal trough 15-20 mcg/mL. With stacked dosing 6 hours after initial 1g dose given in the ER  Cefepime 2g q 24h (renally adjusted)  Will monitor Scr daily and check Vancomycin trough prior to the 4th dose   Ke = 0.035, T1/2 = 19.8, Vd = 52.1  Height: 5\' 1"  (154.9 cm) Weight: 164 lb (74.4 kg) IBW/kg (Calculated) : 47.8  Temp (24hrs), Avg:97.7 F (36.5 C), Min:97.7 F (36.5 C), Max:97.7 F (36.5 C)  Recent Labs  Lab 08/12/18 1254  WBC 8.5  CREATININE 1.06*    Estimated Creatinine Clearance: 37.1 mL/min (A) (by C-G formula based on SCr of 1.06 mg/dL (H)).    Allergies  Allergen Reactions  . Codeine Itching and Rash    Antimicrobials this admission: Vancomycin 12/03 >>   Cefepime 12/03 >>   Dose adjustments this admission: None at this time  Microbiology results: No current cultures  Thank you for allowing pharmacy to be a part of this patient's care.  Lu Duffel, PharmD Clinical Pharmacist 08/12/2018 5:00 PM

## 2018-08-12 NOTE — H&P (Signed)
[facility] NAME: Debra [name]    MR#:  [id number]  DATE OF BIRTH:  04/18/1933  DATE OF ADMISSION:  08/12/2018  PRIMARY CARE PHYSICIAN: Jodi Marble, MD   REQUESTING/REFERRING PHYSICIAN: Carrie Mew, MD  CHIEF COMPLAINT:   Chief Complaint  Patient presents with  . Shortness of Breath  . Hip Pain  . Shoulder Pain    HISTORY OF PRESENT ILLNESS: Debra [name]  is a 84 y.o. female with a known history of chronic diastolic CHF, COPD, coronary artery disease, chronic respiratory failure presenting with shortness of breath and cough.  Patient also has pain in multiple joints as well.  Patient was evaluated with a chest x-ray which was inconclusive therefore had a CT scan of the chest which showed a right lower lung mass associated with postobstructive pneumonia.  Patient denies any fevers or chills.  Complains of shortness of breath that has progressively gotten worse.  She has not had any wheezing.    PAST MEDICAL HISTORY:   Past Medical History:  Diagnosis Date  . Asthma   . CHF (congestive heart failure) ([place])   . COPD (chronic obstructive pulmonary disease) ([place])   . Myocardial infarction [facility])     PAST SURGICAL HISTORY:  Past Surgical History:  Procedure Laterality Date  . ABDOMINAL HYSTERECTOMY    . BREAST SURGERY    . BYPASS GRAFT  2007   triple  . CHOLECYSTECTOMY    . CORONARY ARTERY BYPASS GRAFT    . TONSILLECTOMY      SOCIAL HISTORY:  Social History   Tobacco Use  . Smoking status: Former Smoker    Packs/day: 0.50    Years: 55.00    Pack years: 27.50    Last attempt to quit: 11/21/2017    Years since quitting: 0.7  . Smokeless tobacco: Never Used  Substance Use Topics  . Alcohol use: No    FAMILY HISTORY:  Family History  Problem Relation Age of Onset  . CAD Father     DRUG ALLERGIES:  Allergies  Allergen Reactions  . Codeine Itching and Rash    REVIEW OF SYSTEMS:   CONSTITUTIONAL:  No fever, fatigue or weakness.  EYES: No blurred or double vision.  EARS, NOSE, AND THROAT: No tinnitus or ear pain.  RESPIRATORY: Positive cough, positive shortness of breath, positive wheezing or hemoptysis.  CARDIOVASCULAR: No chest pain, orthopnea, edema.  GASTROINTESTINAL: No nausea, vomiting, diarrhea or abdominal pain.  GENITOURINARY: No dysuria, hematuria.  ENDOCRINE: No polyuria, nocturia,  HEMATOLOGY: No anemia, easy bruising or bleeding SKIN: No rash or lesion. MUSCULOSKELETAL: No joint pain or arthritis.   NEUROLOGIC: No tingling, numbness, weakness.  PSYCHIATRY: No anxiety or depression.   MEDICATIONS AT HOME:  Prior to Admission medications   Medication Sig Start Date End Date Taking? Authorizing Provider  albuterol (PROVENTIL HFA;VENTOLIN HFA) 108 (90 Base) MCG/ACT inhaler Inhale 1-2 puffs into the lungs every 6 (six) hours as needed for wheezing or shortness of breath.    [provider]  albuterol (PROVENTIL) (2.5 MG/3ML) 0.083% nebulizer solution Take 2.5 mg by nebulization 4 (four) times daily as needed for wheezing or shortness of breath.    [provider]  ALPRAZolam Duanne Moron) 0.5 MG tablet Take 1 tablet (0.5 mg total) by mouth 2 (two) times daily. 12/28/17   Gouru, Illene Silver, MD  atorvastatin (LIPITOR) 10 MG tablet Take 10 mg by mouth every evening. 05/16/16   [provider]  azelastine (ASTELIN)  0.1 % nasal spray Place 1 spray into both nostrils daily. 05/30/18   [provider]  diclofenac sodium (VOLTAREN) 1 % GEL Apply 1 g topically 2 (two) times daily. Apply to knees 06/02/18   [provider]  docusate sodium (COLACE) 100 MG capsule Take 1 capsule (100 mg total) by mouth daily as needed for mild constipation. 12/28/17   Gouru, Illene Silver, MD  feeding supplement, ENSURE ENLIVE, (ENSURE ENLIVE) LIQD Take 237 mLs by mouth 2 (two) times daily between meals. 12/08/16   Bettey Costa, MD  FLUoxetine (PROZAC) 20 MG capsule Take 1 capsule (20 mg  total) by mouth daily. 09/01/15   Aldean Jewett, MD  guaiFENesin (ROBITUSSIN) 100 MG/5ML SOLN Take 5 mLs (100 mg total) by mouth every 4 (four) hours as needed for cough or to loosen phlegm. 12/28/17   Nicholes Mango, MD  HYDROcodone-acetaminophen (NORCO/VICODIN) 5-325 MG tablet Take 1 tablet by mouth every 6 (six) hours as needed for moderate pain. 12/28/17   Gouru, Illene Silver, MD  ipratropium-albuterol (DUONEB) 0.5-2.5 (3) MG/3ML SOLN Take 3 mLs by nebulization every 6 (six) hours as needed. 12/28/17   Gouru, Illene Silver, MD  latanoprost (XALATAN) 0.005 % ophthalmic solution Place 1 drop into both eyes at bedtime. 05/10/16   [provider]  meclizine (ANTIVERT) 12.5 MG tablet Take 1 tablet (12.5 mg total) by mouth 3 (three) times daily as needed for dizziness. 01/27/18   Fritzi Mandes, MD  metoprolol tartrate (LOPRESSOR) 25 MG tablet Take 1 tablet (25 mg total) by mouth at bedtime. 01/27/18   Fritzi Mandes, MD  OS-CAL CALCIUM + D3 500-200 MG-UNIT TABS Take 1 tablet by mouth 2 (two) times daily. 05/05/18   [provider]  pantoprazole (PROTONIX) 40 MG tablet Take 40 mg by mouth daily.    [provider]  senna-docusate (SENOKOT-S) 8.6-50 MG tablet Take 1 tablet by mouth at bedtime as needed for mild constipation. 12/28/17   Gouru, Illene Silver, MD  tiotropium (SPIRIVA HANDIHALER) 18 MCG inhalation capsule Place 1 capsule (18 mcg total) into inhaler and inhale every morning. 09/01/15   Aldean Jewett, MD  traZODone (DESYREL) 50 MG tablet Take 50-100 mg by mouth at bedtime as needed for sleep. 06/16/18   [provider]      PHYSICAL EXAMINATION:   VITAL SIGNS: Blood pressure (!) 129/110, pulse 90, temperature 97.7 F (36.5 C), temperature source Oral, resp. rate 19, height 5\' 1"  (1.549 m), weight 74.4 kg, SpO2 (!) 85 %.  GENERAL:  84 y.o.-year-old patient lying in the bed with no acute distress.  EYES: Pupils equal, round, reactive to light and accommodation. No scleral icterus.  Extraocular muscles intact.  HEENT: Head atraumatic, normocephalic. Oropharynx and nasopharynx clear.  NECK:  Supple, no jugular venous distention. No thyroid enlargement, no tenderness.  LUNGS: Bilateral rhonchus throughout both lung, no accesory mucle usage CARDIOVASCULAR: S1, S2 normal. No murmurs, rubs, or gallops.  ABDOMEN: Soft, nontender, nondistended. Bowel sounds present. No organomegaly or mass.  EXTREMITIES: No pedal edema, cyanosis, or clubbing.  NEUROLOGIC: Cranial nerves II through XII are intact. Muscle strength 5/5 in all extremities. Sensation intact. Gait not checked.  PSYCHIATRIC: The patient is alert and oriented x 3.  SKIN: No obvious rash, lesion, or ulcer.   LABORATORY PANEL:   CBC Recent Labs  Lab 08/12/18 1254  WBC 8.5  HGB 10.5*  HCT 33.9*  PLT 236  MCV 105.3*  MCH 32.6  MCHC 31.0  RDW 15.0   ------------------------------------------------------------------------------------------------------------------  Chemistries  Recent  Labs  Lab 08/12/18 1254  NA 140  K 4.3  CL 98  CO2 33*  GLUCOSE 117*  BUN 14  CREATININE 1.06*  CALCIUM 9.3   ------------------------------------------------------------------------------------------------------------------ estimated creatinine clearance is 37.1 mL/min (A) (by C-G formula based on SCr of 1.06 mg/dL (H)). ------------------------------------------------------------------------------------------------------------------ No results for input(s): TSH, T4TOTAL, T3FREE, THYROIDAB in the last 72 hours.  Invalid input(s): FREET3   Coagulation profile Recent Labs  Lab 08/12/18 1254  INR 1.07   ------------------------------------------------------------------------------------------------------------------- No results for input(s): DDIMER in the last 72 hours. -------------------------------------------------------------------------------------------------------------------  Cardiac Enzymes Recent Labs   Lab 08/12/18 1254  TROPONINI <0.03   ------------------------------------------------------------------------------------------------------------------ Invalid input(s): POCBNP  ---------------------------------------------------------------------------------------------------------------  Urinalysis    Component Value Date/Time   COLORURINE YELLOW (A) 06/23/2018 1402   APPEARANCEUR CLEAR (A) 06/23/2018 1402   APPEARANCEUR Cloudy 09/13/2014 1625   LABSPEC 1.015 06/23/2018 1402   LABSPEC 1.027 09/13/2014 1625   PHURINE 6.0 06/23/2018 1402   GLUCOSEU 50 (A) 06/23/2018 1402   GLUCOSEU Negative 09/13/2014 1625   HGBUR NEGATIVE 06/23/2018 1402   BILIRUBINUR NEGATIVE 06/23/2018 1402   BILIRUBINUR Negative 09/13/2014 1625   KETONESUR NEGATIVE 06/23/2018 1402   PROTEINUR NEGATIVE 06/23/2018 1402   NITRITE NEGATIVE 06/23/2018 1402   LEUKOCYTESUR NEGATIVE 06/23/2018 1402   LEUKOCYTESUR Negative 09/13/2014 1625     RADIOLOGY: Dg Chest 2 View  Result Date: 08/12/2018 CLINICAL DATA:  Short of breath, history of COPD and asthma EXAM: CHEST - 2 VIEW COMPARISON:  Chest x-ray of 01/25/2018 FINDINGS: Cardiomegaly is present with interstitial markings bilaterally and probable small effusions most consistent with mild CHF. No focal pneumonia is seen. Loculated fluid cannot be excluded posteriorly at the lung base on the lateral view. Median sternotomy sutures are present from prior CABG and cardiomegaly is stable. Multiple thoracic vertebral augmentations are noted. IMPRESSION: 1. Cardiomegaly with prominent interstitial markings and small effusions most consistent with mild CHF. 2. Cannot exclude some loculated fluid posteriorly at the lung base on the lateral view. Electronically Signed   By: Ivar Drape M.D.   On: 08/12/2018 13:41   Dg Pelvis 1-2 Views  Result Date: 08/12/2018 CLINICAL DATA:  Chronic hip pain on the left, fell on October 2019 with pelvic fracture EXAM: PELVIS - 1-2 VIEW  COMPARISON:  MR pelvis of 06/23/2018 FINDINGS: Fractures of the left inferior pubic ramus are identified which are subacute. No acute hip fracture is seen. Changes of avascular necrosis are present in the right femoral head with the left femoral head changes as noted by MR not as well visualized by plain film. The SI joints appear well corticated. Vertebral augmentation is noted at the L4 level. IMPRESSION: 1. No acute hip fracture. 2. Subacute fractures of the left inferior pubic ramus. 3. Avascular necrosis of the right femoral head as noted on recent MR. The right femoral head involvement of AVN noted by MR is not seen by plain film. Electronically Signed   By: Ivar Drape M.D.   On: 08/12/2018 14:56   Ct Angio Chest Pe W And/or Wo Contrast  Result Date: 08/12/2018 CLINICAL DATA:  Worsening shortness of breath, history of COPD and asthma, recent fall injuring the pelvis EXAM: CT ANGIOGRAPHY CHEST WITH CONTRAST TECHNIQUE: Multidetector CT imaging of the chest was performed using the standard protocol during bolus administration of intravenous contrast. Multiplanar CT image reconstructions and MIPs were obtained to evaluate the vascular anatomy. CONTRAST:  60mL OMNIPAQUE IOHEXOL 350 MG/ML SOLN COMPARISON:  Chest x-ray of 08/12/2018 FINDINGS: Cardiovascular: The pulmonary arteries  are well opacified. There is no evidence of pulmonary embolism. There is faint opacification of the thoracic aorta with moderate thoracic aortic atherosclerosis present. Median sternotomy sutures are noted from prior CABG and cardiomegaly is present. No pericardial effusion is noted. Mediastinum/Nodes: There is evidence of mediastinal and hilar adenopathy particularly in the right infrahilar region and within the subcarinal mediastinum. Subcarinal adenopathy measures 24 mm. A pretracheal node on the right measures 11 mm with a right infrahilar node of 11 mm. Slightly prominent prevascular nodes also are present. A superior mediastinal  lymph node on image 44 series 5 measures 12 mm. The thyroid gland is unremarkable. The thoracic esophagus is unremarkable. Lungs/Pleura: On lung window images, there is relatively severe centrilobular emphysema present with some paraseptal emphysema as well. Peripheral subpleural reticulation is present consistent with scarring. However, within the right lower lobe there is a rounded mass of 5.6 x 3.8 cm with a height 4.9 cm consistent with primary lung carcinoma. This in retrospect is vaguely visualized on chest x-ray today, superimposed upon chronic change and probable mild edema. There is some narrowing of the right lower lobe pulmonary artery branches which traverse this mass. There is postobstructive pneumonitis within the right lower lobe. Bilateral pleural effusions are present left larger than right. There is parenchymal opacity within the left lower lobe which could be related to atelectasis and scarring, but metastatic involvement of the left lower lobe cannot be excluded. With cardiomegaly, small effusions, and prominent interstitium, mild superimposed interstitial edema is a consideration. The opacity questioned on lateral chest x-ray posteriorly may represent loculated fluid. Upper Abdomen: On this CT of the chest, no hepatic lesion is seen. In view of the mass in the right lower lobe by CT, CT the abdomen pelvis with IV contrast may be helpful. The portion of the upper abdomen that is visualized is unremarkable. Musculoskeletal: There is slight curvature of the thoracic vertebrae conscious next to the right and several mid and lower thoracic vertebroplasties have been performed. Review of the MIP images confirms the above findings. IMPRESSION: 1. No evidence of acute pulmonary embolism. 2. However, there is a rounded mass of 5.6 x 3.8 x 4.9 cm within the right lower lobe consistent with primary lung carcinoma with postobstructive pneumonitis present. Mediastinal adenopathy. 3. Opacities in the  posterior left lower lobe could represent pneumonia or possibly metastatic involvement of the left lung. 4. Small effusions left-greater-than-right with cardiomegaly and somewhat prominent interstitial markings may represent mild interstitial edema superimposed on chronic change. 5. Centrilobular and to a lesser degree paraseptal emphysema. Electronically Signed   By: Ivar Drape M.D.   On: 08/12/2018 15:46    EKG: Orders placed or performed during the hospital encounter of 08/12/18  . ED EKG  . ED EKG    IMPRESSION AND PLAN: Patient is 84 year old with COPD presenting with worsening shortness of breath  1.  Acute on chronic respiratory failure this is related to right lower lung mass associated with some pneumonia I discussed with the on-call interventional radiologist who states that I can put CT guided biopsy they will review the scan tomorrow to see if that is appropriate for this patient Patient is lesion does not appear that he will be amendable to bronchoscopy Have asked oncology to see as well patient will need CT of the abdomen pelvis  2.  Postobstructive pneumonia continue current antibiotic with IV vancomycin and cefepime  3.  Acute on chronic COPD exacerbation we will treat with nebulizer steroids and antibiotics as  above  4.  Hyperlipidemia continue Lipitor  5.  Coronary artery disease continue Plavix and Lopressor  6.  Miscellaneous SCDs for DVT prophylaxis  All the records are reviewed and case discussed with ED provider. Management plans discussed with the patient, family and they are in agreement.  CODE STATUS: Code Status History    Date Active Date Inactive Code Status Order ID Comments User Context   06/23/2018 1846 06/25/2018 2120 DNR [id number]  [name], MD Inpatient   01/25/2018 1810 01/27/2018 1940 DNR [id number]  [name], MD Inpatient   12/22/2017 1631 12/28/2017 2218 DNR [id number]  Demetrios Loll, MD Inpatient   12/05/2016 1102  12/08/2016 1724 DNR [id number]  Hillary Bow, MD ED   10/05/2016 2302 10/10/2016 2354 Full Code [id number]  Lance Coon, MD Inpatient   06/10/2016 1032 06/10/2016 1050 Full Code [id number]  Theodoro Grist, MD Inpatient   09/14/2015 2019 09/16/2015 2034 Full Code [id number]  Theodoro Grist, MD Inpatient   08/27/2015 0805 09/01/2015 2042 Full Code [id number]  Harrie Foreman, MD Inpatient    Questions for Most Recent Historical Code Status (Order [id number])    Question Answer Comment   In the event of cardiac or respiratory ARREST Do not call a "code blue"    In the event of cardiac or respiratory ARREST Do not perform Intubation, CPR, defibrillation or ACLS    In the event of cardiac or respiratory ARREST Use medication by any route, position, wound care, and other measures to relive pain and suffering. May use oxygen, suction and manual treatment of airway obstruction as needed for comfort.        TOTAL TIME TAKING CARE OF THIS PATIENT:46minutes.    Dustin Flock M.D on 08/12/2018 at 4:25 PM  Between 7am to 6pm - Pager - [phone]  After 6pm go to [email or web address] - password [email or web address]  [facility] Office  [phone]  CC: Primary care physician; Jodi Marble, MD

## 2018-08-13 ENCOUNTER — Telehealth: Payer: Self-pay | Admitting: Internal Medicine

## 2018-08-13 DIAGNOSIS — J9621 Acute and chronic respiratory failure with hypoxia: Secondary | ICD-10-CM

## 2018-08-13 DIAGNOSIS — F17211 Nicotine dependence, cigarettes, in remission: Secondary | ICD-10-CM

## 2018-08-13 DIAGNOSIS — R918 Other nonspecific abnormal finding of lung field: Secondary | ICD-10-CM

## 2018-08-13 DIAGNOSIS — J189 Pneumonia, unspecified organism: Secondary | ICD-10-CM

## 2018-08-13 DIAGNOSIS — Z66 Do not resuscitate: Secondary | ICD-10-CM

## 2018-08-13 DIAGNOSIS — D649 Anemia, unspecified: Secondary | ICD-10-CM

## 2018-08-13 LAB — FOLATE: Folate: 9.6 ng/mL (ref >5.9)

## 2018-08-13 LAB — BASIC METABOLIC PANEL
Anion gap: 4 — ABNORMAL LOW (ref 5–15)
BUN: 13 mg/dL (ref 8–23)
CO2: 33 mmol/L — ABNORMAL HIGH (ref 22–32)
Calcium: 8.9 mg/dL (ref 8.9–10.3)
Chloride: 101 mmol/L (ref 98–111)
Creatinine, Ser: 0.89 mg/dL (ref 0.44–1.00)
GFR calc non Af Amer: 60 mL/min — ABNORMAL LOW (ref >60)
Glucose, Bld: 170 mg/dL — ABNORMAL HIGH (ref 70–99)
Potassium: 4.3 mmol/L (ref 3.5–5.1)
Sodium: 138 mmol/L (ref 135–145)

## 2018-08-13 LAB — CBC
HCT: 33.2 % — ABNORMAL LOW (ref 36.0–46.0)
Hemoglobin: 10.1 g/dL — ABNORMAL LOW (ref 12.0–15.0)
MCH: 32.3 pg (ref 26.0–34.0)
MCHC: 30.4 g/dL (ref 30.0–36.0)
MCV: 106.1 fL — ABNORMAL HIGH (ref 80.0–100.0)
Platelets: 228 10*3/uL (ref 150–400)
RBC: 3.13 MIL/uL — ABNORMAL LOW (ref 3.87–5.11)
RDW: 15 % (ref 11.5–15.5)
WBC: 6.9 10*3/uL (ref 4.0–10.5)
nRBC: 0 % (ref 0.0–0.2)

## 2018-08-13 LAB — MRSA PCR SCREENING: MRSA by PCR: POSITIVE — AB

## 2018-08-13 LAB — RETICULOCYTES
Immature Retic Fract: 26.3 % — ABNORMAL HIGH (ref 2.3–15.9)
RBC.: 3.06 MIL/uL — AB (ref 3.87–5.11)
Retic Count, Absolute: 84.8 10*3/uL (ref 19.0–186.0)
Retic Ct Pct: 2.8 % (ref 0.4–3.1)

## 2018-08-13 LAB — VITAMIN B12: Vitamin B-12: 381 pg/mL (ref 180–914)

## 2018-08-13 LAB — LACTATE DEHYDROGENASE: LDH: 261 U/L — ABNORMAL HIGH (ref 98–192)

## 2018-08-13 MED ORDER — SODIUM CHLORIDE 0.9 % IV SOLN
2.0000 g | Freq: Two times a day (BID) | INTRAVENOUS | Status: DC
  Administered 2018-08-13 – 2018-08-16 (×6): 2 g via INTRAVENOUS
  Filled 2018-08-13 (×9): qty 2

## 2018-08-13 MED ORDER — MORPHINE SULFATE (PF) 2 MG/ML IV SOLN
2.0000 mg | INTRAVENOUS | Status: DC | PRN
  Filled 2018-08-13: qty 1

## 2018-08-13 MED ORDER — MUPIROCIN 2 % EX OINT
1.0000 "application " | TOPICAL_OINTMENT | Freq: Two times a day (BID) | CUTANEOUS | Status: DC
  Administered 2018-08-13 – 2018-08-16 (×5): 1 via NASAL
  Filled 2018-08-13: qty 22

## 2018-08-13 MED ORDER — CHLORHEXIDINE GLUCONATE CLOTH 2 % EX PADS
6.0000 | MEDICATED_PAD | Freq: Every day | CUTANEOUS | Status: DC
  Administered 2018-08-13 – 2018-08-16 (×4): 6 via TOPICAL

## 2018-08-13 MED ORDER — SODIUM CHLORIDE 0.9 % IV SOLN: 2.0000 g | INTRAVENOUS | Status: DC

## 2018-08-13 MED ORDER — VANCOMYCIN HCL IN DEXTROSE 1-5 GM/200ML-% IV SOLN
1000.0000 mg | INTRAVENOUS | Status: DC
  Administered 2018-08-13 – 2018-08-14 (×2): 1000 mg via INTRAVENOUS
  Filled 2018-08-13 (×4): qty 200

## 2018-08-13 MED ORDER — ACETYLCYSTEINE 20 % IN SOLN
4.0000 mL | Freq: Two times a day (BID) | RESPIRATORY_TRACT | Status: AC
  Administered 2018-08-13: 4 mL via ORAL
  Filled 2018-08-13 (×4): qty 4

## 2018-08-13 NOTE — Progress Notes (Signed)
[place] at [facility]                                                                                                                                                                                  Patient Demographics   Debra [name], is a 84 y.o. female, DOB - 1933/05/01, BDZ:[id number]  Admit date - 08/12/2018   Admitting Physician Dustin Flock, MD  Outpatient Primary MD for the patient is Jodi Marble, MD   LOS - 1  Subjective: Patient's breathing has gotten worse she is on 6 L of oxygen now Complains of pain all over as well as shortness of breath   Review of Systems:   CONSTITUTIONAL: No documented fever. No fatigue, weakness. No weight gain, no weight loss.  EYES: No blurry or double vision.  ENT: No tinnitus. No postnasal drip. No redness of the oropharynx.  RESPIRATORY: Positive cough, no wheeze, no hemoptysis.  Positive dyspnea.  CARDIOVASCULAR: No chest pain. No orthopnea. No palpitations. No syncope.  GASTROINTESTINAL: No nausea, no vomiting or diarrhea. No abdominal pain. No melena or hematochezia.  GENITOURINARY: No dysuria or hematuria.  ENDOCRINE: No polyuria or nocturia. No heat or cold intolerance.  HEMATOLOGY: No anemia. No bruising. No bleeding.  INTEGUMENTARY: No rashes. No lesions.  MUSCULOSKELETAL: No arthritis. No swelling. No gout.  NEUROLOGIC: No numbness, tingling, or ataxia. No seizure-type activity.  PSYCHIATRIC: No anxiety. No insomnia. No ADD.    Vitals:   Vitals:   08/13/18 0241 08/13/18 0851 08/13/18 0909 08/13/18 1354  BP:  (!) 166/89    Pulse:  82 86 84  Resp:  18 18 18   Temp:  98.4 F (36.9 C)    TempSrc:  Oral    SpO2: 95% 98% 91% 93%  Weight:      Height:        Wt Readings from Last 3 Encounters:  08/12/18 76.9 kg  06/23/18 76.7 kg  01/25/18 74.4 kg     Intake/Output Summary (Last 24 hours) at 08/13/2018 1432 Last data filed at 08/13/2018 0557 Gross per 24 hour  Intake 485.04 ml   Output 600 ml  Net -114.96 ml    Physical Exam:   GENERAL: Positive accessory muscle usage HEAD, EYES, EARS, NOSE AND THROAT: Atraumatic, normocephalic. Extraocular muscles are intact. Pupils equal and reactive to light. Sclerae anicteric. No conjunctival injection. No oro-pharyngeal erythema.  NECK: Supple. There is no jugular venous distention. No bruits, no lymphadenopathy, no thyromegaly.  HEART: Regular rate and rhythm,. No murmurs, no rubs, no clicks.  LUNGS: Accessory muscle usage with diffuse wheezing rhonchus breath sounds ABDOMEN: Soft, flat, nontender, nondistended. Has good bowel sounds. No hepatosplenomegaly  appreciated.  EXTREMITIES: No evidence of any cyanosis, clubbing, or peripheral edema.  +2 pedal and radial pulses bilaterally.  NEUROLOGIC: The patient is alert, awake, and oriented x3 with no focal motor or sensory deficits appreciated bilaterally.  SKIN: Moist and warm with no rashes appreciated.  Psych: Not anxious, depressed LN: No inguinal LN enlargement    Antibiotics   Anti-infectives (From admission, onward)   Start     Dose/Rate Route Frequency Ordered Stop   08/14/18 0429  ceFEPIme (MAXIPIME) 2 g in sodium chloride 0.9 % 100 mL IVPB  Status:  Discontinued     2 g 200 mL/hr over 30 Minutes Intravenous Every 24 hours 08/13/18 0652 08/13/18 1238   08/13/18 2200  ceFEPIme (MAXIPIME) 2 g in sodium chloride 0.9 % 100 mL IVPB     2 g 200 mL/hr over 30 Minutes Intravenous Every 12 hours 08/13/18 1238     08/13/18 1700  vancomycin (VANCOCIN) IVPB 1000 mg/200 mL premix     1,000 mg 200 mL/hr over 60 Minutes Intravenous Every 18 hours 08/13/18 1238     08/13/18 0430  ceFEPIme (MAXIPIME) 2 g in sodium chloride 0.9 % 100 mL IVPB  Status:  Discontinued     2 g 200 mL/hr over 30 Minutes Intravenous Every 12 hours 08/12/18 1705 08/13/18 0652   08/12/18 2300  vancomycin (VANCOCIN) IVPB 1000 mg/200 mL premix  Status:  Discontinued     1,000 mg 200 mL/hr over 60  Minutes Intravenous Every 24 hours 08/12/18 1727 08/13/18 1238   08/12/18 1615  vancomycin (VANCOCIN) IVPB 1000 mg/200 mL premix     1,000 mg 200 mL/hr over 60 Minutes Intravenous  Once 08/12/18 1613 08/12/18 1808   08/12/18 1615  ceFEPIme (MAXIPIME) 2 g in sodium chloride 0.9 % 100 mL IVPB     2 g 200 mL/hr over 30 Minutes Intravenous  Once 08/12/18 1613 08/12/18 1704      Medications   Scheduled Meds: . acetylcysteine  4 mL Oral BID  . Chlorhexidine Gluconate Cloth  6 each Topical Q0600  . guaiFENesin  600 mg Oral BID  . ipratropium-albuterol  3 mL Nebulization Q6H  . methylPREDNISolone (SOLU-MEDROL) injection  60 mg Intravenous Q6H  . mupirocin ointment  1 application Nasal BID  . sodium chloride flush  3 mL Intravenous Q12H   Continuous Infusions: . sodium chloride    . ceFEPime (MAXIPIME) IV    . vancomycin     PRN Meds:.sodium chloride, acetaminophen **OR** acetaminophen, HYDROcodone-acetaminophen, ondansetron **OR** ondansetron (ZOFRAN) IV, sodium chloride flush   Data Review:   Micro Results Recent Results (from the past 240 hour(s))  MRSA PCR Screening     Status: Abnormal   Collection Time: 08/13/18  9:39 AM  Result Value Ref Range Status   MRSA by PCR POSITIVE (A) NEGATIVE Final    Comment:        The GeneXpert MRSA Assay (FDA approved for NASAL specimens only), is one component of a comprehensive MRSA colonization surveillance program. It is not intended to diagnose MRSA infection nor to guide or monitor treatment for MRSA infections. RESULT CALLED TO, READ BACK BY AND VERIFIED WITH: KRISTEN NELSON 08/13/18 118 KLW Performed at [facility], [address] [place], [place] [address]     Radiology Reports Dg Chest 2 View  Result Date: 08/12/2018 CLINICAL DATA:  Short of breath, history of COPD and asthma EXAM: CHEST - 2 VIEW COMPARISON:  Chest x-ray of 01/25/2018 FINDINGS: Cardiomegaly is present with  interstitial markings bilaterally and  probable small effusions most consistent with mild CHF. No focal pneumonia is seen. Loculated fluid cannot be excluded posteriorly at the lung base on the lateral view. Median sternotomy sutures are present from prior CABG and cardiomegaly is stable. Multiple thoracic vertebral augmentations are noted. IMPRESSION: 1. Cardiomegaly with prominent interstitial markings and small effusions most consistent with mild CHF. 2. Cannot exclude some loculated fluid posteriorly at the lung base on the lateral view. Electronically Signed   By: Ivar Drape M.D.   On: 08/12/2018 13:41   Dg Pelvis 1-2 Views  Result Date: 08/12/2018 CLINICAL DATA:  Chronic hip pain on the left, fell on October 2019 with pelvic fracture EXAM: PELVIS - 1-2 VIEW COMPARISON:  MR pelvis of 06/23/2018 FINDINGS: Fractures of the left inferior pubic ramus are identified which are subacute. No acute hip fracture is seen. Changes of avascular necrosis are present in the right femoral head with the left femoral head changes as noted by MR not as well visualized by plain film. The SI joints appear well corticated. Vertebral augmentation is noted at the L4 level. IMPRESSION: 1. No acute hip fracture. 2. Subacute fractures of the left inferior pubic ramus. 3. Avascular necrosis of the right femoral head as noted on recent MR. The right femoral head involvement of AVN noted by MR is not seen by plain film. Electronically Signed   By: Ivar Drape M.D.   On: 08/12/2018 14:56   Ct Angio Chest Pe W And/or Wo Contrast  Result Date: 08/12/2018 CLINICAL DATA:  Worsening shortness of breath, history of COPD and asthma, recent fall injuring the pelvis EXAM: CT ANGIOGRAPHY CHEST WITH CONTRAST TECHNIQUE: Multidetector CT imaging of the chest was performed using the standard protocol during bolus administration of intravenous contrast. Multiplanar CT image reconstructions and MIPs were obtained to evaluate the vascular anatomy. CONTRAST:  15mL OMNIPAQUE IOHEXOL 350  MG/ML SOLN COMPARISON:  Chest x-ray of 08/12/2018 FINDINGS: Cardiovascular: The pulmonary arteries are well opacified. There is no evidence of pulmonary embolism. There is faint opacification of the thoracic aorta with moderate thoracic aortic atherosclerosis present. Median sternotomy sutures are noted from prior CABG and cardiomegaly is present. No pericardial effusion is noted. Mediastinum/Nodes: There is evidence of mediastinal and hilar adenopathy particularly in the right infrahilar region and within the subcarinal mediastinum. Subcarinal adenopathy measures 24 mm. A pretracheal node on the right measures 11 mm with a right infrahilar node of 11 mm. Slightly prominent prevascular nodes also are present. A superior mediastinal lymph node on image 44 series 5 measures 12 mm. The thyroid gland is unremarkable. The thoracic esophagus is unremarkable. Lungs/Pleura: On lung window images, there is relatively severe centrilobular emphysema present with some paraseptal emphysema as well. Peripheral subpleural reticulation is present consistent with scarring. However, within the right lower lobe there is a rounded mass of 5.6 x 3.8 cm with a height 4.9 cm consistent with primary lung carcinoma. This in retrospect is vaguely visualized on chest x-ray today, superimposed upon chronic change and probable mild edema. There is some narrowing of the right lower lobe pulmonary artery branches which traverse this mass. There is postobstructive pneumonitis within the right lower lobe. Bilateral pleural effusions are present left larger than right. There is parenchymal opacity within the left lower lobe which could be related to atelectasis and scarring, but metastatic involvement of the left lower lobe cannot be excluded. With cardiomegaly, small effusions, and prominent interstitium, mild superimposed interstitial edema is a consideration. The opacity  questioned on lateral chest x-ray posteriorly may represent loculated fluid.  Upper Abdomen: On this CT of the chest, no hepatic lesion is seen. In view of the mass in the right lower lobe by CT, CT the abdomen pelvis with IV contrast may be helpful. The portion of the upper abdomen that is visualized is unremarkable. Musculoskeletal: There is slight curvature of the thoracic vertebrae conscious next to the right and several mid and lower thoracic vertebroplasties have been performed. Review of the MIP images confirms the above findings. IMPRESSION: 1. No evidence of acute pulmonary embolism. 2. However, there is a rounded mass of 5.6 x 3.8 x 4.9 cm within the right lower lobe consistent with primary lung carcinoma with postobstructive pneumonitis present. Mediastinal adenopathy. 3. Opacities in the posterior left lower lobe could represent pneumonia or possibly metastatic involvement of the left lung. 4. Small effusions left-greater-than-right with cardiomegaly and somewhat prominent interstitial markings may represent mild interstitial edema superimposed on chronic change. 5. Centrilobular and to a lesser degree paraseptal emphysema. Electronically Signed   By: Ivar Drape M.D.   On: 08/12/2018 15:46     CBC Recent Labs  Lab 08/12/18 1254 08/13/18 0435  WBC 8.5 6.9  HGB 10.5* 10.1*  HCT 33.9* 33.2*  PLT 236 228  MCV 105.3* 106.1*  MCH 32.6 32.3  MCHC 31.0 30.4  RDW 15.0 15.0    Chemistries  Recent Labs  Lab 08/12/18 1254 08/13/18 0435  NA 140 138  K 4.3 4.3  CL 98 101  CO2 33* 33*  GLUCOSE 117* 170*  BUN 14 13  CREATININE 1.06* 0.89  CALCIUM 9.3 8.9   ------------------------------------------------------------------------------------------------------------------ estimated creatinine clearance is 44.9 mL/min (by C-G formula based on SCr of 0.89 mg/dL). ------------------------------------------------------------------------------------------------------------------ No results for input(s): HGBA1C in the last 72  hours. ------------------------------------------------------------------------------------------------------------------ No results for input(s): CHOL, HDL, LDLCALC, TRIG, CHOLHDL, LDLDIRECT in the last 72 hours. ------------------------------------------------------------------------------------------------------------------ No results for input(s): TSH, T4TOTAL, T3FREE, THYROIDAB in the last 72 hours.  Invalid input(s): FREET3 ------------------------------------------------------------------------------------------------------------------ Recent Labs    08/13/18 1419  RETICCTPCT 2.8    Coagulation profile Recent Labs  Lab 08/12/18 1254  INR 1.07    No results for input(s): DDIMER in the last 72 hours.  Cardiac Enzymes Recent Labs  Lab 08/12/18 1254  TROPONINI <0.03   ------------------------------------------------------------------------------------------------------------------ Invalid input(s): POCBNP    Assessment & Plan   IMPRESSION AND PLAN: Patient is 84 year old with COPD presenting with worsening shortness of breath  1.  Acute on chronic respiratory failure this is related to right lower lung mass associated with some pneumonia: Patient symptoms worse more oxygen now I discussed the case with oncology they do not feel that getting a biopsy will change her current management, discussed with the patient and son explained them that pulmonary status is worst, and biopsy can cause further complications Will ask palliative care team to see patient leaning towards hospice home  2.  Postobstructive pneumonia continue current antibiotic with IV vancomycin and cefepime  3.  Acute on chronic COPD exacerbation we will treat with nebulizer steroids and antibiotics as above  4.  Hyperlipidemia continue Lipitor  5.  Coronary artery disease continue Plavix and Lopressor  6.  Miscellaneous SCDs for DVT prophylaxis      Code Status Orders  (From admission,  onward)         Start     Ordered   08/12/18 2015  Do not attempt resuscitation (DNR)  Continuous    Question Answer Comment  In  the event of cardiac or respiratory ARREST Do not call a "code blue"   In the event of cardiac or respiratory ARREST Do not perform Intubation, CPR, defibrillation or ACLS   In the event of cardiac or respiratory ARREST Use medication by any route, position, wound care, and other measures to relive pain and suffering. May use oxygen, suction and manual treatment of airway obstruction as needed for comfort.      08/12/18 2014        Code Status History    Date Active Date Inactive Code Status Order ID Comments User Context   06/23/2018 1846 06/25/2018 2120 DNR [id number]  [name], MD Inpatient   01/25/2018 1810 01/27/2018 1940 DNR [id number]  [name], MD Inpatient   12/22/2017 1631 12/28/2017 2218 DNR [id number]  Demetrios Loll, MD Inpatient   12/05/2016 1102 12/08/2016 1724 DNR [id number]  Hillary Bow, MD ED   10/05/2016 2302 10/10/2016 2354 Full Code [id number]  Lance Coon, MD Inpatient   06/10/2016 1032 06/10/2016 1050 Full Code [id number]  Theodoro Grist, MD Inpatient   09/14/2015 2019 09/16/2015 2034 Full Code [id number]  Theodoro Grist, MD Inpatient   08/27/2015 0805 09/01/2015 2042 Full Code [id number]  Harrie Foreman, MD Inpatient           Consults oncology  DVT Prophylaxis  Lovenox  Lab Results  Component Value Date   PLT 228 08/13/2018     Time Spent in minutes   35 minutes of critical care time spent patient with worsening respiratory failure greater than 50% of time spent in care coordination and counseling patient regarding the condition and plan of care.   Dustin Flock M.D on 08/13/2018 at 2:32 PM  Between 7am to 6pm - Pager - [phone]  After 6pm go to [email or web address] - Proofreader  [employer]   Office  [phone]

## 2018-08-13 NOTE — Care Management Note (Signed)
Case Management Note  Patient Details  Name: Debra [name] MRN: [id number] Date of Birth: 1933-07-28  Subjective/Objective:                   RNCM met with patient, her son Major and daughter Dot to discuss discharge planning. Patient was requesting to eat as she was NPO for lung biopsy which has been cancelled due to O2 demand (per patient).  She is currently requiring 6 liters of O2 per daughter.  Patient states she wants to go to [facility]. Patient has limited caregivers in the home.  Action/Plan:   MD notified; palliative pending. Home health list also provided.   Expected Discharge Date:                  Expected Discharge Plan:     In-House Referral:     Discharge planning Services     Post Acute Care Choice:    Choice offered to:     DME Arranged:    DME Agency:     HH Arranged:    HH Agency:     Status of Service:     If discussed at [employer], dates discussed:    Additional Comments:  Marshell Garfinkel, RN 08/13/2018, 1:42 PM

## 2018-08-13 NOTE — Telephone Encounter (Signed)
Spoke to daughter- Daughter Cell- [phone].

## 2018-08-13 NOTE — Consult Note (Signed)
Pharmacy Antibiotic Note  Debra [name] is a 84 y.o. female admitted on 08/12/2018 with pneumonia.  Pharmacy has been consulted for Vancomycin and Cefepime dosing. SCr has improved since admission back to apparent baseline. He is currently on 6L [place] O2 with sats in the low 90s  Plan: Increase vancomycin 1000mg  IV every 18 hours.  Goal trough 15-20 mcg/mL.  Increase cefepime to 2g q 12h   Will monitor Scr daily and check Vancomycin trough prior to the 4th dose   Ke = 0.048, T1/2 = 15h, Vd = 52 L  Height: 5\' 1"  (154.9 cm) Weight: 169 lb 9.6 oz (76.9 kg) IBW/kg (Calculated) : 47.8  Temp (24hrs), Avg:97.9 F (36.6 C), Min:97.7 F (36.5 C), Max:98.4 F (36.9 C)  Recent Labs  Lab 08/12/18 1254 08/13/18 0435  WBC 8.5 6.9  CREATININE 1.06* 0.89    Estimated Creatinine Clearance: 44.9 mL/min (by C-G formula based on SCr of 0.89 mg/dL).    Allergies  Allergen Reactions  . Codeine Itching and Rash    Antimicrobials this admission: Vancomycin 12/03 >>   Cefepime 12/03 >>   Dose adjustments this admission: None at this time  Microbiology results: No current cultures  Thank you for allowing pharmacy to be a part of this patient's care.  Dallie Piles, PharmD Clinical Pharmacist 08/13/2018 12:22 PM

## 2018-08-13 NOTE — Consult Note (Signed)
[place] CONSULT NOTE  Patient Care Team: Jodi Marble, MD as PCP - General (Internal Medicine) Alisa Graff, FNP as Nurse Practitioner (Family Medicine) Dionisio David, MD as Consulting Physician (Cardiology) Laverle Hobby, MD as Consulting Physician (Pulmonary Disease)  CHIEF COMPLAINTS/PURPOSE OF CONSULTATION:  Lung mass  HISTORY OF PRESENTING ILLNESS:  Debra [name] 84 y.o.  female long-standing history of smoking [quit approximately year ago]; COPD on 3 to 4 L of oxygen at baseline; chronic CHF/CAD is currently admitted to the hospital for worsening shortness of breath.  Of note patient was recently admitted the hospital for hip fracture after which she was discharged to a rehab.  Patient currently lives with her granddaughter.  Her daughter Shawnese is involved in her care.  Patient states that in the last few days her shortness of breath got worse.  Denies any fevers.  No hemoptysis.  Positive for cough sputum.  In general patient has limited functional status resting most that time.  ROS: Appetite is fair.  No significant weight loss.  No headaches.  A complete 10 point review of system is done which is negative except mentioned above in history of present illness  MEDICAL HISTORY:  Past Medical History:  Diagnosis Date  . Asthma   . CHF (congestive heart failure) ([place])   . COPD (chronic obstructive pulmonary disease) ([place])   . Myocardial infarction [facility])     SURGICAL HISTORY: Past Surgical History:  Procedure Laterality Date  . ABDOMINAL HYSTERECTOMY    . BREAST SURGERY    . BYPASS GRAFT  2007   triple  . CHOLECYSTECTOMY    . CORONARY ARTERY BYPASS GRAFT    . TONSILLECTOMY      SOCIAL HISTORY: Social History   Socioeconomic History  . Marital status: Widowed    Spouse name: Not on file  . Number of children: Not on file  . Years of education: Not on file  . Highest education level: Not on file  Occupational History  .  Not on file  Social Needs  . Financial resource strain: Not on file  . Food insecurity:    Worry: Not on file    Inability: Not on file  . Transportation needs:    Medical: Not on file    Non-medical: Not on file  Tobacco Use  . Smoking status: Former Smoker    Packs/day: 0.50    Years: 55.00    Pack years: 27.50    Last attempt to quit: 11/21/2017    Years since quitting: 0.7  . Smokeless tobacco: Never Used  Substance and Sexual Activity  . Alcohol use: No  . Drug use: No  . Sexual activity: Not on file  Lifestyle  . Physical activity:    Days per week: Not on file    Minutes per session: Not on file  . Stress: Not on file  Relationships  . Social connections:    Talks on phone: Not on file    Gets together: Not on file    Attends religious service: Not on file    Active member of club or organization: Not on file    Attends meetings of clubs or organizations: Not on file    Relationship status: Not on file  . Intimate partner violence:    Fear of current or ex partner: Not on file    Emotionally abused: Not on file    Physically abused: Not on file    Forced sexual activity: Not  on file  Other Topics Concern  . Not on file  Social History Narrative  . Not on file    FAMILY HISTORY: Family History  Problem Relation Age of Onset  . CAD Father     ALLERGIES:  is allergic to codeine.  MEDICATIONS:  Current Facility-Administered Medications  Medication Dose Route Frequency Provider Last Rate Last Dose  . 0.9 %  sodium chloride infusion  250 mL Intravenous PRN Dustin Flock, MD      . acetaminophen (TYLENOL) tablet 650 mg  650 mg Oral Q6H PRN Dustin Flock, MD       Or  . acetaminophen (TYLENOL) suppository 650 mg  650 mg Rectal Q6H PRN Dustin Flock, MD      . acetylcysteine (MUCOMYST) 20 % nebulizer / oral solution 4 mL  4 mL Oral BID Dustin Flock, MD      . ceFEPIme (MAXIPIME) 2 g in sodium chloride 0.9 % 100 mL IVPB  2 g Intravenous Q12H Dallie Piles, RPH      . Chlorhexidine Gluconate Cloth 2 % PADS 6 each  6 each Topical Q0600 Dustin Flock, MD      . guaiFENesin (MUCINEX) 12 hr tablet 600 mg  600 mg Oral BID Dustin Flock, MD   600 mg at 08/13/18 0926  . HYDROcodone-acetaminophen (NORCO/VICODIN) 5-325 MG per tablet 1 tablet  1 tablet Oral Q4H PRN Lance Coon, MD   1 tablet at 08/13/18 [phone]  . ipratropium-albuterol (DUONEB) 0.5-2.5 (3) MG/3ML nebulizer solution 3 mL  3 mL Nebulization Q6H Dustin Flock, MD   3 mL at 08/13/18 0908  . methylPREDNISolone sodium succinate (SOLU-MEDROL) 125 mg/2 mL injection 60 mg  60 mg Intravenous Q6H Dustin Flock, MD   60 mg at 08/13/18 0932  . mupirocin ointment (BACTROBAN) 2 % 1 application  1 application Nasal BID Dustin Flock, MD      . ondansetron [facility]) tablet 4 mg  4 mg Oral Q6H PRN Dustin Flock, MD       Or  . ondansetron (ZOFRAN) injection 4 mg  4 mg Intravenous Q6H PRN Dustin Flock, MD      . sodium chloride flush (NS) 0.9 % injection 3 mL  3 mL Intravenous Q12H Dustin Flock, MD   3 mL at 08/13/18 0926  . sodium chloride flush (NS) 0.9 % injection 3 mL  3 mL Intravenous PRN Dustin Flock, MD      . vancomycin (VANCOCIN) IVPB 1000 mg/200 mL premix  1,000 mg Intravenous Q18H Dallie Piles, RPH          .  PHYSICAL EXAMINATION:  Vitals:   08/13/18 0851 08/13/18 0909  BP: (!) 166/89   Pulse: 82 86  Resp: 18 18  Temp: 98.4 F (36.9 C)   SpO2: 98% 91%   Filed Weights   08/12/18 1248 08/12/18 2033  Weight: 164 lb (74.4 kg) 169 lb 9.6 oz (76.9 kg)    Physical Exam  Constitutional: She is oriented to person, place, and time and well-developed, well-nourished, and in no distress.  Elderly female patient.  Patient is on 6 L of oxygen.  Alone.  HENT:  Head: Normocephalic and atraumatic.  Mouth/Throat: Oropharynx is clear and moist. No oropharyngeal exudate.  Eyes: Pupils are equal, round, and reactive to light.  Neck: Normal range of motion. Neck  supple.  Cardiovascular: Normal rate and regular rhythm.  Pulmonary/Chest: No respiratory distress. She has no wheezes.  Decreased air entry bilaterally.  Abdominal: Soft. Bowel sounds are  normal. She exhibits no distension and no mass. There is no tenderness. There is no rebound and no guarding.  Musculoskeletal: Normal range of motion. She exhibits no edema or tenderness.  Neurological: She is alert and oriented to person, place, and time.  Skin: Skin is warm.  Psychiatric: Affect normal.     LABORATORY DATA:  I have reviewed the data as listed Lab Results  Component Value Date   WBC 6.9 08/13/2018   HGB 10.1 (L) 08/13/2018   HCT 33.2 (L) 08/13/2018   MCV 106.1 (H) 08/13/2018   PLT 228 08/13/2018   Recent Labs    10/22/17 1047  12/22/17 1305  06/24/18 0217 08/12/18 1254 08/13/18 0435  NA 138   < > 133*   < > 140 140 138  K 4.7   < > 3.9   < > 4.2 4.3 4.3  CL 99*   < > 95*   < > 105 98 101  CO2 31   < > 30   < > 30 33* 33*  GLUCOSE 108*   < > 122*   < > 110* 117* 170*  BUN 24*   < > 29*   < > 16 14 13   CREATININE 1.25*   < > 1.14*   < > 1.23* 1.06* 0.89  CALCIUM 9.3   < > 8.2*   < > 8.3* 9.3 8.9  GFRNONAA 39*   < > 43*   < > 39* 49* 60*  GFRAA 45*   < > 50*   < > 46* 56* >60  PROT 7.4  --  6.8  --   --   --   --   ALBUMIN 3.9  --  3.6  --   --   --   --   AST 23  --  23  --   --   --   --   ALT 11*  --  12*  --   --   --   --   ALKPHOS 53  --  62  --   --   --   --   BILITOT 0.8  --  0.9  --   --   --   --   BILIDIR <0.1*  --   --   --   --   --   --   IBILI NOT CALCULATED  --   --   --   --   --   --    < > = values in this interval not displayed.    RADIOGRAPHIC STUDIES: I have personally reviewed the radiological images as listed and agreed with the findings in the report. Dg Chest 2 View  Result Date: 08/12/2018 CLINICAL DATA:  Short of breath, history of COPD and asthma EXAM: CHEST - 2 VIEW COMPARISON:  Chest x-ray of 01/25/2018 FINDINGS: Cardiomegaly is  present with interstitial markings bilaterally and probable small effusions most consistent with mild CHF. No focal pneumonia is seen. Loculated fluid cannot be excluded posteriorly at the lung base on the lateral view. Median sternotomy sutures are present from prior CABG and cardiomegaly is stable. Multiple thoracic vertebral augmentations are noted. IMPRESSION: 1. Cardiomegaly with prominent interstitial markings and small effusions most consistent with mild CHF. 2. Cannot exclude some loculated fluid posteriorly at the lung base on the lateral view. Electronically Signed   By: Ivar Drape M.D.   On: 08/12/2018 13:41   Dg Pelvis 1-2 Views  Result Date: 08/12/2018 CLINICAL DATA:  Chronic hip pain on the left, fell on October 2019 with pelvic fracture EXAM: PELVIS - 1-2 VIEW COMPARISON:  MR pelvis of 06/23/2018 FINDINGS: Fractures of the left inferior pubic ramus are identified which are subacute. No acute hip fracture is seen. Changes of avascular necrosis are present in the right femoral head with the left femoral head changes as noted by MR not as well visualized by plain film. The SI joints appear well corticated. Vertebral augmentation is noted at the L4 level. IMPRESSION: 1. No acute hip fracture. 2. Subacute fractures of the left inferior pubic ramus. 3. Avascular necrosis of the right femoral head as noted on recent MR. The right femoral head involvement of AVN noted by MR is not seen by plain film. Electronically Signed   By: Ivar Drape M.D.   On: 08/12/2018 14:56   Ct Angio Chest Pe W And/or Wo Contrast  Result Date: 08/12/2018 CLINICAL DATA:  Worsening shortness of breath, history of COPD and asthma, recent fall injuring the pelvis EXAM: CT ANGIOGRAPHY CHEST WITH CONTRAST TECHNIQUE: Multidetector CT imaging of the chest was performed using the standard protocol during bolus administration of intravenous contrast. Multiplanar CT image reconstructions and MIPs were obtained to evaluate the  vascular anatomy. CONTRAST:  46mL OMNIPAQUE IOHEXOL 350 MG/ML SOLN COMPARISON:  Chest x-ray of 08/12/2018 FINDINGS: Cardiovascular: The pulmonary arteries are well opacified. There is no evidence of pulmonary embolism. There is faint opacification of the thoracic aorta with moderate thoracic aortic atherosclerosis present. Median sternotomy sutures are noted from prior CABG and cardiomegaly is present. No pericardial effusion is noted. Mediastinum/Nodes: There is evidence of mediastinal and hilar adenopathy particularly in the right infrahilar region and within the subcarinal mediastinum. Subcarinal adenopathy measures 24 mm. A pretracheal node on the right measures 11 mm with a right infrahilar node of 11 mm. Slightly prominent prevascular nodes also are present. A superior mediastinal lymph node on image 44 series 5 measures 12 mm. The thyroid gland is unremarkable. The thoracic esophagus is unremarkable. Lungs/Pleura: On lung window images, there is relatively severe centrilobular emphysema present with some paraseptal emphysema as well. Peripheral subpleural reticulation is present consistent with scarring. However, within the right lower lobe there is a rounded mass of 5.6 x 3.8 cm with a height 4.9 cm consistent with primary lung carcinoma. This in retrospect is vaguely visualized on chest x-ray today, superimposed upon chronic change and probable mild edema. There is some narrowing of the right lower lobe pulmonary artery branches which traverse this mass. There is postobstructive pneumonitis within the right lower lobe. Bilateral pleural effusions are present left larger than right. There is parenchymal opacity within the left lower lobe which could be related to atelectasis and scarring, but metastatic involvement of the left lower lobe cannot be excluded. With cardiomegaly, small effusions, and prominent interstitium, mild superimposed interstitial edema is a consideration. The opacity questioned on  lateral chest x-ray posteriorly may represent loculated fluid. Upper Abdomen: On this CT of the chest, no hepatic lesion is seen. In view of the mass in the right lower lobe by CT, CT the abdomen pelvis with IV contrast may be helpful. The portion of the upper abdomen that is visualized is unremarkable. Musculoskeletal: There is slight curvature of the thoracic vertebrae conscious next to the right and several mid and lower thoracic vertebroplasties have been performed. Review of the MIP images confirms the above findings. IMPRESSION: 1. No evidence of acute pulmonary embolism. 2. However, there is a rounded mass of 5.6  x 3.8 x 4.9 cm within the right lower lobe consistent with primary lung carcinoma with postobstructive pneumonitis present. Mediastinal adenopathy. 3. Opacities in the posterior left lower lobe could represent pneumonia or possibly metastatic involvement of the left lung. 4. Small effusions left-greater-than-right with cardiomegaly and somewhat prominent interstitial markings may represent mild interstitial edema superimposed on chronic change. 5. Centrilobular and to a lesser degree paraseptal emphysema. Electronically Signed   By: Ivar Drape M.D.   On: 08/12/2018 15:46    ASSESSMENT & PLAN:   #84 year old female patient with long-standing history of smoking/COPD CHF currently admitted to hospital for worsening shortness of breath noted to have incidental lung mass right lower lobe  #Right lower lobe lung mass approximately 5 cm in size with postobstructive pneumonia mediastinal adenopathy highly suspicious for malignancy question involvement of the contralateral lung.  See discussion below.   #Chronic respiratory failure/worsening-COPD/CHF as per primary team.  #Mild anemia hemoglobin on 10-macrocytosis.  Check U19 folic acid LDH reticulocyte count.  #Agree with DNR/DNI; agree with palliative care evaluation.  #Plan: I had a long discussion with the patient and her daughter over the  phone [place]-regarding the imaging findings highly suspicious for cancer of the lung unless proven otherwise.  Patient will need a biopsy/tissue diagnosis for further confirmation.  However given her tenuous respiratory status/poor baseline lung function patient would be high risk complications from procedure.  Also patient is not a great candidate for any chemotherapy/radiation therapy given her multiple comorbidities/poor performance status.  However for now recommend would continue treating the acute issues; plan outpatient PET scan.  And then a potential biopsy based upon results of the PET scan-if patient/family interested.  Discussed with Dr.Henn from interventional radiology.  Also discussed with Dr. Posey Pronto  Thank you Dr.Patel for allowing me to participate in the care of your pleasant patient. Please do not hesitate to contact me with questions or concerns in the interim. ll questions were answered. The patient knows to call the clinic with any problems, questions or concerns.  # I reviewed the blood work- with the patient in detail; also reviewed the imaging independently [as summarized above]; and with the patient in detail.   Cc; Hayley/ Dr.Tejan-Sie    Cammie Sickle, MD 08/13/2018 1:10 PM

## 2018-08-14 DIAGNOSIS — J189 Pneumonia, unspecified organism: Secondary | ICD-10-CM

## 2018-08-14 DIAGNOSIS — R918 Other nonspecific abnormal finding of lung field: Secondary | ICD-10-CM

## 2018-08-14 DIAGNOSIS — Z515 Encounter for palliative care: Secondary | ICD-10-CM

## 2018-08-14 LAB — CBC
HEMATOCRIT: 29.5 % — AB (ref 36.0–46.0)
Hemoglobin: 9.3 g/dL — ABNORMAL LOW (ref 12.0–15.0)
MCH: 32.5 pg (ref 26.0–34.0)
MCHC: 31.5 g/dL (ref 30.0–36.0)
MCV: 103.1 fL — ABNORMAL HIGH (ref 80.0–100.0)
Platelets: 246 10*3/uL (ref 150–400)
RBC: 2.86 MIL/uL — ABNORMAL LOW (ref 3.87–5.11)
RDW: 15 % (ref 11.5–15.5)
WBC: 13.6 10*3/uL — AB (ref 4.0–10.5)
nRBC: 0 % (ref 0.0–0.2)

## 2018-08-14 LAB — HEPATIC FUNCTION PANEL
ALK PHOS: 80 U/L (ref 38–126)
ALT: 7 U/L (ref 0–44)
AST: 15 U/L (ref 15–41)
Albumin: 3.3 g/dL — ABNORMAL LOW (ref 3.5–5.0)
Bilirubin, Direct: <0.1 mg/dL (ref 0.0–0.2)
TOTAL PROTEIN: 7.3 g/dL (ref 6.5–8.1)
Total Bilirubin: 0.6 mg/dL (ref 0.3–1.2)

## 2018-08-14 LAB — BASIC METABOLIC PANEL
Anion gap: 5 (ref 5–15)
BUN: 20 mg/dL (ref 8–23)
CO2: 34 mmol/L — ABNORMAL HIGH (ref 22–32)
Calcium: 8.9 mg/dL (ref 8.9–10.3)
Chloride: 100 mmol/L (ref 98–111)
Creatinine, Ser: 0.9 mg/dL (ref 0.44–1.00)
GFR calc Af Amer: >60 mL/min (ref >60)
GFR calc non Af Amer: 59 mL/min — ABNORMAL LOW (ref >60)
Glucose, Bld: 159 mg/dL — ABNORMAL HIGH (ref 70–99)
Potassium: 4.2 mmol/L (ref 3.5–5.1)
Sodium: 139 mmol/L (ref 135–145)

## 2018-08-14 MED ORDER — BISACODYL 10 MG RE SUPP
10.0000 mg | Freq: Every day | RECTAL | Status: DC | PRN
  Administered 2018-08-15: 10 mg via RECTAL
  Filled 2018-08-14: qty 1

## 2018-08-14 MED ORDER — HYDRALAZINE HCL 20 MG/ML IJ SOLN
10.0000 mg | Freq: Four times a day (QID) | INTRAMUSCULAR | Status: DC | PRN
  Administered 2018-08-14 – 2018-08-16 (×2): 10 mg via INTRAVENOUS
  Filled 2018-08-14 (×2): qty 1

## 2018-08-14 MED ORDER — ALPRAZOLAM 0.5 MG PO TABS
0.5000 mg | ORAL_TABLET | Freq: Two times a day (BID) | ORAL | Status: DC | PRN
  Administered 2018-08-14 – 2018-08-16 (×4): 0.5 mg via ORAL
  Filled 2018-08-14 (×4): qty 1

## 2018-08-14 NOTE — Consult Note (Signed)
[place]  Telephone:([phone] [phone]   Name: Debra [name] Date: 08/14/2018 MRN: [id number]  DOB: 1933/07/12  Patient Care Team: Jodi Marble, MD as PCP - General (Internal Medicine) Alisa Graff, FNP as Nurse Practitioner (Family Medicine) Dionisio David, MD as Consulting Physician (Cardiology) Laverle Hobby, MD as Consulting Physician (Pulmonary Disease) Telford Nab, RN as Registered Nurse    REASON FOR CONSULTATION: Palliative Care consult requested for this 84 y.o. female with multiple medical problems including O2 dependent COPD, chronic diastolic dysfunction with history of CHF, CAD, who was admitted on 08/12/2018 with acute on chronic respiratory failure.  CTA of the chest revealed right lower lung mass with probable postobstructive pneumonia.  Patient has been evaluated by oncology.  She is felt not to be a candidate for aggressive treatment.  Palliative care was consulted to help address goals.   SOCIAL HISTORY:    Patient is widowed.  She has 2 sons and a daughter.  Patient had 2 other daughters that are now deceased.  Patient formally worked in [employer].  She lived with a friend prior to this hospitalization.  ADVANCE DIRECTIVES:  Does not have  CODE STATUS: DNR  PAST MEDICAL HISTORY: Past Medical History:  Diagnosis Date  . Asthma   . CHF (congestive heart failure) ([place])   . COPD (chronic obstructive pulmonary disease) ([facility])   . Myocardial infarction [facility])     PAST SURGICAL HISTORY:  Past Surgical History:  Procedure Laterality Date  . ABDOMINAL HYSTERECTOMY    . BREAST SURGERY    . BYPASS GRAFT  2007   triple  . CHOLECYSTECTOMY    . CORONARY ARTERY BYPASS GRAFT    . TONSILLECTOMY      HEMATOLOGY/ONCOLOGY HISTORY:   No history exists.    ALLERGIES:  is allergic to codeine.  MEDICATIONS:  Current Facility-Administered Medications  Medication Dose Route Frequency  Provider Last Rate Last Dose  . 0.9 %  sodium chloride infusion  250 mL Intravenous PRN Dustin Flock, MD      . acetaminophen (TYLENOL) tablet 650 mg  650 mg Oral Q6H PRN Dustin Flock, MD       Or  . acetaminophen (TYLENOL) suppository 650 mg  650 mg Rectal Q6H PRN Dustin Flock, MD      . acetylcysteine (MUCOMYST) 20 % nebulizer / oral solution 4 mL  4 mL Oral BID Dustin Flock, MD   4 mL at 08/13/18 2125  . ALPRAZolam Duanne Moron) tablet 0.5 mg  0.5 mg Oral BID PRN Borders, Kirt Boys, NP      . ceFEPIme (MAXIPIME) 2 g in sodium chloride 0.9 % 100 mL IVPB  2 g Intravenous Q12H Dallie Piles, RPH 200 mL/hr at 08/14/18 1039 2 g at 08/14/18 1039  . Chlorhexidine Gluconate Cloth 2 % PADS 6 each  6 each Topical Q0600 Dustin Flock, MD   6 each at 08/14/18 0622  . guaiFENesin (MUCINEX) 12 hr tablet 600 mg  600 mg Oral BID Dustin Flock, MD   600 mg at 08/14/18 1032  . hydrALAZINE (APRESOLINE) injection 10 mg  10 mg Intravenous Q6H PRN Dustin Flock, MD      . HYDROcodone-acetaminophen (NORCO/VICODIN) 5-325 MG per tablet 1 tablet  1 tablet Oral Q4H PRN Lance Coon, MD   1 tablet at 08/14/18 1031  . ipratropium-albuterol (DUONEB) 0.5-2.5 (3) MG/3ML nebulizer solution 3 mL  3 mL Nebulization Q6H Dustin Flock, MD   3 mL at  08/14/18 9147  . methylPREDNISolone sodium succinate (SOLU-MEDROL) 125 mg/2 mL injection 60 mg  60 mg Intravenous Q6H Dustin Flock, MD   60 mg at 08/14/18 1244  . morphine 2 MG/ML injection 2 mg  2 mg Intravenous Q4H PRN Dustin Flock, MD      . mupirocin ointment (BACTROBAN) 2 % 1 application  1 application Nasal BID Dustin Flock, MD   1 application at 82/95/62 1032  . ondansetron (ZOFRAN) tablet 4 mg  4 mg Oral Q6H PRN Dustin Flock, MD       Or  . ondansetron [facility]) injection 4 mg  4 mg Intravenous Q6H PRN Dustin Flock, MD      . sodium chloride flush (NS) 0.9 % injection 3 mL  3 mL Intravenous Q12H Dustin Flock, MD   3 mL at 08/14/18 1032  . sodium  chloride flush (NS) 0.9 % injection 3 mL  3 mL Intravenous PRN Dustin Flock, MD      . vancomycin (VANCOCIN) IVPB 1000 mg/200 mL premix  1,000 mg Intravenous Q18H Dallie Piles, RPH 200 mL/hr at 08/14/18 1247 1,000 mg at 08/14/18 1247    VITAL SIGNS: BP (!) 137/101 (BP Location: Left Arm)   Pulse 78   Temp 98 F (36.7 C) (Oral)   Resp (!) 22   Ht '5\' 1"'  (1.549 m)   Wt 169 lb 9.6 oz (76.9 kg)   SpO2 99%   BMI 32.05 kg/m  Filed Weights   08/12/18 1248 08/12/18 2033  Weight: 164 lb (74.4 kg) 169 lb 9.6 oz (76.9 kg)    Estimated body mass index is 32.05 kg/m as calculated from the following:   Height as of this encounter: '5\' 1"'  (1.549 m).   Weight as of this encounter: 169 lb 9.6 oz (76.9 kg).  LABS: CBC:    Component Value Date/Time   WBC 13.6 (H) 08/14/2018 0321   HGB 9.3 (L) 08/14/2018 0321   HGB 11.7 (L) 09/14/2014 0416   HGB 14.4 07/24/2007 0904   HCT 29.5 (L) 08/14/2018 0321   HCT 35.0 09/14/2014 0416   HCT 42.3 07/24/2007 0904   PLT 246 08/14/2018 0321   PLT 159 09/14/2014 0416   PLT 259 07/24/2007 0904   MCV 103.1 (H) 08/14/2018 0321   MCV 107 (H) 09/14/2014 0416   MCV 104 (H) 07/24/2007 0904   NEUTROABS 5.4 06/23/2018 1118   NEUTROABS 4.0 09/14/2014 0416   NEUTROABS 3.7 07/24/2007 0904   LYMPHSABS 1.7 06/23/2018 1118   LYMPHSABS 0.7 (L) 09/14/2014 0416   LYMPHSABS 2.9 07/24/2007 0904   MONOABS 0.5 06/23/2018 1118   MONOABS 0.0 (L) 09/14/2014 0416   EOSABS 0.7 (H) 06/23/2018 1118   EOSABS 0.0 09/14/2014 0416   EOSABS 0.2 07/24/2007 0904   BASOSABS 0.0 06/23/2018 1118   BASOSABS 0.0 09/14/2014 0416   BASOSABS 0.0 07/24/2007 0904   Comprehensive Metabolic Panel:    Component Value Date/Time   NA 139 08/14/2018 0321   NA 140 09/17/2014 0506   NA 136 12/03/2006 1253   K 4.2 08/14/2018 0321   K 3.8 09/17/2014 0506   K 5.2 (H) 12/03/2006 1253   CL 100 08/14/2018 0321   CL 98 09/17/2014 0506   CL 102 12/03/2006 1253   CO2 34 (H) 08/14/2018 0321    CO2 41 (HH) 09/17/2014 0506   CO2 32 12/03/2006 1253   BUN 20 08/14/2018 0321   BUN 20 (H) 09/17/2014 0506   BUN 19 12/03/2006 1253   CREATININE 0.90  08/14/2018 0321   CREATININE 1.09 09/17/2014 0506   CREATININE 1.0 12/03/2006 1253   GLUCOSE 159 (H) 08/14/2018 0321   GLUCOSE 97 09/17/2014 0506   GLUCOSE 101 12/03/2006 1253   CALCIUM 8.9 08/14/2018 0321   CALCIUM 8.6 09/17/2014 0506   CALCIUM 9.1 12/03/2006 1253   AST 15 08/14/2018 0321   AST 20 09/13/2014 1541   ALT 7 08/14/2018 0321   ALT 14 09/13/2014 1541   ALKPHOS 80 08/14/2018 0321   ALKPHOS 49 09/13/2014 1541   BILITOT 0.6 08/14/2018 0321   BILITOT 0.4 09/13/2014 1541   PROT 7.3 08/14/2018 0321   PROT 6.2 (L) 09/13/2014 1541   ALBUMIN 3.3 (L) 08/14/2018 0321   ALBUMIN 2.9 (L) 09/13/2014 1541    RADIOGRAPHIC STUDIES: Dg Chest 2 View  Result Date: 08/12/2018 CLINICAL DATA:  Short of breath, history of COPD and asthma EXAM: CHEST - 2 VIEW COMPARISON:  Chest x-ray of 01/25/2018 FINDINGS: Cardiomegaly is present with interstitial markings bilaterally and probable small effusions most consistent with mild CHF. No focal pneumonia is seen. Loculated fluid cannot be excluded posteriorly at the lung base on the lateral view. Median sternotomy sutures are present from prior CABG and cardiomegaly is stable. Multiple thoracic vertebral augmentations are noted. IMPRESSION: 1. Cardiomegaly with prominent interstitial markings and small effusions most consistent with mild CHF. 2. Cannot exclude some loculated fluid posteriorly at the lung base on the lateral view. Electronically Signed   By: Ivar Drape M.D.   On: 08/12/2018 13:41   Dg Pelvis 1-2 Views  Result Date: 08/12/2018 CLINICAL DATA:  Chronic hip pain on the left, fell on October 2019 with pelvic fracture EXAM: PELVIS - 1-2 VIEW COMPARISON:  MR pelvis of 06/23/2018 FINDINGS: Fractures of the left inferior pubic ramus are identified which are subacute. No acute hip fracture is seen.  Changes of avascular necrosis are present in the right femoral head with the left femoral head changes as noted by MR not as well visualized by plain film. The SI joints appear well corticated. Vertebral augmentation is noted at the L4 level. IMPRESSION: 1. No acute hip fracture. 2. Subacute fractures of the left inferior pubic ramus. 3. Avascular necrosis of the right femoral head as noted on recent MR. The right femoral head involvement of AVN noted by MR is not seen by plain film. Electronically Signed   By: Ivar Drape M.D.   On: 08/12/2018 14:56   Ct Angio Chest Pe W And/or Wo Contrast  Result Date: 08/12/2018 CLINICAL DATA:  Worsening shortness of breath, history of COPD and asthma, recent fall injuring the pelvis EXAM: CT ANGIOGRAPHY CHEST WITH CONTRAST TECHNIQUE: Multidetector CT imaging of the chest was performed using the standard protocol during bolus administration of intravenous contrast. Multiplanar CT image reconstructions and MIPs were obtained to evaluate the vascular anatomy. CONTRAST:  31m OMNIPAQUE IOHEXOL 350 MG/ML SOLN COMPARISON:  Chest x-ray of 08/12/2018 FINDINGS: Cardiovascular: The pulmonary arteries are well opacified. There is no evidence of pulmonary embolism. There is faint opacification of the thoracic aorta with moderate thoracic aortic atherosclerosis present. Median sternotomy sutures are noted from prior CABG and cardiomegaly is present. No pericardial effusion is noted. Mediastinum/Nodes: There is evidence of mediastinal and hilar adenopathy particularly in the right infrahilar region and within the subcarinal mediastinum. Subcarinal adenopathy measures 24 mm. A pretracheal node on the right measures 11 mm with a right infrahilar node of 11 mm. Slightly prominent prevascular nodes also are present. A superior mediastinal lymph node on  image 44 series 5 measures 12 mm. The thyroid gland is unremarkable. The thoracic esophagus is unremarkable. Lungs/Pleura: On lung window  images, there is relatively severe centrilobular emphysema present with some paraseptal emphysema as well. Peripheral subpleural reticulation is present consistent with scarring. However, within the right lower lobe there is a rounded mass of 5.6 x 3.8 cm with a height 4.9 cm consistent with primary lung carcinoma. This in retrospect is vaguely visualized on chest x-ray today, superimposed upon chronic change and probable mild edema. There is some narrowing of the right lower lobe pulmonary artery branches which traverse this mass. There is postobstructive pneumonitis within the right lower lobe. Bilateral pleural effusions are present left larger than right. There is parenchymal opacity within the left lower lobe which could be related to atelectasis and scarring, but metastatic involvement of the left lower lobe cannot be excluded. With cardiomegaly, small effusions, and prominent interstitium, mild superimposed interstitial edema is a consideration. The opacity questioned on lateral chest x-ray posteriorly may represent loculated fluid. Upper Abdomen: On this CT of the chest, no hepatic lesion is seen. In view of the mass in the right lower lobe by CT, CT the abdomen pelvis with IV contrast may be helpful. The portion of the upper abdomen that is visualized is unremarkable. Musculoskeletal: There is slight curvature of the thoracic vertebrae conscious next to the right and several mid and lower thoracic vertebroplasties have been performed. Review of the MIP images confirms the above findings. IMPRESSION: 1. No evidence of acute pulmonary embolism. 2. However, there is a rounded mass of 5.6 x 3.8 x 4.9 cm within the right lower lobe consistent with primary lung carcinoma with postobstructive pneumonitis present. Mediastinal adenopathy. 3. Opacities in the posterior left lower lobe could represent pneumonia or possibly metastatic involvement of the left lung. 4. Small effusions left-greater-than-right with  cardiomegaly and somewhat prominent interstitial markings may represent mild interstitial edema superimposed on chronic change. 5. Centrilobular and to a lesser degree paraseptal emphysema. Electronically Signed   By: Ivar Drape M.D.   On: 08/12/2018 15:46    PERFORMANCE STATUS (ECOG) : 3 - Symptomatic, >50% confined to bed  Review of Systems As noted above. Otherwise, a complete review of systems is negative.  Physical Exam General: NAD, frail appearing Pulmonary: Unlabored, on O2 Extremities: no edema Skin: no rashes Neurological: Weakness but otherwise nonfocal  IMPRESSION: I met with patient, daughter, and son.  Prior to this hospitalization patient was living at home with a friend.  At baseline, patient is severely limited by her COPD.  She is essentially chair bound and requires assistance with all activities of daily living.  Patient and family were able to readily relate to me her current medical problems and findings of the work-up.  We discussed options for imaging and biopsy.  However, patient and family recognize that there is some inherent risk in biopsy and are not interested in that given that she is not a treatment candidate.  They verbalized a desire to forego further work-up and treatment.  We discussed the option of hospice care in detail including residential hospice or going home with hospice.  We also talked about the option of rehab or a nursing facility.  Patient clearly and adamantly expresses a desire not to be a nursing facility.  Note that she does reportedly have Medicaid pending as of January, which could provide her options for long-term care if needed.  Daughter expressed a desire to take her home to live with  a friend.  She wants to involve hospice in her care at home with a plan to transfer in the future to residential hospice should she decline.  Will consult care management to help facilitate disposition.  Patient is somewhat anxious.  She is on alprazolam  twice daily at home.  Will restart alprazolam inpatient.  Patient confirms that she is a DNR.  She says she has a DNR order signed at home.  PLAN: Care management consult for hospice at home DNR Continue supportive care   Time Total: 75 minutes  Visit consisted of counseling and education dealing with the complex and emotionally intense issues of symptom management and palliative care in the setting of serious and potentially life-threatening illness.Greater than 50%  of this time was spent counseling and coordinating care related to the above assessment and plan.  Signed by: Altha Harm, PhD, NP-C [phone] (Work Cell)

## 2018-08-14 NOTE — Care Management (Signed)
Hospice agency per CMS.gov list provided to patient and Troi for review.

## 2018-08-14 NOTE — Progress Notes (Signed)
Chaplain responded to an OR for an AD. Pt said she wish I ha come by when daughter was here. She then said she had an HCPOA. Chaplain offered prayer and Pt accepted. Chaplain prayed for care and comfort of the Pt.    08/14/18 1400  Clinical Encounter Type  Visited With Patient  Visit Type Spiritual support  Referral From Nurse  Spiritual Encounters  Spiritual Needs Brochure;Prayer

## 2018-08-14 NOTE — Progress Notes (Signed)
Pts bp 137/101. MD Posey Pronto notified.

## 2018-08-14 NOTE — Progress Notes (Signed)
Debra [name]   DOB:02/28/1933   ZH#:[id number]    Subjective: Patient states to have continued difficulty breathing.  Had difficulty sleeping at night because of multiple interruptions.  Positive for cough.  No nausea no vomiting no diarrhea.  Objective:  Vitals:   08/13/18 2346 08/14/18 0229  BP: (!) 178/74   Pulse: 81   Resp: (!) 22   Temp: 98.2 F (36.8 C)   SpO2: 96% 94%     Intake/Output Summary (Last 24 hours) at 08/14/2018 0829 Last data filed at 08/14/2018 4196 Gross per 24 hour  Intake 0 ml  Output 1500 ml  Net -1500 ml    Physical Exam  Constitutional: She is oriented to person, place, and time and well-developed, well-nourished, and in no distress.  Obese elderly female patient.  She is on 4 L of oxygen.  Mild conversational dyspnea.  Alone.  HENT:  Head: Normocephalic and atraumatic.  Mouth/Throat: Oropharynx is clear and moist. No oropharyngeal exudate.  Eyes: Pupils are equal, round, and reactive to light.  Neck: Normal range of motion. Neck supple.  Cardiovascular: Normal rate and regular rhythm.  Pulmonary/Chest: No respiratory distress. She has no wheezes.  Decreased air entry bilaterally.  Abdominal: Soft. Bowel sounds are normal. She exhibits no distension and no mass. There is no tenderness. There is no rebound and no guarding.  Musculoskeletal: Normal range of motion. She exhibits no edema or tenderness.  Neurological: She is alert and oriented to person, place, and time.  Skin: Skin is warm.  Psychiatric: Affect normal.      Labs:  Lab Results  Component Value Date   WBC 13.6 (H) 08/14/2018   HGB 9.3 (L) 08/14/2018   HCT 29.5 (L) 08/14/2018   MCV 103.1 (H) 08/14/2018   PLT 246 08/14/2018   NEUTROABS 5.4 06/23/2018    Lab Results  Component Value Date   NA 139 08/14/2018   K 4.2 08/14/2018   CL 100 08/14/2018   CO2 34 (H) 08/14/2018    Studies:  Dg Chest 2 View  Result Date: 08/12/2018 CLINICAL DATA:  Short of breath, history of COPD  and asthma EXAM: CHEST - 2 VIEW COMPARISON:  Chest x-ray of 01/25/2018 FINDINGS: Cardiomegaly is present with interstitial markings bilaterally and probable small effusions most consistent with mild CHF. No focal pneumonia is seen. Loculated fluid cannot be excluded posteriorly at the lung base on the lateral view. Median sternotomy sutures are present from prior CABG and cardiomegaly is stable. Multiple thoracic vertebral augmentations are noted. IMPRESSION: 1. Cardiomegaly with prominent interstitial markings and small effusions most consistent with mild CHF. 2. Cannot exclude some loculated fluid posteriorly at the lung base on the lateral view. Electronically Signed   By: Ivar Drape M.D.   On: 08/12/2018 13:41   Dg Pelvis 1-2 Views  Result Date: 08/12/2018 CLINICAL DATA:  Chronic hip pain on the left, fell on October 2019 with pelvic fracture EXAM: PELVIS - 1-2 VIEW COMPARISON:  MR pelvis of 06/23/2018 FINDINGS: Fractures of the left inferior pubic ramus are identified which are subacute. No acute hip fracture is seen. Changes of avascular necrosis are present in the right femoral head with the left femoral head changes as noted by MR not as well visualized by plain film. The SI joints appear well corticated. Vertebral augmentation is noted at the L4 level. IMPRESSION: 1. No acute hip fracture. 2. Subacute fractures of the left inferior pubic ramus. 3. Avascular necrosis of the right femoral head as noted on  recent MR. The right femoral head involvement of AVN noted by MR is not seen by plain film. Electronically Signed   By: Ivar Drape M.D.   On: 08/12/2018 14:56   Ct Angio Chest Pe W And/or Wo Contrast  Result Date: 08/12/2018 CLINICAL DATA:  Worsening shortness of breath, history of COPD and asthma, recent fall injuring the pelvis EXAM: CT ANGIOGRAPHY CHEST WITH CONTRAST TECHNIQUE: Multidetector CT imaging of the chest was performed using the standard protocol during bolus administration of  intravenous contrast. Multiplanar CT image reconstructions and MIPs were obtained to evaluate the vascular anatomy. CONTRAST:  10mL OMNIPAQUE IOHEXOL 350 MG/ML SOLN COMPARISON:  Chest x-ray of 08/12/2018 FINDINGS: Cardiovascular: The pulmonary arteries are well opacified. There is no evidence of pulmonary embolism. There is faint opacification of the thoracic aorta with moderate thoracic aortic atherosclerosis present. Median sternotomy sutures are noted from prior CABG and cardiomegaly is present. No pericardial effusion is noted. Mediastinum/Nodes: There is evidence of mediastinal and hilar adenopathy particularly in the right infrahilar region and within the subcarinal mediastinum. Subcarinal adenopathy measures 24 mm. A pretracheal node on the right measures 11 mm with a right infrahilar node of 11 mm. Slightly prominent prevascular nodes also are present. A superior mediastinal lymph node on image 44 series 5 measures 12 mm. The thyroid gland is unremarkable. The thoracic esophagus is unremarkable. Lungs/Pleura: On lung window images, there is relatively severe centrilobular emphysema present with some paraseptal emphysema as well. Peripheral subpleural reticulation is present consistent with scarring. However, within the right lower lobe there is a rounded mass of 5.6 x 3.8 cm with a height 4.9 cm consistent with primary lung carcinoma. This in retrospect is vaguely visualized on chest x-ray today, superimposed upon chronic change and probable mild edema. There is some narrowing of the right lower lobe pulmonary artery branches which traverse this mass. There is postobstructive pneumonitis within the right lower lobe. Bilateral pleural effusions are present left larger than right. There is parenchymal opacity within the left lower lobe which could be related to atelectasis and scarring, but metastatic involvement of the left lower lobe cannot be excluded. With cardiomegaly, small effusions, and prominent  interstitium, mild superimposed interstitial edema is a consideration. The opacity questioned on lateral chest x-ray posteriorly may represent loculated fluid. Upper Abdomen: On this CT of the chest, no hepatic lesion is seen. In view of the mass in the right lower lobe by CT, CT the abdomen pelvis with IV contrast may be helpful. The portion of the upper abdomen that is visualized is unremarkable. Musculoskeletal: There is slight curvature of the thoracic vertebrae conscious next to the right and several mid and lower thoracic vertebroplasties have been performed. Review of the MIP images confirms the above findings. IMPRESSION: 1. No evidence of acute pulmonary embolism. 2. However, there is a rounded mass of 5.6 x 3.8 x 4.9 cm within the right lower lobe consistent with primary lung carcinoma with postobstructive pneumonitis present. Mediastinal adenopathy. 3. Opacities in the posterior left lower lobe could represent pneumonia or possibly metastatic involvement of the left lung. 4. Small effusions left-greater-than-right with cardiomegaly and somewhat prominent interstitial markings may represent mild interstitial edema superimposed on chronic change. 5. Centrilobular and to a lesser degree paraseptal emphysema. Electronically Signed   By: Ivar Drape M.D.   On: 08/12/2018 15:46    Right lower lobe lung mass # 84 year old female patient with long-standing smoking history/COPD on 4 L of oxygen at baseline CHF admitted the hospital for  worsening shortness of breath noted to have incidental right lower lobe lung mass.  #Right lower lobe lung mass approximately 5 cm in size with postobstructive pneumonia mediastinal adenopathy highly suspicious for malignancy question involvement of the contralateral lung.  Given tenuous pulmonary function/advanced COPD-patient is high risk for procedural complications.  Discussed with IR.  And also patient poor candidate for any lung cancer treatment related modalities  -given poor performance status multiple comorbidities.  #Chronic respiratory failure-COPD/CHF-stable as per primary team.  #Mild anemia hemoglobin on 10-macrocytosis; LDH slightly elevated.  Normal Y92 folic acid.  Check LFTs haptoglobin to rule out hemolysis.  # Plan of care-again discussed with the daughter this morning regarding my above concerns.  Patient/patient's daughter seems to understand the difficult situation.  Daughter feels patient cannot go back to live with her niece in the trailer home.  Discussed with the daughter regarding palliative care evaluation/ agreeable.  Awaiting to speak to [facility] borders.  # CODE STATUS DNR/DNI.    Cammie Sickle, MD 08/14/2018  8:29 AM

## 2018-08-14 NOTE — Progress Notes (Signed)
[place] at [facility]                                                                                                                                                                                  Patient Demographics   Debra [name], is a 84 y.o. female, DOB - 06-01-1933, EYC:[id number]  Admit date - 08/12/2018   Admitting Physician Dustin Flock, MD  Outpatient Primary MD for the patient is Jodi Marble, MD   LOS - 2  Subjective: Patient continues to be very short of breath, has lower cough    Review of Systems:   CONSTITUTIONAL: No documented fever. No fatigue, weakness. No weight gain, no weight loss.  EYES: No blurry or double vision.  ENT: No tinnitus. No postnasal drip. No redness of the oropharynx.  RESPIRATORY: Positive cough, no wheeze, no hemoptysis.  Positive dyspnea.  CARDIOVASCULAR: No chest pain. No orthopnea. No palpitations. No syncope.  GASTROINTESTINAL: No nausea, no vomiting or diarrhea. No abdominal pain. No melena or hematochezia.  GENITOURINARY: No dysuria or hematuria.  ENDOCRINE: No polyuria or nocturia. No heat or cold intolerance.  HEMATOLOGY: No anemia. No bruising. No bleeding.  INTEGUMENTARY: No rashes. No lesions.  MUSCULOSKELETAL: No arthritis. No swelling. No gout.  NEUROLOGIC: No numbness, tingling, or ataxia. No seizure-type activity.  PSYCHIATRIC: No anxiety. No insomnia. No ADD.    Vitals:   Vitals:   08/13/18 2346 08/14/18 0229 08/14/18 0828 08/14/18 0833  BP: (!) 178/74   (!) 137/101  Pulse: 81   78  Resp: (!) 22     Temp: 98.2 F (36.8 C)   98 F (36.7 C)  TempSrc:    Oral  SpO2: 96% 94% 96% 99%  Weight:      Height:        Wt Readings from Last 3 Encounters:  08/12/18 76.9 kg  06/23/18 76.7 kg  01/25/18 74.4 kg     Intake/Output Summary (Last 24 hours) at 08/14/2018 1305 Last data filed at 08/14/2018 0649 Gross per 24 hour  Intake 0 ml  Output 1500 ml  Net -1500 ml    Physical  Exam:   GENERAL: Positive accessory muscle usage HEAD, EYES, EARS, NOSE AND THROAT: Atraumatic, normocephalic. Extraocular muscles are intact. Pupils equal and reactive to light. Sclerae anicteric. No conjunctival injection. No oro-pharyngeal erythema.  NECK: Supple. There is no jugular venous distention. No bruits, no lymphadenopathy, no thyromegaly.  HEART: Regular rate and rhythm,. No murmurs, no rubs, no clicks.  LUNGS: Accessory muscle usage with diffuse wheezing rhonchus breath sounds ABDOMEN: Soft, flat, nontender, nondistended. Has good bowel sounds. No hepatosplenomegaly appreciated.  EXTREMITIES: No evidence of any  cyanosis, clubbing, or peripheral edema.  +2 pedal and radial pulses bilaterally.  NEUROLOGIC: The patient is alert, awake, and oriented x3 with no focal motor or sensory deficits appreciated bilaterally.  SKIN: Moist and warm with no rashes appreciated.  Psych: Not anxious, depressed LN: No inguinal LN enlargement    Antibiotics   Anti-infectives (From admission, onward)   Start     Dose/Rate Route Frequency Ordered Stop   08/14/18 0429  ceFEPIme (MAXIPIME) 2 g in sodium chloride 0.9 % 100 mL IVPB  Status:  Discontinued     2 g 200 mL/hr over 30 Minutes Intravenous Every 24 hours 08/13/18 0652 08/13/18 1238   08/13/18 2200  ceFEPIme (MAXIPIME) 2 g in sodium chloride 0.9 % 100 mL IVPB     2 g 200 mL/hr over 30 Minutes Intravenous Every 12 hours 08/13/18 1238     08/13/18 1700  vancomycin (VANCOCIN) IVPB 1000 mg/200 mL premix     1,000 mg 200 mL/hr over 60 Minutes Intravenous Every 18 hours 08/13/18 1238     08/13/18 0430  ceFEPIme (MAXIPIME) 2 g in sodium chloride 0.9 % 100 mL IVPB  Status:  Discontinued     2 g 200 mL/hr over 30 Minutes Intravenous Every 12 hours 08/12/18 1705 08/13/18 0652   08/12/18 2300  vancomycin (VANCOCIN) IVPB 1000 mg/200 mL premix  Status:  Discontinued     1,000 mg 200 mL/hr over 60 Minutes Intravenous Every 24 hours 08/12/18 1727  08/13/18 1238   08/12/18 1615  vancomycin (VANCOCIN) IVPB 1000 mg/200 mL premix     1,000 mg 200 mL/hr over 60 Minutes Intravenous  Once 08/12/18 1613 08/12/18 1808   08/12/18 1615  ceFEPIme (MAXIPIME) 2 g in sodium chloride 0.9 % 100 mL IVPB     2 g 200 mL/hr over 30 Minutes Intravenous  Once 08/12/18 1613 08/12/18 1704      Medications   Scheduled Meds: . acetylcysteine  4 mL Oral BID  . Chlorhexidine Gluconate Cloth  6 each Topical Q0600  . guaiFENesin  600 mg Oral BID  . ipratropium-albuterol  3 mL Nebulization Q6H  . methylPREDNISolone (SOLU-MEDROL) injection  60 mg Intravenous Q6H  . mupirocin ointment  1 application Nasal BID  . sodium chloride flush  3 mL Intravenous Q12H   Continuous Infusions: . sodium chloride    . ceFEPime (MAXIPIME) IV 2 g (08/14/18 1039)  . vancomycin 1,000 mg (08/14/18 1247)   PRN Meds:.sodium chloride, acetaminophen **OR** acetaminophen, ALPRAZolam, hydrALAZINE, HYDROcodone-acetaminophen, morphine injection, ondansetron **OR** ondansetron (ZOFRAN) IV, sodium chloride flush   Data Review:   Micro Results Recent Results (from the past 240 hour(s))  MRSA PCR Screening     Status: Abnormal   Collection Time: 08/13/18  9:39 AM  Result Value Ref Range Status   MRSA by PCR POSITIVE (A) NEGATIVE Final    Comment:        The GeneXpert MRSA Assay (FDA approved for NASAL specimens only), is one component of a comprehensive MRSA colonization surveillance program. It is not intended to diagnose MRSA infection nor to guide or monitor treatment for MRSA infections. RESULT CALLED TO, READ BACK BY AND VERIFIED WITH: KRISTEN NELSON 08/13/18 118 KLW Performed at [facility] [address], [place],  [address]     Radiology Reports Dg Chest 2 View  Result Date: 08/12/2018 CLINICAL DATA:  Short of breath, history of COPD and asthma EXAM: CHEST - 2 VIEW COMPARISON:  Chest x-ray of 01/25/2018 FINDINGS: Cardiomegaly is present  with  interstitial markings bilaterally and probable small effusions most consistent with mild CHF. No focal pneumonia is seen. Loculated fluid cannot be excluded posteriorly at the lung base on the lateral view. Median sternotomy sutures are present from prior CABG and cardiomegaly is stable. Multiple thoracic vertebral augmentations are noted. IMPRESSION: 1. Cardiomegaly with prominent interstitial markings and small effusions most consistent with mild CHF. 2. Cannot exclude some loculated fluid posteriorly at the lung base on the lateral view. Electronically Signed   By: Ivar Drape M.D.   On: 08/12/2018 13:41   Dg Pelvis 1-2 Views  Result Date: 08/12/2018 CLINICAL DATA:  Chronic hip pain on the left, fell on October 2019 with pelvic fracture EXAM: PELVIS - 1-2 VIEW COMPARISON:  MR pelvis of 06/23/2018 FINDINGS: Fractures of the left inferior pubic ramus are identified which are subacute. No acute hip fracture is seen. Changes of avascular necrosis are present in the right femoral head with the left femoral head changes as noted by MR not as well visualized by plain film. The SI joints appear well corticated. Vertebral augmentation is noted at the L4 level. IMPRESSION: 1. No acute hip fracture. 2. Subacute fractures of the left inferior pubic ramus. 3. Avascular necrosis of the right femoral head as noted on recent MR. The right femoral head involvement of AVN noted by MR is not seen by plain film. Electronically Signed   By: Ivar Drape M.D.   On: 08/12/2018 14:56   Ct Angio Chest Pe W And/or Wo Contrast  Result Date: 08/12/2018 CLINICAL DATA:  Worsening shortness of breath, history of COPD and asthma, recent fall injuring the pelvis EXAM: CT ANGIOGRAPHY CHEST WITH CONTRAST TECHNIQUE: Multidetector CT imaging of the chest was performed using the standard protocol during bolus administration of intravenous contrast. Multiplanar CT image reconstructions and MIPs were obtained to evaluate the vascular anatomy.  CONTRAST:  61mL OMNIPAQUE IOHEXOL 350 MG/ML SOLN COMPARISON:  Chest x-ray of 08/12/2018 FINDINGS: Cardiovascular: The pulmonary arteries are well opacified. There is no evidence of pulmonary embolism. There is faint opacification of the thoracic aorta with moderate thoracic aortic atherosclerosis present. Median sternotomy sutures are noted from prior CABG and cardiomegaly is present. No pericardial effusion is noted. Mediastinum/Nodes: There is evidence of mediastinal and hilar adenopathy particularly in the right infrahilar region and within the subcarinal mediastinum. Subcarinal adenopathy measures 24 mm. A pretracheal node on the right measures 11 mm with a right infrahilar node of 11 mm. Slightly prominent prevascular nodes also are present. A superior mediastinal lymph node on image 44 series 5 measures 12 mm. The thyroid gland is unremarkable. The thoracic esophagus is unremarkable. Lungs/Pleura: On lung window images, there is relatively severe centrilobular emphysema present with some paraseptal emphysema as well. Peripheral subpleural reticulation is present consistent with scarring. However, within the right lower lobe there is a rounded mass of 5.6 x 3.8 cm with a height 4.9 cm consistent with primary lung carcinoma. This in retrospect is vaguely visualized on chest x-ray today, superimposed upon chronic change and probable mild edema. There is some narrowing of the right lower lobe pulmonary artery branches which traverse this mass. There is postobstructive pneumonitis within the right lower lobe. Bilateral pleural effusions are present left larger than right. There is parenchymal opacity within the left lower lobe which could be related to atelectasis and scarring, but metastatic involvement of the left lower lobe cannot be excluded. With cardiomegaly, small effusions, and prominent interstitium, mild superimposed interstitial edema is a consideration. The  opacity questioned on lateral chest x-ray  posteriorly may represent loculated fluid. Upper Abdomen: On this CT of the chest, no hepatic lesion is seen. In view of the mass in the right lower lobe by CT, CT the abdomen pelvis with IV contrast may be helpful. The portion of the upper abdomen that is visualized is unremarkable. Musculoskeletal: There is slight curvature of the thoracic vertebrae conscious next to the right and several mid and lower thoracic vertebroplasties have been performed. Review of the MIP images confirms the above findings. IMPRESSION: 1. No evidence of acute pulmonary embolism. 2. However, there is a rounded mass of 5.6 x 3.8 x 4.9 cm within the right lower lobe consistent with primary lung carcinoma with postobstructive pneumonitis present. Mediastinal adenopathy. 3. Opacities in the posterior left lower lobe could represent pneumonia or possibly metastatic involvement of the left lung. 4. Small effusions left-greater-than-right with cardiomegaly and somewhat prominent interstitial markings may represent mild interstitial edema superimposed on chronic change. 5. Centrilobular and to a lesser degree paraseptal emphysema. Electronically Signed   By: Ivar Drape M.D.   On: 08/12/2018 15:46     CBC Recent Labs  Lab 08/12/18 1254 08/13/18 0435 08/14/18 0321  WBC 8.5 6.9 13.6*  HGB 10.5* 10.1* 9.3*  HCT 33.9* 33.2* 29.5*  PLT 236 228 246  MCV 105.3* 106.1* 103.1*  MCH 32.6 32.3 32.5  MCHC 31.0 30.4 31.5  RDW 15.0 15.0 15.0    Chemistries  Recent Labs  Lab 08/12/18 1254 08/13/18 0435 08/14/18 0321  NA 140 138 139  K 4.3 4.3 4.2  CL 98 101 100  CO2 33* 33* 34*  GLUCOSE 117* 170* 159*  BUN 14 13 20   CREATININE 1.06* 0.89 0.90  CALCIUM 9.3 8.9 8.9  AST  --   --  15  ALT  --   --  7  ALKPHOS  --   --  80  BILITOT  --   --  0.6   ------------------------------------------------------------------------------------------------------------------ estimated creatinine clearance is 44.4 mL/min (by C-G formula  based on SCr of 0.9 mg/dL). ------------------------------------------------------------------------------------------------------------------ No results for input(s): HGBA1C in the last 72 hours. ------------------------------------------------------------------------------------------------------------------ No results for input(s): CHOL, HDL, LDLCALC, TRIG, CHOLHDL, LDLDIRECT in the last 72 hours. ------------------------------------------------------------------------------------------------------------------ No results for input(s): TSH, T4TOTAL, T3FREE, THYROIDAB in the last 72 hours.  Invalid input(s): FREET3 ------------------------------------------------------------------------------------------------------------------ Recent Labs    08/13/18 1419  VITAMINB12 381  FOLATE 9.6  RETICCTPCT 2.8    Coagulation profile Recent Labs  Lab 08/12/18 1254  INR 1.07    No results for input(s): DDIMER in the last 72 hours.  Cardiac Enzymes Recent Labs  Lab 08/12/18 1254  TROPONINI <0.03   ------------------------------------------------------------------------------------------------------------------ Invalid input(s): POCBNP    Assessment & Plan   IMPRESSION AND PLAN: Patient is 84 year old with COPD presenting with worsening shortness of breath  1.  Acute on chronic respiratory failure this is related to right lower lung mass associated with some pneumonia: Patient symptoms worse more oxygen now I discussed the case with oncology they do not feel that getting a biopsy will change her current management, discussed with the patient and son explained them that pulmonary status is worst, and biopsy can cause further complications Josh from pallative care team see today   2.  Postobstructive pneumonia continue current antibiotic with IV cefepime DC vancomycin  3.  Acute on chronic COPD exacerbation we will treat with nebulizer steroids and antibiotics as above  4.   Hyperlipidemia continue Lipitor  5.  Coronary artery  disease continue Plavix and Lopressor  6.  Miscellaneous SCDs for DVT prophylaxis      Code Status Orders  (From admission, onward)         Start     Ordered   08/12/18 2015  Do not attempt resuscitation (DNR)  Continuous    Question Answer Comment  In the event of cardiac or respiratory ARREST Do not call a "code blue"   In the event of cardiac or respiratory ARREST Do not perform Intubation, CPR, defibrillation or ACLS   In the event of cardiac or respiratory ARREST Use medication by any route, position, wound care, and other measures to relive pain and suffering. May use oxygen, suction and manual treatment of airway obstruction as needed for comfort.      08/12/18 2014        Code Status History    Date Active Date Inactive Code Status Order ID Comments User Context   06/23/2018 1846 06/25/2018 2120 DNR [id number]  [name], MD Inpatient   01/25/2018 1810 01/27/2018 1940 DNR [id number]  [name], MD Inpatient   12/22/2017 1631 12/28/2017 2218 DNR [id number]  Demetrios Loll, MD Inpatient   12/05/2016 1102 12/08/2016 1724 DNR [id number]  Hillary Bow, MD ED   10/05/2016 2302 10/10/2016 2354 Full Code [id number]  Lance Coon, MD Inpatient   06/10/2016 1032 06/10/2016 1050 Full Code [id number]  Theodoro Grist, MD Inpatient   09/14/2015 2019 09/16/2015 2034 Full Code [id number]  Theodoro Grist, MD Inpatient   08/27/2015 0805 09/01/2015 2042 Full Code [id number]  Harrie Foreman, MD Inpatient           Consults oncology  DVT Prophylaxis  Lovenox  Lab Results  Component Value Date   PLT 246 08/14/2018     Time Spent in minutes   35 minutes of critical care time spent patient with worsening respiratory failure greater than 50% of time spent in care coordination and counseling patient regarding the condition and plan of care.   Dustin Flock M.D on 08/14/2018 at 1:05 PM  Between 7am to 6pm - Pager -  [phone]  After 6pm go to [email or web address] - Proofreader  [employer]   Office  [phone]

## 2018-08-14 NOTE — Assessment & Plan Note (Addendum)
#   84 year old female patient with long-standing smoking history/COPD on 4 L of oxygen at baseline CHF admitted the hospital for worsening shortness of breath noted to have incidental right lower lobe lung mass.  #Right lower lobe lung mass approximately 5 cm in size with postobstructive pneumonia mediastinal adenopathy highly suspicious for malignancy question involvement of the contralateral lung.  Poor candidate for any treatment option given her comorbidities/lung function; and also at high risk for biopsy given her tenuous lung function.  See discussion below  #Chronic respiratory failure-COPD/CHF; improved.  As per primary team.  #Mild anemia hemoglobin on 10-macrocytosis; LDH slightly elevated.  Question MDS versus other reasons.  See discussion below  # CODE STATUS DNR/DNI.  Given multiple comorbidities-poor functional status-after multiple discussion we decided to hold off any further work-up.  Patient/family interested in hospice.  Patient to be discharged home on hospice today.  Spoke to patient's daughter Berlene who states oxygen/hospital bed was delivered.  However she had concerns about oxygen tank; this was relayed to social worker was going to reach hospice nurse.  Also discussed with Josh borders.

## 2018-08-15 LAB — CREATININE, SERUM
Creatinine, Ser: 0.82 mg/dL (ref 0.44–1.00)
GFR calc Af Amer: >60 mL/min (ref >60)

## 2018-08-15 LAB — HAPTOGLOBIN: Haptoglobin: 83 mg/dL (ref 34–200)

## 2018-08-15 NOTE — Care Management (Signed)
RNCM spoke with daughter Jackqulyn by phone to follow up on hospice agency offer. She has selected [facility]. She states she will need O2 (at min 5 liters and hospital bed, She has a bedside commode at home. She is not prepared to take patient home today. She states someone named Kennyth Lose told her that she couldn't get a hospital bed until 08/25/18. RNCM has notified Santiago Glad with [facility]. RNCM will follow.  Patient may need EMS to home- packet started. DNR will need to be included- this RNCM will check.

## 2018-08-15 NOTE — Plan of Care (Signed)
Patient going home tomorrow with hospice; constipation relieved  today with bowel movement this morning. Patient medicated for pain as needed. Patient eating well, able to turn and reposition self in bed, but still weak and unable to ambulate. Family at bedside during the day to visit with patient.

## 2018-08-15 NOTE — Consult Note (Signed)
Pharmacy Antibiotic Note  Debra [name] is a 84 y.o. female admitted on 08/12/2018 with pneumonia.  Pharmacy has been consulted for Cefepime dosing. SCr has improved since admission back to apparent baseline. She is currently on 5L [place] O2 with sats in the low 90s although there is some improvement noted in her breathing. WBC is slightly elevated this morning, possibly due to the IV steroids. This is day 4 of IV antibiotics. The plan for her is to go home with hospice tomorrow.  Plan: Continue cefepime at 2g IV q 12h   Height: 5\' 1"  (154.9 cm) Weight: 169 lb 9.6 oz (76.9 kg) IBW/kg (Calculated) : 47.8  Temp (24hrs), Avg:98.1 F (36.7 C), Min:97.9 F (36.6 C), Max:98.3 F (36.8 C)  Recent Labs  Lab 08/12/18 1254 08/13/18 0435 08/14/18 0321 08/15/18 0421  WBC 8.5 6.9 13.6*  --   CREATININE 1.06* 0.89 0.90 0.82    Estimated Creatinine Clearance: 48.7 mL/min (by C-G formula based on SCr of 0.82 mg/dL).    Allergies  Allergen Reactions  . Codeine Itching and Rash    Antimicrobials this admission: Vancomycin 12/03 >> 12/5  Cefepime 12/03 >>   Microbiology results: 12/4 MRSA PCR(+)  Thank you for allowing pharmacy to be a part of this patient's care.  Dallie Piles, PharmD Clinical Pharmacist 08/15/2018 11:44 AM

## 2018-08-15 NOTE — Progress Notes (Signed)
[place] at [facility]                                                                                                                                                                                  Patient Demographics   Debra [name], is a 84 y.o. female, DOB - 24-Jan-1933, CZY:[id number]  Admit date - 08/12/2018   Admitting Physician Dustin Flock, MD  Outpatient Primary MD for the patient is Jodi Marble, MD   LOS - 3  Subjective: Some improvement in her breathing    Review of Systems:   CONSTITUTIONAL: No documented fever. No fatigue, weakness. No weight gain, no weight loss.  EYES: No blurry or double vision.  ENT: No tinnitus. No postnasal drip. No redness of the oropharynx.  RESPIRATORY: Positive cough, no wheeze, no hemoptysis.  Positive dyspnea.  CARDIOVASCULAR: No chest pain. No orthopnea. No palpitations. No syncope.  GASTROINTESTINAL: No nausea, no vomiting or diarrhea. No abdominal pain. No melena or hematochezia.  GENITOURINARY: No dysuria or hematuria.  ENDOCRINE: No polyuria or nocturia. No heat or cold intolerance.  HEMATOLOGY: No anemia. No bruising. No bleeding.  INTEGUMENTARY: No rashes. No lesions.  MUSCULOSKELETAL: No arthritis. No swelling. No gout.  NEUROLOGIC: No numbness, tingling, or ataxia. No seizure-type activity.  PSYCHIATRIC: No anxiety. No insomnia. No ADD.    Vitals:   Vitals:   08/14/18 2329 08/15/18 0020 08/15/18 0204 08/15/18 0804  BP: (!) 171/84 (!) 144/56  (!) 153/85  Pulse: 82   79  Resp: 16   18  Temp: 97.9 F (36.6 C)   98.3 F (36.8 C)  TempSrc: Oral   Oral  SpO2: 99%  95% 91%  Weight:      Height:        Wt Readings from Last 3 Encounters:  08/12/18 76.9 kg  06/23/18 76.7 kg  01/25/18 74.4 kg     Intake/Output Summary (Last 24 hours) at 08/15/2018 1257 Last data filed at 08/15/2018 0600 Gross per 24 hour  Intake 100 ml  Output 1350 ml  Net -1250 ml    Physical Exam:    GENERAL: Positive accessory muscle usage HEAD, EYES, EARS, NOSE AND THROAT: Atraumatic, normocephalic. Extraocular muscles are intact. Pupils equal and reactive to light. Sclerae anicteric. No conjunctival injection. No oro-pharyngeal erythema.  NECK: Supple. There is no jugular venous distention. No bruits, no lymphadenopathy, no thyromegaly.  HEART: Regular rate and rhythm,. No murmurs, no rubs, no clicks.  LUNGS: Accessory muscle usage with diffuse wheezing rhonchus breath sounds ABDOMEN: Soft, flat, nontender, nondistended. Has good bowel sounds. No hepatosplenomegaly appreciated.  EXTREMITIES: No evidence of any cyanosis, clubbing, or peripheral edema.  +  2 pedal and radial pulses bilaterally.  NEUROLOGIC: The patient is alert, awake, and oriented x3 with no focal motor or sensory deficits appreciated bilaterally.  SKIN: Moist and warm with no rashes appreciated.  Psych: Not anxious, depressed LN: No inguinal LN enlargement    Antibiotics   Anti-infectives (From admission, onward)   Start     Dose/Rate Route Frequency Ordered Stop   08/14/18 0429  ceFEPIme (MAXIPIME) 2 g in sodium chloride 0.9 % 100 mL IVPB  Status:  Discontinued     2 g 200 mL/hr over 30 Minutes Intravenous Every 24 hours 08/13/18 0652 08/13/18 1238   08/13/18 2200  ceFEPIme (MAXIPIME) 2 g in sodium chloride 0.9 % 100 mL IVPB     2 g 200 mL/hr over 30 Minutes Intravenous Every 12 hours 08/13/18 1238     08/13/18 1700  vancomycin (VANCOCIN) IVPB 1000 mg/200 mL premix  Status:  Discontinued     1,000 mg 200 mL/hr over 60 Minutes Intravenous Every 18 hours 08/13/18 1238 08/14/18 1530   08/13/18 0430  ceFEPIme (MAXIPIME) 2 g in sodium chloride 0.9 % 100 mL IVPB  Status:  Discontinued     2 g 200 mL/hr over 30 Minutes Intravenous Every 12 hours 08/12/18 1705 08/13/18 0652   08/12/18 2300  vancomycin (VANCOCIN) IVPB 1000 mg/200 mL premix  Status:  Discontinued     1,000 mg 200 mL/hr over 60 Minutes Intravenous Every  24 hours 08/12/18 1727 08/13/18 1238   08/12/18 1615  vancomycin (VANCOCIN) IVPB 1000 mg/200 mL premix     1,000 mg 200 mL/hr over 60 Minutes Intravenous  Once 08/12/18 1613 08/12/18 1808   08/12/18 1615  ceFEPIme (MAXIPIME) 2 g in sodium chloride 0.9 % 100 mL IVPB     2 g 200 mL/hr over 30 Minutes Intravenous  Once 08/12/18 1613 08/12/18 1704      Medications   Scheduled Meds: . Chlorhexidine Gluconate Cloth  6 each Topical Q0600  . guaiFENesin  600 mg Oral BID  . ipratropium-albuterol  3 mL Nebulization Q6H  . methylPREDNISolone (SOLU-MEDROL) injection  60 mg Intravenous Q6H  . mupirocin ointment  1 application Nasal BID  . sodium chloride flush  3 mL Intravenous Q12H   Continuous Infusions: . sodium chloride    . ceFEPime (MAXIPIME) IV 2 g (08/15/18 1100)   PRN Meds:.sodium chloride, acetaminophen **OR** acetaminophen, ALPRAZolam, bisacodyl, hydrALAZINE, HYDROcodone-acetaminophen, morphine injection, ondansetron **OR** ondansetron (ZOFRAN) IV, sodium chloride flush   Data Review:   Micro Results Recent Results (from the past 240 hour(s))  MRSA PCR Screening     Status: Abnormal   Collection Time: 08/13/18  9:39 AM  Result Value Ref Range Status   MRSA by PCR POSITIVE (A) NEGATIVE Final    Comment:        The GeneXpert MRSA Assay (FDA approved for NASAL specimens only), is one component of a comprehensive MRSA colonization surveillance program. It is not intended to diagnose MRSA infection nor to guide or monitor treatment for MRSA infections. RESULT CALLED TO, READ BACK BY AND VERIFIED WITH: KRISTEN NELSON 08/13/18 118 KLW Performed at [facility], [address] [place], [place] [address]     Radiology Reports Dg Chest 2 View  Result Date: 08/12/2018 CLINICAL DATA:  Short of breath, history of COPD and asthma EXAM: CHEST - 2 VIEW COMPARISON:  Chest x-ray of 01/25/2018 FINDINGS: Cardiomegaly is present with interstitial markings bilaterally and  probable small effusions most consistent with mild CHF. No focal  pneumonia is seen. Loculated fluid cannot be excluded posteriorly at the lung base on the lateral view. Median sternotomy sutures are present from prior CABG and cardiomegaly is stable. Multiple thoracic vertebral augmentations are noted. IMPRESSION: 1. Cardiomegaly with prominent interstitial markings and small effusions most consistent with mild CHF. 2. Cannot exclude some loculated fluid posteriorly at the lung base on the lateral view. Electronically Signed   By: Ivar Drape M.D.   On: 08/12/2018 13:41   Dg Pelvis 1-2 Views  Result Date: 08/12/2018 CLINICAL DATA:  Chronic hip pain on the left, fell on October 2019 with pelvic fracture EXAM: PELVIS - 1-2 VIEW COMPARISON:  MR pelvis of 06/23/2018 FINDINGS: Fractures of the left inferior pubic ramus are identified which are subacute. No acute hip fracture is seen. Changes of avascular necrosis are present in the right femoral head with the left femoral head changes as noted by MR not as well visualized by plain film. The SI joints appear well corticated. Vertebral augmentation is noted at the L4 level. IMPRESSION: 1. No acute hip fracture. 2. Subacute fractures of the left inferior pubic ramus. 3. Avascular necrosis of the right femoral head as noted on recent MR. The right femoral head involvement of AVN noted by MR is not seen by plain film. Electronically Signed   By: Ivar Drape M.D.   On: 08/12/2018 14:56   Ct Angio Chest Pe W And/or Wo Contrast  Result Date: 08/12/2018 CLINICAL DATA:  Worsening shortness of breath, history of COPD and asthma, recent fall injuring the pelvis EXAM: CT ANGIOGRAPHY CHEST WITH CONTRAST TECHNIQUE: Multidetector CT imaging of the chest was performed using the standard protocol during bolus administration of intravenous contrast. Multiplanar CT image reconstructions and MIPs were obtained to evaluate the vascular anatomy. CONTRAST:  91mL OMNIPAQUE IOHEXOL 350  MG/ML SOLN COMPARISON:  Chest x-ray of 08/12/2018 FINDINGS: Cardiovascular: The pulmonary arteries are well opacified. There is no evidence of pulmonary embolism. There is faint opacification of the thoracic aorta with moderate thoracic aortic atherosclerosis present. Median sternotomy sutures are noted from prior CABG and cardiomegaly is present. No pericardial effusion is noted. Mediastinum/Nodes: There is evidence of mediastinal and hilar adenopathy particularly in the right infrahilar region and within the subcarinal mediastinum. Subcarinal adenopathy measures 24 mm. A pretracheal node on the right measures 11 mm with a right infrahilar node of 11 mm. Slightly prominent prevascular nodes also are present. A superior mediastinal lymph node on image 44 series 5 measures 12 mm. The thyroid gland is unremarkable. The thoracic esophagus is unremarkable. Lungs/Pleura: On lung window images, there is relatively severe centrilobular emphysema present with some paraseptal emphysema as well. Peripheral subpleural reticulation is present consistent with scarring. However, within the right lower lobe there is a rounded mass of 5.6 x 3.8 cm with a height 4.9 cm consistent with primary lung carcinoma. This in retrospect is vaguely visualized on chest x-ray today, superimposed upon chronic change and probable mild edema. There is some narrowing of the right lower lobe pulmonary artery branches which traverse this mass. There is postobstructive pneumonitis within the right lower lobe. Bilateral pleural effusions are present left larger than right. There is parenchymal opacity within the left lower lobe which could be related to atelectasis and scarring, but metastatic involvement of the left lower lobe cannot be excluded. With cardiomegaly, small effusions, and prominent interstitium, mild superimposed interstitial edema is a consideration. The opacity questioned on lateral chest x-ray posteriorly may represent loculated fluid.  Upper Abdomen: On this  CT of the chest, no hepatic lesion is seen. In view of the mass in the right lower lobe by CT, CT the abdomen pelvis with IV contrast may be helpful. The portion of the upper abdomen that is visualized is unremarkable. Musculoskeletal: There is slight curvature of the thoracic vertebrae conscious next to the right and several mid and lower thoracic vertebroplasties have been performed. Review of the MIP images confirms the above findings. IMPRESSION: 1. No evidence of acute pulmonary embolism. 2. However, there is a rounded mass of 5.6 x 3.8 x 4.9 cm within the right lower lobe consistent with primary lung carcinoma with postobstructive pneumonitis present. Mediastinal adenopathy. 3. Opacities in the posterior left lower lobe could represent pneumonia or possibly metastatic involvement of the left lung. 4. Small effusions left-greater-than-right with cardiomegaly and somewhat prominent interstitial markings may represent mild interstitial edema superimposed on chronic change. 5. Centrilobular and to a lesser degree paraseptal emphysema. Electronically Signed   By: Ivar Drape M.D.   On: 08/12/2018 15:46     CBC Recent Labs  Lab 08/12/18 1254 08/13/18 0435 08/14/18 0321  WBC 8.5 6.9 13.6*  HGB 10.5* 10.1* 9.3*  HCT 33.9* 33.2* 29.5*  PLT 236 228 246  MCV 105.3* 106.1* 103.1*  MCH 32.6 32.3 32.5  MCHC 31.0 30.4 31.5  RDW 15.0 15.0 15.0    Chemistries  Recent Labs  Lab 08/12/18 1254 08/13/18 0435 08/14/18 0321 08/15/18 0421  NA 140 138 139  --   K 4.3 4.3 4.2  --   CL 98 101 100  --   CO2 33* 33* 34*  --   GLUCOSE 117* 170* 159*  --   BUN 14 13 20   --   CREATININE 1.06* 0.89 0.90 0.82  CALCIUM 9.3 8.9 8.9  --   AST  --   --  15  --   ALT  --   --  7  --   ALKPHOS  --   --  80  --   BILITOT  --   --  0.6  --    ------------------------------------------------------------------------------------------------------------------ estimated creatinine clearance is  48.7 mL/min (by C-G formula based on SCr of 0.82 mg/dL). ------------------------------------------------------------------------------------------------------------------ No results for input(s): HGBA1C in the last 72 hours. ------------------------------------------------------------------------------------------------------------------ No results for input(s): CHOL, HDL, LDLCALC, TRIG, CHOLHDL, LDLDIRECT in the last 72 hours. ------------------------------------------------------------------------------------------------------------------ No results for input(s): TSH, T4TOTAL, T3FREE, THYROIDAB in the last 72 hours.  Invalid input(s): FREET3 ------------------------------------------------------------------------------------------------------------------ Recent Labs    08/13/18 1419  VITAMINB12 381  FOLATE 9.6  RETICCTPCT 2.8    Coagulation profile Recent Labs  Lab 08/12/18 1254  INR 1.07    No results for input(s): DDIMER in the last 72 hours.  Cardiac Enzymes Recent Labs  Lab 08/12/18 1254  TROPONINI <0.03   ------------------------------------------------------------------------------------------------------------------ Invalid input(s): POCBNP    Assessment & Plan   IMPRESSION AND PLAN: Patient is 84 year old with COPD presenting with worsening shortness of breath  1.  Acute on chronic respiratory failure this is related to right lower lung mass associated with some pneumonia: Patient symptoms worse more oxygen now I discussed the case with oncology they do not feel that getting a biopsy will change her current management, discussed with the patient and son explained them that pulmonary status is worst, and biopsy can cause further complications Josh from pallative care has seen the patient, plan for her is to go home with hospice, they are planning to get a hospital bed to her by tomorrow Oncology no  plan for any further work-up for the mass  2.   Postobstructive pneumonia continue current antibiotic with IV cefepime   3.  Acute on chronic COPD exacerbation we will treat with nebulizer steroids and antibiotics as above  4.  Hyperlipidemia continue Lipitor  5.  Coronary artery disease continue Plavix and Lopressor  6.  Miscellaneous SCDs for DVT prophylaxis      Code Status Orders  (From admission, onward)         Start     Ordered   08/12/18 2015  Do not attempt resuscitation (DNR)  Continuous    Question Answer Comment  In the event of cardiac or respiratory ARREST Do not call a "code blue"   In the event of cardiac or respiratory ARREST Do not perform Intubation, CPR, defibrillation or ACLS   In the event of cardiac or respiratory ARREST Use medication by any route, position, wound care, and other measures to relive pain and suffering. May use oxygen, suction and manual treatment of airway obstruction as needed for comfort.      08/12/18 2014        Code Status History    Date Active Date Inactive Code Status Order ID Comments User Context   06/23/2018 1846 06/25/2018 2120 DNR [id number]  [name], MD Inpatient   01/25/2018 1810 01/27/2018 1940 DNR [id number]  [name], MD Inpatient   12/22/2017 1631 12/28/2017 2218 DNR [id number]  Demetrios Loll, MD Inpatient   12/05/2016 1102 12/08/2016 1724 DNR [id number]  Hillary Bow, MD ED   10/05/2016 2302 10/10/2016 2354 Full Code [id number]  Lance Coon, MD Inpatient   06/10/2016 1032 06/10/2016 1050 Full Code [id number]  Theodoro Grist, MD Inpatient   09/14/2015 2019 09/16/2015 2034 Full Code [id number]  Theodoro Grist, MD Inpatient   08/27/2015 0805 09/01/2015 2042 Full Code [id number]  Harrie Foreman, MD Inpatient           Consults oncology  DVT Prophylaxis  Lovenox  Lab Results  Component Value Date   PLT 246 08/14/2018     Time Spent in minutes   35 minutes of critical care time spent patient with worsening respiratory failure greater than  50% of time spent in care coordination and counseling patient regarding the condition and plan of care.   Dustin Flock M.D on 08/15/2018 at 12:57 PM  Between 7am to 6pm - Pager - [phone]  After 6pm go to [email or web address] - Proofreader  [employer]   Office  [phone]

## 2018-08-15 NOTE — Care Management (Signed)
Address updated on EMS transport: [place]

## 2018-08-15 NOTE — Care Management Important Message (Signed)
Important Message  Patient Details  Name: Debra [name] MRN: [id number] Date of Birth: August 20, 1933   Medicare Important Message Given:  Yes    Marshell Garfinkel, RN 08/15/2018, 11:59 AM

## 2018-08-15 NOTE — Progress Notes (Signed)
[place]  [phone] [phone]   Name: CAREN GARSKE Date: 08/15/2018 MRN: [id number]  DOB: [birth year]  Patient Care Team: Jodi Marble, MD as PCP - General (Internal Medicine) Alisa Graff, FNP as Nurse Practitioner (Family Medicine) Dionisio David, MD as Consulting Physician (Cardiology) Laverle Hobby, MD as Consulting Physician (Pulmonary Disease) Telford Nab, RN as Registered Nurse    REASON FOR CONSULTATION: Palliative Care consult requested for this 84 y.o. female with multiple medical problems including O2 dependent COPD, chronic diastolic dysfunction with history of CHF, CAD, who was admitted on 08/12/2018 with acute on chronic respiratory failure.  CTA of the chest revealed right lower lung mass with probable postobstructive pneumonia.  Patient has been evaluated by oncology.  She is felt not to be a candidate for aggressive treatment.  Palliative care was consulted to help address goals.  CODE STATUS: DNR  PAST MEDICAL HISTORY: Past Medical History:  Diagnosis Date  . Asthma   . CHF (congestive heart failure) ([place])   . COPD (chronic obstructive pulmonary disease) ([place])   . Myocardial infarction [facility])     PAST SURGICAL HISTORY:  Past Surgical History:  Procedure Laterality Date  . ABDOMINAL HYSTERECTOMY    . BREAST SURGERY    . BYPASS GRAFT  2007   triple  . CHOLECYSTECTOMY    . CORONARY ARTERY BYPASS GRAFT    . TONSILLECTOMY      HEMATOLOGY/ONCOLOGY HISTORY:   No history exists.    ALLERGIES:  is allergic to codeine.  MEDICATIONS:  Current Facility-Administered Medications  Medication Dose Route Frequency Provider Last Rate Last Dose  . 0.9 %  sodium chloride infusion  250 mL Intravenous PRN Dustin Flock, MD      . acetaminophen (TYLENOL) tablet 650 mg  650 mg Oral Q6H PRN Dustin Flock, MD       Or  . acetaminophen (TYLENOL) suppository 650 mg  650 mg  Rectal Q6H PRN Dustin Flock, MD      . ALPRAZolam Duanne Moron) tablet 0.5 mg  0.5 mg Oral BID PRN Borders, Kirt Boys, NP   0.5 mg at 08/14/18 2112  . bisacodyl (DULCOLAX) suppository 10 mg  10 mg Rectal Daily PRN Nicholes Mango, MD   10 mg at 08/15/18 0658  . ceFEPIme (MAXIPIME) 2 g in sodium chloride 0.9 % 100 mL IVPB  2 g Intravenous Q12H Dallie Piles, RPH 200 mL/hr at 08/15/18 1100 2 g at 08/15/18 1100  . Chlorhexidine Gluconate Cloth 2 % PADS 6 each  6 each Topical Q0600 Dustin Flock, MD   6 each at 08/15/18 0555  . guaiFENesin (MUCINEX) 12 hr tablet 600 mg  600 mg Oral BID Dustin Flock, MD   600 mg at 08/15/18 1059  . hydrALAZINE (APRESOLINE) injection 10 mg  10 mg Intravenous Q6H PRN Dustin Flock, MD   10 mg at 08/14/18 2336  . HYDROcodone-acetaminophen (NORCO/VICODIN) 5-325 MG per tablet 1 tablet  1 tablet Oral Q4H PRN Lance Coon, MD   1 tablet at 08/15/18 1142  . ipratropium-albuterol (DUONEB) 0.5-2.5 (3) MG/3ML nebulizer solution 3 mL  3 mL Nebulization Q6H Dustin Flock, MD   3 mL at 08/15/18 0833  . methylPREDNISolone sodium succinate (SOLU-MEDROL) 125 mg/2 mL injection 60 mg  60 mg Intravenous Q6H Dustin Flock, MD   60 mg at 08/15/18 1106  . morphine 2 MG/ML injection 2 mg  2 mg Intravenous Q4H PRN Dustin Flock, MD      .  mupirocin ointment (BACTROBAN) 2 % 1 application  1 application Nasal BID Dustin Flock, MD   1 application at 09/60/45 1100  . ondansetron (ZOFRAN) tablet 4 mg  4 mg Oral Q6H PRN Dustin Flock, MD       Or  . ondansetron [facility]) injection 4 mg  4 mg Intravenous Q6H PRN Dustin Flock, MD      . sodium chloride flush (NS) 0.9 % injection 3 mL  3 mL Intravenous Q12H Dustin Flock, MD   3 mL at 08/15/18 1100  . sodium chloride flush (NS) 0.9 % injection 3 mL  3 mL Intravenous PRN Dustin Flock, MD        VITAL SIGNS: BP (!) 153/85 (BP Location: Left Arm)   Pulse 79   Temp 98.3 F (36.8 C) (Oral)   Resp 18   Ht 5\' 1"  (1.549 m)   Wt 169  lb 9.6 oz (76.9 kg)   SpO2 91% Comment: MD aware  BMI 32.05 kg/m  Filed Weights   08/12/18 1248 08/12/18 2033  Weight: 164 lb (74.4 kg) 169 lb 9.6 oz (76.9 kg)    Estimated body mass index is 32.05 kg/m as calculated from the following:   Height as of this encounter: 5\' 1"  (1.549 m).   Weight as of this encounter: 169 lb 9.6 oz (76.9 kg).  LABS: CBC:    Component Value Date/Time   WBC 13.6 (H) 08/14/2018 0321   HGB 9.3 (L) 08/14/2018 0321   HGB 11.7 (L) 09/14/2014 0416   HGB 14.4 07/24/2007 0904   HCT 29.5 (L) 08/14/2018 0321   HCT 35.0 09/14/2014 0416   HCT 42.3 07/24/2007 0904   PLT 246 08/14/2018 0321   PLT 159 09/14/2014 0416   PLT 259 07/24/2007 0904   MCV 103.1 (H) 08/14/2018 0321   MCV 107 (H) 09/14/2014 0416   MCV 104 (H) 07/24/2007 0904   NEUTROABS 5.4 06/23/2018 1118   NEUTROABS 4.0 09/14/2014 0416   NEUTROABS 3.7 07/24/2007 0904   LYMPHSABS 1.7 06/23/2018 1118   LYMPHSABS 0.7 (L) 09/14/2014 0416   LYMPHSABS 2.9 07/24/2007 0904   MONOABS 0.5 06/23/2018 1118   MONOABS 0.0 (L) 09/14/2014 0416   EOSABS 0.7 (H) 06/23/2018 1118   EOSABS 0.0 09/14/2014 0416   EOSABS 0.2 07/24/2007 0904   BASOSABS 0.0 06/23/2018 1118   BASOSABS 0.0 09/14/2014 0416   BASOSABS 0.0 07/24/2007 0904   Comprehensive Metabolic Panel:    Component Value Date/Time   NA 139 08/14/2018 0321   NA 140 09/17/2014 0506   NA 136 12/03/2006 1253   K 4.2 08/14/2018 0321   K 3.8 09/17/2014 0506   K 5.2 (H) 12/03/2006 1253   CL 100 08/14/2018 0321   CL 98 09/17/2014 0506   CL 102 12/03/2006 1253   CO2 34 (H) 08/14/2018 0321   CO2 41 (HH) 09/17/2014 0506   CO2 32 12/03/2006 1253   BUN 20 08/14/2018 0321   BUN 20 (H) 09/17/2014 0506   BUN 19 12/03/2006 1253   CREATININE 0.82 08/15/2018 0421   CREATININE 1.09 09/17/2014 0506   CREATININE 1.0 12/03/2006 1253   GLUCOSE 159 (H) 08/14/2018 0321   GLUCOSE 97 09/17/2014 0506   GLUCOSE 101 12/03/2006 1253   CALCIUM 8.9 08/14/2018 0321    CALCIUM 8.6 09/17/2014 0506   CALCIUM 9.1 12/03/2006 1253   AST 15 08/14/2018 0321   AST 20 09/13/2014 1541   ALT 7 08/14/2018 0321   ALT 14 09/13/2014 1541   ALKPHOS 80  08/14/2018 0321   ALKPHOS 49 09/13/2014 1541   BILITOT 0.6 08/14/2018 0321   BILITOT 0.4 09/13/2014 1541   PROT 7.3 08/14/2018 0321   PROT 6.2 (L) 09/13/2014 1541   ALBUMIN 3.3 (L) 08/14/2018 0321   ALBUMIN 2.9 (L) 09/13/2014 1541    RADIOGRAPHIC STUDIES: Dg Chest 2 View  Result Date: 08/12/2018 CLINICAL DATA:  Short of breath, history of COPD and asthma EXAM: CHEST - 2 VIEW COMPARISON:  Chest x-ray of 01/25/2018 FINDINGS: Cardiomegaly is present with interstitial markings bilaterally and probable small effusions most consistent with mild CHF. No focal pneumonia is seen. Loculated fluid cannot be excluded posteriorly at the lung base on the lateral view. Median sternotomy sutures are present from prior CABG and cardiomegaly is stable. Multiple thoracic vertebral augmentations are noted. IMPRESSION: 1. Cardiomegaly with prominent interstitial markings and small effusions most consistent with mild CHF. 2. Cannot exclude some loculated fluid posteriorly at the lung base on the lateral view. Electronically Signed   By: Ivar Drape M.D.   On: 08/12/2018 13:41   Dg Pelvis 1-2 Views  Result Date: 08/12/2018 CLINICAL DATA:  Chronic hip pain on the left, fell on October 2019 with pelvic fracture EXAM: PELVIS - 1-2 VIEW COMPARISON:  MR pelvis of 06/23/2018 FINDINGS: Fractures of the left inferior pubic ramus are identified which are subacute. No acute hip fracture is seen. Changes of avascular necrosis are present in the right femoral head with the left femoral head changes as noted by MR not as well visualized by plain film. The SI joints appear well corticated. Vertebral augmentation is noted at the L4 level. IMPRESSION: 1. No acute hip fracture. 2. Subacute fractures of the left inferior pubic ramus. 3. Avascular necrosis of the  right femoral head as noted on recent MR. The right femoral head involvement of AVN noted by MR is not seen by plain film. Electronically Signed   By: Ivar Drape M.D.   On: 08/12/2018 14:56   Ct Angio Chest Pe W And/or Wo Contrast  Result Date: 08/12/2018 CLINICAL DATA:  Worsening shortness of breath, history of COPD and asthma, recent fall injuring the pelvis EXAM: CT ANGIOGRAPHY CHEST WITH CONTRAST TECHNIQUE: Multidetector CT imaging of the chest was performed using the standard protocol during bolus administration of intravenous contrast. Multiplanar CT image reconstructions and MIPs were obtained to evaluate the vascular anatomy. CONTRAST:  31mL OMNIPAQUE IOHEXOL 350 MG/ML SOLN COMPARISON:  Chest x-ray of 08/12/2018 FINDINGS: Cardiovascular: The pulmonary arteries are well opacified. There is no evidence of pulmonary embolism. There is faint opacification of the thoracic aorta with moderate thoracic aortic atherosclerosis present. Median sternotomy sutures are noted from prior CABG and cardiomegaly is present. No pericardial effusion is noted. Mediastinum/Nodes: There is evidence of mediastinal and hilar adenopathy particularly in the right infrahilar region and within the subcarinal mediastinum. Subcarinal adenopathy measures 24 mm. A pretracheal node on the right measures 11 mm with a right infrahilar node of 11 mm. Slightly prominent prevascular nodes also are present. A superior mediastinal lymph node on image 44 series 5 measures 12 mm. The thyroid gland is unremarkable. The thoracic esophagus is unremarkable. Lungs/Pleura: On lung window images, there is relatively severe centrilobular emphysema present with some paraseptal emphysema as well. Peripheral subpleural reticulation is present consistent with scarring. However, within the right lower lobe there is a rounded mass of 5.6 x 3.8 cm with a height 4.9 cm consistent with primary lung carcinoma. This in retrospect is vaguely visualized on chest  x-ray  today, superimposed upon chronic change and probable mild edema. There is some narrowing of the right lower lobe pulmonary artery branches which traverse this mass. There is postobstructive pneumonitis within the right lower lobe. Bilateral pleural effusions are present left larger than right. There is parenchymal opacity within the left lower lobe which could be related to atelectasis and scarring, but metastatic involvement of the left lower lobe cannot be excluded. With cardiomegaly, small effusions, and prominent interstitium, mild superimposed interstitial edema is a consideration. The opacity questioned on lateral chest x-ray posteriorly may represent loculated fluid. Upper Abdomen: On this CT of the chest, no hepatic lesion is seen. In view of the mass in the right lower lobe by CT, CT the abdomen pelvis with IV contrast may be helpful. The portion of the upper abdomen that is visualized is unremarkable. Musculoskeletal: There is slight curvature of the thoracic vertebrae conscious next to the right and several mid and lower thoracic vertebroplasties have been performed. Review of the MIP images confirms the above findings. IMPRESSION: 1. No evidence of acute pulmonary embolism. 2. However, there is a rounded mass of 5.6 x 3.8 x 4.9 cm within the right lower lobe consistent with primary lung carcinoma with postobstructive pneumonitis present. Mediastinal adenopathy. 3. Opacities in the posterior left lower lobe could represent pneumonia or possibly metastatic involvement of the left lung. 4. Small effusions left-greater-than-right with cardiomegaly and somewhat prominent interstitial markings may represent mild interstitial edema superimposed on chronic change. 5. Centrilobular and to a lesser degree paraseptal emphysema. Electronically Signed   By: Ivar Drape M.D.   On: 08/12/2018 15:46    PERFORMANCE STATUS (ECOG) : 4 - Bedbound  Review of Systems As noted above. Otherwise, a complete review of  systems is negative.  Physical Exam General: NAD, frail appearing Pulmonary: unlabored, on O2 Abdomen: soft, nontender, + bowel sounds Extremities: no edema Skin: no rashes Neurological: Weakness but otherwise nonfocal  IMPRESSION: Patient comfortable appearing.  She has no acute complaints or concerns today.  She asked about arranging a hospital bed to facilitate her discharge from hospital.  Appreciate care management consult.  Hospice has been consulted to help arrange services at home.  No family currently present.  PLAN: Continue supportive care Home with hospice when medically ready   Patient expressed understanding and was in agreement with this plan. She also understands that She can call clinic at any time with any questions, concerns, or complaints.     Time Total: 15 minutes  Visit consisted of counseling and education dealing with the complex and emotionally intense issues of symptom management and palliative care in the setting of serious and potentially life-threatening illness.Greater than 50%  of this time was spent counseling and coordinating care related to the above assessment and plan.  Signed by: Altha Harm, PhD, NP-C [phone] (Work Cell)

## 2018-08-15 NOTE — Progress Notes (Signed)
Chaplain responded to request by care team. Chaplain executed HCPOA for Pt.    08/15/18 1400  Clinical Encounter Type  Visited With Patient and family together  Visit Type Follow-up  Referral From Care management  Spiritual Encounters  Spiritual Needs Brochure

## 2018-08-15 NOTE — Progress Notes (Signed)
New referral for [facility] services at home received from [facility] following a Palliative Medicine Consult. Patient is an 84 year old woman with a  PMH of COPD oxygen dependent, chronic diastolic dysfunction with history of CHF, CAD, MI, who was admitted on 08/12/2018 with acute on chronic respiratory failure.  CT of the chest revealed right lower lung mass with probable postobstructive pneumonia. She has been evaluated by oncology and is not a candidate for any therapy/intervention. Palliative Medicine was consulted for goals of care and met with patient and her daughter Tinesha, they have chosen to discharge home with the support of hospice services. Writer met in the room with patient and Lavonna to initiate education regarding hospice services, philosophy and team approach to care with understanding voiced. Patient is currently requiring 5 liters of oxygen and scheduled nebulizer treatments. DME needs discussed, patient will need a hospital bed and oxygen. DME ordered for delivery later today with planned discharge home tomorrow via EMS and signed portable DNR in place. Hospice information and contact numbers given to [facility]. Patient information faxed to referral. Please note patient will be discharging to: [place]. CMRN Marshell Garfinkel made aware. Thank you. Flo Shanks RN, BSN, [place] [facility] [place], hospital Liaison [phone]

## 2018-08-16 DIAGNOSIS — J440 Chronic obstructive pulmonary disease with acute lower respiratory infection: Secondary | ICD-10-CM

## 2018-08-16 DIAGNOSIS — Z9981 Dependence on supplemental oxygen: Secondary | ICD-10-CM

## 2018-08-16 DIAGNOSIS — J9610 Chronic respiratory failure, unspecified whether with hypoxia or hypercapnia: Secondary | ICD-10-CM

## 2018-08-16 DIAGNOSIS — J188 Other pneumonia, unspecified organism: Secondary | ICD-10-CM

## 2018-08-16 LAB — CREATININE, SERUM
Creatinine, Ser: 0.92 mg/dL (ref 0.44–1.00)
GFR calc Af Amer: >60 mL/min (ref >60)
GFR calc non Af Amer: 58 mL/min — ABNORMAL LOW (ref >60)

## 2018-08-16 MED ORDER — CEFUROXIME AXETIL 500 MG PO TABS: 500.0000 mg | ORAL_TABLET | Freq: Two times a day (BID) | ORAL | 0 refills | Status: AC

## 2018-08-16 MED ORDER — CEFDINIR 300 MG PO CAPS
300.0000 mg | ORAL_CAPSULE | Freq: Two times a day (BID) | ORAL | Status: DC
  Administered 2018-08-16: 300 mg via ORAL
  Filled 2018-08-16 (×2): qty 1

## 2018-08-16 MED ORDER — PREDNISONE 10 MG PO TABS: ORAL_TABLET | ORAL | 0 refills | Status: AC

## 2018-08-16 NOTE — Progress Notes (Signed)
Patient d/c home. Family updated. EMS called, waiting on transport.

## 2018-08-16 NOTE — Care Management Note (Signed)
Case Management Note  Patient Details  Name: Debra [name] MRN: [id number] Date of Birth: Nov 23, 1932  Subjective/Objective:   Patient to discharge to [facility]. Patient to travel via EMS. DME delivered yesterday. Confirmed discharge with Caryl Pina at [facility].              Action/Plan:   Expected Discharge Date:  08/16/18               Expected Discharge Plan:     In-House Referral:  Hospice / Palliative Care  Discharge planning Services  CM Consult  Post Acute Care Choice:  Hospice Choice offered to:  Patient, Adult Children  DME Arranged:    DME Agency:     HH Arranged:    [place] Agency:  [facility]  Status of Service:  Completed, signed off  If discussed at [place] of Stay Meetings, dates discussed:    Additional Comments:  Latanya Maudlin, RN 08/16/2018, 2:56 PM

## 2018-08-16 NOTE — Progress Notes (Signed)
Debra [name]   DOB:July 15, 1933   BS#:[id number]    Subjective: The patient states that she slept well last night.  States her breathing is improved.  She continues to be on folds of oxygen.  No hemoptysis.  Eager to go home.  Objective:  Vitals:   08/16/18 0151 08/16/18 0736  BP:  (!) 173/73  Pulse:  67  Resp:    Temp:  98.1 F (36.7 C)  SpO2: 98% 95%     Intake/Output Summary (Last 24 hours) at 08/16/2018 1603 Last data filed at 08/16/2018 0616 Gross per 24 hour  Intake -  Output 500 ml  Net -500 ml    Physical Exam  Constitutional: She is oriented to person, place, and time and well-developed, well-nourished, and in no distress.  Obese elderly female patient.  She is on 4 L of oxygen.  Mild conversational dyspnea.  Alone.  HENT:  Head: Normocephalic and atraumatic.  Mouth/Throat: Oropharynx is clear and moist. No oropharyngeal exudate.  Eyes: Pupils are equal, round, and reactive to light.  Neck: Normal range of motion. Neck supple.  Cardiovascular: Normal rate and regular rhythm.  Pulmonary/Chest: No respiratory distress. She has no wheezes.  Decreased air entry bilaterally.  Abdominal: Soft. Bowel sounds are normal. She exhibits no distension and no mass. There is no tenderness. There is no rebound and no guarding.  Musculoskeletal: Normal range of motion. She exhibits no edema or tenderness.  Neurological: She is alert and oriented to person, place, and time.  Skin: Skin is warm.  Psychiatric: Affect normal.      Labs:  Lab Results  Component Value Date   WBC 13.6 (H) 08/14/2018   HGB 9.3 (L) 08/14/2018   HCT 29.5 (L) 08/14/2018   MCV 103.1 (H) 08/14/2018   PLT 246 08/14/2018   NEUTROABS 5.4 06/23/2018    Lab Results  Component Value Date   NA 139 08/14/2018   K 4.2 08/14/2018   CL 100 08/14/2018   CO2 34 (H) 08/14/2018    Studies:  No results found.  Lung mass # 84 year old female patient with long-standing smoking history/COPD on 4 L of oxygen at  baseline CHF admitted the hospital for worsening shortness of breath noted to have incidental right lower lobe lung mass.  #Right lower lobe lung mass approximately 5 cm in size with postobstructive pneumonia mediastinal adenopathy highly suspicious for malignancy question involvement of the contralateral lung.  Poor candidate for any treatment option given her comorbidities/lung function; and also at high risk for biopsy given her tenuous lung function.  See discussion below  #Chronic respiratory failure-COPD/CHF; improved.  As per primary team.  #Mild anemia hemoglobin on 10-macrocytosis; LDH slightly elevated.  Question MDS versus other reasons.  See discussion below  # CODE STATUS DNR/DNI.  Given multiple comorbidities-poor functional status-after multiple discussion we decided to hold off any further work-up.  Patient/family interested in hospice.  Patient to be discharged home on hospice today.  Spoke to patient's daughter Cheronda who states oxygen/hospital bed was delivered.  However she had concerns about oxygen tank; this was relayed to social worker was going to reach hospice nurse.  Also discussed with Josh borders.    Cammie Sickle, MD 08/16/2018  4:03 PM

## 2018-08-16 NOTE — Discharge Summary (Signed)
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 Asthma   . CHF (congestive heart failure) ([place])   . COPD (chronic obstructive pulmonary disease) ([place])   . Myocardial infarction [facility])     HOSPITAL COURSE:  84 year old with COPD presenting with worsening shortness of breath  1.Acute on chronic respiratory failure this is related to right lower lung mass associated with some pneumonia: Patient symptoms worse more oxygen now - discussed the case with oncology they do not feel that getting a biopsy will change her current management -his family is aware that her pulmonary status is worst, and biopsy can cause further complications -Josh from pallative care has seen the patient, plan for her is to go home with hospice, they have a hospital bed and oxygen -per oncology Dr. Rogue Bussing no plan for any further work-up for the mass  2.Postobstructive pneumonia continue current antibiotic with IV cefepime -- change to oral Ceftin  3.Acute on chronic COPD exacerbation we will treat with nebulizer steroids and antibiotics as above -continue nebulizer, oxygen, inhalers and steroid taper  4.Hyperlipidemia continue Lipitor  5.Coronary artery disease continue  Plavix and Lopressor  confirmed with case Freight forwarder. Patient is okay to go home with hospice. She will go by ambulance.  CONSULTS OBTAINED:  Treatment Team:  Earlie Server, MD  DRUG ALLERGIES:   Allergies  Allergen Reactions  . Codeine Itching and Rash    DISCHARGE MEDICATIONS:   Allergies as of 08/16/2018      Reactions   Codeine Itching, Rash      Medication List    TAKE these medications   albuterol 108 (90 Base) MCG/ACT inhaler Commonly known as:  PROVENTIL HFA;VENTOLIN HFA Inhale 2 puffs into the lungs every 4 (four) hours as needed for wheezing or shortness of breath.   ALPRAZolam 0.5 MG tablet Commonly known as:  XANAX Take 1 tablet (0.5 mg total) by mouth 2 (two) times daily. What changed:  when to take this   atorvastatin 10 MG tablet Commonly known as:  LIPITOR Take 10 mg by mouth at bedtime.   azelastine 0.1 % nasal spray Commonly known as:  ASTELIN Place 1 spray into both nostrils daily.   cefUROXime 500 MG tablet Commonly known as:  CEFTIN Take 1 tablet (500 mg total) by mouth 2 (two) times daily for 5 days.   clopidogrel 75 MG tablet Commonly known as:  PLAVIX Take 75 mg by mouth daily.   diclofenac sodium 1 % Gel Commonly known as:  VOLTAREN Apply 1 g topically 2 (two) times daily. Apply to knees and back   docusate sodium 100 MG capsule Commonly known as:  COLACE Take 1 capsule (100 mg total) by mouth daily as needed for mild constipation.   feeding supplement (ENSURE ENLIVE) Liqd Take 237 mLs by mouth 2 (two) times daily between meals.  FLUoxetine 20 MG capsule Commonly known as:  PROZAC Take 1 capsule (20 mg total) by mouth daily.   fluticasone furoate-vilanterol 100-25 MCG/INH Aepb Commonly known as:  BREO ELLIPTA Inhale 1 puff into the lungs daily.   HYDROcodone-acetaminophen 5-325 MG tablet Commonly known as:  NORCO/VICODIN Take 1 tablet by mouth every 6 (six) hours as needed for moderate pain.   ipratropium-albuterol 0.5-2.5 (3)  MG/3ML Soln Commonly known as:  DUONEB Take 3 mLs by nebulization every 6 (six) hours as needed. What changed:  reasons to take this   latanoprost 0.005 % ophthalmic solution Commonly known as:  XALATAN Place 1 drop into both eyes at bedtime.   meclizine 12.5 MG tablet Commonly known as:  ANTIVERT Take 1 tablet (12.5 mg total) by mouth 3 (three) times daily as needed for dizziness.   metoprolol tartrate 25 MG tablet Commonly known as:  LOPRESSOR Take 1 tablet (25 mg total) by mouth at bedtime.   OS-CAL CALCIUM + D3 500-200 MG-UNIT Tabs Generic drug:  Calcium Carb-Cholecalciferol Take 1 tablet by mouth 2 (two) times daily.   pantoprazole 40 MG tablet Commonly known as:  PROTONIX Take 40 mg by mouth daily.   predniSONE 10 MG tablet Commonly known as:  DELTASONE Take 50 mg daily taper by 10 mg daily and then stop   senna-docusate 8.6-50 MG tablet Commonly known as:  Senokot-S Take 1 tablet by mouth at bedtime as needed for mild constipation.   tiotropium 18 MCG inhalation capsule Commonly known as:  SPIRIVA Place 1 capsule (18 mcg total) into inhaler and inhale every morning.   traZODone 50 MG tablet Commonly known as:  DESYREL Take 50-100 mg by mouth at bedtime as needed for sleep.       If you experience worsening of your admission symptoms, develop shortness of breath, life threatening emergency, suicidal or homicidal thoughts you must seek medical attention immediately by calling 911 or calling your MD immediately  if symptoms less severe.  You Must read complete instructions/literature along with all the possible adverse reactions/side effects for all the Medicines you take and that have been prescribed to you. Take any new Medicines after you have completely understood and accept all the possible adverse reactions/side effects.   Please note  You were cared for by a hospitalist during your hospital stay. If you have any questions about your discharge medications  or the care you received while you were in the hospital after you are discharged, you can call the unit and asked to speak with the hospitalist on call if the hospitalist that took care of you is not available. Once you are discharged, your primary care physician will handle any further medical issues. Please note that NO REFILLS for any discharge medications will be authorized once you are discharged, as it is imperative that you return to your primary care physician (or establish a relationship with a primary care physician if you do not have one) for your aftercare needs so that they can reassess your need for medications and monitor your lab values. Today   SUBJECTIVE   When can I go home ? No new complaints. Patient tells me hospice has delivered bed at home VITAL SIGNS:  Blood pressure (!) 173/73, pulse 67, temperature 98.1 F (36.7 C), temperature source Oral, resp. rate 18, height 5\' 1"  (1.549 m), weight 76.9 kg, SpO2 95 %.  I/O:    Intake/Output Summary (Last 24 hours) at 08/16/2018 1315 Last data filed at 08/16/2018 0616 Gross per  24 hour  Intake -  Output 500 ml  Net -500 ml    PHYSICAL EXAMINATION:  GENERAL:  84 y.o.-year-old patient lying in the bed with no acute distress.  EYES: Pupils equal, round, reactive to light and accommodation. No scleral icterus. Extraocular muscles intact.  HEENT: Head atraumatic, normocephalic. Oropharynx and nasopharynx clear.  NECK:  Supple, no jugular venous distention. No thyroid enlargement, no tenderness.  LUNGS: coarse breath sounds bilaterally, no wheezing, rales,rhonchi or crepitation. No use of accessory muscles of respiration. No respiratory distress CARDIOVASCULAR: S1, S2 normal. No murmurs, rubs, or gallops.  ABDOMEN: Soft, non-tender, non-distended. Bowel sounds present. No organomegaly or mass.  EXTREMITIES: No pedal edema, cyanosis, or clubbing.  NEUROLOGIC: Cranial nerves II through XII are intact. Muscle strength 5/5 in all  extremities. Sensation intact. Gait not checked.  PSYCHIATRIC: The patient is alert and oriented x 3.  SKIN: No obvious rash, lesion, or ulcer.   DATA REVIEW:   CBC  Recent Labs  Lab 08/14/18 0321  WBC 13.6*  HGB 9.3*  HCT 29.5*  PLT 246    Chemistries  Recent Labs  Lab 08/14/18 0321  08/16/18 0328  NA 139  --   --   K 4.2  --   --   CL 100  --   --   CO2 34*  --   --   GLUCOSE 159*  --   --   BUN 20  --   --   CREATININE 0.90   < > 0.92  CALCIUM 8.9  --   --   AST 15  --   --   ALT 7  --   --   ALKPHOS 80  --   --   BILITOT 0.6  --   --    < > = values in this interval not displayed.    Microbiology Results   Recent Results (from the past 240 hour(s))  MRSA PCR Screening     Status: Abnormal   Collection Time: 08/13/18  9:39 AM  Result Value Ref Range Status   MRSA by PCR POSITIVE (A) NEGATIVE Final    Comment:        The GeneXpert MRSA Assay (FDA approved for NASAL specimens only), is one component of a comprehensive MRSA colonization surveillance program. It is not intended to diagnose MRSA infection nor to guide or monitor treatment for MRSA infections. RESULT CALLED TO, READ BACK BY AND VERIFIED WITH: KRISTEN NELSON 08/13/18 118 KLW Performed at [facility], [address] [place], [place] [address]     RADIOLOGY:  No results found.   Management plans discussed with the patient, family and they are in agreement.  CODE STATUS:     Code Status Orders  (From admission, onward)         Start     Ordered   08/12/18 2015  Do not attempt resuscitation (DNR)  Continuous    Question Answer Comment  In the event of cardiac or respiratory ARREST Do not call a "code blue"   In the event of cardiac or respiratory ARREST Do not perform Intubation, CPR, defibrillation or ACLS   In the event of cardiac or respiratory ARREST Use medication by any route, position, wound care, and other measures to relive pain and suffering. May use oxygen,  suction and manual treatment of airway obstruction as needed for comfort.      08/12/18 2014        Code Status History  Date Active Date Inactive Code Status Order ID Comments User Context   06/23/2018 1846 06/25/2018 2120 DNR [id number]  [name], MD Inpatient   01/25/2018 1810 01/27/2018 1940 DNR [id number]  [name], MD Inpatient   12/22/2017 1631 12/28/2017 2218 DNR [id number]  Demetrios Loll, MD Inpatient   12/05/2016 1102 12/08/2016 1724 DNR [id number]  Hillary Bow, MD ED   10/05/2016 2302 10/10/2016 2354 Full Code [id number]  Lance Coon, MD Inpatient   06/10/2016 1032 06/10/2016 1050 Full Code [id number]  Theodoro Grist, MD Inpatient   09/14/2015 2019 09/16/2015 2034 Full Code [id number]  Theodoro Grist, MD Inpatient   08/27/2015 0805 09/01/2015 2042 Full Code [id number]  Harrie Foreman, MD Inpatient      TOTAL TIME TAKING CARE OF THIS PATIENT: *40* minutes.    Fritzi Mandes M.D on 08/16/2018 at 1:15 PM  Between 7am to 6pm - Pager - [phone] After 6pm go to [email or web address] - password [email or web address]  [facility]  Office  [phone]  CC: Primary care physician; Jodi Marble, MD

## 2018-08-17 ENCOUNTER — Other Ambulatory Visit: Payer: Self-pay | Admitting: Internal Medicine

## 2018-08-17 MED ORDER — HYDROCODONE-ACETAMINOPHEN 5-325 MG PO TABS: 1.0000 | ORAL_TABLET | Freq: Four times a day (QID) | ORAL | 0 refills | Status: AC | PRN

## 2018-08-17 NOTE — Progress Notes (Signed)
Called in script for vicodin as per daughter request. Pt under hospice.

## 2018-10-11 DEATH — deceased
